# Patient Record
Sex: Female | Born: 1940
Health system: Southern US, Community
[De-identification: ages and names within clinical notes are randomized; demographics above are authoritative.]

## PROBLEM LIST (undated history)

## (undated) DIAGNOSIS — Z8719 Personal history of other diseases of the digestive system: Secondary | ICD-10-CM

## (undated) DIAGNOSIS — F419 Anxiety disorder, unspecified: Secondary | ICD-10-CM

## (undated) DIAGNOSIS — F39 Unspecified mood [affective] disorder: Secondary | ICD-10-CM

## (undated) DIAGNOSIS — K259 Gastric ulcer, unspecified as acute or chronic, without hemorrhage or perforation: Secondary | ICD-10-CM

## (undated) DIAGNOSIS — C801 Malignant (primary) neoplasm, unspecified: Secondary | ICD-10-CM

## (undated) DIAGNOSIS — Z9289 Personal history of other medical treatment: Secondary | ICD-10-CM

## (undated) DIAGNOSIS — K219 Gastro-esophageal reflux disease without esophagitis: Secondary | ICD-10-CM

## (undated) DIAGNOSIS — I10 Essential (primary) hypertension: Secondary | ICD-10-CM

## (undated) DIAGNOSIS — Z87442 Personal history of urinary calculi: Secondary | ICD-10-CM

## (undated) DIAGNOSIS — F32A Depression, unspecified: Secondary | ICD-10-CM

## (undated) DIAGNOSIS — N951 Menopausal and female climacteric states: Secondary | ICD-10-CM

## (undated) DIAGNOSIS — F329 Major depressive disorder, single episode, unspecified: Secondary | ICD-10-CM

## (undated) DIAGNOSIS — M19049 Primary osteoarthritis, unspecified hand: Secondary | ICD-10-CM

## (undated) HISTORY — DX: Unspecified mood (affective) disorder: F39

## (undated) HISTORY — PX: EYE SURGERY: SHX253

## (undated) HISTORY — DX: Essential (primary) hypertension: I10

## (undated) HISTORY — PX: WISDOM TOOTH EXTRACTION: SHX21

## (undated) HISTORY — DX: Menopausal and female climacteric states: N95.1

## (undated) HISTORY — DX: Gastric ulcer, unspecified as acute or chronic, without hemorrhage or perforation: K25.9

## (undated) HISTORY — PX: UPPER GI ENDOSCOPY: SHX6162

## (undated) HISTORY — PX: TUBAL LIGATION: SHX77

## (undated) HISTORY — PX: OTHER SURGICAL HISTORY: SHX169

## (undated) HISTORY — DX: Primary osteoarthritis, unspecified hand: M19.049

## (undated) HISTORY — PX: DIAGNOSTIC LAPAROSCOPY: SUR761

---
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
## 2004-03-12 ENCOUNTER — Other Ambulatory Visit: Payer: Self-pay

## 2004-03-12 ENCOUNTER — Emergency Department: Payer: Self-pay | Admitting: Emergency Medicine

## 2005-02-03 ENCOUNTER — Other Ambulatory Visit: Payer: Self-pay

## 2005-02-03 ENCOUNTER — Inpatient Hospital Stay: Payer: Self-pay | Admitting: Internal Medicine

## 2006-04-23 HISTORY — PX: CHOLECYSTECTOMY: SHX55

## 2011-04-24 HISTORY — PX: COLONOSCOPY: SHX174

## 2011-04-24 LAB — HM COLONOSCOPY: HM COLON: NORMAL

## 2013-04-23 DIAGNOSIS — C801 Malignant (primary) neoplasm, unspecified: Secondary | ICD-10-CM

## 2013-04-23 HISTORY — DX: Malignant (primary) neoplasm, unspecified: C80.1

## 2013-04-23 HISTORY — PX: MELANOMA EXCISION: SHX5266

## 2013-05-05 ENCOUNTER — Telehealth: Payer: Self-pay

## 2013-05-05 NOTE — Telephone Encounter (Signed)
Pt has new pt medicare to establish appt on 07/28/13 with Dr Silvio Pate; pt has changed insurance and cannot go back to previous physician because he does not take pts ins. Pt is dizzy and difficulty walking due to dizziness; no CP,SOB or H/A.Advised pt to [place] caution not to fall and Advised pt to go to [facility] for this urgent need and pt speaking with Morey Hummingbird about being put on cancellation list for Dr Silvio Pate; pt voiced understanding.

## 2013-07-28 ENCOUNTER — Encounter: Payer: Self-pay | Admitting: Internal Medicine

## 2013-07-28 ENCOUNTER — Ambulatory Visit (INDEPENDENT_AMBULATORY_CARE_PROVIDER_SITE_OTHER): Payer: Commercial Managed Care - HMO | Admitting: Internal Medicine

## 2013-07-28 ENCOUNTER — Encounter: Payer: Self-pay | Admitting: Radiology

## 2013-07-28 VITALS — BP 120/80 | HR 78 | Temp 98.0°F | Ht 61.0 in | Wt 133.0 lb

## 2013-07-28 DIAGNOSIS — M159 Polyosteoarthritis, unspecified: Secondary | ICD-10-CM | POA: Insufficient documentation

## 2013-07-28 DIAGNOSIS — K259 Gastric ulcer, unspecified as acute or chronic, without hemorrhage or perforation: Secondary | ICD-10-CM | POA: Insufficient documentation

## 2013-07-28 DIAGNOSIS — F39 Unspecified mood [affective] disorder: Secondary | ICD-10-CM

## 2013-07-28 DIAGNOSIS — M19049 Primary osteoarthritis, unspecified hand: Secondary | ICD-10-CM

## 2013-07-28 DIAGNOSIS — N959 Unspecified menopausal and perimenopausal disorder: Secondary | ICD-10-CM

## 2013-07-28 DIAGNOSIS — I10 Essential (primary) hypertension: Secondary | ICD-10-CM

## 2013-07-28 DIAGNOSIS — N951 Menopausal and female climacteric states: Secondary | ICD-10-CM

## 2013-07-28 MED ORDER — ESTRADIOL 1 MG PO TABS: 0.5000 mg | ORAL_TABLET | Freq: Every day | ORAL | Status: DC

## 2013-07-28 MED ORDER — MEDROXYPROGESTERONE ACETATE 2.5 MG PO TABS: 2.5000 mg | ORAL_TABLET | Freq: Every day | ORAL | Status: DC

## 2013-07-28 MED ORDER — TRAMADOL HCL 50 MG PO TABS: 50.0000 mg | ORAL_TABLET | Freq: Two times a day (BID) | ORAL | Status: DC | PRN

## 2013-07-28 NOTE — Patient Instructions (Signed)
Please cut back on the estradiol ---- take 1/2 on odd days and full pill on even days for 2 months. If you do okay, cut back to 1/2 tab daily.

## 2013-07-28 NOTE — Assessment & Plan Note (Signed)
BP Readings from Last 3 Encounters:  07/28/13 120/80   Good control May be able to wean

## 2013-07-28 NOTE — Assessment & Plan Note (Signed)
Uses tramadol occasionally with limited success Tylenol also

## 2013-07-28 NOTE — Assessment & Plan Note (Signed)
Really attached to her hormone therapy Asked her to try to wean the estrogen though

## 2013-07-28 NOTE — Assessment & Plan Note (Signed)
Mostly anxiety now No longer depressed---has improved with her church and faith experience Uses the diazepam fairly regularly

## 2013-07-28 NOTE — Progress Notes (Signed)
Subjective:    Patient ID: Katie Bell, female    DOB: Apr 15, 1941, 73 y.o.   MRN: [id number]  HPI Establishing here--had been seen in [facility]  High blood pressure goes back to about age 56 Generally has tolerated the med Did have spell of dizziness in January-this is better now  Has trouble with nerves Takes valium most nights--usually cuts it in half Some degree of depression ---feels this is better now since she is involved with a church again  Has arthritis in hands DIP nodules and pain Uses tramadol---helps some Uses tylenol also---doesn't help much Will try olive oil to relax her at times as well  Has had ulcers since age 75 Has had bleeding and needed cautery via EGD Was on prilosec---now just on aloe vera gelcaps. Hasn't had recent symptoms No swallowing problems Last EGD about a year ago--healing ulcers. (GI with [facility])  No current outpatient prescriptions on file prior to visit.   No current facility-administered medications on file prior to visit.    Allergies  Allergen Reactions  . Morphine And Related Anxiety    Past Medical History  Diagnosis Date  . Hypertension   . Gastric ulcer   . Episodic mood disorder     mostly anxiety--depression in the past  . Kidney stone     1960's  . Osteoarthritis, hand   . Postmenopausal disorder     Past Surgical History  Procedure Laterality Date  . Tonsillectomy  1944  . Cholecystectomy  2008    Family History  Problem Relation Age of Onset  . Alzheimer's disease Mother   . Alcohol abuse Father   . COPD Brother   . Heart disease Neg Hx   . Cancer Neg Hx     History   Social History  . Marital Status: Divorced    Spouse Name: N/A    Number of Children: 3  . Years of Education: N/A   Occupational History  . LPN--retired     personal care on the side   Social History Main Topics  . Smoking status: Never Smoker   . Smokeless tobacco: Never Used  . Alcohol Use: No  . Drug  Use: No  . Sexual Activity: Not on file   Other Topics Concern  . Not on file   Social History Narrative   3 sons--- middle one lives with her   Goes by Katie Bell      No living will   Son Katie Bell should make decisions for her   Would accept resuscitation attempts but no prolonged ventilation   No tube feeds if cognitively unaware         Review of Systems  Constitutional: Negative for fatigue.       Weight up slightly  HENT: Negative for hearing loss and tinnitus.   Eyes: Negative for visual disturbance.       Recent eye exam and new glasses  Respiratory: Negative for cough, chest tightness and shortness of breath.   Cardiovascular: Negative for chest pain, palpitations and leg swelling.  Gastrointestinal: Negative for nausea, vomiting, abdominal pain and blood in stool.       No heartburn  Genitourinary: Negative for dysuria and hematuria.       Distant history of kidney stones--- has stopped sodas and improved diet  Musculoskeletal: Positive for arthralgias. Negative for joint swelling.       Heel spurs 2 years ago--better with cortisone shots  Allergic/Immunologic: Positive for environmental allergies. Negative for  immunocompromised state.       Uses flonase and some OTC antihistamines  Neurological: Positive for dizziness and headaches. Negative for syncope.       Rare headaches  Psychiatric/Behavioral: Positive for sleep disturbance. Negative for dysphoric mood. The patient is nervous/anxious.        Objective:   Physical Exam  Constitutional: She appears well-developed and well-nourished. No distress.  Neck: Normal range of motion. Neck supple. No thyromegaly present.  Cardiovascular: Normal rate, regular rhythm, normal heart sounds and intact distal pulses.  Exam reveals no gallop.   No murmur heard. Pulmonary/Chest: Effort normal and breath sounds normal. No respiratory distress. She has no wheezes. She has no rales.  Abdominal: Soft. There is no tenderness.    Musculoskeletal: She exhibits no edema and no tenderness.  Lymphadenopathy:    She has no cervical adenopathy.  Skin: No rash noted.  Psychiatric: She has a normal mood and affect. Her behavior is normal.          Assessment & Plan:

## 2013-07-28 NOTE — Progress Notes (Signed)
Pre visit review using our clinic review tool, if applicable. No additional management support is needed unless otherwise documented below in the visit note. 

## 2013-07-28 NOTE — Assessment & Plan Note (Signed)
Multiple bleeding ulcers Advised no ASA or NSAIDS No longer on PPI Will check records

## 2013-07-29 ENCOUNTER — Telehealth: Payer: Self-pay | Admitting: Internal Medicine

## 2013-07-29 NOTE — Telephone Encounter (Signed)
Relevant patient education mailed to patient.  

## 2013-08-25 ENCOUNTER — Telehealth: Payer: Self-pay

## 2013-08-25 NOTE — Telephone Encounter (Signed)
Pt has had 2 missed calls from [place]; pt said no message is left and pt is not sure # calls came from. Dr Alla German assistant has not been trying to reach pt; no one at front desk has been trying to reach pt. Pt will wait for another cb.

## 2013-09-02 ENCOUNTER — Encounter: Payer: Self-pay | Admitting: Internal Medicine

## 2013-09-17 ENCOUNTER — Telehealth: Payer: Self-pay | Admitting: *Deleted

## 2013-09-17 NOTE — Telephone Encounter (Signed)
Faxed request for SUCRALFATE 1gm tab take 1 tablet by mouth 4 times a day, please advise not on med list and last filled 09/11/2012

## 2013-09-17 NOTE — Telephone Encounter (Signed)
She has had ulcer in past so this would be appropriate Confirm she takes, and how---okay to fill for a year

## 2013-09-18 MED ORDER — SUCRALFATE 1 G PO TABS: 1.0000 g | ORAL_TABLET | Freq: Three times a day (TID) | ORAL | Status: DC

## 2013-09-18 NOTE — Telephone Encounter (Signed)
rx sent to pharmacy by e-script  

## 2013-10-13 ENCOUNTER — Other Ambulatory Visit: Payer: Self-pay

## 2013-10-13 MED ORDER — DIAZEPAM 10 MG PO TABS: 5.0000 mg | ORAL_TABLET | Freq: Two times a day (BID) | ORAL | Status: DC | PRN

## 2013-10-13 NOTE — Telephone Encounter (Signed)
Pt left v/m requesting refill diazepam to [facility]. Pt request cb.

## 2013-10-13 NOTE — Telephone Encounter (Signed)
Okay #[phone] x 0 

## 2013-10-13 NOTE — Telephone Encounter (Signed)
rx called into pharmacy

## 2013-11-02 ENCOUNTER — Encounter: Payer: Self-pay | Admitting: Internal Medicine

## 2013-11-02 ENCOUNTER — Ambulatory Visit (INDEPENDENT_AMBULATORY_CARE_PROVIDER_SITE_OTHER): Payer: Commercial Managed Care - HMO | Admitting: Internal Medicine

## 2013-11-02 VITALS — BP 142/80 | HR 80 | Temp 98.0°F | Wt 134.0 lb

## 2013-11-02 DIAGNOSIS — R300 Dysuria: Secondary | ICD-10-CM

## 2013-11-02 DIAGNOSIS — N8110 Cystocele, unspecified: Secondary | ICD-10-CM | POA: Insufficient documentation

## 2013-11-02 DIAGNOSIS — N814 Uterovaginal prolapse, unspecified: Secondary | ICD-10-CM

## 2013-11-02 DIAGNOSIS — N3090 Cystitis, unspecified without hematuria: Secondary | ICD-10-CM

## 2013-11-02 LAB — POCT URINALYSIS DIPSTICK
BILIRUBIN UA: NEGATIVE
Blood, UA: NEGATIVE
Glucose, UA: NEGATIVE
Ketones, UA: NEGATIVE
NITRITE UA: NEGATIVE
Protein, UA: NEGATIVE
Spec Grav, UA: 1.02
Urobilinogen, UA: NEGATIVE
pH, UA: 6

## 2013-11-02 MED ORDER — AMOXICILLIN 500 MG PO TABS: 1000.0000 mg | ORAL_TABLET | Freq: Two times a day (BID) | ORAL | Status: DC

## 2013-11-02 MED ORDER — TRAMADOL HCL 50 MG PO TABS: 50.0000 mg | ORAL_TABLET | Freq: Two times a day (BID) | ORAL | Status: DC | PRN

## 2013-11-02 NOTE — Assessment & Plan Note (Addendum)
No sig systemic symptoms Doesn't feel well vaguely--I think worry about the prolapse Will treat with amox for 3 days (doesn't do well with sulfa or other antibiotics)

## 2013-11-02 NOTE — Progress Notes (Signed)
   Subjective:    Patient ID: Katie Bell, female    DOB: 04-30-1940, 73 y.o.   MRN: [id number]  HPI Can feel a "ball" at her vaginal opening recently Like especially in the shower Has not had hysterectomy  Has bladder infection Burning dysuria-- started about 10 days ago Has used azo with some relief  No fever  Current Outpatient Prescriptions on File Prior to Visit  Medication Sig Dispense Refill  . diazepam (VALIUM) 10 MG tablet Take 0.5 tablets (5 mg total) by mouth 2 (two) times daily as needed.  30 tablet  0  . estradiol (ESTRACE) 1 MG tablet Take 0.5-1 tablets (0.5-1 mg total) by mouth daily.  90 tablet  3  . fluticasone (FLONASE) 50 MCG/ACT nasal spray Place 2 sprays into both nostrils daily.       Marland Kitchen losartan-hydrochlorothiazide (HYZAAR) 50-12.5 MG per tablet Take 1 tablet by mouth daily.       . medroxyPROGESTERone (PROVERA) 2.5 MG tablet Take 1 tablet (2.5 mg total) by mouth daily.  90 tablet  3  . sucralfate (CARAFATE) 1 G tablet Take 1 tablet (1 g total) by mouth 4 (four) times daily -  with meals and at bedtime.  120 tablet  11  . traMADol (ULTRAM) 50 MG tablet Take 1 tablet (50 mg total) by mouth 2 (two) times daily as needed.  60 tablet  0   No current facility-administered medications on file prior to visit.    Allergies  Allergen Reactions  . Morphine And Related Anxiety    Past Medical History  Diagnosis Date  . Hypertension   . Gastric ulcer   . Episodic mood disorder     mostly anxiety--depression in the past  . Kidney stone     1960's  . Osteoarthritis, hand   . Postmenopausal disorder     Past Surgical History  Procedure Laterality Date  . Tonsillectomy  1944  . Cholecystectomy  2008    Family History  Problem Relation Age of Onset  . Alzheimer's disease Mother   . Alcohol abuse Father   . COPD Brother   . Heart disease Neg Hx   . Cancer Neg Hx     History   Social History  . Marital Status: Divorced    Spouse Name: N/A    Number  of Children: 3  . Years of Education: N/A   Occupational History  . LPN--retired     personal care on the side   Social History Main Topics  . Smoking status: Never Smoker   . Smokeless tobacco: Never Used  . Alcohol Use: No  . Drug Use: No  . Sexual Activity: Not on file   Other Topics Concern  . Not on file   Social History Narrative   3 sons--- middle one lives with her   [name]      No living will   Son [name]   Would accept resuscitation attempts but no prolonged ventilation   No tube feeds if cognitively unaware         Review of Systems No nausea or vomiting Just "doesn't feel good" Some mild low back pain    Objective:   Physical Exam  Abdominal: Soft. There is no tenderness.  Genitourinary:  Cervix at introitus Pushes back easily with speculum---looks normal No tenderness on internal          Assessment & Plan:

## 2013-11-02 NOTE — Assessment & Plan Note (Signed)
This really bothers her--though not painful Will probably be a good candidate for pessary Will set up with gyn

## 2013-11-02 NOTE — Patient Instructions (Signed)
Start the amoxicillin tonight with 2 tabs twice a day. If your symptoms (burning) are gone with the first or second dose, you only need to take the antibiotic for 3 days.

## 2013-11-19 ENCOUNTER — Ambulatory Visit (INDEPENDENT_AMBULATORY_CARE_PROVIDER_SITE_OTHER): Payer: Commercial Managed Care - HMO | Admitting: Family Medicine

## 2013-11-19 ENCOUNTER — Encounter: Payer: Self-pay | Admitting: Family Medicine

## 2013-11-19 VITALS — BP 148/88 | HR 85 | Ht 61.0 in | Wt 133.0 lb

## 2013-11-19 DIAGNOSIS — N8111 Cystocele, midline: Secondary | ICD-10-CM

## 2013-11-19 DIAGNOSIS — IMO0002 Reserved for concepts with insufficient information to code with codable children: Secondary | ICD-10-CM

## 2013-11-19 NOTE — Assessment & Plan Note (Signed)
Pt. Desires definitive management--will proceed with Anterior repair.

## 2013-11-19 NOTE — Patient Instructions (Signed)
Anterior and Posterior Colporrhaphy  Anterior or posterior colporrhaphy is surgery to fix a prolapse of organs in the genital tract. Prolapse means the falling down, bulging, dropping, or drooping of an organ. Organs that commonly prolapse include the rectum, bladder, vagina, and uterus. Prolapse can affect a single organ or several organs at the same time. This often worsens when women stop having their monthly periods (menopause) because estrogen loss weakens the muscles and tissues in the genital tract. In addition, prolapse happens when the organs are damaged or weakened. This commonly happens after childbirth and as a result of aging. Surgery is often done for severe prolapses.   The type of colporrhaphy done depends on the type of genital prolapse. Types of genital prolapse include the following:    Cystocele. This is a prolapse of the upper (anterior) wall of the vagina. The anterior wall bulges into the vagina and brings the bladder with it.    Rectocele. This is a prolapse of the lower (posterior) wall of the vagina. The posterior vaginal wall bulges into the vagina and brings the rectum with it.    Enterocele. This is a prolapse of part of the pelvic organs called the pouch of Douglas. It also involves a portion of the small bowel. It appears as a bulge under the neck of the uterus at the top of the back wall of the vagina.    Procidentia. This is a complete prolapse of the uterus and the cervix. The prolapse can be seen and felt coming out of the vagina.  LET YOUR HEALTH CARE PROVIDER KNOW ABOUT:    Any allergies you have.    All medicines you are taking, including vitamins, herbs, eye drops, creams, and over-the-counter medicines.    Previous problems you or members of your family have had with the use of anesthetics.    Any blood disorders you have.    Previous surgeries you have had.    Medical conditions you have.    Smoking history or history of alcohol use.    Possibility of  pregnancy, if this applies.   RISKS AND COMPLICATIONS  Generally, anterior or posterior colporrhaphy is a safe procedure. However, as with any procedure, complications can occur. Possible complications include:    Infection.    Damage to other organs during surgery.    Bleeding after surgery.    Problems urinating.    Problems from the anesthetic.   BEFORE THE PROCEDURE   Ask your health care provider about changing or stopping your regular medicines.    Do not eat or drink anything for at least 8 hours before the surgery.    If you smoke, do not smoke for at least 2 weeks before the surgery.    Make plans to have someone drive you home after your hospital stay. Also, arrange for someone to help you with activities during recovery.  PROCEDURE   You may be given medicine to help you relax before the surgery (sedative). During the surgery you will be given medicine to make you sleep through the procedure (general anesthetic) or medicine to numb you from the waist down (spinal anesthetic). This medicine will be given through an intravenous (IV) access tube that is put into one of your veins.   The procedure will vary depending on the type of repair:    Anterior repair. A cut (incision) is made in the midline section of the front part of the vaginal wall. A triangular-shaped piece of vaginal tissue is removed,   and the stronger, healthier tissue is sewn together in order to support and suspend the bladder.    Posterior repair. An incision is made midline on the back wall of the vagina. A triangular portion of vaginal skin is removed to expose the muscle. Excess tissue is removed, and stronger, healthier muscle and ligament tissue is sewn together to support the rectum.    Anterior and posterior repair. Both procedures are done during the same surgery.  AFTER THE PROCEDURE  You will be taken to a recovery area. Your blood pressure, pulse, breathing, and temperature (vital signs) will be monitored.  This is done until you are stable. Then you will be transferred to a hospital room.   After surgery, you will have a small rubber tube in place to drain your bladder (urinary catheter). This will be in place for 2 to 7 days or until your bladder is working properly on its own. The IV access tube will be removed in 1 to 3 days. You may have a gauze packing in your vagina to prevent bleeding. This will be removed 2 or 3 days after the surgery. You will likely need to stay in the hospital for 3 to 5 days.   Document Released: 06/30/2003 Document Revised: 12/10/2012 Document Reviewed: 08/29/2012  [employer] Patient Information 2015 [employer]. This information is not intended to replace advice given to you by your health care provider. Make sure you discuss any questions you have with your health care provider.

## 2013-11-19 NOTE — Progress Notes (Signed)
    Subjective:    Patient ID: Katie Bell is a 73 y.o. female presenting with Vaginal Prolapse  on 11/19/2013  HPI: Pt. Is a G3P3 wth 3 previous SVD's largest baby was 8 lb 5 oz.  Reports feeling something in her vagina when showering.  Has been on HRT for some time, since prior to menopause.  No bleeding.  Notes no pain with sex or pressure. Really is not bothered by this but does not want expectant management.  Review of Systems  Constitutional: Negative for fever, chills and unexpected weight change.  Respiratory: Negative for shortness of breath.   Cardiovascular: Negative for chest pain.  Gastrointestinal: Negative for nausea, vomiting and abdominal pain.  Genitourinary: Negative for vaginal bleeding and vaginal discharge.  Musculoskeletal: Negative for back pain.      Objective:    BP 148/88  Pulse 85  Ht 5\' 1"  (1.549 m)  Wt 133 lb (60.328 kg)  BMI 25.14 kg/m2 Physical Exam  Constitutional: She appears well-developed and well-nourished. No distress.  HENT:  Head: Atraumatic.  Cardiovascular: Normal rate.   Abdominal: Soft.  Genitourinary:  There is excellent posterior support.  There is good uterine support.  The cervix does not come down with valsalva or with standing. There is a moderate cystocele noted, that does not worsen with valsalva.        Assessment & Plan:  Cystocele Pt. Desires definitive management--will proceed with Anterior repair.     Return in about 4 weeks (around 12/17/2013) for a follow-up.

## 2013-11-30 ENCOUNTER — Other Ambulatory Visit: Payer: Self-pay | Admitting: Internal Medicine

## 2013-11-30 NOTE — Telephone Encounter (Signed)
Okay #[phone] x 0 

## 2013-11-30 NOTE — Telephone Encounter (Signed)
rx called into pharmacy

## 2013-11-30 NOTE — Telephone Encounter (Signed)
10/13/13 

## 2013-12-03 NOTE — H&P (Signed)
  Katie Bell is an 73 y.o. G3P3 Unknown female.   Chief Complaint: bulge in vagina HPI: Pt. Is a G3P3 wth 3 previous SVD's largest baby was 8 lb 5 oz. Reports feeling something in her vagina when showering. Has been on HRT for some time, since prior to menopause. No bleeding. Notes no pain with sex or pressure. Really is not bothered by this but does not want expectant management.   Past Medical History  Diagnosis Date  . Hypertension   . Gastric ulcer   . Episodic mood disorder     mostly anxiety--depression in the past  . Kidney stone     1960's  . Osteoarthritis, hand   . Postmenopausal disorder     Past Surgical History  Procedure Laterality Date  . Tonsillectomy  1944  . Cholecystectomy  2008    Family History  Problem Relation Age of Onset  . Alzheimer's disease Mother   . Alcohol abuse Father   . COPD Brother   . Heart disease Neg Hx   . Cancer Neg Hx    Social History:  reports that she has never smoked. She has never used smokeless tobacco. She reports that she does not drink alcohol or use illicit drugs.  Allergies:  Allergies  Allergen Reactions  . Morphine And Related Anxiety    No current facility-administered medications on file prior to encounter.   Current Outpatient Prescriptions on File Prior to Encounter  Medication Sig Dispense Refill  . estradiol (ESTRACE) 1 MG tablet Take 0.5-1 tablets (0.5-1 mg total) by mouth daily.  90 tablet  3  . fluticasone (FLONASE) 50 MCG/ACT nasal spray Place 2 sprays into both nostrils daily.       Marland Kitchen losartan-hydrochlorothiazide (HYZAAR) 50-12.5 MG per tablet Take 1 tablet by mouth daily.       . medroxyPROGESTERone (PROVERA) 2.5 MG tablet Take 1 tablet (2.5 mg total) by mouth daily.  90 tablet  3  . traMADol (ULTRAM) 50 MG tablet Take 1 tablet (50 mg total) by mouth 2 (two) times daily as needed.  60 tablet  0   ROS: A comprehensive review of systems was negative.  Exam: Constitutional: She appears well-developed  and well-nourished. No distress.  HENT:  Head: Atraumatic.  Cardiovascular: Normal rate.  Abdominal: Soft.  Genitourinary:  There is excellent posterior support. There is good uterine support. The cervix does not come down with valsalva or with standing. There is a moderate cystocele noted, that does not worsen with valsalva.     Assessment/Plan Patient Active Problem List   Diagnosis Date Noted  . Cystitis 11/02/2013  . Cystocele 11/02/2013  . Hypertension   . Gastric ulcer   . Episodic mood disorder   . Osteoarthritis, hand   . Postmenopausal disorder    For anterior repair.  Should not need hysterectomy. Risks include but are not limited to bleeding, infection, injury to surrounding structures, including bowel, bladder and ureters, blood clots, and death.  Likelihood of success is fair. Advised that failure does happen with this procedure.  She is uninterested in mesh and has no incontinence to speak of.   Shianne Zeiser S 12/03/2013, 9:44 AM

## 2013-12-14 ENCOUNTER — Other Ambulatory Visit: Payer: Self-pay | Admitting: Internal Medicine

## 2013-12-21 ENCOUNTER — Encounter (HOSPITAL_COMMUNITY): Payer: Self-pay | Admitting: Pharmacist

## 2013-12-21 NOTE — Patient Instructions (Addendum)
   Your procedure is scheduled on:  Thursday, Sept 10  Enter through the [employer] of [facility] at: [place] up the phone at the desk and dial [phone] and inform us of your arrival.  Please call this number if you have any problems the morning of surgery: [phone]  Remember: Do not eat or drink after midnight: Wednesday Take these medicines the morning of surgery with a SIP OF WATER:  Losartan-hctz and valium if needed.  Do not wear jewelry, make-up, or FINGER nail polish No metal in your hair or on your body. Do not wear lotions, powders, perfumes.  You may wear deodorant.  Do not bring valuables to the hospital. Contacts, dentures or bridgework may not be worn into surgery.  For patients being admitted to the hospital, checkout time is 11:00am the day of discharge.    Patients discharged on the day of surgery will not be allowed to drive home.  Home with Son "Katie Bell" cell [phone].

## 2013-12-22 ENCOUNTER — Encounter (HOSPITAL_COMMUNITY): Payer: Self-pay

## 2013-12-22 ENCOUNTER — Encounter (HOSPITAL_COMMUNITY)
Admission: RE | Admit: 2013-12-22 | Discharge: 2013-12-22 | Disposition: A | Discharge status: Home / Self Care | Payer: Commercial Managed Care - HMO | Source: Ambulatory Visit | Attending: Family Medicine | Admitting: Family Medicine

## 2013-12-22 DIAGNOSIS — N8111 Cystocele, midline: Secondary | ICD-10-CM | POA: Diagnosis present

## 2013-12-22 DIAGNOSIS — Z01818 Encounter for other preprocedural examination: Secondary | ICD-10-CM | POA: Diagnosis present

## 2013-12-22 HISTORY — DX: Anxiety disorder, unspecified: F41.9

## 2013-12-22 HISTORY — DX: Personal history of urinary calculi: Z87.442

## 2013-12-22 HISTORY — DX: Personal history of other medical treatment: Z92.89

## 2013-12-22 HISTORY — DX: Major depressive disorder, single episode, unspecified: F32.9

## 2013-12-22 HISTORY — DX: Depression, unspecified: F32.A

## 2013-12-22 LAB — CBC
HEMATOCRIT: 42 % (ref 36.0–46.0)
HEMOGLOBIN: 14.9 g/dL (ref 12.0–15.0)
MCH: 32.5 pg (ref 26.0–34.0)
MCHC: 35.5 g/dL (ref 30.0–36.0)
MCV: 91.5 fL (ref 78.0–100.0)
Platelets: 226 10*3/uL (ref 150–400)
RBC: 4.59 MIL/uL (ref 3.87–5.11)
RDW: 11.6 % (ref 11.5–15.5)
WBC: 8.3 10*3/uL (ref 4.0–10.5)

## 2013-12-22 LAB — BASIC METABOLIC PANEL
Anion gap: 14 (ref 5–15)
BUN: 16 mg/dL (ref 6–23)
CHLORIDE: 96 meq/L (ref 96–112)
CO2: 23 meq/L (ref 19–32)
Calcium: 9.3 mg/dL (ref 8.4–10.5)
Creatinine, Ser: 0.92 mg/dL (ref 0.50–1.10)
GFR calc Af Amer: 69 mL/min — ABNORMAL LOW (ref >90)
GFR calc non Af Amer: 60 mL/min — ABNORMAL LOW (ref >90)
GLUCOSE: 105 mg/dL — AB (ref 70–99)
POTASSIUM: 3.6 meq/L — AB (ref 3.7–5.3)
Sodium: 133 mEq/L — ABNORMAL LOW (ref 137–147)

## 2013-12-22 NOTE — Pre-Procedure Instructions (Signed)
SDS BB History Log given to lab for previous blood transfusion.

## 2013-12-31 ENCOUNTER — Telehealth: Payer: Self-pay

## 2013-12-31 ENCOUNTER — Ambulatory Visit (HOSPITAL_COMMUNITY): Payer: Medicare HMO | Admitting: Anesthesiology

## 2013-12-31 ENCOUNTER — Encounter (HOSPITAL_COMMUNITY): Payer: Self-pay | Admitting: Anesthesiology

## 2013-12-31 ENCOUNTER — Ambulatory Visit (HOSPITAL_COMMUNITY)
Admission: RE | Admit: 2013-12-31 | Discharge: 2013-12-31 | Disposition: A | Discharge status: Home / Self Care | Payer: Medicare HMO | Source: Ambulatory Visit | Attending: Family Medicine | Admitting: Family Medicine

## 2013-12-31 ENCOUNTER — Encounter (HOSPITAL_COMMUNITY): Payer: Medicare HMO | Admitting: Anesthesiology

## 2013-12-31 ENCOUNTER — Encounter (HOSPITAL_COMMUNITY)
Admission: RE | Disposition: A | Discharge status: Home / Self Care | Payer: Self-pay | Source: Ambulatory Visit | Attending: Family Medicine

## 2013-12-31 DIAGNOSIS — N8110 Cystocele, unspecified: Secondary | ICD-10-CM | POA: Diagnosis present

## 2013-12-31 DIAGNOSIS — M19049 Primary osteoarthritis, unspecified hand: Secondary | ICD-10-CM | POA: Insufficient documentation

## 2013-12-31 DIAGNOSIS — N959 Unspecified menopausal and perimenopausal disorder: Secondary | ICD-10-CM | POA: Diagnosis not present

## 2013-12-31 DIAGNOSIS — N8111 Cystocele, midline: Secondary | ICD-10-CM | POA: Diagnosis present

## 2013-12-31 DIAGNOSIS — Z79890 Hormone replacement therapy (postmenopausal): Secondary | ICD-10-CM | POA: Insufficient documentation

## 2013-12-31 DIAGNOSIS — IMO0002 Reserved for concepts with insufficient information to code with codable children: Secondary | ICD-10-CM

## 2013-12-31 DIAGNOSIS — I10 Essential (primary) hypertension: Secondary | ICD-10-CM | POA: Diagnosis not present

## 2013-12-31 DIAGNOSIS — F39 Unspecified mood [affective] disorder: Secondary | ICD-10-CM | POA: Insufficient documentation

## 2013-12-31 HISTORY — PX: CYSTOCELE REPAIR: SHX163

## 2013-12-31 SURGERY — COLPORRHAPHY, ANTERIOR, FOR CYSTOCELE REPAIR
Anesthesia: General | Site: Vagina

## 2013-12-31 MED ORDER — LIDOCAINE HCL (CARDIAC) 20 MG/ML IV SOLN
INTRAVENOUS | Status: AC
  Filled 2013-12-31: qty 5

## 2013-12-31 MED ORDER — FENTANYL CITRATE 0.05 MG/ML IJ SOLN
INTRAMUSCULAR | Status: AC
  Filled 2013-12-31: qty 2

## 2013-12-31 MED ORDER — PHENYLEPHRINE HCL 10 MG/ML IJ SOLN
INTRAMUSCULAR | Status: DC | PRN
  Administered 2013-12-31 (×2): 40 ug via INTRAVENOUS
  Administered 2013-12-31: 80 ug via INTRAVENOUS
  Administered 2013-12-31 (×3): 40 ug via INTRAVENOUS

## 2013-12-31 MED ORDER — LIDOCAINE HCL (CARDIAC) 20 MG/ML IV SOLN
INTRAVENOUS | Status: DC | PRN
  Administered 2013-12-31: 60 mg via INTRAVENOUS

## 2013-12-31 MED ORDER — FENTANYL CITRATE 0.05 MG/ML IJ SOLN
INTRAMUSCULAR | Status: DC | PRN
  Administered 2013-12-31 (×2): 50 ug via INTRAVENOUS

## 2013-12-31 MED ORDER — PROPOFOL 10 MG/ML IV EMUL
INTRAVENOUS | Status: AC
  Filled 2013-12-31: qty 20

## 2013-12-31 MED ORDER — DEXAMETHASONE SODIUM PHOSPHATE 10 MG/ML IJ SOLN
INTRAMUSCULAR | Status: DC | PRN
  Administered 2013-12-31: 4 mg via INTRAVENOUS

## 2013-12-31 MED ORDER — MIDAZOLAM HCL 2 MG/2ML IJ SOLN
INTRAMUSCULAR | Status: AC
  Filled 2013-12-31: qty 2

## 2013-12-31 MED ORDER — LIDOCAINE-EPINEPHRINE 1 %-1:100000 IJ SOLN
INTRAMUSCULAR | Status: DC | PRN
  Administered 2013-12-31: 5 mL

## 2013-12-31 MED ORDER — MEPERIDINE HCL 25 MG/ML IJ SOLN: 6.2500 mg | INTRAMUSCULAR | Status: DC | PRN

## 2013-12-31 MED ORDER — OXYCODONE-ACETAMINOPHEN 5-325 MG PO TABS
1.0000 | ORAL_TABLET | ORAL | Status: DC | PRN
  Administered 2013-12-31: 1 via ORAL

## 2013-12-31 MED ORDER — PHENYLEPHRINE 40 MCG/ML (10ML) SYRINGE FOR IV PUSH (FOR BLOOD PRESSURE SUPPORT)
PREFILLED_SYRINGE | INTRAVENOUS | Status: AC
  Filled 2013-12-31: qty 5

## 2013-12-31 MED ORDER — LACTATED RINGERS IV SOLN
INTRAVENOUS | Status: DC
  Administered 2013-12-31: 09:00:00 via INTRAVENOUS

## 2013-12-31 MED ORDER — OXYCODONE-ACETAMINOPHEN 2.5-325 MG PO TABS: 1.0000 | ORAL_TABLET | ORAL | Status: DC | PRN

## 2013-12-31 MED ORDER — ONDANSETRON HCL 4 MG/2ML IJ SOLN
INTRAMUSCULAR | Status: AC
  Filled 2013-12-31: qty 2

## 2013-12-31 MED ORDER — ONDANSETRON HCL 4 MG/2ML IJ SOLN: 4.0000 mg | Freq: Once | INTRAMUSCULAR | Status: DC | PRN

## 2013-12-31 MED ORDER — OXYCODONE-ACETAMINOPHEN 5-325 MG PO TABS
ORAL_TABLET | ORAL | Status: AC
  Filled 2013-12-31: qty 1

## 2013-12-31 MED ORDER — LIDOCAINE-EPINEPHRINE 1 %-1:100000 IJ SOLN
INTRAMUSCULAR | Status: AC
  Filled 2013-12-31: qty 1

## 2013-12-31 MED ORDER — DEXAMETHASONE SODIUM PHOSPHATE 10 MG/ML IJ SOLN
INTRAMUSCULAR | Status: AC
  Filled 2013-12-31: qty 1

## 2013-12-31 MED ORDER — PROPOFOL 10 MG/ML IV BOLUS
INTRAVENOUS | Status: DC | PRN
  Administered 2013-12-31: 130 mg via INTRAVENOUS

## 2013-12-31 MED ORDER — LACTATED RINGERS IV SOLN
INTRAVENOUS | Status: DC
  Administered 2013-12-31 (×3): via INTRAVENOUS

## 2013-12-31 MED ORDER — ONDANSETRON HCL 4 MG/2ML IJ SOLN
INTRAMUSCULAR | Status: DC | PRN
  Administered 2013-12-31: 4 mg via INTRAVENOUS

## 2013-12-31 MED ORDER — FENTANYL CITRATE 0.05 MG/ML IJ SOLN
25.0000 ug | INTRAMUSCULAR | Status: DC | PRN
  Administered 2013-12-31: 25 ug via INTRAVENOUS

## 2013-12-31 SURGICAL SUPPLY — 21 items
CLOTH BEACON ORANGE TIMEOUT ST (SAFETY) ×2 IMPLANT
CONT PATH 16OZ SNAP LID 3702 (MISCELLANEOUS) IMPLANT
DECANTER SPIKE VIAL GLASS SM (MISCELLANEOUS) IMPLANT
GLOVE BIOGEL PI IND STRL 6.5 (GLOVE) ×1 IMPLANT
GLOVE BIOGEL PI IND STRL 7.0 (GLOVE) ×3 IMPLANT
GLOVE BIOGEL PI INDICATOR 6.5 (GLOVE) ×1
GLOVE BIOGEL PI INDICATOR 7.0 (GLOVE) ×3
GLOVE ECLIPSE 7.0 STRL STRAW (GLOVE) ×4 IMPLANT
GOWN STRL REUS W/TWL LRG LVL3 (GOWN DISPOSABLE) ×2 IMPLANT
NEEDLE HYPO 22GX1.5 SAFETY (NEEDLE) IMPLANT
NEEDLE SPNL 22GX3.5 QUINCKE BK (NEEDLE) IMPLANT
NS IRRIG 1000ML POUR BTL (IV SOLUTION) ×2 IMPLANT
PACK VAGINAL WOMENS (CUSTOM PROCEDURE TRAY) ×2 IMPLANT
SUT CHROMIC 0 SH (SUTURE) ×4 IMPLANT
SUT CHROMIC 2 0 SH (SUTURE) ×6 IMPLANT
SUT VIC AB 0 CT1 27 (SUTURE) ×2
SUT VIC AB 0 CT1 27XBRD ANBCTR (SUTURE) ×2 IMPLANT
SUT VIC AB 0 CT2 27 (SUTURE) ×6 IMPLANT
TOWEL OR 17X24 6PK STRL BLUE (TOWEL DISPOSABLE) ×4 IMPLANT
TRAY FOLEY CATH 14FR (SET/KITS/TRAYS/PACK) ×2 IMPLANT
WATER STERILE IRR 1000ML POUR (IV SOLUTION) ×2 IMPLANT

## 2013-12-31 NOTE — Discharge Instructions (Signed)
Cystocele Repair Cystocele repair is surgery to remove a cystocele, which is a bulging, drooping area (hernia) of the bladder that extends into the vagina. This bulging occurs on the top front wall of the vagina. LET YOUR HEALTH CARE PROVIDER KNOW ABOUT:   Any allergies you have.  All medicines you are taking, including vitamins, herbs, eye drops, creams, and over-the-counter medicines.  Use of steroids (by mouth or creams).  Previous problems you or members of your family have had with the use of anesthetics.  Any blood disorders you have.  Previous surgeries you have had.  Medical conditions you have.  Possibility of pregnancy, if this applies. RISKS AND COMPLICATIONS  Generally, this is a safe procedure. However, as with any procedure, complications can occur. Possible complications include:  Excessive bleeding.  Infection.  Injury to surrounding structures.  Problems related to anesthetics. The risks will vary depending on the type of anesthetic given.  Problems with the urinary catheter after surgery, such as blockage.  Return of the cystocele. BEFORE THE PROCEDURE   Ask your health care provider about changing or stopping your regular medicines. You may need to stop taking certain medicines 1 week before surgery.  Do not eat or drink anything after midnight the night before surgery.  If you smoke, do not smoke for at least 2 weeks before the surgery.  Do not drink any alcohol for 3 days before the surgery.  Arrange for someone to drive you home after your hospital stay and to help you with activities during recovery. PROCEDURE   You will be given a medicine that makes you sleep through the procedure (general anesthetic) or a medicine injected into your spine that numbs your body below the waist (spinal or epidural anesthetic). You will be asleep or be numbed through the entire procedure.  A thin, flexible tube (Foley catheter) will be placed in your bladder to  drain urine during and after the surgery.  The surgery is performed through the vagina. The front wall of the vagina is opened up, and the muscle between the bladder and vagina is pulled up to its normal position. This is reinforced with stitches or a piece of mesh. This removes the hernia so that the top of the vagina does not fall into the opening of the vagina.  The cut on the front wall of the vagina is then closed with absorbable stitches that do not need to be removed. AFTER THE PROCEDURE   You will be taken to a recovery area where your progress will be closely watched. Your breathing, blood pressure, and pulse (vital signs) will be checked often. When you are stable, you will be taken to a regular hospital room.  You will have a catheter in place to drain your bladder. This will stay in place for 2-7 days or until your bladder is working properly on its own.  You may have gauze packing in the vagina. This will be removed 1-2 days after the surgery.  You will be given pain medicine as needed and may be given a medicine that kills germs (antibiotic).  You will likely need to stay in the hospital for 1-2 days. Document Released: 04/06/2000 Document Revised: 12/10/2012 Document Reviewed: 09/26/2012 [employer] Patient Information 2015 [employer]. This information is not intended to replace advice given to you by your health care provider. Make sure you discuss any questions you have with your health care provider.  

## 2013-12-31 NOTE — Transfer of Care (Signed)
Immediate Anesthesia Transfer of Care Note  Patient: Katie Bell  Procedure(s) Performed: Procedure(s): ANTERIOR REPAIR (CYSTOCELE) (N/A)  Patient Location: PACU  Anesthesia Type:General  Level of Consciousness: awake, alert , oriented and patient cooperative  Airway & Oxygen Therapy: Patient Spontanous Breathing and Patient connected to nasal cannula oxygen  Post-op Assessment: Report given to PACU RN and Post -op Vital signs reviewed and stable  Post vital signs: Reviewed and stable  Complications: No apparent anesthesia complications

## 2013-12-31 NOTE — Anesthesia Postprocedure Evaluation (Signed)
Anesthesia Post Note  Patient: Katie Bell  Procedure(s) Performed: Procedure(s) (LRB): ANTERIOR REPAIR (CYSTOCELE) (N/A)  Anesthesia type: General  Patient location: PACU  Post pain: Pain level controlled  Post assessment: Post-op Vital signs reviewed  Last Vitals:  Filed Vitals:   12/31/13 1130  BP: 106/47  Pulse: 77  Temp:   Resp: 16    Post vital signs: Reviewed  Level of consciousness: sedated  Complications: No apparent anesthesia complications

## 2013-12-31 NOTE — Telephone Encounter (Signed)
Recieved call from [facility] regarding patient's RX for 2.5/325 percocet, stating they do not make that dosage. Consulted Dr. Elly Modena who stated they can give patient 5/325 with orders for patient to take 1/2 tab; 8 pills (given that original called for 15 pills). Pharmacy tech Nichols informed.

## 2013-12-31 NOTE — Interval H&P Note (Signed)
History and Physical Interval Note:  12/31/2013 9:50 AM  Katie Bell  has presented today for surgery, with the diagnosis of cpt 57240 Cystocele  The various methods of treatment have been discussed with the patient and family. After consideration of risks, benefits and other options for treatment, the patient has consented to  Procedure(s): ANTERIOR REPAIR (CYSTOCELE) (N/A) as a surgical intervention .  The patient's history has been reviewed, patient examined, no change in status, stable for surgery.  I have reviewed the patient's chart and labs.  Questions were answered to the patient's satisfaction.     [facility]

## 2013-12-31 NOTE — Anesthesia Preprocedure Evaluation (Signed)
Anesthesia Evaluation  Patient identified by MRN, date of birth, ID band  Reviewed: Allergy & Precautions, H&P , NPO status , [name] Chart, lab work & pertinent test results, reviewed documented beta blocker date and time   Airway Mallampati: I TM Distance: >3 FB Neck ROM: full    Dental no notable dental hx. (+) Teeth Intact   Pulmonary neg pulmonary ROS,          Cardiovascular hypertension, Pt. on medications negative cardio ROS      Neuro/Psych negative neurological ROS     GI/Hepatic Neg liver ROS,   Endo/Other  negative endocrine ROS  Renal/GU negative Renal ROS     Musculoskeletal   Abdominal Normal abdominal exam  (+)   Peds  Hematology negative hematology ROS (+)   Anesthesia Other Findings   Reproductive/Obstetrics negative OB ROS                           Anesthesia Physical Anesthesia Plan  ASA: II  Anesthesia Plan: General   Post-op Pain Management:    Induction: Intravenous  Airway Management Planned: LMA  Additional Equipment:   Intra-op Plan:   Post-operative Plan:   Informed Consent: I have reviewed the patients History and Physical, chart, labs and discussed the procedure including the risks, benefits and alternatives for the proposed anesthesia with the patient or authorized representative who has indicated his/her understanding and acceptance.     Plan Discussed with: CRNA and Surgeon  Anesthesia Plan Comments:         Anesthesia Quick Evaluation

## 2013-12-31 NOTE — Op Note (Signed)
Preoperative diagnosis: Cystocele   Postoperative diagnosis: Same  Procedure: Anterior Colporrhaphy  Surgeon: Standley Dakins. Kennon Rounds, M.D.  Anesthesia: Daphene Calamity, MD  Findings: Cystocele, normal appearing vagina and cervix  EBL: 200 cc  Reason for procedure: Patient is a 73 y.o. G3P3 who has symptomatic cystocele. Patient counseled regarding conservative management with pessaries but she declined saying that she does not want anything staying inside of her.  She did not c/o leakage of urine until today.  She previously denies any urine leakage. She was told that surgery will not alleviate urge incontinence and may worsen it.  She still wants to proceed with repair of the cystocele.  The risks of surgery were discussed in detail with the patient including but not limited to: bleeding which may require transfusion or reoperation; infection which may require prolonged hospitalization or re-hospitalization and antibiotic therapy; injury to bowel, bladder, ureters and major vessels or other surrounding organs; need for additional procedures including laparotomy; urinary retention which may necessitate indwelling foley catheter for several days; worsening of urge incontinence; thromboembolic phenomenon, incisional problems and other postoperative or anesthesia complications. The patient also understands the alternative treatment options which were discussed in full.  Procedure:The patient was taken to the operating room where anesthesia was placed.  She was placed in the dorsal lithotomy position and prepped and draped in the usual sterile fashion.  The exam under anesthesia was performed and, as in the office, a cystocele was present.  A foley catheter was placed in the patients bladder and than the two Allis clamps were placed at the lateral edge of the vaginal mucosa. Using 6cc of 1% Lidocaine with Epinephrine, injected in to the vaginal mucosa.  Using a knife the vaginal mucosa was incised  vertically.  Usin Metzenbaum scissors and the vaginal mucosa was dissected off of the pubovesical cervical fascia.  The urethra was identified.  There was some bleeding laterally. 2-0 vicryl sutures were used throughout the case.  Next several interuppted sutures were placed in the pubovesical cervical fascia while reducing the cystocele with horizontal mattress sutures being careful to avoid the bladder.  The edges of the vaginal mucosa were trimmed and the vaginal mucosa was reapproximated using 2-0 Chromic with interrupted sutures.  Excellent hemostasis was noted.    Sponge, lap and needle counts were correct times 2 and there was no known complicated noted.  The patient tolerated the procedure well and was take to recovery room in awake and in stable condition.         Donnamae Jude, MD 12/31/2013, 11:06 AM

## 2014-01-01 ENCOUNTER — Encounter (HOSPITAL_COMMUNITY): Payer: Self-pay | Admitting: Family Medicine

## 2014-01-01 ENCOUNTER — Other Ambulatory Visit: Payer: Self-pay | Admitting: *Deleted

## 2014-01-01 ENCOUNTER — Ambulatory Visit: Payer: Commercial Managed Care - HMO

## 2014-01-01 MED ORDER — TRAMADOL HCL 50 MG PO TABS: 50.0000 mg | ORAL_TABLET | Freq: Two times a day (BID) | ORAL | Status: DC | PRN

## 2014-01-01 NOTE — Telephone Encounter (Signed)
Okay #[phone] x 0 

## 2014-01-01 NOTE — Telephone Encounter (Signed)
rx called into pharmacy

## 2014-01-01 NOTE — Telephone Encounter (Signed)
11/02/13 

## 2014-01-11 ENCOUNTER — Other Ambulatory Visit: Payer: Self-pay | Admitting: Internal Medicine

## 2014-01-11 NOTE — Telephone Encounter (Signed)
11/30/13 

## 2014-01-12 NOTE — Telephone Encounter (Signed)
rx called into pharmacy

## 2014-01-12 NOTE — Telephone Encounter (Signed)
Okay #[phone] x 0 

## 2014-01-26 ENCOUNTER — Encounter: Payer: Self-pay | Admitting: Family Medicine

## 2014-01-26 ENCOUNTER — Ambulatory Visit: Payer: Medicare HMO | Admitting: Family Medicine

## 2014-01-26 ENCOUNTER — Encounter: Payer: Commercial Managed Care - HMO | Admitting: Family Medicine

## 2014-01-26 VITALS — BP 155/86 | HR 86 | Ht 61.0 in | Wt 129.0 lb

## 2014-01-26 DIAGNOSIS — IMO0002 Reserved for concepts with insufficient information to code with codable children: Secondary | ICD-10-CM

## 2014-01-26 NOTE — Assessment & Plan Note (Signed)
S/p repair--doing well--no sex for 4 more weeks.  No heavy lifting x 4 more weeks. Released from surgery at that time.

## 2014-01-26 NOTE — Patient Instructions (Signed)
Cystocele Repair Cystocele repair is surgery to remove a cystocele, which is a bulging, drooping area (hernia) of the bladder that extends into the vagina. This bulging occurs on the top front wall of the vagina. LET [facility] CARE PROVIDER KNOW ABOUT:   Any allergies you have.  All medicines you are taking, including vitamins, herbs, eye drops, creams, and over-the-counter medicines.  Use of steroids (by mouth or creams).  Previous problems you or members of your family have had with the use of anesthetics.  Any blood disorders you have.  Previous surgeries you have had.  Medical conditions you have.  Possibility of pregnancy, if this applies. RISKS AND COMPLICATIONS  Generally, this is a safe procedure. However, as with any procedure, complications can occur. Possible complications include:  Excessive bleeding.  Infection.  Injury to surrounding structures.  Problems related to anesthetics. The risks will vary depending on the type of anesthetic given.  Problems with the urinary catheter after surgery, such as blockage.  Return of the cystocele. BEFORE THE PROCEDURE   Ask your health care provider about changing or stopping your regular medicines. You may need to stop taking certain medicines 1 week before surgery.  Do not eat or drink anything after midnight the night before surgery.  If you smoke, do not smoke for at least 2 weeks before the surgery.  Do not drink any alcohol for 3 days before the surgery.  Arrange for someone to drive you home after your hospital stay and to help you with activities during recovery. PROCEDURE   You will be given a medicine that makes you sleep through the procedure (general anesthetic) or a medicine injected into your spine that numbs your body below the waist (spinal or epidural anesthetic). You will be asleep or be numbed through the entire procedure.  A thin, flexible tube (Foley catheter) will be placed in your bladder to  drain urine during and after the surgery.  The surgery is performed through the vagina. The front wall of the vagina is opened up, and the muscle between the bladder and vagina is pulled up to its normal position. This is reinforced with stitches or a piece of mesh. This removes the hernia so that the top of the vagina does not fall into the opening of the vagina.  The cut on the front wall of the vagina is then closed with absorbable stitches that do not need to be removed. AFTER THE PROCEDURE   You will be taken to a recovery area where your progress will be closely watched. Your breathing, blood pressure, and pulse (vital signs) will be checked often. When you are stable, you will be taken to a regular hospital room.  You will have a catheter in place to drain your bladder. This will stay in place for 2-7 days or until your bladder is working properly on its own.  You may have gauze packing in the vagina. This will be removed 1-2 days after the surgery.  You will be given pain medicine as needed and may be given a medicine that kills germs (antibiotic).  You will likely need to stay in the hospital for 1-2 days. Document Released: 04/06/2000 Document Revised: 12/10/2012 Document Reviewed: 09/26/2012 [facility] Patient Information 2015 [place], [place]. This information is not intended to replace advice given to you by your health care provider. Make sure you discuss any questions you have with your health care provider.

## 2014-01-26 NOTE — Progress Notes (Signed)
    Subjective:    Patient ID: Katie Bell is a 73 y.o. female presenting with Routine Post Op  on 01/26/2014  HPI: Reports feeling no bulge or symptoms since surgery.  Back to work.  Review of Systems  Constitutional: Negative for fever and chills.  Respiratory: Negative for shortness of breath.   Cardiovascular: Negative for chest pain.  Gastrointestinal: Negative for nausea, vomiting and abdominal pain.  Genitourinary: Negative for dysuria.  Skin: Negative for rash.      Objective:    BP 155/86  Pulse 86  Ht 5\' 1"  (1.549 m)  Wt 129 lb (58.514 kg)  BMI 24.39 kg/m2 Physical Exam  Vitals reviewed. Constitutional: She appears well-developed and well-nourished.  Cardiovascular: Normal rate.   Pulmonary/Chest: Effort normal.  Abdominal: Soft.  Genitourinary:  Excellent anterior support, no drop with valsalva        Assessment & Plan:   Problem List Items Addressed This Visit     Unprioritized   Cystocele - Primary     S/p repair--doing well--no sex for 4 more weeks.  No heavy lifting x 4 more weeks. Released from surgery at that time.          Return in about 3 months (around 04/28/2014).

## 2014-02-16 ENCOUNTER — Ambulatory Visit (INDEPENDENT_AMBULATORY_CARE_PROVIDER_SITE_OTHER): Payer: Commercial Managed Care - HMO | Admitting: Internal Medicine

## 2014-02-16 ENCOUNTER — Encounter: Payer: Self-pay | Admitting: Internal Medicine

## 2014-02-16 VITALS — BP 140/80 | HR 90 | Temp 97.6°F | Ht 61.0 in | Wt 132.0 lb

## 2014-02-16 DIAGNOSIS — Z0000 Encounter for general adult medical examination without abnormal findings: Secondary | ICD-10-CM

## 2014-02-16 DIAGNOSIS — R300 Dysuria: Secondary | ICD-10-CM

## 2014-02-16 DIAGNOSIS — N959 Unspecified menopausal and perimenopausal disorder: Secondary | ICD-10-CM

## 2014-02-16 DIAGNOSIS — Z23 Encounter for immunization: Secondary | ICD-10-CM

## 2014-02-16 DIAGNOSIS — I10 Essential (primary) hypertension: Secondary | ICD-10-CM

## 2014-02-16 DIAGNOSIS — N951 Menopausal and female climacteric states: Secondary | ICD-10-CM

## 2014-02-16 DIAGNOSIS — M150 Primary generalized (osteo)arthritis: Secondary | ICD-10-CM

## 2014-02-16 DIAGNOSIS — M159 Polyosteoarthritis, unspecified: Secondary | ICD-10-CM

## 2014-02-16 DIAGNOSIS — Z7189 Other specified counseling: Secondary | ICD-10-CM

## 2014-02-16 DIAGNOSIS — M8949 Other hypertrophic osteoarthropathy, multiple sites: Secondary | ICD-10-CM

## 2014-02-16 DIAGNOSIS — F39 Unspecified mood [affective] disorder: Secondary | ICD-10-CM

## 2014-02-16 DIAGNOSIS — N3090 Cystitis, unspecified without hematuria: Secondary | ICD-10-CM

## 2014-02-16 LAB — CBC WITH DIFFERENTIAL/PLATELET
Basophils Absolute: 0.1 10*3/uL (ref 0.0–0.1)
Basophils Relative: 0.7 % (ref 0.0–3.0)
EOS PCT: 6 % — AB (ref 0.0–5.0)
Eosinophils Absolute: 0.5 10*3/uL (ref 0.0–0.7)
HCT: 44.9 % (ref 36.0–46.0)
HEMOGLOBIN: 14.6 g/dL (ref 12.0–15.0)
Lymphocytes Relative: 22.6 % (ref 12.0–46.0)
Lymphs Abs: 1.9 10*3/uL (ref 0.7–4.0)
MCHC: 32.6 g/dL (ref 30.0–36.0)
MCV: 95.4 fl (ref 78.0–100.0)
MONOS PCT: 8.3 % (ref 3.0–12.0)
Monocytes Absolute: 0.7 10*3/uL (ref 0.1–1.0)
NEUTROS ABS: 5.3 10*3/uL (ref 1.4–7.7)
Neutrophils Relative %: 62.4 % (ref 43.0–77.0)
Platelets: 262 10*3/uL (ref 150.0–400.0)
RBC: 4.71 Mil/uL (ref 3.87–5.11)
RDW: 11.8 % (ref 11.5–15.5)
WBC: 8.5 10*3/uL (ref 4.0–10.5)

## 2014-02-16 LAB — POCT URINALYSIS DIPSTICK
Bilirubin, UA: NEGATIVE
Glucose, UA: NEGATIVE
Ketones, UA: NEGATIVE
NITRITE UA: POSITIVE
PH UA: 6
Protein, UA: NEGATIVE
RBC UA: NEGATIVE
SPEC GRAV UA: 1.02
UROBILINOGEN UA: NEGATIVE

## 2014-02-16 LAB — T4, FREE: Free T4: 0.88 ng/dL (ref 0.60–1.60)

## 2014-02-16 LAB — COMPREHENSIVE METABOLIC PANEL
ALT: 12 U/L (ref 0–35)
AST: 17 U/L (ref 0–37)
Albumin: 3.9 g/dL (ref 3.5–5.2)
Alkaline Phosphatase: 46 U/L (ref 39–117)
BILIRUBIN TOTAL: 0.7 mg/dL (ref 0.2–1.2)
BUN: 16 mg/dL (ref 6–23)
CO2: 25 meq/L (ref 19–32)
CREATININE: 1.2 mg/dL (ref 0.4–1.2)
Calcium: 10.2 mg/dL (ref 8.4–10.5)
Chloride: 102 mEq/L (ref 96–112)
GFR: 47.07 mL/min — AB (ref >60.00)
Glucose, Bld: 100 mg/dL — ABNORMAL HIGH (ref 70–99)
Potassium: 3.6 mEq/L (ref 3.5–5.1)
Sodium: 134 mEq/L — ABNORMAL LOW (ref 135–145)
Total Protein: 7.9 g/dL (ref 6.0–8.3)

## 2014-02-16 LAB — LIPID PANEL
CHOL/HDL RATIO: 5
Cholesterol: 221 mg/dL — ABNORMAL HIGH (ref 0–200)
HDL: 49.1 mg/dL (ref >39.00)
LDL Cholesterol: 135 mg/dL — ABNORMAL HIGH (ref 0–99)
NONHDL: 171.9
TRIGLYCERIDES: 186 mg/dL — AB (ref 0.0–149.0)
VLDL: 37.2 mg/dL (ref 0.0–40.0)

## 2014-02-16 MED ORDER — TRAMADOL HCL 50 MG PO TABS: 50.0000 mg | ORAL_TABLET | Freq: Two times a day (BID) | ORAL | Status: DC | PRN

## 2014-02-16 MED ORDER — DIAZEPAM 10 MG PO TABS
5.0000 mg | ORAL_TABLET | Freq: Two times a day (BID) | ORAL | Status: DC | PRN
Start: 2014-02-16 — End: 2014-03-29

## 2014-02-16 NOTE — Assessment & Plan Note (Signed)
Fish oil really helps Uses the tramadol sparingly

## 2014-02-16 NOTE — Progress Notes (Signed)
Subjective:    Patient ID: Katie Bell, female    DOB: [birth year], 73 y.o.   MRN: [id number]  HPI Here for Medicare wellness visit and follow up Reviewed her form and advanced directives No falls Had cystocele repair recently--has done well with this Reviewed other doctors No alcohol or tobacco Tries to exercise regularly Vision and hearing are fine No cognitive problems--minor memory issues (better since on vitamin E)  Concerned about her urine again Gets intermittent burning Wants to be sure there is no infection Acknowledges that her nerves may be affecting it Gets nocturia frequently No problems at work--when she is busy  Still with anxiety at times Uses the diazepam at night for sleep-- takes 1/4 to 1/2 Some depressed mood but not anhedonic  No problems with BP med No throat problems. Rare cough if she has a cold No chest pain No palpitations No dizziness or syncope  Has been cutting the estrogen in half No problems at the lower dose No hot flashes in years  Uses the tramadol once in a while Fish oil has helped her joint pain well Tolerates this when frozen Fairly widespread joint pains  Current Outpatient Prescriptions on File Prior to Visit  Medication Sig Dispense Refill  . calcium carbonate (OS-CAL) 600 MG TABS tablet Take 600 mg by mouth daily.      . Cholecalciferol (VITAMIN D) 400 UNITS capsule Take 800 Units by mouth daily.      Marland Kitchen co-enzyme Q-10 30 MG capsule Take 30 mg by mouth daily.      . diazepam (VALIUM) 10 MG tablet TAKE 1/2 TABLET BY MOUTH TWICE A DAY AS NEEDED  30 tablet  0  . estradiol (ESTRACE) 1 MG tablet Take 0.5 mg by mouth daily.      Marland Kitchen losartan-hydrochlorothiazide (HYZAAR) 50-12.5 MG per tablet TAKE 1 TABLET BY MOUTH ONCE A DAY  90 tablet  3  . medroxyPROGESTERone (PROVERA) 2.5 MG tablet Take 1 tablet (2.5 mg total) by mouth daily.  90 tablet  3  . Omega-3 Fatty Acids (FISH OIL) 1000 MG CAPS Take 1 capsule by mouth daily.      Marland Kitchen  oxycodone-acetaminophen (PERCOCET) 2.5-325 MG per tablet Take 1 tablet by mouth every 4 (four) hours as needed for pain.  15 tablet  0  . traMADol (ULTRAM) 50 MG tablet Take 1 tablet (50 mg total) by mouth 2 (two) times daily as needed for moderate pain.  30 tablet  0  . vitamin C (ASCORBIC ACID) 500 MG tablet Take 500 mg by mouth daily.       No current facility-administered medications on file prior to visit.    Allergies  Allergen Reactions  . Morphine And Related Anxiety    Past Medical History  Diagnosis Date  . Hypertension   . Gastric ulcer     History  . Episodic mood disorder     mostly anxiety--depression in the past  . Postmenopausal disorder   . Anxiety   . Depression   . SVD (spontaneous vaginal delivery)     x 3  . History of kidney stones 1960  . Osteoarthritis, hand     hand  . History of blood transfusion     At [facility] appro 15 yrs ago    Past Surgical History  Procedure Laterality Date  . Tonsillectomy  1944  . Cholecystectomy  2008  . Colonoscopy  04/2011  . Wisdom tooth extraction    . Upper gi endoscopy    .  Kidney stone removed    . Tubal ligation    . Diagnostic laparoscopy    . Cystocele repair N/A 12/31/2013    Procedure: ANTERIOR REPAIR (CYSTOCELE);  Surgeon: Donnamae Jude, MD;  Location: [facility];  Service: Gynecology;  Laterality: N/A;    Family History  Problem Relation Age of Onset  . Alzheimer's disease Mother   . Alcohol abuse Father   . COPD Brother   . Heart disease Neg Hx   . Cancer Neg Hx     History   Social History  . Marital Status: Divorced    Spouse Name: N/A    Number of Children: 3  . Years of Education: N/A   Occupational History  . LPN--retired     personal care on the side   Social History Main Topics  . Smoking status: Never Smoker   . Smokeless tobacco: Never Used  . Alcohol Use: No  . Drug Use: No  . Sexual Activity: Yes    Birth Control/ Protection: Post-menopausal   Other  Topics Concern  . Not on file   Social History Narrative   3 sons--- middle one lives with her   Goes by Katie Bell      No living will   Son Katie Bell should make decisions for her   Would accept resuscitation attempts but no prolonged ventilation   No tube feeds if cognitively unaware          Review of Systems Occasional mild headaches Bowels are fine---colonoscopy was last year No rashes or suspicious skin lesions. Saw dermatologist 2 months ago or so. Teeth are fine--regular with dentist Wears seat belt    Objective:   Physical Exam  Constitutional: She is oriented to person, place, and time. She appears well-developed and well-nourished. No distress.  HENT:  Mouth/Throat: Oropharynx is clear and moist. No oropharyngeal exudate.  Eyes: Conjunctivae and EOM are normal. Pupils are equal, round, and reactive to light.  Neck: Normal range of motion. Neck supple. No thyromegaly present.  Cardiovascular: Normal rate, regular rhythm, normal heart sounds and intact distal pulses.  Exam reveals no gallop.   No murmur heard. Pulmonary/Chest: Effort normal and breath sounds normal. No respiratory distress. She has no wheezes. She has no rales.  Abdominal: Soft. There is no tenderness.  Musculoskeletal: She exhibits no edema and no tenderness.  Lymphadenopathy:    She has no cervical adenopathy.  Neurological: She is alert and oriented to person, place, and time.  President-- "Obama, Anne Hahn....   Then Eulas Post, no, the guy who had the thing with the women, his wife is running for president" [phone]? [id number] Recall 3/3  Skin: No rash noted. No erythema.  Psychiatric: She has a normal mood and affect. Her behavior is normal.          Assessment & Plan:

## 2014-02-16 NOTE — Addendum Note (Signed)
Addended by: Despina Hidden on: 02/16/2014 10:50 AM   Modules accepted: Orders

## 2014-02-16 NOTE — Addendum Note (Signed)
Addended by: Despina Hidden on: 02/16/2014 03:18 PM   Modules accepted: Orders

## 2014-02-16 NOTE — Assessment & Plan Note (Signed)
Does okay in general Mild dysthymia  Anxiety mostly affects sleep---uses diazepam at times

## 2014-02-16 NOTE — Assessment & Plan Note (Signed)
No recent issue with sweats Will ask her to try to wean down to lowest effective dose---hopefully she can stop (she has some misconception that the estrogen is an anti-aging Rx though)

## 2014-02-16 NOTE — Assessment & Plan Note (Signed)
BP Readings from Last 3 Encounters:  02/16/14 140/80  01/26/14 155/86  12/31/13 109/43   Good control No changes needed Will check labs

## 2014-02-16 NOTE — Patient Instructions (Signed)
Please set up your screening mammogram. Please try to decrease the estradiol-- take it only 6 days per week. After a month, if no symptoms, you can go down to 5 days per week. Continue to reduce the dose to the lowest dose that keeps the hot flashes away (it is possible you won't need any anymore). Please continue the provera every day.

## 2014-02-16 NOTE — Assessment & Plan Note (Signed)
See social history 

## 2014-02-16 NOTE — Progress Notes (Signed)
Pre visit review using our clinic review tool, if applicable. No additional management support is needed unless otherwise documented below in the visit note. 

## 2014-02-16 NOTE — Assessment & Plan Note (Signed)
Has intermittent symptoms and positive urine Symptoms are gone now though Likely asymptomatic bacteriuria She can take left over antibiotic if symptoms are persistent

## 2014-02-16 NOTE — Assessment & Plan Note (Signed)
I have personally reviewed the Medicare Annual Wellness questionnaire and have noted 1. The patient's medical and social history 2. Their use of alcohol, tobacco or illicit drugs 3. Their current medications and supplements 4. The patient's functional ability including ADL's, fall risks, home safety risks and hearing or visual             impairment. 5. Diet and physical activities 6. Evidence for depression or mood disorders  The patients weight, height, BMI and visual acuity have been recorded in the chart I have made referrals, counseling and provided education to the patient based review of the above and I have provided the pt with a written personalized care plan for preventive services.  I have provided you with a copy of your personalized plan for preventive services. Please take the time to review along with your updated medication list.  Due for mammogram---she asked that I not do a manual breast exam Colonoscopy done 2014 No Pap due to age Td, prevnar and flu shots today

## 2014-02-17 ENCOUNTER — Encounter: Payer: Self-pay | Admitting: *Deleted

## 2014-02-22 ENCOUNTER — Encounter: Payer: Self-pay | Admitting: Internal Medicine

## 2014-03-29 ENCOUNTER — Other Ambulatory Visit: Payer: Self-pay | Admitting: Internal Medicine

## 2014-03-29 NOTE — Telephone Encounter (Signed)
Approved: 30 x 0 

## 2014-03-29 NOTE — Telephone Encounter (Signed)
02/16/14 

## 2014-03-29 NOTE — Telephone Encounter (Signed)
rx called into pharmacy

## 2014-05-03 ENCOUNTER — Other Ambulatory Visit: Payer: Self-pay | Admitting: Internal Medicine

## 2014-05-03 NOTE — Telephone Encounter (Signed)
03/29/14 

## 2014-05-03 NOTE — Telephone Encounter (Signed)
Approved: okay #30 x 0 for each 

## 2014-05-04 NOTE — Telephone Encounter (Signed)
rx called into pharmacy

## 2014-05-25 ENCOUNTER — Other Ambulatory Visit: Payer: Self-pay | Admitting: Internal Medicine

## 2014-05-31 ENCOUNTER — Encounter: Payer: Self-pay | Admitting: Internal Medicine

## 2014-05-31 ENCOUNTER — Ambulatory Visit (INDEPENDENT_AMBULATORY_CARE_PROVIDER_SITE_OTHER): Payer: Commercial Managed Care - HMO | Admitting: Internal Medicine

## 2014-05-31 VITALS — BP 136/78 | HR 84 | Temp 98.6°F | Wt 135.0 lb

## 2014-05-31 DIAGNOSIS — L989 Disorder of the skin and subcutaneous tissue, unspecified: Secondary | ICD-10-CM | POA: Diagnosis not present

## 2014-05-31 NOTE — Progress Notes (Signed)
Subjective:    Patient ID: Katie Bell, female    DOB: 1940-11-10, 74 y.o.   MRN: [id number]  HPI Here due to lesion on left thigh Noticed it around 2 days ago Had sense that something bit her Inflamed and doesn't look good--- she had scratched it some  No fever No trauma  Current Outpatient Prescriptions on File Prior to Visit  Medication Sig Dispense Refill  . calcium carbonate (OS-CAL) 600 MG TABS tablet Take 600 mg by mouth daily.    . Cholecalciferol (VITAMIN D) 400 UNITS capsule Take 800 Units by mouth daily.    Marland Kitchen co-enzyme Q-10 30 MG capsule Take 30 mg by mouth daily.    . diazepam (VALIUM) 10 MG tablet TAKE 1/2 A TABLET BY MOUTH TWICE DAILY AS NEEDED FOR ANXIETY OR SLEEP 30 tablet 0  . losartan-hydrochlorothiazide (HYZAAR) 50-12.5 MG per tablet TAKE 1 TABLET BY MOUTH ONCE A DAY 90 tablet 3  . Omega-3 Fatty Acids (FISH OIL) 1000 MG CAPS Take 1 capsule by mouth daily.    . traMADol (ULTRAM) 50 MG tablet TAKE ONE TABLET BY MOUTH TWICE DAILY AS NEEDED FOR PAIN 30 tablet 0  . vitamin C (ASCORBIC ACID) 500 MG tablet Take 500 mg by mouth daily.    Marland Kitchen estradiol (ESTRACE) 1 MG tablet Take 0.5 mg by mouth daily.    . medroxyPROGESTERone (PROVERA) 2.5 MG tablet Take 1 tablet (2.5 mg total) by mouth daily. (Patient not taking: Reported on 05/31/2014) 90 tablet 3  . oxycodone-acetaminophen (PERCOCET) 2.5-325 MG per tablet Take 1 tablet by mouth every 4 (four) hours as needed for pain. (Patient not taking: Reported on 05/31/2014) 15 tablet 0   No current facility-administered medications on file prior to visit.    Allergies  Allergen Reactions  . Morphine And Related Anxiety    Past Medical History  Diagnosis Date  . Hypertension   . Gastric ulcer     History  . Episodic mood disorder     mostly anxiety--depression in the past  . Postmenopausal disorder   . Anxiety   . Depression   . SVD (spontaneous vaginal delivery)     x 3  . History of kidney stones 1960  . Osteoarthritis,  hand     hand  . History of blood transfusion     At [facility] appro 15 yrs ago    Past Surgical History  Procedure Laterality Date  . Tonsillectomy  1944  . Cholecystectomy  2008  . Colonoscopy  04/2011  . Wisdom tooth extraction    . Upper gi endoscopy    . Kidney stone removed    . Tubal ligation    . Diagnostic laparoscopy    . Cystocele repair N/A 12/31/2013    Procedure: ANTERIOR REPAIR (CYSTOCELE);  Surgeon: Donnamae Jude, MD;  Location: [facility];  Service: Gynecology;  Laterality: N/A;    Family History  Problem Relation Age of Onset  . Alzheimer's disease Mother   . Alcohol abuse Father   . COPD Brother   . Heart disease Neg Hx   . Cancer Neg Hx     History   Social History  . Marital Status: Divorced    Spouse Name: N/A    Number of Children: 3  . Years of Education: N/A   Occupational History  . LPN--retired     personal care on the side   Social History Main Topics  . Smoking status: Never Smoker   .  Smokeless tobacco: Never Used  . Alcohol Use: No  . Drug Use: No  . Sexual Activity: Yes    Birth Control/ Protection: Post-menopausal   Other Topics Concern  . Not on file   Social History Narrative   3 sons--- middle one lives with her   [name]      No living will   Son [name]   Would accept resuscitation attempts but no prolonged ventilation   No tube feeds if cognitively unaware         Review of Systems Not sick No GI symptoms    Objective:   Physical Exam  Constitutional: She appears well-developed and well-nourished. No distress.  Skin:  ~8-28mm dark lesion on right posterior thigh Crusting that is clearly blood along the lateral border and dark macular area along the medial side          Assessment & Plan:

## 2014-05-31 NOTE — Progress Notes (Signed)
Pre visit review using our clinic review tool, if applicable. No additional management support is needed unless otherwise documented below in the visit note. 

## 2014-05-31 NOTE — Patient Instructions (Signed)
If the darkness of this spot doesn't clear in the next 2-3 weeks, you will need to see a dermatologist.

## 2014-05-31 NOTE — Assessment & Plan Note (Signed)
Some of this is clearly blood from scratching. If this was cherry angioma that she scratched, or some other lesion, the dark area under the skin might be blood If the macular dark area persists, she will need to see a dermatologist

## 2014-06-02 ENCOUNTER — Other Ambulatory Visit: Payer: Self-pay | Admitting: Internal Medicine

## 2014-06-02 NOTE — Telephone Encounter (Signed)
Last filled 05/04/14---BID PRN #[id number]--please advise

## 2014-06-03 NOTE — Telephone Encounter (Signed)
Approved: 30 x 0 

## 2014-06-03 NOTE — Telephone Encounter (Signed)
Rx called in to pharmacy. 

## 2014-06-08 ENCOUNTER — Other Ambulatory Visit: Payer: Self-pay | Admitting: Internal Medicine

## 2014-06-08 NOTE — Telephone Encounter (Signed)
Rx called to pharmacy as instructed. 

## 2014-06-08 NOTE — Telephone Encounter (Signed)
Received refill request electronically. Last refill 05/04/14 #30, last office visit 2/816. Is it okay to refill medication?

## 2014-06-08 NOTE — Telephone Encounter (Signed)
Approved: 30 x 0 

## 2014-07-07 ENCOUNTER — Other Ambulatory Visit: Payer: Self-pay | Admitting: Internal Medicine

## 2014-07-08 NOTE — Telephone Encounter (Signed)
Diazepam 06/08/14 Tramadol 06/03/14

## 2014-07-08 NOTE — Telephone Encounter (Signed)
rx called into pharmacy

## 2014-07-08 NOTE — Telephone Encounter (Signed)
Approved: okay #30 x 0 for each 

## 2014-07-14 DIAGNOSIS — C439 Malignant melanoma of skin, unspecified: Secondary | ICD-10-CM

## 2014-07-14 DIAGNOSIS — C4372 Malignant melanoma of left lower limb, including hip: Secondary | ICD-10-CM | POA: Diagnosis not present

## 2014-07-14 DIAGNOSIS — R208 Other disturbances of skin sensation: Secondary | ICD-10-CM | POA: Diagnosis not present

## 2014-07-14 DIAGNOSIS — L981 Factitial dermatitis: Secondary | ICD-10-CM | POA: Diagnosis not present

## 2014-07-14 DIAGNOSIS — D485 Neoplasm of uncertain behavior of skin: Secondary | ICD-10-CM | POA: Diagnosis not present

## 2014-07-14 HISTORY — DX: Malignant melanoma of skin, unspecified: C43.9

## 2014-08-03 DIAGNOSIS — L905 Scar conditions and fibrosis of skin: Secondary | ICD-10-CM | POA: Diagnosis not present

## 2014-08-03 DIAGNOSIS — D0372 Melanoma in situ of left lower limb, including hip: Secondary | ICD-10-CM | POA: Diagnosis not present

## 2014-08-12 ENCOUNTER — Other Ambulatory Visit: Payer: Self-pay | Admitting: Internal Medicine

## 2014-08-12 NOTE — Telephone Encounter (Signed)
07/08/14 

## 2014-08-12 NOTE — Telephone Encounter (Signed)
Approved: 30 x 0 

## 2014-08-12 NOTE — Telephone Encounter (Signed)
rx called into pharmacy

## 2014-08-16 ENCOUNTER — Other Ambulatory Visit: Payer: Self-pay | Admitting: Internal Medicine

## 2014-08-16 NOTE — Telephone Encounter (Signed)
rx called into pharmacy

## 2014-08-16 NOTE — Telephone Encounter (Signed)
Approved: 30 x 0 

## 2014-08-16 NOTE — Telephone Encounter (Signed)
07/08/14 

## 2014-09-02 ENCOUNTER — Other Ambulatory Visit: Payer: Self-pay | Admitting: Internal Medicine

## 2014-09-02 NOTE — Telephone Encounter (Signed)
rx called into pharmacy

## 2014-09-02 NOTE — Telephone Encounter (Signed)
08/12/2014, too early?

## 2014-09-02 NOTE — Telephone Encounter (Signed)
Approved: No, is written bid  So okay #[id number] x 0

## 2014-09-21 ENCOUNTER — Other Ambulatory Visit: Payer: Self-pay | Admitting: Internal Medicine

## 2014-09-22 NOTE — Telephone Encounter (Signed)
rx called into pharmacy

## 2014-09-22 NOTE — Telephone Encounter (Signed)
08/16/2014 

## 2014-09-22 NOTE — Telephone Encounter (Signed)
Approved: 30 x 0 

## 2014-10-06 ENCOUNTER — Other Ambulatory Visit: Payer: Self-pay | Admitting: Internal Medicine

## 2014-10-06 NOTE — Telephone Encounter (Signed)
09/02/14 

## 2014-10-06 NOTE — Telephone Encounter (Signed)
Approved: okay #[id number] x 0 

## 2014-10-06 NOTE — Telephone Encounter (Signed)
rx called into pharmacy

## 2014-10-21 ENCOUNTER — Other Ambulatory Visit: Payer: Self-pay | Admitting: Internal Medicine

## 2014-10-21 NOTE — Telephone Encounter (Signed)
Approved: okay #[id number] x 0 

## 2014-10-21 NOTE — Telephone Encounter (Signed)
rx called into pharmacy

## 2014-10-21 NOTE — Telephone Encounter (Signed)
09/22/14 

## 2014-10-27 ENCOUNTER — Other Ambulatory Visit: Payer: Self-pay | Admitting: Internal Medicine

## 2014-10-30 ENCOUNTER — Encounter: Payer: Self-pay | Admitting: *Deleted

## 2014-10-30 DIAGNOSIS — Z79899 Other long term (current) drug therapy: Secondary | ICD-10-CM | POA: Insufficient documentation

## 2014-10-30 DIAGNOSIS — Z793 Long term (current) use of hormonal contraceptives: Secondary | ICD-10-CM | POA: Diagnosis not present

## 2014-10-30 DIAGNOSIS — R509 Fever, unspecified: Secondary | ICD-10-CM | POA: Diagnosis not present

## 2014-10-30 DIAGNOSIS — I10 Essential (primary) hypertension: Secondary | ICD-10-CM | POA: Insufficient documentation

## 2014-10-30 DIAGNOSIS — R51 Headache: Secondary | ICD-10-CM | POA: Diagnosis not present

## 2014-10-30 DIAGNOSIS — N390 Urinary tract infection, site not specified: Secondary | ICD-10-CM | POA: Insufficient documentation

## 2014-10-30 NOTE — ED Notes (Addendum)
EMS report: Pt c/o fever of 104. EMS unable to confirm. Pt ambulatory for EMS. Unable to state if pt lives alone. VS for EMS: 120/ palp, 118, 18. Pt reports sudden onset of fever of 100.4, generalized body aches, and chills.

## 2014-10-31 ENCOUNTER — Emergency Department: Payer: Commercial Managed Care - HMO

## 2014-10-31 ENCOUNTER — Emergency Department
Admission: EM | Admit: 2014-10-31 | Discharge: 2014-10-31 | Disposition: A | Discharge status: Home / Self Care | Payer: Commercial Managed Care - HMO | Attending: Emergency Medicine | Admitting: Emergency Medicine

## 2014-10-31 DIAGNOSIS — Z79899 Other long term (current) drug therapy: Secondary | ICD-10-CM | POA: Diagnosis not present

## 2014-10-31 DIAGNOSIS — I10 Essential (primary) hypertension: Secondary | ICD-10-CM | POA: Diagnosis not present

## 2014-10-31 DIAGNOSIS — R51 Headache: Secondary | ICD-10-CM | POA: Diagnosis not present

## 2014-10-31 DIAGNOSIS — N390 Urinary tract infection, site not specified: Secondary | ICD-10-CM

## 2014-10-31 DIAGNOSIS — Z793 Long term (current) use of hormonal contraceptives: Secondary | ICD-10-CM | POA: Diagnosis not present

## 2014-10-31 DIAGNOSIS — R509 Fever, unspecified: Secondary | ICD-10-CM | POA: Diagnosis not present

## 2014-10-31 LAB — URINALYSIS COMPLETE WITH MICROSCOPIC (ARMC ONLY)
BILIRUBIN URINE: NEGATIVE
Glucose, UA: NEGATIVE mg/dL
KETONES UR: NEGATIVE mg/dL
Nitrite: POSITIVE — AB
Protein, ur: 30 mg/dL — AB
SPECIFIC GRAVITY, URINE: 1.013 (ref 1.005–1.030)
pH: 6 (ref 5.0–8.0)

## 2014-10-31 LAB — COMPREHENSIVE METABOLIC PANEL
ALT: 16 U/L (ref 14–54)
AST: 22 U/L (ref 15–41)
Albumin: 4.4 g/dL (ref 3.5–5.0)
Alkaline Phosphatase: 49 U/L (ref 38–126)
Anion gap: 9 (ref 5–15)
BUN: 13 mg/dL (ref 6–20)
CALCIUM: 10.2 mg/dL (ref 8.9–10.3)
CHLORIDE: 100 mmol/L — AB (ref 101–111)
CO2: 24 mmol/L (ref 22–32)
Creatinine, Ser: 1.14 mg/dL — ABNORMAL HIGH (ref 0.44–1.00)
GFR, EST AFRICAN AMERICAN: 53 mL/min — AB (ref >60)
GFR, EST NON AFRICAN AMERICAN: 46 mL/min — AB (ref >60)
Glucose, Bld: 129 mg/dL — ABNORMAL HIGH (ref 65–99)
POTASSIUM: 3.7 mmol/L (ref 3.5–5.1)
Sodium: 133 mmol/L — ABNORMAL LOW (ref 135–145)
Total Bilirubin: 0.7 mg/dL (ref 0.3–1.2)
Total Protein: 7.9 g/dL (ref 6.5–8.1)

## 2014-10-31 LAB — CBC
HEMATOCRIT: 44.5 % (ref 35.0–47.0)
HEMOGLOBIN: 15 g/dL (ref 12.0–16.0)
MCH: 31.6 pg (ref 26.0–34.0)
MCHC: 33.8 g/dL (ref 32.0–36.0)
MCV: 93.5 fL (ref 80.0–100.0)
Platelets: 199 10*3/uL (ref 150–440)
RBC: 4.76 MIL/uL (ref 3.80–5.20)
RDW: 12.2 % (ref 11.5–14.5)
WBC: 17.3 10*3/uL — AB (ref 3.6–11.0)

## 2014-10-31 LAB — TROPONIN I

## 2014-10-31 MED ORDER — CEFTRIAXONE SODIUM 1 G IJ SOLR
1.0000 g | Freq: Once | INTRAMUSCULAR | Status: AC
  Administered 2014-10-31: 1 g via INTRAMUSCULAR

## 2014-10-31 MED ORDER — ACETAMINOPHEN 500 MG PO TABS
ORAL_TABLET | ORAL | Status: AC
  Administered 2014-10-31: 1000 mg via ORAL
  Filled 2014-10-31: qty 2

## 2014-10-31 MED ORDER — CEFTRIAXONE SODIUM 1 G IJ SOLR
INTRAMUSCULAR | Status: AC
  Administered 2014-10-31: 1 g via INTRAMUSCULAR
  Filled 2014-10-31: qty 10

## 2014-10-31 MED ORDER — SULFAMETHOXAZOLE-TRIMETHOPRIM 800-160 MG PO TABS: 1.0000 | ORAL_TABLET | Freq: Two times a day (BID) | ORAL | Status: DC

## 2014-10-31 MED ORDER — ACETAMINOPHEN 500 MG PO TABS
1000.0000 mg | ORAL_TABLET | Freq: Once | ORAL | Status: AC
  Administered 2014-10-31: 1000 mg via ORAL

## 2014-10-31 MED ORDER — CEFTRIAXONE SODIUM IN DEXTROSE 20 MG/ML IV SOLN: 1.0000 g | Freq: Once | INTRAVENOUS | Status: DC

## 2014-10-31 NOTE — ED Notes (Signed)
RN called lab to determine results of UA, stated they needed additional sample.

## 2014-10-31 NOTE — ED Notes (Signed)
Pt sleeping soundly, resp even and unlabored.

## 2014-10-31 NOTE — ED Notes (Signed)
Pt alert and in NAD at time of d/c 

## 2014-10-31 NOTE — Discharge Instructions (Signed)
1. Start antibiotics as prescribed (Septra DS twice daily 7 days). 2. Alternate Tylenol and Motrin every 4 hours as needed for fever greater than 100.79F. 3. Drink plenty of fluids daily. 4. Return to the ER for worsening symptoms, persistent vomiting, difficulty breathing or other concerns.  Urinary Tract Infection Urinary tract infections (UTIs) can develop anywhere along your urinary tract. Your urinary tract is your body's drainage system for removing wastes and extra water. Your urinary tract includes two kidneys, two ureters, a bladder, and a urethra. Your kidneys are a pair of bean-shaped organs. Each kidney is about the size of your fist. They are located below your ribs, one on each side of your spine. CAUSES Infections are caused by microbes, which are microscopic organisms, including fungi, viruses, and bacteria. These organisms are so small that they can only be seen through a microscope. Bacteria are the microbes that most commonly cause UTIs. SYMPTOMS  Symptoms of UTIs may vary by age and gender of the patient and by the location of the infection. Symptoms in young women typically include a frequent and intense urge to urinate and a painful, burning feeling in the bladder or urethra during urination. Older women and men are more likely to be tired, shaky, and weak and have muscle aches and abdominal pain. A fever may mean the infection is in your kidneys. Other symptoms of a kidney infection include pain in your back or sides below the ribs, nausea, and vomiting. DIAGNOSIS To diagnose a UTI, your caregiver will ask you about your symptoms. Your caregiver also will ask to provide a urine sample. The urine sample will be tested for bacteria and white blood cells. White blood cells are made by your body to help fight infection. TREATMENT  Typically, UTIs can be treated with medication. Because most UTIs are caused by a bacterial infection, they usually can be treated with the use of  antibiotics. The choice of antibiotic and length of treatment depend on your symptoms and the type of bacteria causing your infection. HOME CARE INSTRUCTIONS  If you were prescribed antibiotics, take them exactly as your caregiver instructs you. Finish the medication even if you feel better after you have only taken some of the medication.  Drink enough water and fluids to keep your urine clear or pale yellow.  Avoid caffeine, tea, and carbonated beverages. They tend to irritate your bladder.  Empty your bladder often. Avoid holding urine for long periods of time.  Empty your bladder before and after sexual intercourse.  After a bowel movement, women should cleanse from front to back. Use each tissue only once. SEEK MEDICAL CARE IF:   You have back pain.  You develop a fever.  Your symptoms do not begin to resolve within 3 days. SEEK IMMEDIATE MEDICAL CARE IF:   You have severe back pain or lower abdominal pain.  You develop chills.  You have nausea or vomiting.  You have continued burning or discomfort with urination. MAKE SURE YOU:   Understand these instructions.  Will watch your condition.  Will get help right away if you are not doing well or get worse. Document Released: 01/17/2005 Document Revised: 10/09/2011 Document Reviewed: 05/18/2011 [facility] Patient Information 2015 [place], [place]. This information is not intended to replace advice given to you by your health care provider. Make sure you discuss any questions you have with your health care provider.  Fever, Adult A fever is a higher than normal body temperature. In an adult, an oral temperature around  98.6 F (37 C) is considered normal. A temperature of 100.4 F (38 C) or higher is generally considered a fever. Mild or moderate fevers generally have no long-term effects and often do not require treatment. Extreme fever (greater than or equal to 106 F or 41.1 C) can cause seizures. The sweating that may  occur with repeated or prolonged fever may cause dehydration. Elderly people can develop confusion during a fever. A measured temperature can vary with:  Age.  Time of day.  Method of measurement (mouth, underarm, rectal, or ear). The fever is confirmed by taking a temperature with a thermometer. Temperatures can be taken different ways. Some methods are accurate and some are not.  An oral temperature is used most commonly. Electronic thermometers are fast and accurate.  An ear temperature will only be accurate if the thermometer is positioned as recommended by the manufacturer.  A rectal temperature is accurate and done for those adults who have a condition where an oral temperature cannot be taken.  An underarm (axillary) temperature is not accurate and not recommended. Fever is a symptom, not a disease.  CAUSES   Infections commonly cause fever.  Some noninfectious causes for fever include:  Some arthritis conditions.  Some thyroid or adrenal gland conditions.  Some immune system conditions.  Some types of cancer.  A medicine reaction.  High doses of certain street drugs such as methamphetamine.  Dehydration.  Exposure to high outside or room temperatures.  Occasionally, the source of a fever cannot be determined. This is sometimes called a "fever of unknown origin" (FUO).  Some situations may lead to a temporary rise in body temperature that may go away on its own. Examples are:  Childbirth.  Surgery.  Intense exercise. HOME CARE INSTRUCTIONS   Take appropriate medicines for fever. Follow dosing instructions carefully. If you use acetaminophen to reduce the fever, be careful to avoid taking other medicines that also contain acetaminophen. Do not take aspirin for a fever if you are younger than age 15. There is an association with Reye's syndrome. Reye's syndrome is a rare but potentially deadly disease.  If an infection is present and antibiotics have been  prescribed, take them as directed. Finish them even if you start to feel better.  Rest as needed.  Maintain an adequate fluid intake. To prevent dehydration during an illness with prolonged or recurrent fever, you may need to drink extra fluid.Drink enough fluids to keep your urine clear or pale yellow.  Sponging or bathing with room temperature water may help reduce body temperature. Do not use ice water or alcohol sponge baths.  Dress comfortably, but do not over-bundle. SEEK MEDICAL CARE IF:   You are unable to keep fluids down.  You develop vomiting or diarrhea.  You are not feeling at least partly better after 3 days.  You develop new symptoms or problems. SEEK IMMEDIATE MEDICAL CARE IF:   You have shortness of breath or trouble breathing.  You develop excessive weakness.  You are dizzy or you faint.  You are extremely thirsty or you are making little or no urine.  You develop new pain that was not there before (such as in the head, neck, chest, back, or abdomen).  You have persistent vomiting and diarrhea for more than 1 to 2 days.  You develop a stiff neck or your eyes become sensitive to light.  You develop a skin rash.  You have a fever or persistent symptoms for more than 2 to 3 days.  You have a fever and your symptoms suddenly get worse. MAKE SURE YOU:   Understand these instructions.  Will watch your condition.  Will get help right away if you are not doing well or get worse. Document Released: 10/03/2000 Document Revised: 08/24/2013 Document Reviewed: 02/08/2011 [facility] Patient Information 2015 [place], [place]. This information is not intended to replace advice given to you by your health care provider. Make sure you discuss any questions you have with your health care provider.

## 2014-10-31 NOTE — ED Provider Notes (Signed)
[facility] Emergency Department Provider Note  ____________________________________________  Time seen: Approximately 2:18 AM  I have reviewed the triage vital signs and the nursing notes.   HISTORY  Chief Complaint Fever and Generalized Body Aches    HPI Katie Bell is a 74 y.o. female who presents to the ED from home via EMS with a chief complaint of fever, chills and myalgia. Patient states she has had body aches "all day", maximum temperature 100.62F and nonproductive cough. Patient denies chest pain, shortness of breath, abdominal pain, nausea, vomiting, diarrhea, weakness, numbness, tingling. Currently complains of a gradual-onset mild global headache.Denies vision changes or neck pain. Does complain of increased urinary frequency.   Past Medical History  Diagnosis Date  . Hypertension   . Gastric ulcer     History  . Episodic mood disorder     mostly anxiety--depression in the past  . Postmenopausal disorder   . Anxiety   . Depression   . SVD (spontaneous vaginal delivery)     x 3  . History of kidney stones 1960  . Osteoarthritis, hand     hand  . History of blood transfusion     At [facility] appro 15 yrs ago    Patient Active Problem List   Diagnosis Date Noted  . Skin lesion of right leg 05/31/2014  . Visit for preventive health examination 02/16/2014  . Advanced directives, counseling/discussion 02/16/2014  . Cystitis 11/02/2013  . Cystocele 11/02/2013  . Hypertension   . Gastric ulcer   . Episodic mood disorder   . Osteoarthritis, multiple sites   . Postmenopausal disorder     Past Surgical History  Procedure Laterality Date  . Tonsillectomy  1944  . Cholecystectomy  2008  . Colonoscopy  04/2011  . Wisdom tooth extraction    . Upper gi endoscopy    . Kidney stone removed    . Tubal ligation    . Diagnostic laparoscopy    . Cystocele repair N/A 12/31/2013    Procedure: ANTERIOR REPAIR (CYSTOCELE);   Surgeon: Donnamae Jude, MD;  Location: [facility];  Service: Gynecology;  Laterality: N/A;    Current Outpatient Rx  Name  Route  Sig  Dispense  Refill  . calcium carbonate (OS-CAL) 600 MG TABS tablet   Oral   Take 600 mg by mouth daily.         . Cholecalciferol (VITAMIN D) 400 UNITS capsule   Oral   Take 800 Units by mouth daily.         Marland Kitchen co-enzyme Q-10 30 MG capsule   Oral   Take 30 mg by mouth daily.         . diazepam (VALIUM) 10 MG tablet      TAKE 1/2 A TABLET BY MOUTH TWICE DAILY AS NEEDED FOR ANXIETY OR SLEEP   30 tablet   0   . losartan-hydrochlorothiazide (HYZAAR) 50-12.5 MG per tablet      TAKE 1 TABLET BY MOUTH ONCE A DAY   90 tablet   3   . Omega-3 Fatty Acids (FISH OIL) 1000 MG CAPS   Oral   Take 1 capsule by mouth daily.         . traMADol (ULTRAM) 50 MG tablet      TAKE 1 TABLET BY MOUTH TWICE A DAY   30 tablet   0   . vitamin C (ASCORBIC ACID) 500 MG tablet   Oral   Take 500 mg by mouth  daily.         . estradiol (ESTRACE) 1 MG tablet   Oral   Take 0.5 mg by mouth daily.           Allergies Morphine and related  Family History  Problem Relation Age of Onset  . Alzheimer's disease Mother   . Alcohol abuse Father   . COPD Brother   . Heart disease Neg Hx   . Cancer Neg Hx     Social History History  Substance Use Topics  . Smoking status: Never Smoker   . Smokeless tobacco: Never Used  . Alcohol Use: No    Review of Systems Constitutional: Positive for fever/chills Eyes: No visual changes. ENT: No sore throat. Cardiovascular: Denies chest pain. Respiratory: Denies shortness of breath. Gastrointestinal: No abdominal pain.  No nausea, no vomiting.  No diarrhea.  No constipation. Genitourinary: Positive for urinary frequency. Negative for dysuria. Musculoskeletal: Negative for back pain. Skin: Negative for rash. Neurological: Positive for headache. Negative for focal weakness or numbness.  10-point ROS otherwise  negative.  ____________________________________________   PHYSICAL EXAM:  VITAL SIGNS: ED Triage Vitals  Enc Vitals Group     BP 10/30/14 2320 137/64 mmHg     Pulse Rate 10/30/14 2320 111     Resp 10/30/14 2320 18     Temp 10/30/14 2320 99.8 F (37.7 C)     Temp Source 10/30/14 2320 Oral     SpO2 10/30/14 2320 94 %     Weight 10/30/14 2320 130 lb 11.2 oz (59.285 kg)     Height 10/30/14 2320 5\' 1"  (1.549 m)     Head Cir --      Peak Flow --      Pain Score --      Pain Loc --      Pain Edu? --      Excl. in [place]? --     Constitutional: Alert and oriented. Well appearing and in no acute distress. Eyes: Conjunctivae are normal. PERRL. EOMI. Head: Atraumatic. Nose: No congestion/rhinnorhea. Mouth/Throat: Mucous membranes are mildly dry.  Oropharynx non-erythematous. Neck: No stridor. Supple neck without evidence of meningismus. Cardiovascular: Normal rate, regular rhythm. Grossly normal heart sounds.  Good peripheral circulation. Respiratory: Normal respiratory effort.  No retractions. Lungs CTAB. Gastrointestinal: Soft and nontender. No distention. No abdominal bruits. No CVA tenderness. Musculoskeletal: No lower extremity tenderness nor edema.  No joint effusions. Neurologic:  Normal speech and language. No gross focal neurologic deficits are appreciated. Speech is normal. No gait instability. Skin:  Skin is warm, dry and intact. No rash noted. Psychiatric: Mood and affect are normal. Speech and behavior are normal.  ____________________________________________   LABS (all labs ordered are listed, but only abnormal results are displayed)  Labs Reviewed  CBC - Abnormal; Notable for the following:    WBC 17.3 (*)    All other components within normal limits  URINALYSIS COMPLETEWITH MICROSCOPIC ([facility] ONLY) - Abnormal; Notable for the following:    Color, Urine YELLOW (*)    APPearance HAZY (*)    Hgb urine dipstick 2+ (*)    Protein, ur 30 (*)    Nitrite POSITIVE (*)     Leukocytes, UA 2+ (*)    Bacteria, UA FEW (*)    Squamous Epithelial / LPF 0-5 (*)    All other components within normal limits  COMPREHENSIVE METABOLIC PANEL - Abnormal; Notable for the following:    Sodium 133 (*)    Chloride 100 (*)  Glucose, Bld 129 (*)    Creatinine, Ser 1.14 (*)    GFR calc non Af Amer 46 (*)    GFR calc Af Amer 53 (*)    All other components within normal limits  URINE CULTURE  CULTURE, BLOOD (ROUTINE X 2)  CULTURE, BLOOD (ROUTINE X 2)  TROPONIN I   ____________________________________________  EKG  None ____________________________________________  RADIOLOGY  Portable chest x-ray (viewed by me, interpreted per Dr. Pascal Lux): Negative portable chest. ____________________________________________   PROCEDURES  Procedure(s) performed: None  Critical Care performed: No  ____________________________________________   INITIAL IMPRESSION / ASSESSMENT AND PLAN / ED COURSE  Pertinent labs & imaging results that were available during my care of the patient were reviewed by me and considered in my medical decision making (see chart for details).  74 year old female who presents with fever, chills and myalgias. Currently resting comfortably with normal heart rate tolerating PO fluids. Will check screening labs, urinalysis and chest x-ray.  ----------------------------------------- 4:46 AM on 10/31/2014 -----------------------------------------  Patient resting in no acute distress. Laboratory and urinalysis results notable for leukocytosis with UTI. Patient was an extremely difficult venous stick for lab work so IM Rocephin was administered. Will continue patient on Septra DS; urine culture pending. Patient will follow-up with PCP early next week. Strict return precautions given. Patient verbalizes understanding and agrees with plan of care. ____________________________________________   FINAL CLINICAL IMPRESSION(S) / ED DIAGNOSES  Final  diagnoses:  Fever, unspecified fever cause  UTI (lower urinary tract infection)      Paulette Blanch, MD 10/31/14 [phone]

## 2014-11-02 LAB — URINE CULTURE

## 2014-11-05 LAB — CULTURE, BLOOD (ROUTINE X 2)
Culture: NO GROWTH
Culture: NO GROWTH
Specimen Description: 1

## 2014-11-08 ENCOUNTER — Ambulatory Visit: Payer: Commercial Managed Care - HMO | Admitting: Internal Medicine

## 2014-11-10 DIAGNOSIS — Z1231 Encounter for screening mammogram for malignant neoplasm of breast: Secondary | ICD-10-CM | POA: Diagnosis not present

## 2014-11-12 ENCOUNTER — Encounter: Payer: Self-pay | Admitting: Internal Medicine

## 2014-11-12 ENCOUNTER — Ambulatory Visit (INDEPENDENT_AMBULATORY_CARE_PROVIDER_SITE_OTHER): Payer: Commercial Managed Care - HMO | Admitting: Internal Medicine

## 2014-11-12 VITALS — BP 110/70 | HR 74 | Temp 97.5°F | Wt 129.0 lb

## 2014-11-12 DIAGNOSIS — N390 Urinary tract infection, site not specified: Secondary | ICD-10-CM | POA: Insufficient documentation

## 2014-11-12 DIAGNOSIS — R300 Dysuria: Secondary | ICD-10-CM

## 2014-11-12 DIAGNOSIS — R921 Mammographic calcification found on diagnostic imaging of breast: Secondary | ICD-10-CM | POA: Diagnosis not present

## 2014-11-12 DIAGNOSIS — R928 Other abnormal and inconclusive findings on diagnostic imaging of breast: Secondary | ICD-10-CM | POA: Diagnosis not present

## 2014-11-12 LAB — POCT URINALYSIS DIPSTICK
Bilirubin, UA: NEGATIVE
Blood, UA: NEGATIVE
Glucose, UA: NEGATIVE
KETONES UA: NEGATIVE
Nitrite, UA: NEGATIVE
PROTEIN UA: NEGATIVE
SPEC GRAV UA: 1.025
Urobilinogen, UA: NEGATIVE
pH, UA: 6

## 2014-11-12 NOTE — Patient Instructions (Signed)
If you get urinary symptoms, it is okay to try increasing fluids and cranberry tablets for 1-2 days. If you are not better than, you should come in for evaluation (don't wait too long)

## 2014-11-12 NOTE — Progress Notes (Signed)
Subjective:    Patient ID: Katie Bell, female    DOB: 1940/10/27, 74 y.o.   MRN: [id number]  HPI Here for ER follow up  Seen 7/10 Had fever and UTI E coli--- pan-sensitive Burning dysuria is better now No fever Finished 10 days of septra  Occasional intercourse---not clearly related Careful about cleaning  Current Outpatient Prescriptions on File Prior to Visit  Medication Sig Dispense Refill  . calcium carbonate (OS-CAL) 600 MG TABS tablet Take 600 mg by mouth daily.    . Cholecalciferol (VITAMIN D) 400 UNITS capsule Take 800 Units by mouth daily.    . diazepam (VALIUM) 10 MG tablet TAKE 1/2 A TABLET BY MOUTH TWICE DAILY AS NEEDED FOR ANXIETY OR SLEEP 30 tablet 0  . losartan-hydrochlorothiazide (HYZAAR) 50-12.5 MG per tablet TAKE 1 TABLET BY MOUTH ONCE A DAY 90 tablet 3  . Omega-3 Fatty Acids (FISH OIL) 1000 MG CAPS Take 1 capsule by mouth daily.    . traMADol (ULTRAM) 50 MG tablet TAKE 1 TABLET BY MOUTH TWICE A DAY 30 tablet 0  . vitamin C (ASCORBIC ACID) 500 MG tablet Take 500 mg by mouth daily.     No current facility-administered medications on file prior to visit.    Allergies  Allergen Reactions  . Morphine And Related Anxiety    Past Medical History  Diagnosis Date  . Hypertension   . Gastric ulcer     History  . Episodic mood disorder     mostly anxiety--depression in the past  . Postmenopausal disorder   . Anxiety   . Depression   . SVD (spontaneous vaginal delivery)     x 3  . History of kidney stones 1960  . Osteoarthritis, hand     hand  . History of blood transfusion     At [facility] appro 15 yrs ago    Past Surgical History  Procedure Laterality Date  . Tonsillectomy  1944  . Cholecystectomy  2008  . Colonoscopy  04/2011  . Wisdom tooth extraction    . Upper gi endoscopy    . Kidney stone removed    . Tubal ligation    . Diagnostic laparoscopy    . Cystocele repair N/A 12/31/2013    Procedure: ANTERIOR REPAIR  (CYSTOCELE);  Surgeon: Donnamae Jude, MD;  Location: [facility];  Service: Gynecology;  Laterality: N/A;    Family History  Problem Relation Age of Onset  . Alzheimer's disease Mother   . Alcohol abuse Father   . COPD Brother   . Heart disease Neg Hx   . Cancer Neg Hx     History   Social History  . Marital Status: Divorced    Spouse Name: N/A  . Number of Children: 3  . Years of Education: N/A   Occupational History  . LPN--retired     personal care on the side   Social History Main Topics  . Smoking status: Never Smoker   . Smokeless tobacco: Never Used  . Alcohol Use: No  . Drug Use: No  . Sexual Activity: Yes    Birth Control/ Protection: Post-menopausal   Other Topics Concern  . Not on file   Social History Narrative   3 sons--- middle one lives with her   Goes by Katie Bell      No living will   Son Katie Bell should make decisions for her   Would accept resuscitation attempts but no prolonged ventilation   No tube feeds if  cognitively unaware         Review of Systems Can't tolerate cranberry juice Appetite is okay No N/V    Objective:   Physical Exam  Abdominal: Soft. She exhibits no distension. There is no tenderness. There is no rebound and no guarding.  Has some suprapubic tenderness  Musculoskeletal:  No CVA          Assessment & Plan:

## 2014-11-12 NOTE — Assessment & Plan Note (Addendum)
She had 1-2 weeks of urinary symptoms and hoped it would get better Had chills and fever so needed ER Blood cultures all negative Did finally have resolution of symptoms Hasn't had frequent infections Urinalysis still shows some leuks---but no nitrite. After systemic infection, not sure this has much significance  Discussed brief trial if increased fluids/cranberry if recurs. But eval if not better in 1-2 days

## 2014-11-12 NOTE — Progress Notes (Signed)
Pre visit review using our clinic review tool, if applicable. No additional management support is needed unless otherwise documented below in the visit note. 

## 2014-11-16 ENCOUNTER — Other Ambulatory Visit: Payer: Self-pay | Admitting: Internal Medicine

## 2014-11-16 NOTE — Telephone Encounter (Signed)
Last filled 10/06/14 LETVAK PATIENT, Please send back to me for call in

## 2014-11-17 NOTE — Telephone Encounter (Signed)
Ok to phone in tramadol 

## 2014-11-17 NOTE — Telephone Encounter (Signed)
rx called into pharmacy

## 2014-12-02 ENCOUNTER — Other Ambulatory Visit: Payer: Self-pay | Admitting: Internal Medicine

## 2014-12-02 NOTE — Telephone Encounter (Signed)
10/21/14 

## 2014-12-02 NOTE — Telephone Encounter (Signed)
Approved:  Okay #30 x 0

## 2014-12-03 NOTE — Telephone Encounter (Signed)
rx called into pharmacy

## 2014-12-22 ENCOUNTER — Other Ambulatory Visit: Payer: Self-pay | Admitting: Internal Medicine

## 2014-12-23 NOTE — Telephone Encounter (Signed)
rx called into pharmacy

## 2014-12-23 NOTE — Telephone Encounter (Signed)
Last filled 11/17/14 #30--please advise

## 2014-12-23 NOTE — Telephone Encounter (Signed)
Approved: okay #[id number] x 0 

## 2015-01-12 ENCOUNTER — Other Ambulatory Visit: Payer: Self-pay | Admitting: Internal Medicine

## 2015-01-12 DIAGNOSIS — L578 Other skin changes due to chronic exposure to nonionizing radiation: Secondary | ICD-10-CM | POA: Diagnosis not present

## 2015-01-12 DIAGNOSIS — L812 Freckles: Secondary | ICD-10-CM | POA: Diagnosis not present

## 2015-01-12 DIAGNOSIS — D229 Melanocytic nevi, unspecified: Secondary | ICD-10-CM | POA: Diagnosis not present

## 2015-01-12 DIAGNOSIS — L821 Other seborrheic keratosis: Secondary | ICD-10-CM | POA: Diagnosis not present

## 2015-01-12 DIAGNOSIS — D1800 Hemangioma unspecified site: Secondary | ICD-10-CM | POA: Diagnosis not present

## 2015-01-12 DIAGNOSIS — Z1283 Encounter for screening for malignant neoplasm of skin: Secondary | ICD-10-CM | POA: Diagnosis not present

## 2015-01-12 DIAGNOSIS — Z85820 Personal history of malignant melanoma of skin: Secondary | ICD-10-CM | POA: Diagnosis not present

## 2015-01-13 NOTE — Telephone Encounter (Signed)
12/03/14 

## 2015-01-13 NOTE — Telephone Encounter (Signed)
rx called into pharmacy

## 2015-01-13 NOTE — Telephone Encounter (Signed)
Approved: 30 x 0 

## 2015-01-26 ENCOUNTER — Other Ambulatory Visit: Payer: Self-pay | Admitting: Internal Medicine

## 2015-01-27 NOTE — Telephone Encounter (Signed)
Approved: 30 x 0 

## 2015-01-27 NOTE — Telephone Encounter (Signed)
rx called into pharmacy

## 2015-01-27 NOTE — Telephone Encounter (Signed)
12/23/2014 

## 2015-02-22 ENCOUNTER — Other Ambulatory Visit: Payer: Self-pay | Admitting: Internal Medicine

## 2015-02-22 ENCOUNTER — Encounter: Payer: Commercial Managed Care - HMO | Admitting: Internal Medicine

## 2015-02-22 NOTE — Telephone Encounter (Signed)
VALIUM 01/13/2015 TRAMADOL 01/27/2015

## 2015-02-22 NOTE — Telephone Encounter (Signed)
Approved: #30 x 0 for each 

## 2015-02-22 NOTE — Telephone Encounter (Signed)
rx called into pharmacy

## 2015-02-23 DIAGNOSIS — H40001 Preglaucoma, unspecified, right eye: Secondary | ICD-10-CM | POA: Diagnosis not present

## 2015-03-28 ENCOUNTER — Other Ambulatory Visit: Payer: Self-pay | Admitting: Internal Medicine

## 2015-03-28 NOTE — Telephone Encounter (Signed)
02/22/2015 

## 2015-03-28 NOTE — Telephone Encounter (Signed)
Approved: 30 x 0 

## 2015-03-28 NOTE — Telephone Encounter (Signed)
rx called into pharmacy

## 2015-04-09 ENCOUNTER — Other Ambulatory Visit: Payer: Self-pay | Admitting: Internal Medicine

## 2015-04-11 NOTE — Telephone Encounter (Signed)
rx called into pharmacy

## 2015-04-11 NOTE — Telephone Encounter (Signed)
Last filled 02/22/2015 LETVAK PATIENT, Please send back to me for call in

## 2015-04-11 NOTE — Telephone Encounter (Signed)
Ok to refill as requested 

## 2015-05-11 ENCOUNTER — Other Ambulatory Visit: Payer: Self-pay | Admitting: Internal Medicine

## 2015-05-11 NOTE — Telephone Encounter (Signed)
rx called into pharmacy

## 2015-05-11 NOTE — Telephone Encounter (Signed)
03/28/2015 

## 2015-05-11 NOTE — Telephone Encounter (Signed)
Approved: 30 x 0 

## 2015-05-18 ENCOUNTER — Other Ambulatory Visit: Payer: Self-pay | Admitting: *Deleted

## 2015-05-18 NOTE — Telephone Encounter (Signed)
04/11/2015 

## 2015-05-19 MED ORDER — DIAZEPAM 10 MG PO TABS: 5.0000 mg | ORAL_TABLET | Freq: Two times a day (BID) | ORAL | Status: DC | PRN

## 2015-05-19 NOTE — Telephone Encounter (Signed)
Approved: 30 x 0 

## 2015-05-19 NOTE — Telephone Encounter (Signed)
rx called into pharmacy

## 2015-06-09 ENCOUNTER — Encounter: Payer: Self-pay | Admitting: Family Medicine

## 2015-06-09 ENCOUNTER — Ambulatory Visit (INDEPENDENT_AMBULATORY_CARE_PROVIDER_SITE_OTHER): Payer: Commercial Managed Care - HMO | Admitting: Family Medicine

## 2015-06-09 VITALS — BP 122/71 | HR 74 | Resp 16 | Ht 61.0 in | Wt 133.0 lb

## 2015-06-09 DIAGNOSIS — N815 Vaginal enterocele: Secondary | ICD-10-CM | POA: Diagnosis not present

## 2015-06-09 DIAGNOSIS — N3090 Cystitis, unspecified without hematuria: Secondary | ICD-10-CM | POA: Diagnosis not present

## 2015-06-09 NOTE — Patient Instructions (Signed)
Menopause Menopause is the normal time of life when menstrual periods stop completely. Menopause is complete when you have missed 12 consecutive menstrual periods. It usually occurs between the ages of 48 years and 55 years. Very rarely does a woman develop menopause before the age of 40 years. At menopause, your ovaries stop producing the female hormones estrogen and progesterone. This can cause undesirable symptoms and also affect your health. Sometimes the symptoms may occur 4-5 years before the menopause begins. There is no relationship between menopause and:  Oral contraceptives.  Number of children you had.  Race.  The age your menstrual periods started (menarche). Heavy smokers and very thin women may develop menopause earlier in life. CAUSES  The ovaries stop producing the female hormones estrogen and progesterone.  Other causes include:  Surgery to remove both ovaries.  The ovaries stop functioning for no known reason.  Tumors of the pituitary gland in the brain.  Medical disease that affects the ovaries and hormone production.  Radiation treatment to the abdomen or pelvis.  Chemotherapy that affects the ovaries. SYMPTOMS   Hot flashes.  Night sweats.  Decrease in sex drive.  Vaginal dryness and thinning of the vagina causing painful intercourse.  Dryness of the skin and developing wrinkles.  Headaches.  Tiredness.  Irritability.  Memory problems.  Weight gain.  Bladder infections.  Hair growth of the face and chest.  Infertility. More serious symptoms include:  Loss of bone (osteoporosis) causing breaks (fractures).  Depression.  Hardening and narrowing of the arteries (atherosclerosis) causing heart attacks and strokes. DIAGNOSIS   When the menstrual periods have stopped for 12 straight months.  Physical exam.  Hormone studies of the blood. TREATMENT  There are many treatment choices and nearly as many questions about them. The  decisions to treat or not to treat menopausal changes is an individual choice made with your health care provider. Your health care provider can discuss the treatments with you. Together, you can decide which treatment will work best for you. Your treatment choices may include:   Hormone therapy (estrogen and progesterone).  Non-hormonal medicines.  Treating the individual symptoms with medicine (for example antidepressants for depression).  Herbal medicines that may help specific symptoms.  Counseling by a psychiatrist or psychologist.  Group therapy.  Lifestyle changes including:  Eating healthy.  Regular exercise.  Limiting caffeine and alcohol.  Stress management and meditation.  No treatment. HOME CARE INSTRUCTIONS   Take the medicine your health care provider gives you as directed.  Get plenty of sleep and rest.  Exercise regularly.  Eat a diet that contains calcium (good for the bones) and soy products (acts like estrogen hormone).  Avoid alcoholic beverages.  Do not smoke.  If you have hot flashes, dress in layers.  Take supplements, calcium, and vitamin D to strengthen bones.  You can use over-the-counter lubricants or moisturizers for vaginal dryness.  Group therapy is sometimes very helpful.  Acupuncture may be helpful in some cases. SEEK MEDICAL CARE IF:   You are not sure you are in menopause.  You are having menopausal symptoms and need advice and treatment.  You are still having menstrual periods after age 55 years.  You have pain with intercourse.  Menopause is complete (no menstrual period for 12 months) and you develop vaginal bleeding.  You need a referral to a specialist (gynecologist, psychiatrist, or psychologist) for treatment. SEEK IMMEDIATE MEDICAL CARE IF:   You have severe depression.  You have excessive vaginal bleeding.    You fell and think you have a broken bone.  You have pain when you urinate.  You develop leg or  chest pain.  You have a fast pounding heart beat (palpitations).  You have severe headaches.  You develop vision problems.  You feel a lump in your breast.  You have abdominal pain or severe indigestion.   This information is not intended to replace advice given to you by your health care provider. Make sure you discuss any questions you have with your health care provider.   Document Released: 06/30/2003 Document Revised: 12/10/2012 Document Reviewed: 11/06/2012 Elsevier Interactive Patient Education 2016 Elsevier Inc.  

## 2015-06-09 NOTE — Assessment & Plan Note (Signed)
Symptoms likely related to menopause - advised of good hygiene, bladder emptying after intercourse, push water.

## 2015-06-09 NOTE — Progress Notes (Signed)
    Subjective:    Patient ID: Katie Bell is a 75 y.o. female presenting with Follow-up  on 06/09/2015  HPI: Feels like she has a cyst or something in her vagina.  She says it does not feel like her bladder.  Has frequent yeast infections. Has some dysuria.  Review of Systems  Constitutional: Negative for fever, chills and unexpected weight change.  Respiratory: Negative for shortness of breath.   Cardiovascular: Negative for chest pain.  Gastrointestinal: Negative for nausea, vomiting and abdominal pain.  Genitourinary: Positive for dysuria. Negative for hematuria and vaginal bleeding.  Skin: Negative for rash.      Objective:    BP 122/71 mmHg  Pulse 74  Resp 16  Ht 5\' 1"  (1.549 m)  Wt 133 lb (60.328 kg)  BMI 25.14 kg/m2 Physical Exam  Constitutional: She appears well-developed and well-nourished.  Cardiovascular: Normal rate.   Pulmonary/Chest: Effort normal.  Abdominal: Soft.  Genitourinary:  Good support of the bladder and uterus.  There is small bulging anterior to the uterus with valsalva but does not come outside of the vagina. Cervix appears normal.        Assessment & Plan:   Problem List Items Addressed This Visit      Unprioritized   Cystitis    Symptoms likely related to menopause - advised of good hygiene, bladder emptying after intercourse, push water.       Other Visit Diagnoses    Vaginal enterocele    -  Primary    mild - would not do any surgical repair at this stage.       Total face-to-face time with patient: 15 minutes. Over 50% of encounter was spent on counseling and coordination of care. Return in about 3 months (around 09/06/2015) for a follow-up.  PRATT,TANYA S 06/09/2015 9:12 AM

## 2015-07-01 ENCOUNTER — Other Ambulatory Visit: Payer: Self-pay | Admitting: Internal Medicine

## 2015-07-01 NOTE — Telephone Encounter (Signed)
Approved: #30 x 0 for each 

## 2015-07-01 NOTE — Telephone Encounter (Signed)
Refills called in on voicemail at the pharmacy

## 2015-07-01 NOTE — Telephone Encounter (Signed)
Diazepam Last Refill 05-19-15 #30 Tramadol Last Refill 05-11-15 #30 Last OV 06-09-15. Next ov 08-26-15

## 2015-08-15 ENCOUNTER — Other Ambulatory Visit: Payer: Self-pay | Admitting: Internal Medicine

## 2015-08-16 NOTE — Telephone Encounter (Signed)
Approved: #30 x 0 for both 

## 2015-08-16 NOTE — Telephone Encounter (Signed)
Left refills on voice mail at pharmacy  

## 2015-08-16 NOTE — Telephone Encounter (Signed)
Both last filled 07-01-15 #30. Last 2 OVs Acute 05-31-14 & 11-12-14 CPE 02-16-14. No future OV scheduled

## 2015-08-26 ENCOUNTER — Ambulatory Visit (INDEPENDENT_AMBULATORY_CARE_PROVIDER_SITE_OTHER): Payer: Commercial Managed Care - HMO | Admitting: Internal Medicine

## 2015-08-26 ENCOUNTER — Encounter: Payer: Self-pay | Admitting: Internal Medicine

## 2015-08-26 VITALS — BP 138/88 | HR 72 | Temp 98.0°F | Ht 61.0 in | Wt 126.0 lb

## 2015-08-26 DIAGNOSIS — Z0000 Encounter for general adult medical examination without abnormal findings: Secondary | ICD-10-CM | POA: Diagnosis not present

## 2015-08-26 DIAGNOSIS — I10 Essential (primary) hypertension: Secondary | ICD-10-CM | POA: Diagnosis not present

## 2015-08-26 DIAGNOSIS — K257 Chronic gastric ulcer without hemorrhage or perforation: Secondary | ICD-10-CM

## 2015-08-26 DIAGNOSIS — Z23 Encounter for immunization: Secondary | ICD-10-CM

## 2015-08-26 DIAGNOSIS — N3090 Cystitis, unspecified without hematuria: Secondary | ICD-10-CM

## 2015-08-26 DIAGNOSIS — F39 Unspecified mood [affective] disorder: Secondary | ICD-10-CM

## 2015-08-26 DIAGNOSIS — Z7189 Other specified counseling: Secondary | ICD-10-CM

## 2015-08-26 LAB — LIPID PANEL
Cholesterol: 225 mg/dL — ABNORMAL HIGH (ref 0–200)
HDL: 68.5 mg/dL (ref >39.00)
LDL CALC: 131 mg/dL — AB (ref 0–99)
NONHDL: 156.53
Total CHOL/HDL Ratio: 3
Triglycerides: 126 mg/dL (ref 0.0–149.0)
VLDL: 25.2 mg/dL (ref 0.0–40.0)

## 2015-08-26 LAB — CBC WITH DIFFERENTIAL/PLATELET
BASOS ABS: 0.1 10*3/uL (ref 0.0–0.1)
Basophils Relative: 0.8 % (ref 0.0–3.0)
EOS PCT: 4.1 % (ref 0.0–5.0)
Eosinophils Absolute: 0.3 10*3/uL (ref 0.0–0.7)
HEMATOCRIT: 45.5 % (ref 36.0–46.0)
HEMOGLOBIN: 15.5 g/dL — AB (ref 12.0–15.0)
LYMPHS PCT: 27.5 % (ref 12.0–46.0)
Lymphs Abs: 2 10*3/uL (ref 0.7–4.0)
MCHC: 34.1 g/dL (ref 30.0–36.0)
MCV: 92.1 fl (ref 78.0–100.0)
MONOS PCT: 8.4 % (ref 3.0–12.0)
Monocytes Absolute: 0.6 10*3/uL (ref 0.1–1.0)
NEUTROS PCT: 59.2 % (ref 43.0–77.0)
Neutro Abs: 4.3 10*3/uL (ref 1.4–7.7)
Platelets: 260 10*3/uL (ref 150.0–400.0)
RBC: 4.94 Mil/uL (ref 3.87–5.11)
RDW: 12.2 % (ref 11.5–15.5)
WBC: 7.3 10*3/uL (ref 4.0–10.5)

## 2015-08-26 LAB — COMPREHENSIVE METABOLIC PANEL
ALK PHOS: 52 U/L (ref 39–117)
ALT: 12 U/L (ref 0–35)
AST: 19 U/L (ref 0–37)
Albumin: 4.8 g/dL (ref 3.5–5.2)
BILIRUBIN TOTAL: 0.8 mg/dL (ref 0.2–1.2)
BUN: 11 mg/dL (ref 6–23)
CALCIUM: 10.5 mg/dL (ref 8.4–10.5)
CO2: 25 mEq/L (ref 19–32)
Chloride: 95 mEq/L — ABNORMAL LOW (ref 96–112)
Creatinine, Ser: 0.92 mg/dL (ref 0.40–1.20)
GFR: 63.08 mL/min (ref >60.00)
Glucose, Bld: 97 mg/dL (ref 70–99)
Potassium: 3.9 mEq/L (ref 3.5–5.1)
Sodium: 132 mEq/L — ABNORMAL LOW (ref 135–145)
TOTAL PROTEIN: 8.1 g/dL (ref 6.0–8.3)

## 2015-08-26 LAB — POC URINALSYSI DIPSTICK (AUTOMATED)
BILIRUBIN UA: NEGATIVE
Blood, UA: NEGATIVE
Glucose, UA: NEGATIVE
KETONES UA: NEGATIVE
NITRITE UA: POSITIVE
Protein, UA: NEGATIVE
Spec Grav, UA: 1.015
Urobilinogen, UA: 4
pH, UA: 7

## 2015-08-26 LAB — T4, FREE: Free T4: 0.88 ng/dL (ref 0.60–1.60)

## 2015-08-26 MED ORDER — SUCRALFATE 1 G PO TABS: 1.0000 g | ORAL_TABLET | Freq: Three times a day (TID) | ORAL | Status: DC

## 2015-08-26 NOTE — Addendum Note (Signed)
Addended by: Pilar Grammes on: 08/26/2015 02:07 PM   Modules accepted: Orders

## 2015-08-26 NOTE — Assessment & Plan Note (Signed)
I have personally reviewed the Medicare Annual Wellness questionnaire and have noted 1. The patient's medical and social history 2. Their use of alcohol, tobacco or illicit drugs 3. Their current medications and supplements 4. The patient's functional ability including ADL's, fall risks, home safety risks and hearing or visual             impairment. 5. Diet and physical activities 6. Evidence for depression or mood disorders  The patients weight, height, BMI and visual acuity have been recorded in the chart I have made referrals, counseling and provided education to the patient based review of the above and I have provided the pt with a written personalized care plan for preventive services.  I have provided you with a copy of your personalized plan for preventive services. Please take the time to review along with your updated medication list.  Pneumovax today She wants to continue mammograms-- every 2 years till 65 Likely won't need colon again Yearly flu vaccine

## 2015-08-26 NOTE — Patient Instructions (Signed)
Please start the sucralfate before meals and at bedtime. If your symptoms go away, you can slowly cut back to only taking it at bedtime.

## 2015-08-26 NOTE — Assessment & Plan Note (Signed)
Recurrent mild symptoms No evidence of infection now Discussed increased fluids

## 2015-08-26 NOTE — Assessment & Plan Note (Signed)
Chronic anxiety and dysthymia Just uses the diazepam at bedtime

## 2015-08-26 NOTE — Progress Notes (Signed)
Subjective:    Patient ID: Katie Bell, female    DOB: [birth year], 75 y.o.   MRN: [id number]  HPI Here for Medicare wellness and follow up of chronic medical conditions Reviewed form and advanced directives Reviewed other doctors--dentist (?), eye doctor ([place]) Vision is good. Hearing is fine No alcohol or tobacco Does some exercise---exercise bike and some light weights. Will walk at times [facility] once this year--just stumbled down garage steps, no injury Chronic mood issues Independent with instrumental ADLs No obvious cognitive problems  Having some epigastric pain at times--reminds her of past ulcer Notices it at night frequently when she lies down---- will take aloe vera or left over sucralfate No heartburn May be more noticeable before eating--not after She is concerned about PPI--thinks it increases chance of cancer Hurts in back also---seems to be more if she has prolonged sitting  Still has some urinary burning Better after emptying Doesn't think she has infection Rare intercourse  BP has been okay No chest pain No SOB No dizziness or syncope No edema  Uses the 1/2 valium at night Chronically nervous--avoids the med in day Chronic depressed mood--"all my life" Not anhedonic. Enjoys church and activities then  Does have some aching in joints at times Tylenol has been effective Only occasionally needs the tramadol  Current Outpatient Prescriptions on File Prior to Visit  Medication Sig Dispense Refill  . calcium carbonate (OS-CAL) 600 MG TABS tablet Take 600 mg by mouth daily.    . Cholecalciferol (VITAMIN D) 400 UNITS capsule Take 800 Units by mouth daily.    . diazepam (VALIUM) 10 MG tablet TAKE 1/2 A TABLET BY MOUTH TWICE DAILY AS NEEDED FOR ANXIETY AND SLEEP 30 tablet 0  . losartan-hydrochlorothiazide (HYZAAR) 50-12.5 MG per tablet TAKE 1 TABLET BY MOUTH ONCE A DAY 90 tablet 3  . Omega-3 Fatty Acids (FISH OIL) 1000 MG CAPS Take 1 capsule by mouth  daily.    . traMADol (ULTRAM) 50 MG tablet TAKE 1 TABLET BY MOUTH TWICE A DAY 30 tablet 0  . vitamin C (ASCORBIC ACID) 500 MG tablet Take 500 mg by mouth daily.     No current facility-administered medications on file prior to visit.    Allergies  Allergen Reactions  . Morphine And Related Anxiety    Past Medical History  Diagnosis Date  . Hypertension   . Gastric ulcer     History  . Episodic mood disorder ([place])     mostly anxiety--depression in the past  . Postmenopausal disorder   . Anxiety   . Depression   . SVD (spontaneous vaginal delivery)     x 3  . History of kidney stones 1960  . Osteoarthritis, hand     hand  . History of blood transfusion     At [facility] appro [facility] yrs ago    Past Surgical History  Procedure Laterality Date  . Tonsillectomy  1944  . Cholecystectomy  2008  . Colonoscopy  04/2011  . Wisdom tooth extraction    . Upper gi endoscopy    . Kidney stone removed    . Tubal ligation    . Diagnostic laparoscopy    . Cystocele repair N/A 12/31/2013    Procedure: ANTERIOR REPAIR (CYSTOCELE);  Surgeon: Donnamae Jude, MD;  Location: [facility];  Service: Gynecology;  Laterality: N/A;  . Melanoma excision  2015    left posterior thigh    Family History  Problem Relation Age of Onset  .  Alzheimer's disease Mother   . Alcohol abuse Father   . COPD Brother   . Heart disease Neg Hx   . Cancer Neg Hx     Social History   Social History  . Marital Status: Divorced    Spouse Name: N/A  . Number of Children: 3  . Years of Education: N/A   Occupational History  . LPN--retired     personal care on the side   Social History Main Topics  . Smoking status: Never Smoker   . Smokeless tobacco: Never Used  . Alcohol Use: No  . Drug Use: No  . Sexual Activity: Yes    Birth Control/ Protection: Post-menopausal   Other Topics Concern  . Not on file   Social History Narrative   3 sons--- middle one lives with her   Goes by Katie Bell       No living will   Son Katie Bell should make decisions for her   Would accept resuscitation attempts but no prolonged ventilation   No tube feeds if cognitively unaware         Review of Systems Working on healthier eating. Weight down 7# Always wears seat belt Teeth okay Bowels are fine. No blood in stool Ongoing urine problems Sees dermatologist regularly Katie Bell)    Objective:   Physical Exam  Constitutional: She is oriented to person, place, and time. She appears well-developed and well-nourished. No distress.  HENT:  Mouth/Throat: Oropharynx is clear and moist. No oropharyngeal exudate.  Neck: Normal range of motion. Neck supple. No thyromegaly present.  Cardiovascular: Normal rate, regular rhythm, normal heart sounds and intact distal pulses.  Exam reveals no gallop.   No murmur heard. Pulmonary/Chest: Effort normal and breath sounds normal. No respiratory distress. She has no wheezes. She has no rales.  Abdominal: Soft. She exhibits no distension and no mass. There is no rebound and no guarding.  Mild to moderate epigastric tenderness  Musculoskeletal: She exhibits no edema or tenderness.  Lymphadenopathy:    She has no cervical adenopathy.  Neurological: She is alert and oriented to person, place, and time.  President-- "Katie Bell" [phone] [id number] Recall 3/3  Skin: No rash noted. No erythema.  Psychiatric: She has a normal mood and affect. Her behavior is normal.          Assessment & Plan:

## 2015-08-26 NOTE — Assessment & Plan Note (Signed)
See social history Blank forms done 

## 2015-08-26 NOTE — Assessment & Plan Note (Signed)
BP Readings from Last 3 Encounters:  08/26/15 138/88  06/09/15 122/71  11/12/14 110/70   Acceptable control

## 2015-08-26 NOTE — Assessment & Plan Note (Signed)
Classic symptoms of recurrence She is afraid of PPI--will use the carafate again Check for H pylori---discussed 2 week PPI regimen if positive

## 2015-09-08 ENCOUNTER — Ambulatory Visit: Payer: Commercial Managed Care - HMO | Admitting: Obstetrics & Gynecology

## 2015-10-04 ENCOUNTER — Other Ambulatory Visit: Payer: Self-pay | Admitting: Internal Medicine

## 2015-10-04 NOTE — Telephone Encounter (Signed)
Last filled 08-16-15 #30 Last OV 08-26-15 Next OV 10-07-15

## 2015-10-04 NOTE — Telephone Encounter (Signed)
Approved: 30 x 0 

## 2015-10-04 NOTE — Telephone Encounter (Signed)
Left refill on voice mail at pharmacy  

## 2015-10-05 ENCOUNTER — Other Ambulatory Visit: Payer: Self-pay | Admitting: Internal Medicine

## 2015-10-07 ENCOUNTER — Ambulatory Visit (INDEPENDENT_AMBULATORY_CARE_PROVIDER_SITE_OTHER): Payer: Commercial Managed Care - HMO | Admitting: Family Medicine

## 2015-10-07 ENCOUNTER — Encounter: Payer: Self-pay | Admitting: Family Medicine

## 2015-10-07 VITALS — BP 134/77 | HR 79 | Resp 18 | Ht 61.0 in | Wt 124.0 lb

## 2015-10-07 DIAGNOSIS — IMO0002 Reserved for concepts with insufficient information to code with codable children: Secondary | ICD-10-CM

## 2015-10-07 DIAGNOSIS — N8110 Cystocele, unspecified: Secondary | ICD-10-CM

## 2015-10-07 NOTE — Progress Notes (Signed)
   Subjective:    Patient ID: Katie Bell is a 75 y.o. female presenting with Follow-up  on 10/07/2015  HPI: Reports no issues. Previously seen with anterior repair for cystocele. She then thought she had some issues with another swelling in the vagina. Exam at that time revealed mild enterocele.  Review of Systems  Constitutional: Negative for fever and chills.  Respiratory: Negative for shortness of breath.   Cardiovascular: Negative for chest pain.  Gastrointestinal: Negative for nausea, vomiting and abdominal pain.  Genitourinary: Negative for dysuria.  Skin: Negative for rash.      Objective:    BP 134/77 mmHg  Pulse 79  Resp 18  Ht 5\' 1"  (1.549 m)  Wt 124 lb (56.246 kg)  BMI 23.44 kg/m2 Physical Exam  Constitutional: She is oriented to person, place, and time. She appears well-developed and well-nourished. No distress.  HENT:  Head: Normocephalic and atraumatic.  Eyes: No scleral icterus.  Neck: Neck supple.  Cardiovascular: Normal rate.   Pulmonary/Chest: Effort normal.  Abdominal: Soft.  Genitourinary:  Bladder is well supported anteriorly. Mild bulging of anterior vagina 5-6 cm in. Good uterine support. No rectocele.  Neurological: She is alert and oriented to person, place, and time.  Skin: Skin is warm and dry.  Psychiatric: She has a normal mood and affect.        Assessment & Plan:   Problem List Items Addressed This Visit      Unprioritized   Cystocele - Primary    Good surgical outcome, no evidence of failure at this time.          Return in about 6 months (around 04/07/2016) for a follow-up.  Emett Stapel S 10/07/2015 10:58 AM

## 2015-10-07 NOTE — Assessment & Plan Note (Signed)
Good surgical outcome, no evidence of failure at this time.

## 2015-10-14 ENCOUNTER — Other Ambulatory Visit: Payer: Self-pay | Admitting: Internal Medicine

## 2015-10-17 NOTE — Telephone Encounter (Signed)
Ok to ref, 52, 0 ref

## 2015-10-17 NOTE — Telephone Encounter (Signed)
Left refill on voice mail at pharmacy  

## 2015-10-17 NOTE — Telephone Encounter (Signed)
Last filled 08-16-15 #30. Last OV 08-26-15 No Future OV. Can you fill in Dr Alla German absence? Thanks

## 2015-11-16 ENCOUNTER — Other Ambulatory Visit: Payer: Self-pay | Admitting: *Deleted

## 2015-11-16 NOTE — Telephone Encounter (Signed)
Ok to refill in Dr. Letvak's absence? 

## 2015-11-17 MED ORDER — TRAMADOL HCL 50 MG PO TABS: 50.0000 mg | ORAL_TABLET | Freq: Two times a day (BID) | ORAL | 0 refills | Status: DC

## 2015-11-17 NOTE — Telephone Encounter (Signed)
plz phone in. 

## 2015-11-17 NOTE — Telephone Encounter (Signed)
Rx called in as directed.   

## 2015-12-05 ENCOUNTER — Other Ambulatory Visit: Payer: Self-pay

## 2015-12-05 NOTE — Telephone Encounter (Signed)
Last filled 10-17-15 #30 Last OV 08-26-15 No Future OV

## 2015-12-06 MED ORDER — DIAZEPAM 10 MG PO TABS: ORAL_TABLET | ORAL | 0 refills | Status: DC

## 2015-12-06 NOTE — Telephone Encounter (Signed)
Rx called to pharmacy as instructed. 

## 2015-12-06 NOTE — Telephone Encounter (Signed)
Approved: 30 x 0 

## 2015-12-07 DIAGNOSIS — H40003 Preglaucoma, unspecified, bilateral: Secondary | ICD-10-CM | POA: Diagnosis not present

## 2015-12-23 ENCOUNTER — Emergency Department
Admission: EM | Admit: 2015-12-23 | Discharge: 2015-12-23 | Disposition: A | Discharge status: Home / Self Care | Payer: Commercial Managed Care - HMO | Attending: Emergency Medicine | Admitting: Emergency Medicine

## 2015-12-23 ENCOUNTER — Emergency Department: Payer: Commercial Managed Care - HMO

## 2015-12-23 ENCOUNTER — Encounter
Admission: EM | Disposition: A | Discharge status: Home / Self Care | Payer: Self-pay | Source: Home / Self Care | Attending: Emergency Medicine

## 2015-12-23 ENCOUNTER — Emergency Department: Payer: Commercial Managed Care - HMO | Admitting: Anesthesiology

## 2015-12-23 DIAGNOSIS — I10 Essential (primary) hypertension: Secondary | ICD-10-CM | POA: Diagnosis not present

## 2015-12-23 DIAGNOSIS — R1032 Left lower quadrant pain: Secondary | ICD-10-CM | POA: Diagnosis not present

## 2015-12-23 DIAGNOSIS — N200 Calculus of kidney: Secondary | ICD-10-CM | POA: Insufficient documentation

## 2015-12-23 DIAGNOSIS — R309 Painful micturition, unspecified: Secondary | ICD-10-CM | POA: Diagnosis not present

## 2015-12-23 DIAGNOSIS — N390 Urinary tract infection, site not specified: Secondary | ICD-10-CM | POA: Insufficient documentation

## 2015-12-23 DIAGNOSIS — N201 Calculus of ureter: Secondary | ICD-10-CM | POA: Diagnosis not present

## 2015-12-23 DIAGNOSIS — Z79899 Other long term (current) drug therapy: Secondary | ICD-10-CM | POA: Insufficient documentation

## 2015-12-23 DIAGNOSIS — R112 Nausea with vomiting, unspecified: Secondary | ICD-10-CM | POA: Diagnosis not present

## 2015-12-23 DIAGNOSIS — N132 Hydronephrosis with renal and ureteral calculous obstruction: Secondary | ICD-10-CM | POA: Diagnosis not present

## 2015-12-23 DIAGNOSIS — F419 Anxiety disorder, unspecified: Secondary | ICD-10-CM | POA: Diagnosis not present

## 2015-12-23 HISTORY — PX: CYSTOSCOPY WITH STENT PLACEMENT: SHX5790

## 2015-12-23 LAB — URINALYSIS COMPLETE WITH MICROSCOPIC (ARMC ONLY)
Bilirubin Urine: NEGATIVE
Glucose, UA: NEGATIVE mg/dL
KETONES UR: NEGATIVE mg/dL
Nitrite: POSITIVE — AB
PH: 6 (ref 5.0–8.0)
PROTEIN: 30 mg/dL — AB
Specific Gravity, Urine: 1.008 (ref 1.005–1.030)

## 2015-12-23 LAB — CBC
HEMATOCRIT: 42.7 % (ref 35.0–47.0)
HEMOGLOBIN: 15 g/dL (ref 12.0–16.0)
MCH: 32.4 pg (ref 26.0–34.0)
MCHC: 35.1 g/dL (ref 32.0–36.0)
MCV: 92.4 fL (ref 80.0–100.0)
Platelets: 150 10*3/uL (ref 150–440)
RBC: 4.63 MIL/uL (ref 3.80–5.20)
RDW: 12 % (ref 11.5–14.5)
WBC: 8.4 10*3/uL (ref 3.6–11.0)

## 2015-12-23 LAB — COMPREHENSIVE METABOLIC PANEL
ALBUMIN: 4.1 g/dL (ref 3.5–5.0)
ALK PHOS: 54 U/L (ref 38–126)
ALT: 14 U/L (ref 14–54)
ANION GAP: 10 (ref 5–15)
AST: 37 U/L (ref 15–41)
BUN: 16 mg/dL (ref 6–20)
CALCIUM: 9.4 mg/dL (ref 8.9–10.3)
CHLORIDE: 101 mmol/L (ref 101–111)
CO2: 23 mmol/L (ref 22–32)
Creatinine, Ser: 1.15 mg/dL — ABNORMAL HIGH (ref 0.44–1.00)
GFR calc non Af Amer: 45 mL/min — ABNORMAL LOW (ref >60)
GFR, EST AFRICAN AMERICAN: 52 mL/min — AB (ref >60)
GLUCOSE: 130 mg/dL — AB (ref 65–99)
Potassium: 3.5 mmol/L (ref 3.5–5.1)
SODIUM: 134 mmol/L — AB (ref 135–145)
Total Bilirubin: 1.3 mg/dL — ABNORMAL HIGH (ref 0.3–1.2)
Total Protein: 7.5 g/dL (ref 6.5–8.1)

## 2015-12-23 LAB — LIPASE, BLOOD: LIPASE: 29 U/L (ref 11–51)

## 2015-12-23 SURGERY — CYSTOSCOPY, WITH STENT INSERTION
Anesthesia: General | Site: Ureter | Laterality: Left | Wound class: Clean Contaminated

## 2015-12-23 MED ORDER — SODIUM CHLORIDE 0.9 % IV BOLUS (SEPSIS)
500.0000 mL | Freq: Once | INTRAVENOUS | Status: AC
  Administered 2015-12-23: 500 mL via INTRAVENOUS

## 2015-12-23 MED ORDER — FENTANYL CITRATE (PF) 100 MCG/2ML IJ SOLN
75.0000 ug | Freq: Once | INTRAMUSCULAR | Status: AC
  Administered 2015-12-23: 75 ug via INTRAVENOUS

## 2015-12-23 MED ORDER — FENTANYL CITRATE (PF) 100 MCG/2ML IJ SOLN
INTRAMUSCULAR | Status: DC | PRN
  Administered 2015-12-23: 50 ug via INTRAVENOUS

## 2015-12-23 MED ORDER — FENTANYL CITRATE (PF) 100 MCG/2ML IJ SOLN
75.0000 ug | Freq: Once | INTRAMUSCULAR | Status: AC
  Administered 2015-12-23: 75 ug via INTRAVENOUS
  Filled 2015-12-23: qty 2

## 2015-12-23 MED ORDER — SUCCINYLCHOLINE CHLORIDE 20 MG/ML IJ SOLN
INTRAMUSCULAR | Status: DC | PRN
  Administered 2015-12-23: 60 mg via INTRAVENOUS

## 2015-12-23 MED ORDER — IOPAMIDOL (ISOVUE-300) INJECTION 61%
30.0000 mL | Freq: Once | INTRAVENOUS | Status: AC | PRN
  Administered 2015-12-23: 30 mL via ORAL

## 2015-12-23 MED ORDER — FENTANYL CITRATE (PF) 100 MCG/2ML IJ SOLN
INTRAMUSCULAR | Status: AC
  Administered 2015-12-23: 75 ug via INTRAVENOUS
  Filled 2015-12-23: qty 2

## 2015-12-23 MED ORDER — PROPOFOL 10 MG/ML IV BOLUS
INTRAVENOUS | Status: DC | PRN
  Administered 2015-12-23: 120 mg via INTRAVENOUS

## 2015-12-23 MED ORDER — MIDAZOLAM HCL 2 MG/2ML IJ SOLN
INTRAMUSCULAR | Status: DC | PRN
  Administered 2015-12-23: 2 mg via INTRAVENOUS

## 2015-12-23 MED ORDER — HYDROMORPHONE HCL 1 MG/ML IJ SOLN
0.5000 mg | Freq: Once | INTRAMUSCULAR | Status: AC
  Administered 2015-12-23: 0.5 mg via INTRAVENOUS
  Filled 2015-12-23: qty 1

## 2015-12-23 MED ORDER — IOPAMIDOL (ISOVUE-300) INJECTION 61%
75.0000 mL | Freq: Once | INTRAVENOUS | Status: AC | PRN
  Administered 2015-12-23: 75 mL via INTRAVENOUS

## 2015-12-23 MED ORDER — LACTATED RINGERS IV SOLN
INTRAVENOUS | Status: DC | PRN
  Administered 2015-12-23: 18:00:00 via INTRAVENOUS

## 2015-12-23 MED ORDER — SULFAMETHOXAZOLE-TRIMETHOPRIM 800-160 MG PO TABS: 1.0000 | ORAL_TABLET | Freq: Two times a day (BID) | ORAL | 0 refills | Status: DC

## 2015-12-23 MED ORDER — OXYCODONE HCL 5 MG PO TABS: 5.0000 mg | ORAL_TABLET | Freq: Once | ORAL | Status: DC | PRN

## 2015-12-23 MED ORDER — IOPAMIDOL (ISOVUE-300) INJECTION 61%: 75.0000 mL | Freq: Once | INTRAVENOUS | Status: DC | PRN

## 2015-12-23 MED ORDER — TRAMADOL HCL 50 MG PO TABS: 50.0000 mg | ORAL_TABLET | Freq: Two times a day (BID) | ORAL | 0 refills | Status: DC

## 2015-12-23 MED ORDER — IOTHALAMATE MEGLUMINE 43 % IV SOLN
INTRAVENOUS | Status: DC | PRN
  Administered 2015-12-23: 7 mL

## 2015-12-23 MED ORDER — CEFTRIAXONE SODIUM 1 G IJ SOLR
1.0000 g | Freq: Once | INTRAMUSCULAR | Status: AC
  Administered 2015-12-23: 1 g via INTRAVENOUS
  Filled 2015-12-23: qty 10

## 2015-12-23 MED ORDER — ONDANSETRON HCL 4 MG/2ML IJ SOLN
4.0000 mg | Freq: Once | INTRAMUSCULAR | Status: AC
  Administered 2015-12-23: 4 mg via INTRAVENOUS
  Filled 2015-12-23: qty 2

## 2015-12-23 MED ORDER — SODIUM CHLORIDE 0.9 % IV BOLUS (SEPSIS)
1000.0000 mL | Freq: Once | INTRAVENOUS | Status: AC
  Administered 2015-12-23: 1000 mL via INTRAVENOUS

## 2015-12-23 MED ORDER — LIDOCAINE HCL (CARDIAC) 20 MG/ML IV SOLN
INTRAVENOUS | Status: DC | PRN
  Administered 2015-12-23: 60 mg via INTRAVENOUS

## 2015-12-23 MED ORDER — ONDANSETRON HCL 4 MG/2ML IJ SOLN
INTRAMUSCULAR | Status: DC | PRN
  Administered 2015-12-23: 4 mg via INTRAVENOUS

## 2015-12-23 MED ORDER — OXYCODONE HCL 5 MG/5ML PO SOLN: 5.0000 mg | Freq: Once | ORAL | Status: DC | PRN

## 2015-12-23 MED ORDER — PHENAZOPYRIDINE HCL 200 MG PO TABS: 200.0000 mg | ORAL_TABLET | Freq: Three times a day (TID) | ORAL | 0 refills | Status: DC | PRN

## 2015-12-23 MED ORDER — FENTANYL CITRATE (PF) 100 MCG/2ML IJ SOLN: 25.0000 ug | INTRAMUSCULAR | Status: DC | PRN

## 2015-12-23 SURGICAL SUPPLY — 18 items
BAG DRAIN CYSTO-URO LG1000N (MISCELLANEOUS) ×2 IMPLANT
CATH URETL 5X70 OPEN END (CATHETERS) ×2 IMPLANT
GLOVE BIO SURGEON STRL SZ7.5 (GLOVE) ×2 IMPLANT
GOWN STRL REUS W/ TWL LRG LVL3 (GOWN DISPOSABLE) ×2 IMPLANT
GOWN STRL REUS W/TWL LRG LVL3 (GOWN DISPOSABLE) ×2
PACK CYSTO AR (MISCELLANEOUS) ×2 IMPLANT
PREP PVP WINGED SPONGE (MISCELLANEOUS) ×2 IMPLANT
SENSORWIRE 0.038 NOT ANGLED (WIRE) ×2
SET CYSTO W/LG BORE CLAMP LF (SET/KITS/TRAYS/PACK) ×2 IMPLANT
SOL .9 NS 3000ML IRR  AL (IV SOLUTION) ×1
SOL .9 NS 3000ML IRR UROMATIC (IV SOLUTION) ×1 IMPLANT
SOL PREP PVP 2OZ (MISCELLANEOUS) ×2
SOLUTION PREP PVP 2OZ (MISCELLANEOUS) ×1 IMPLANT
STENT URET 6FRX24 CONTOUR (STENTS) ×2 IMPLANT
STENT URET 6FRX26 CONTOUR (STENTS) IMPLANT
SURGILUBE 2OZ TUBE FLIPTOP (MISCELLANEOUS) ×2 IMPLANT
WATER STERILE IRR 1000ML POUR (IV SOLUTION) ×2 IMPLANT
WIRE SENSOR 0.038 NOT ANGLED (WIRE) ×1 IMPLANT

## 2015-12-23 NOTE — H&P (Signed)
I have been asked to see the patient by Dr. Nance Pear, MD, for evaluation and management of left distal ureteral stone and infection.  History of present illness: 75 year old female who presented to the emergency department early this morning with acute onset left-sided flank pain. The patient was also having associated voiding symptoms including dysuria, frequency, and urgency. She denied any gross hematuria. The patient was noted to have a urine analysis consistent with infection. She subsequently underwent a CT scan demonstrating a 4 mm left distal ureteral stone.  The patient has had some nausea and vomiting here in the emergency department. Her pain has been poorly controlled. She describes her pain as 10/10 in the left lower quadrant radiating up to the left flank. She has been afebrile with stable vital signs.  The patient has a history of recurrent urinary tract infections. She has had one kidney stone in the past requiring ureteroscopy and laser lithotripsy stone extraction.  Review of systems: A 12 point comprehensive review of systems was obtained and is negative unless otherwise stated in the history of present illness.  Patient Active Problem List   Diagnosis Date Noted  . Skin lesion of right leg 05/31/2014  . Visit for preventive health examination 02/16/2014  . Advanced directives, counseling/discussion 02/16/2014  . Cystitis 11/02/2013  . Cystocele 11/02/2013  . Hypertension   . Gastric ulcer   . Episodic mood disorder ([place])   . Osteoarthritis, multiple sites   . Postmenopausal disorder     No current facility-administered medications on file prior to encounter.    Current Outpatient Prescriptions on File Prior to Encounter  Medication Sig Dispense Refill  . calcium carbonate (OS-CAL) 600 MG TABS tablet Take 600 mg by mouth daily.    . Cholecalciferol (VITAMIN D) 400 UNITS capsule Take 800 Units by mouth daily.    . diazepam (VALIUM) 10 MG tablet TAKE 1/2 A  TABLET BY MOUTH TWICE DAILY AS NEEDED FOR ANXIETY AND SLEEP (Patient taking differently: Take 5 mg by mouth 2 (two) times daily as needed for anxiety or sleep. ) 30 tablet 0  . Omega-3 Fatty Acids (FISH OIL) 1000 MG CAPS Take 1,000 mg by mouth daily.     . sucralfate (CARAFATE) 1 g tablet Take 1 tablet (1 g total) by mouth 4 (four) times daily -  with meals and at bedtime. 120 tablet 11  . traMADol (ULTRAM) 50 MG tablet Take 1 tablet (50 mg total) by mouth 2 (two) times daily. 30 tablet 0  . vitamin C (ASCORBIC ACID) 500 MG tablet Take 500 mg by mouth daily.      Past Medical History:  Diagnosis Date  . Anxiety   . Depression   . Episodic mood disorder ([place])    mostly anxiety--depression in the past  . Gastric ulcer    History  . History of blood transfusion    At [facility] appro [facility] yrs ago  . History of kidney stones 1960  . Hypertension   . Osteoarthritis, hand    hand  . Postmenopausal disorder   . SVD (spontaneous vaginal delivery)    x 3    Past Surgical History:  Procedure Laterality Date  . CHOLECYSTECTOMY  2008  . COLONOSCOPY  04/2011  . CYSTOCELE REPAIR N/A 12/31/2013   Procedure: ANTERIOR REPAIR (CYSTOCELE);  Surgeon: Donnamae Jude, MD;  Location: [facility];  Service: Gynecology;  Laterality: N/A;  . DIAGNOSTIC LAPAROSCOPY    . kidney stone removed    .  MELANOMA EXCISION  2015   left posterior thigh  . TONSILLECTOMY  1944  . TUBAL LIGATION    . UPPER GI ENDOSCOPY    . WISDOM TOOTH EXTRACTION      Social History  Substance Use Topics  . Smoking status: Never Smoker  . Smokeless tobacco: Never Used  . Alcohol use No    Family History  Problem Relation Age of Onset  . Alzheimer's disease Mother   . Alcohol abuse Father   . COPD Brother   . Heart disease Neg Hx   . Cancer Neg Hx     PE: Vitals:   12/23/15 1500 12/23/15 1530 12/23/15 1600 12/23/15 1631  BP: (!) 104/46 117/60 (!) 109/57 120/66  Pulse: 91 90 83   Resp:      Temp:       TempSrc:      SpO2: 93% 95% 99%   Weight:      Height:       Patient appears to be in no acute distress  patient is alert and oriented x3 Atraumatic normocephalic head No cervical or supraclavicular lymphadenopathy appreciated No increased work of breathing, no audible wheezes/rhonchi Regular sinus rhythm/rate Abdomen is soft, nontender, nondistended, Severe left-sided CVA tenderness Lower extremities are symmetric without appreciable edema Grossly neurologically intact No identifiable skin lesions   Recent Labs  12/23/15 0746  WBC 8.4  HGB 15.0  HCT 42.7    Recent Labs  12/23/15 0746  NA 134*  K 3.5  CL 101  CO2 23  GLUCOSE 130*  BUN 16  CREATININE 1.15*  CALCIUM 9.4   No results for input(s): LABPT, INR in the last 72 hours. No results for input(s): LABURIN in the last 72 hours. Results for orders placed or performed during the hospital encounter of 10/31/14  Urine C&S (if patient has fever above 100.21F)     Status: None   Collection Time: 10/30/14 11:52 PM  Result Value Ref Range Status   Specimen Description URINE, CLEAN CATCH  Final   Special Requests NONE  Final   Culture   Final    >=100,000 COLONIES/mL ESCHERICHIA COLI WITH MIXED BACTERIAL ORGANISMS    Report Status 11/02/2014 FINAL  Final   Organism ID, Bacteria ESCHERICHIA COLI  Final      Susceptibility   Escherichia coli - MIC*    AMPICILLIN <=2 SENSITIVE Sensitive     CEFAZOLIN <=4 SENSITIVE Sensitive     CEFTRIAXONE <=1 SENSITIVE Sensitive     CIPROFLOXACIN <=0.25 SENSITIVE Sensitive     GENTAMICIN <=1 SENSITIVE Sensitive     IMIPENEM <=0.25 SENSITIVE Sensitive     NITROFURANTOIN <=16 SENSITIVE Sensitive     TRIMETH/SULFA <=20 SENSITIVE Sensitive     PIP/TAZO Value in next row Sensitive      SENSITIVE<=4    ERTAPENEM Value in next row Sensitive      SENSITIVE<=0.5    LEVOFLOXACIN Value in next row Sensitive      SENSITIVE<=0.12    * >=100,000 COLONIES/mL ESCHERICHIA COLI  Culture,  blood (routine x 2)     Status: None   Collection Time: 10/31/14  2:29 AM  Result Value Ref Range Status   Specimen Description 1 BLOOD  Final   Special Requests BOTTLES DRAWN AEROBIC AND ANAEROBIC 2CC  Final   Culture NO GROWTH 5 DAYS  Final   Report Status 11/05/2014 FINAL  Final  Culture, blood (routine x 2)     Status: None   Collection Time: 10/31/14  2:30 AM  Result Value Ref Range Status   Specimen Description 2 BLOOD  Final   Special Requests BOTTLES DRAWN AEROBIC AND ANAEROBIC 2CC  Final   Culture NO GROWTH 5 DAYS  Final   Report Status 11/05/2014 FINAL  Final    Imaging: I have independently reviewed the patient's CT scan demonstrating a large simple cyst in the anterior aspect of the left kidney. The patient has mild to moderate hydronephrosis with a 4-5 mm stone obstructing in the left distal ureter at the level of the UVJ. In addition, the patient has a nonobstructing stone in the left lower pole.  Imp: The patient has a left distal ureteral stone with obstruction, renal colic, poorly controlled pain and a urinary tract infection. She is currently stable but at risk for developing urosepsis.  Recommendations: I discussed the treatment options with the patient including admission and observation with antibiotics versus urgent stent placement for decompression of the left kidney, treatment of her stone, and a long course of antibiotics to treat her infection. She understands that the latter procedure does require a follow-up procedure to treat the kidney stone. I discussed the surgery with her in detail including the risks and the benefits thereof. Having heard all her options, the patient would like to proceed with the operation.   Louis Meckel W

## 2015-12-23 NOTE — Op Note (Signed)
Preoperative diagnosis:  1. Left obstructing stone with poorly controlled pain and evidence of a urinary tract infection   Postoperative diagnosis:  1. Same   Procedure:  1. Cystoscopy 2. left ureteral stent placement 3. left retrograde pyelography with interpretation   Surgeon: Ardis Hughs, MD  Anesthesia: General  Complications: None  Intraoperative findings:  left retrograde pyelography demonstrated a filling defect within the left ureter consistent with the patient's known calculus without other abnormalities.  EBL: Minimal  Specimens: None  Indication: Katie Bell is a 75 y.o. patient with left distal ureteral stone, mild to moderate hydronephrosis, and evidence of a urinary tract infection. After reviewing the management options for treatment, he elected to proceed with the above surgical procedure(s). We have discussed the potential benefits and risks of the procedure, side effects of the proposed treatment, the likelihood of the patient achieving the goals of the procedure, and any potential problems that might occur during the procedure or recuperation. Informed consent has been obtained.  Description of procedure:  The patient was taken to the operating room and general anesthesia was induced.  The patient was placed in the dorsal lithotomy position, prepped and draped in the usual sterile fashion, and preoperative antibiotics were administered. A preoperative time-out was performed.   Cystourethroscopy was performed.  The patient's urethra was examined and was normal. The bladder was then systematically examined in its entirety. There was no evidence for any bladder tumors, stones, or other mucosal pathology.    Attention then turned to the leftureteral orifice and a ureteral catheter was used to intubate the ureteral orifice.  Omnipaque contrast was injected through the ureteral catheter and a retrograde pyelogram was performed with findings as dictated  above.  A 0.38 sensor guidewire was then advanced up the left ureter into the renal pelvis under fluoroscopic guidance.  The wire was then backloaded through the cystoscope and a ureteral stent was advance over the wire using Seldinger technique.  The stent was positioned appropriately under fluoroscopic and cystoscopic guidance.  The wire was then removed with an adequate stent curl noted in the renal pelvis as well as in the bladder.  The bladder was then emptied and the procedure ended.  The patient appeared to tolerate the procedure well and without complications.  The patient was able to be awakened and transferred to the recovery unit in satisfactory condition.    Ardis Hughs, M.D.

## 2015-12-23 NOTE — Consult Note (Addendum)
I was consulted and by Dr. Archie Balboa in regards to this patient who has a 4 mm left distal ureteral stone and a UA concerning for infection. Her options were admission for observation versus urgent stent placement. The patient opted for stent. I have called the operating room and informed them of the procedure. Our plan is to get this done at 5:00, the end of the day. I will discuss the indications as well as the procedure in more detail once I formally meet the patient.

## 2015-12-23 NOTE — ED Notes (Signed)
Patient attempting to use the bed pan.

## 2015-12-23 NOTE — Anesthesia Preprocedure Evaluation (Signed)
Anesthesia Evaluation  Patient identified by MRN, date of birth, ID band Patient awake    Reviewed: Allergy & Precautions, H&P , NPO status , [name] Chart, lab work & pertinent test results  History of Anesthesia Complications Negative for: history of anesthetic complications  Airway Mallampati: III  TM Distance: <3 FB Neck ROM: full    Dental  (+) Poor Dentition, Chipped   Pulmonary neg pulmonary ROS, neg shortness of breath,    Pulmonary exam normal breath sounds clear to auscultation       Cardiovascular Exercise Tolerance: Good hypertension, (-) angina(-) Past MI and (-) DOE Normal cardiovascular exam Rhythm:regular Rate:Normal     Neuro/Psych PSYCHIATRIC DISORDERS Anxiety Depression negative neurological ROS     GI/Hepatic Neg liver ROS, PUD, neg GERD  ,  Endo/Other  negative endocrine ROS  Renal/GU      Musculoskeletal  (+) Arthritis ,   Abdominal   Peds  Hematology negative hematology ROS (+)   Anesthesia Other Findings Patient is NPO appropriate She reports no problems with fentanyl  Past Medical History: No date: Anxiety No date: Depression No date: Episodic mood disorder ([place])     Comment: mostly anxiety--depression in the past No date: Gastric ulcer     Comment: History No date: History of blood transfusion     Comment: At [facility] appro 15 yrs ago 1960: History of kidney stones No date: Hypertension No date: Osteoarthritis, hand     Comment: hand No date: Postmenopausal disorder No date: SVD (spontaneous vaginal delivery)     Comment: x 3  Past Surgical History: 2008: CHOLECYSTECTOMY 04/2011: COLONOSCOPY 12/31/2013: CYSTOCELE REPAIR N/A     Comment: Procedure: ANTERIOR REPAIR (CYSTOCELE);                Surgeon: Donnamae Jude, MD;  Location: [facility];               Service: Gynecology;  Laterality: N/A; No date: DIAGNOSTIC LAPAROSCOPY No date: kidney stone  removed 2015: MELANOMA EXCISION     Comment: left posterior thigh 1944: TONSILLECTOMY No date: TUBAL LIGATION No date: UPPER GI ENDOSCOPY No date: WISDOM TOOTH EXTRACTION  BMI    Body Mass Index:  24.75 kg/m      Reproductive/Obstetrics negative OB ROS                             Anesthesia Physical Anesthesia Plan  ASA: III  Anesthesia Plan: General LMA   Post-op Pain Management:    Induction:   Airway Management Planned:   Additional Equipment:   Intra-op Plan:   Post-operative Plan:   Informed Consent: I have reviewed the patients History and Physical, chart, labs and discussed the procedure including the risks, benefits and alternatives for the proposed anesthesia with the patient or authorized representative who has indicated his/her understanding and acceptance.     Plan Discussed with: Anesthesiologist, CRNA and Surgeon  Anesthesia Plan Comments:         Anesthesia Quick Evaluation

## 2015-12-23 NOTE — Transfer of Care (Signed)
Immediate Anesthesia Transfer of Care Note  Patient: Katie Bell  Procedure(s) Performed: Procedure(s): CYSTOSCOPY WITH STENT PLACEMENT (Left)  Patient Location: PACU  Anesthesia Type:General  Level of Consciousness: awake  Airway & Oxygen Therapy: Patient connected to nasal cannula oxygen  Post-op Assessment: Post -op Vital signs reviewed and stable  Post vital signs: stable  Last Vitals:  Vitals:   12/23/15 1631 12/23/15 1835  BP: 120/66 (!) 132/55  Pulse:  96  Resp:  17  Temp:  36.7 C    Last Pain:  Vitals:   12/23/15 1205  TempSrc:   PainSc: 7          Complications: No apparent anesthesia complications

## 2015-12-23 NOTE — Anesthesia Procedure Notes (Signed)
Procedure Name: Intubation Date/Time: 12/23/2015 6:05 PM Performed by: Aline Brochure Pre-anesthesia Checklist: Patient identified, Emergency Drugs available, Suction available and Patient being monitored Patient Re-evaluated:Patient Re-evaluated prior to inductionOxygen Delivery Method: Circle system utilized Preoxygenation: Pre-oxygenation with 100% oxygen Intubation Type: Rapid sequence, IV induction and Cricoid Pressure applied Laryngoscope Size: Mac and 3 Grade View: Grade II Tube size: 7.0 mm Number of attempts: 1 Airway Equipment and Method: Stylet Placement Confirmation: positive ETCO2 and breath sounds checked- equal and bilateral Secured at: 21 cm Tube secured with: Tape Dental Injury: Injury to lip and Teeth and Oropharynx as per pre-operative assessment

## 2015-12-23 NOTE — ED Notes (Signed)
Patient O2 sat dropped to 89% on room air after receiving pain medication.Patient put on 2 liters of O2 with improvement in O2 sats to 96%. Dr. Archie Balboa informed.

## 2015-12-23 NOTE — ED Provider Notes (Signed)
[facility] Emergency Department Provider Note    ____________________________________________   I have reviewed the triage vital signs and the nursing notes.   HISTORY  Chief Complaint Abdominal Pain   History limited by: Not Limited   HPI Katie Bell is a 75 y.o. female Katie Bell to the emergency department today because of concerns for abdominal pain. It is located in the left lower quadrant. It started roughly 4 hours ago. Has been severe. It has been constant. She describes it as a sharp pain like somebody is stabbing her with a knife. She does have a history of ulcers and states this pain feels somewhat similar. She has had some associated nausea. No recent change in her bowel movements. She denies any fevers.   Past Medical History:  Diagnosis Date  . Anxiety   . Depression   . Episodic mood disorder ([place])    mostly anxiety--depression in the past  . Gastric ulcer    History  . History of blood transfusion    At [facility] appro 15 yrs ago  . History of kidney stones 1960  . Hypertension   . Osteoarthritis, hand    hand  . Postmenopausal disorder   . SVD (spontaneous vaginal delivery)    x 3    Patient Active Problem List   Diagnosis Date Noted  . Skin lesion of right leg 05/31/2014  . Visit for preventive health examination 02/16/2014  . Advanced directives, counseling/discussion 02/16/2014  . Cystitis 11/02/2013  . Cystocele 11/02/2013  . Hypertension   . Gastric ulcer   . Episodic mood disorder ([place])   . Osteoarthritis, multiple sites   . Postmenopausal disorder     Past Surgical History:  Procedure Laterality Date  . CHOLECYSTECTOMY  2008  . COLONOSCOPY  04/2011  . CYSTOCELE REPAIR N/A 12/31/2013   Procedure: ANTERIOR REPAIR (CYSTOCELE);  Surgeon: Donnamae Jude, MD;  Location: [facility];  Service: Gynecology;  Laterality: N/A;  . DIAGNOSTIC LAPAROSCOPY    . kidney stone removed    . MELANOMA EXCISION  2015   left posterior thigh  . TONSILLECTOMY  1944  . TUBAL LIGATION    . UPPER GI ENDOSCOPY    . WISDOM TOOTH EXTRACTION      Prior to Admission medications   Medication Sig Start Date End Date Taking? Authorizing Provider  calcium carbonate (OS-CAL) 600 MG TABS tablet Take 600 mg by mouth daily.    Historical Provider, MD  Cholecalciferol (VITAMIN D) 400 UNITS capsule Take 800 Units by mouth daily.    Historical Provider, MD  diazepam (VALIUM) 10 MG tablet TAKE 1/2 A TABLET BY MOUTH TWICE DAILY AS NEEDED FOR ANXIETY AND SLEEP 12/06/15   Venia Carbon, MD  losartan-hydrochlorothiazide [facility]) 50-12.5 MG tablet TAKE 1 TABLET BY MOUTH ONCE A DAY 10/05/15   Venia Carbon, MD  Omega-3 Fatty Acids (FISH OIL) 1000 MG CAPS Take 1 capsule by mouth daily.    Historical Provider, MD  sucralfate (CARAFATE) 1 g tablet Take 1 tablet (1 g total) by mouth 4 (four) times daily -  with meals and at bedtime. 08/26/15   Venia Carbon, MD  traMADol (ULTRAM) 50 MG tablet Take 1 tablet (50 mg total) by mouth 2 (two) times daily. 11/17/15   Ria Bush, MD  vitamin C (ASCORBIC ACID) 500 MG tablet Take 500 mg by mouth daily.    Historical Provider, MD    Allergies Morphine and related  Family History  Problem  Relation Age of Onset  . Alzheimer's disease Mother   . Alcohol abuse Father   . COPD Brother   . Heart disease Neg Hx   . Cancer Neg Hx     Social History Social History  Substance Use Topics  . Smoking status: Never Smoker  . Smokeless tobacco: Never Used  . Alcohol use No    Review of Systems  Constitutional: Negative for fever. Cardiovascular: Negative for chest pain. Respiratory: Negative for shortness of breath. Gastrointestinal: Positive for abdominal pain Neurological: Negative for headaches, focal weakness or numbness.  10-point ROS otherwise negative.  ____________________________________________   PHYSICAL EXAM:  VITAL SIGNS: ED Triage Vitals  Enc Vitals Group      BP 12/23/15 0729 (!) 113/53     Pulse Rate 12/23/15 0729 72     Resp 12/23/15 0729 18     Temp 12/23/15 0729 98.1 F (36.7 C)     Temp Source 12/23/15 0729 Oral     SpO2 12/23/15 0729 96 %     Weight 12/23/15 0729 131 lb (59.4 kg)     Height 12/23/15 0729 5\' 1"  (1.549 m)     Head Circumference --      Peak Flow --      Pain Score 12/23/15 0730 10   Constitutional: Alert and oriented. Appears uncomfortable Eyes: Conjunctivae are normal. Normal extraocular movements. ENT   Head: Normocephalic and atraumatic.   Nose: No congestion/rhinnorhea.   Mouth/Throat: Mucous membranes are moist.   Neck: No stridor. Hematological/Lymphatic/Immunilogical: No cervical lymphadenopathy. Cardiovascular: Normal rate, regular rhythm.  No murmurs, rubs, or gallops. Respiratory: Normal respiratory effort without tachypnea nor retractions. Breath sounds are clear and equal bilaterally. No wheezes/rales/rhonchi. Gastrointestinal: Soft and tender to palpation in the left lower quadrant. No rebound. No guarding. No CVA tenderness. Genitourinary: Deferred Musculoskeletal: Normal range of motion in all extremities. No lower extremity edema. Neurologic:  Normal speech and language. No gross focal neurologic deficits are appreciated.  Skin:  Skin is warm, dry and intact. No rash noted. Psychiatric: Mood and affect are normal. Speech and behavior are normal. Patient exhibits appropriate insight and judgment.  ____________________________________________    LABS (pertinent positives/negatives)  Labs Reviewed  COMPREHENSIVE METABOLIC PANEL - Abnormal; Notable for the following:       Result Value   Sodium 134 (*)    Glucose, Bld 130 (*)    Creatinine, Ser 1.15 (*)    Total Bilirubin 1.3 (*)    GFR calc non Af Amer 45 (*)    GFR calc Af Amer 52 (*)    All other components within normal limits  URINALYSIS COMPLETEWITH MICROSCOPIC ([facility] ONLY) - Abnormal; Notable for the following:    Color,  Urine YELLOW (*)    APPearance HAZY (*)    Hgb urine dipstick 3+ (*)    Protein, ur 30 (*)    Nitrite POSITIVE (*)    Leukocytes, UA 3+ (*)    Bacteria, UA MANY (*)    Squamous Epithelial / LPF 6-30 (*)    All other components within normal limits  LIPASE, BLOOD  CBC     ____________________________________________   EKG  None  ____________________________________________    RADIOLOGY  CT abd/pel IMPRESSION:  1. 4 mm obstructing distal left ureteral calculus with mild  hydronephrosis. Moderate perinephric fluid compatible with caliceal  rupture.  2. 6 mm left lower pole renal calculus. 7 cm left renal cyst.  3. Mild intrahepatic and extrahepatic biliary dilatation and  borderline pancreatic  ductal dilatation in the setting of prior  cholecystectomy. Recommend laboratory correlation.  4. Aortic atherosclerosis.  5. 3.5 cm soft tissue focus in the left adnexa which could reflect  an enlarged ovary/small neoplasm or possibly exophytic uterine  fibroid. Nonurgent pelvic ultrasound is suggested.     ____________________________________________   PROCEDURES  Procedures  ____________________________________________   INITIAL IMPRESSION / ASSESSMENT AND PLAN / ED COURSE  Pertinent labs & imaging results that were available during my care of the patient were reviewed by me and considered in my medical decision making (see chart for details).  Patient presents to the emergency department today because of concerns for abdominal pain. Will check blood work and CT scan.  Clinical Course   CT scan is consistent with kidney stone. [name] urine is fortunately consistent with an infection. Will give patient a dose of IV antibiotics here. Discussed with Dr. Louis Meckel with urology. He will place a stent today the OR. I discussed this with the patient who is willing. ____________________________________________   FINAL CLINICAL IMPRESSION(S) / ED DIAGNOSES  Final  diagnoses:  UTI (lower urinary tract infection)  Kidney stone     Note: This dictation was prepared with Dragon dictation. Any transcriptional errors that result from this process are unintentional    Nance Pear, MD 12/23/15 1540

## 2015-12-23 NOTE — Anesthesia Postprocedure Evaluation (Signed)
Anesthesia Post Note  Patient: Katie Bell  Procedure(s) Performed: Procedure(s) (LRB): CYSTOSCOPY WITH STENT PLACEMENT (Left)  Patient location during evaluation: PACU Anesthesia Type: General Level of consciousness: awake and alert Pain management: pain level controlled Vital Signs Assessment: post-procedure vital signs reviewed and stable Respiratory status: spontaneous breathing, nonlabored ventilation, respiratory function stable and patient connected to nasal cannula oxygen Cardiovascular status: blood pressure returned to baseline and stable Postop Assessment: no signs of nausea or vomiting Anesthetic complications: no    Last Vitals:  Vitals:   12/23/15 1935 12/23/15 2005  BP: (!) 112/52 106/85  Pulse: 77 82  Resp: 14   Temp: 37.3 C     Last Pain:  Vitals:   12/23/15 1935  TempSrc:   PainSc: [place]

## 2015-12-23 NOTE — ED Notes (Signed)
Orderly here to transport patient to OR

## 2015-12-23 NOTE — Discharge Instructions (Signed)
DISCHARGE INSTRUCTIONS FOR KIDNEY STONE/URETERAL STENT   MEDICATIONS:  1. Resume all your other meds from home - except do not take any extra narcotic pain meds that you may have at home.  2. Pyridium is to help with the burning/stinging when you urinate. 3. Tramadol is for moderate/severe pain, otherwise taking upto 1000 mg every 6 hours of plainTylenol will help treat your pain.   4. Take Bactrim DS twice daily for 7 days to treat UTI  ACTIVITY:  1. No strenuous activity x 1week  2. No driving while on narcotic pain medications  3. Drink plenty of water  4. Continue to walk at home - you can still get blood clots when you are at home, so keep active, but don't over do it.  5. May return to work/school tomorrow or when you feel ready   BATHING:  1. You can shower and we recommend daily showers    SIGNS/SYMPTOMS TO CALL:  Please call us if you have a fever greater than 101.5, uncontrolled nausea/vomiting, uncontrolled pain, dizziness, unable to urinate, bloody urine, chest pain, shortness of breath, leg swelling, leg pain, redness around wound, drainage from wound, or any other concerns or questions.   You can reach [place] at [phone].   FOLLOW-UP:  1. You have an appointment in1 week.

## 2015-12-23 NOTE — ED Notes (Signed)
Patient c/o LLQ abdominal pain and N/V that started this morning around 3 am. Patient states that she has had chills and "the shakes". Patient denies diarrhea and fever.

## 2015-12-23 NOTE — ED Notes (Signed)
IV insertion attempted x 2 unsuccessfully by this RN

## 2015-12-23 NOTE — ED Notes (Signed)
IV attempt x 1 unsuccessful by this RN

## 2015-12-23 NOTE — ED Triage Notes (Signed)
Pt c/o LLQ pain with N/V since 3am this morning.Marland Kitchen

## 2015-12-23 NOTE — ED Notes (Signed)
Myriam Jacobson, RN attempted IV acces x 3 unsuccessfully.

## 2015-12-27 ENCOUNTER — Telehealth: Payer: Self-pay

## 2015-12-27 ENCOUNTER — Encounter: Payer: Self-pay | Admitting: Urology

## 2015-12-27 NOTE — Telephone Encounter (Signed)
-----   Message from Ardis Hughs, MD sent at 12/23/2015  6:01 PM EDT ----- Regarding: f/u in 1 week Needs f/u for scheduling ureterscopy.

## 2016-01-02 ENCOUNTER — Other Ambulatory Visit: Payer: Self-pay | Admitting: Radiology

## 2016-01-02 ENCOUNTER — Ambulatory Visit (INDEPENDENT_AMBULATORY_CARE_PROVIDER_SITE_OTHER): Payer: Commercial Managed Care - HMO | Admitting: Urology

## 2016-01-02 VITALS — BP 115/70 | HR 78 | Ht 61.0 in | Wt 119.1 lb

## 2016-01-02 DIAGNOSIS — N201 Calculus of ureter: Secondary | ICD-10-CM

## 2016-01-02 DIAGNOSIS — N200 Calculus of kidney: Secondary | ICD-10-CM | POA: Diagnosis not present

## 2016-01-02 NOTE — Telephone Encounter (Signed)
Notified patient of surgery scheduled with Dr. Erlene Quan on 01/16/2016, preadmission testing appointment on 01/05/2016 at 9:00 and to call Friday prior to surgery for arrival time to same day surgery, pt voices understanding.

## 2016-01-02 NOTE — Progress Notes (Signed)
01/02/2016 8:40 AM   Katie Bell [birth year] [id number]  Referring provider: Venia Carbon, MD [name], [place] [address]  Chief Complaint  Patient presents with  . Nephrolithiasis    HPI: 75 yo Female who underwent urgent cystoscopy with left ureteral stent for a 4 mm left UVJ stone 12/23/2015. She also has a 6 mm left lower pole stone. No recent urine cx.   She presents today to discuss treatment options. She continues daily Bactrim. Again there was no urine culture from the hospital. She's had no fever or chills. She has some mild stent pain.  PMH: Past Medical History:  Diagnosis Date  . Anxiety   . Depression   . Episodic mood disorder ([place])    mostly anxiety--depression in the past  . Gastric ulcer    History  . History of blood transfusion    At [facility] appro 15 yrs ago  . History of kidney stones 1960  . Hypertension   . Osteoarthritis, hand    hand  . Postmenopausal disorder   . SVD (spontaneous vaginal delivery)    x 3    Surgical History: Past Surgical History:  Procedure Laterality Date  . CHOLECYSTECTOMY  2008  . COLONOSCOPY  04/2011  . CYSTOCELE REPAIR N/A 12/31/2013   Procedure: ANTERIOR REPAIR (CYSTOCELE);  Surgeon: Donnamae Jude, MD;  Location: [facility];  Service: Gynecology;  Laterality: N/A;  . CYSTOSCOPY WITH STENT PLACEMENT Left 12/23/2015   Procedure: CYSTOSCOPY WITH STENT PLACEMENT;  Surgeon: Ardis Hughs, MD;  Location: [facility];  Service: Urology;  Laterality: Left;  . DIAGNOSTIC LAPAROSCOPY    . kidney stone removed    . MELANOMA EXCISION  2015   left posterior thigh  . TONSILLECTOMY  1944  . TUBAL LIGATION    . UPPER GI ENDOSCOPY    . WISDOM TOOTH EXTRACTION      Home Medications:    Medication List       Accurate as of 01/02/16  8:40 AM. Always use your most recent med list.          calcium carbonate 600 MG Tabs tablet Commonly known as:  OS-CAL Take 600 mg by mouth daily.     diazepam 10 MG tablet Commonly known as:  VALIUM TAKE 1/2 A TABLET BY MOUTH TWICE DAILY AS NEEDED FOR ANXIETY AND SLEEP   Fish Oil 1000 MG Caps Take 1,000 mg by mouth daily.   losartan-hydrochlorothiazide 50-12.5 MG tablet Commonly known as:  HYZAAR Take 1 tablet by mouth daily.   phenazopyridine 200 MG tablet Commonly known as:  PYRIDIUM Take 1 tablet (200 mg total) by mouth 3 (three) times daily as needed for pain.   sucralfate 1 g tablet Commonly known as:  CARAFATE Take 1 tablet (1 g total) by mouth 4 (four) times daily -  with meals and at bedtime.   sulfamethoxazole-trimethoprim 800-160 MG tablet Commonly known as:  BACTRIM DS,SEPTRA DS Take 1 tablet by mouth 2 (two) times daily.   traMADol 50 MG tablet Commonly known as:  ULTRAM Take 1 tablet (50 mg total) by mouth 2 (two) times daily.   vitamin C 500 MG tablet Commonly known as:  ASCORBIC ACID Take 500 mg by mouth daily.   Vitamin D 400 units capsule Take 800 Units by mouth daily.       Allergies:  Allergies  Allergen Reactions  . Morphine And Related Anxiety    Family History: Family History  Problem Relation Age of Onset  . Alzheimer's disease Mother   . Alcohol abuse Father   . COPD Brother   . Heart disease Neg Hx   . Cancer Neg Hx     Social History:  reports that she has never smoked. She has never used smokeless tobacco. She reports that she does not drink alcohol or use drugs.  ROS:                                        Physical Exam: There were no vitals taken for this visit.  Constitutional:  Alert and oriented, No acute distress. HEENT: [place] AT, moist mucus membranes.  Trachea midline, no masses. Cardiovascular: No clubbing, cyanosis, or edema. Respiratory: Normal respiratory effort, no increased work of breathing. Skin: No rashes, bruises or suspicious lesions. Lymph: No cervical or inguinal adenopathy. Neurologic: Grossly intact, no focal deficits, moving  all 4 extremities. Psychiatric: Normal mood and affect.  Laboratory Data: Lab Results  Component Value Date   WBC 8.4 12/23/2015   HGB 15.0 12/23/2015   HCT 42.7 12/23/2015   MCV 92.4 12/23/2015   PLT 150 12/23/2015    Lab Results  Component Value Date   CREATININE 1.15 (H) 12/23/2015    No results found for: PSA  No results found for: TESTOSTERONE  No results found for: HGBA1C  Urinalysis    Component Value Date/Time   COLORURINE YELLOW (A) 12/23/2015 0934   APPEARANCEUR HAZY (A) 12/23/2015 0934   LABSPEC 1.008 12/23/2015 [place] 6.0 12/23/2015 [place] 12/23/2015 0934   HGBUR 3+ (A) 12/23/2015 0934   BILIRUBINUR NEGATIVE 12/23/2015 0934   BILIRUBINUR negative 08/26/2015 1130   KETONESUR NEGATIVE 12/23/2015 0934   PROTEINUR 30 (A) 12/23/2015 0934   UROBILINOGEN 4.0 08/26/2015 1130   NITRITE POSITIVE (A) 12/23/2015 0934   LEUKOCYTESUR 3+ (A) 12/23/2015 0934    Pertinent Imaging: I reviewed CT images.   Assessment & Plan:    1. Nephrolithiasis, Left ureteral stone: I drew patient a picture the anatomy and CT findings. We discussed the nature risk and benefits of simply removing the stent, ureteroscopy or shockwave lithotripsy. All questions answered. She elected to proceed with ureteroscopy with laser lithotripsy which will afford [place] the opportunity to go up and get the left lower pole stone. We did discuss the chance of a staged procedure as well as a postop new ureteral stent for a short time. I did inform her one of my colleagues would likely be doing her procedure. She continues Bactrim DS today and has a few more days.   Festus Aloe, [facility] [address], [place], [place] [address] [phone]

## 2016-01-02 NOTE — Telephone Encounter (Signed)
LMOM. Need to discuss surgery information. 

## 2016-01-05 ENCOUNTER — Encounter
Admission: RE | Admit: 2016-01-05 | Discharge: 2016-01-05 | Disposition: A | Discharge status: Home / Self Care | Payer: Commercial Managed Care - HMO | Source: Ambulatory Visit | Attending: Urology | Admitting: Urology

## 2016-01-05 ENCOUNTER — Other Ambulatory Visit: Payer: Self-pay | Admitting: Radiology

## 2016-01-05 DIAGNOSIS — Z01810 Encounter for preprocedural cardiovascular examination: Secondary | ICD-10-CM | POA: Diagnosis not present

## 2016-01-05 DIAGNOSIS — I10 Essential (primary) hypertension: Secondary | ICD-10-CM | POA: Diagnosis not present

## 2016-01-05 HISTORY — DX: Malignant (primary) neoplasm, unspecified: C80.1

## 2016-01-05 NOTE — Pre-Procedure Instructions (Signed)
Compared today's EKG with one taken on 12/22/13 with Ginger Carne RN, ok to proceed.-

## 2016-01-05 NOTE — Patient Instructions (Signed)
  Your procedure is scheduled on: Monday Sept 25, 2017. Report to Same Day Surgery. To find out your arrival time please call [phone] between 1PM - 3PM on Friday Sept. 22, 2107  Remember: Instructions that are not followed completely may result in serious medical risk, up to and including death, or upon the discretion of your surgeon and anesthesiologist your surgery may need to be rescheduled.    _x___ 1. Do not eat food or drink liquids after midnight. No gum chewing or  hard candies.     ____ 2. No Alcohol for 24 hours before or after surgery.   ____ 3. Bring all medications with you on the day of surgery if instructed.    __x__ 4. Notify your doctor if there is any change in your medical condition     (cold, fever, infections).    _____ 5. No smoking 24 hours prior to surgery.     Do not wear jewelry, make-up, hairpins, clips or nail polish.  Do not wear lotions, powders, or perfumes.   Do not shave 48 hours prior to surgery. Men may shave face and neck.  Do not bring valuables to the hospital.    [facility] is not responsible for any belongings or valuables.               Contacts, dentures or bridgework may not be worn into surgery.  Leave your suitcase in the car. After surgery it may be brought to your room.  For patients admitted to the hospital, discharge time is determined by your treatment team.   Patients discharged the day of surgery will not be allowed to drive home.    Please read over the following fact sheets that you were given:   [facility] Preparing for Surgery  ____ Take these medicines the morning of surgery with A SIP OF WATER: None    ____ Fleet Enema (as directed)   ____ Use CHG Soap as directed on instruction sheet  ____ Use inhalers on the day of surgery and bring to hospital day of surgery  ____ Stop metformin 2 days prior to surgery    ____ Take 1/2 of usual insulin dose the night before surgery and none on the morning of surgery.    ____ Stop Coumadin/Plavix/aspirin on does not apply.  ____ Stop Anti-inflammatories such as Advil, Aleve, Ibuprofen, Motrin, Naproxen,  Naprosyn, Goodies powders or aspirin products. OK to take Tylenol.   _x___ Stop supplements fish oil, and vitamin C until after surgery.    ____ Bring C-Pap to the hospital.

## 2016-01-06 LAB — URINE CULTURE

## 2016-01-13 NOTE — Pre-Procedure Instructions (Signed)
Urine culture sent to Dr. Erlene Quan for review.

## 2016-01-16 ENCOUNTER — Other Ambulatory Visit: Payer: Self-pay

## 2016-01-16 ENCOUNTER — Encounter
Admission: RE | Disposition: A | Discharge status: Home / Self Care | Payer: Self-pay | Source: Ambulatory Visit | Attending: Urology

## 2016-01-16 ENCOUNTER — Ambulatory Visit: Payer: Commercial Managed Care - HMO | Admitting: Anesthesiology

## 2016-01-16 ENCOUNTER — Encounter: Payer: Self-pay | Admitting: *Deleted

## 2016-01-16 ENCOUNTER — Ambulatory Visit
Admission: RE | Admit: 2016-01-16 | Discharge: 2016-01-16 | Disposition: A | Discharge status: Home / Self Care | Payer: Commercial Managed Care - HMO | Source: Ambulatory Visit | Attending: Urology | Admitting: Urology

## 2016-01-16 DIAGNOSIS — F329 Major depressive disorder, single episode, unspecified: Secondary | ICD-10-CM | POA: Insufficient documentation

## 2016-01-16 DIAGNOSIS — Z87442 Personal history of urinary calculi: Secondary | ICD-10-CM | POA: Diagnosis not present

## 2016-01-16 DIAGNOSIS — N201 Calculus of ureter: Secondary | ICD-10-CM | POA: Diagnosis not present

## 2016-01-16 DIAGNOSIS — Z9049 Acquired absence of other specified parts of digestive tract: Secondary | ICD-10-CM | POA: Diagnosis not present

## 2016-01-16 DIAGNOSIS — Z85820 Personal history of malignant melanoma of skin: Secondary | ICD-10-CM | POA: Diagnosis not present

## 2016-01-16 DIAGNOSIS — F419 Anxiety disorder, unspecified: Secondary | ICD-10-CM | POA: Insufficient documentation

## 2016-01-16 DIAGNOSIS — I10 Essential (primary) hypertension: Secondary | ICD-10-CM | POA: Insufficient documentation

## 2016-01-16 DIAGNOSIS — N200 Calculus of kidney: Secondary | ICD-10-CM | POA: Diagnosis not present

## 2016-01-16 DIAGNOSIS — N202 Calculus of kidney with calculus of ureter: Secondary | ICD-10-CM | POA: Diagnosis not present

## 2016-01-16 DIAGNOSIS — Z825 Family history of asthma and other chronic lower respiratory diseases: Secondary | ICD-10-CM | POA: Insufficient documentation

## 2016-01-16 DIAGNOSIS — Z8711 Personal history of peptic ulcer disease: Secondary | ICD-10-CM | POA: Diagnosis not present

## 2016-01-16 DIAGNOSIS — Z820 Family history of epilepsy and other diseases of the nervous system: Secondary | ICD-10-CM | POA: Insufficient documentation

## 2016-01-16 DIAGNOSIS — Z811 Family history of alcohol abuse and dependence: Secondary | ICD-10-CM | POA: Diagnosis not present

## 2016-01-16 DIAGNOSIS — M19049 Primary osteoarthritis, unspecified hand: Secondary | ICD-10-CM | POA: Diagnosis not present

## 2016-01-16 DIAGNOSIS — Z885 Allergy status to narcotic agent status: Secondary | ICD-10-CM | POA: Insufficient documentation

## 2016-01-16 DIAGNOSIS — Z79899 Other long term (current) drug therapy: Secondary | ICD-10-CM | POA: Insufficient documentation

## 2016-01-16 DIAGNOSIS — F418 Other specified anxiety disorders: Secondary | ICD-10-CM | POA: Diagnosis not present

## 2016-01-16 HISTORY — PX: CYSTOSCOPY W/ URETERAL STENT PLACEMENT: SHX1429

## 2016-01-16 HISTORY — PX: URETEROSCOPY WITH HOLMIUM LASER LITHOTRIPSY: SHX6645

## 2016-01-16 SURGERY — URETEROSCOPY, WITH LITHOTRIPSY USING HOLMIUM LASER
Anesthesia: General | Laterality: Left

## 2016-01-16 MED ORDER — FAMOTIDINE 20 MG PO TABS
ORAL_TABLET | ORAL | Status: AC
  Administered 2016-01-16: 20 mg
  Filled 2016-01-16: qty 1

## 2016-01-16 MED ORDER — ONDANSETRON HCL 4 MG/2ML IJ SOLN
INTRAMUSCULAR | Status: DC | PRN
  Administered 2016-01-16: 4 mg via INTRAVENOUS

## 2016-01-16 MED ORDER — FAMOTIDINE 20 MG PO TABS: 20.0000 mg | ORAL_TABLET | Freq: Once | ORAL | Status: DC

## 2016-01-16 MED ORDER — HYDROCODONE-ACETAMINOPHEN 5-325 MG PO TABS: 1.0000 | ORAL_TABLET | Freq: Four times a day (QID) | ORAL | 0 refills | Status: DC | PRN

## 2016-01-16 MED ORDER — NEOSTIGMINE METHYLSULFATE 10 MG/10ML IV SOLN
INTRAVENOUS | Status: DC | PRN
  Administered 2016-01-16: 5 mg via INTRAVENOUS

## 2016-01-16 MED ORDER — IOTHALAMATE MEGLUMINE 43 % IV SOLN
INTRAVENOUS | Status: DC | PRN
  Administered 2016-01-16: 10 mL

## 2016-01-16 MED ORDER — CEFAZOLIN SODIUM-DEXTROSE 2-4 GM/100ML-% IV SOLN
INTRAVENOUS | Status: AC
  Administered 2016-01-16: 2000 mg
  Filled 2016-01-16: qty 100

## 2016-01-16 MED ORDER — SODIUM CHLORIDE 0.9 % IR SOLN
Status: DC | PRN
  Administered 2016-01-16: 900 mL

## 2016-01-16 MED ORDER — GLYCOPYRROLATE 0.2 MG/ML IJ SOLN
INTRAMUSCULAR | Status: DC | PRN
  Administered 2016-01-16: 0.2 mg via INTRAVENOUS
  Administered 2016-01-16: .8 mg via INTRAVENOUS

## 2016-01-16 MED ORDER — SUCCINYLCHOLINE CHLORIDE 20 MG/ML IJ SOLN
INTRAMUSCULAR | Status: DC | PRN
Start: 2016-01-16 — End: 2016-01-16
  Administered 2016-01-16: 65 mg via INTRAVENOUS

## 2016-01-16 MED ORDER — PHENYLEPHRINE HCL 10 MG/ML IJ SOLN
INTRAMUSCULAR | Status: DC | PRN
  Administered 2016-01-16: 100 ug via INTRAVENOUS

## 2016-01-16 MED ORDER — FENTANYL CITRATE (PF) 100 MCG/2ML IJ SOLN
INTRAMUSCULAR | Status: DC | PRN
  Administered 2016-01-16 (×2): 25 ug via INTRAVENOUS
  Administered 2016-01-16: 50 ug via INTRAVENOUS

## 2016-01-16 MED ORDER — ONDANSETRON HCL 4 MG/2ML IJ SOLN
4.0000 mg | Freq: Once | INTRAMUSCULAR | Status: AC | PRN
  Administered 2016-01-16: 4 mg via INTRAVENOUS

## 2016-01-16 MED ORDER — LACTATED RINGERS IV SOLN
INTRAVENOUS | Status: DC
  Administered 2016-01-16: 13:00:00 via INTRAVENOUS

## 2016-01-16 MED ORDER — FENTANYL CITRATE (PF) 100 MCG/2ML IJ SOLN
INTRAMUSCULAR | Status: AC
  Filled 2016-01-16: qty 2

## 2016-01-16 MED ORDER — OXYBUTYNIN CHLORIDE 5 MG PO TABS: 5.0000 mg | ORAL_TABLET | Freq: Three times a day (TID) | ORAL | 0 refills | Status: DC | PRN

## 2016-01-16 MED ORDER — ROCURONIUM BROMIDE 100 MG/10ML IV SOLN
INTRAVENOUS | Status: DC | PRN
  Administered 2016-01-16: 20 mg via INTRAVENOUS

## 2016-01-16 MED ORDER — PROPOFOL 10 MG/ML IV BOLUS
INTRAVENOUS | Status: DC | PRN
  Administered 2016-01-16: 100 mg via INTRAVENOUS

## 2016-01-16 MED ORDER — TAMSULOSIN HCL 0.4 MG PO CAPS: 0.4000 mg | ORAL_CAPSULE | Freq: Every day | ORAL | 0 refills | Status: DC

## 2016-01-16 MED ORDER — DOCUSATE SODIUM 100 MG PO CAPS: 100.0000 mg | ORAL_CAPSULE | Freq: Two times a day (BID) | ORAL | 0 refills | Status: DC

## 2016-01-16 MED ORDER — ONDANSETRON HCL 4 MG/2ML IJ SOLN
INTRAMUSCULAR | Status: AC
  Filled 2016-01-16: qty 2

## 2016-01-16 MED ORDER — FENTANYL CITRATE (PF) 100 MCG/2ML IJ SOLN
25.0000 ug | INTRAMUSCULAR | Status: DC | PRN
  Administered 2016-01-16: 25 ug via INTRAVENOUS

## 2016-01-16 MED ORDER — LIDOCAINE HCL (CARDIAC) 20 MG/ML IV SOLN
INTRAVENOUS | Status: DC | PRN
  Administered 2016-01-16: 40 mg via INTRAVENOUS

## 2016-01-16 MED ORDER — CEFAZOLIN IN D5W 1 GM/50ML IV SOLN: 1.0000 g | Freq: Once | INTRAVENOUS | Status: DC

## 2016-01-16 SURGICAL SUPPLY — 36 items
ADAPTER SCOPE UROLOK II (MISCELLANEOUS) IMPLANT
ADHESIVE MASTISOL STRL (MISCELLANEOUS) ×2 IMPLANT
BAG DRAIN CYSTO-URO LG1000N (MISCELLANEOUS) ×2 IMPLANT
BASKET ZERO TIP 1.9FR (BASKET) ×2 IMPLANT
CATH FOL 2WAY LX 16X5 (CATHETERS) IMPLANT
CATH URETL 5X70 OPEN END (CATHETERS) ×2 IMPLANT
CNTNR SPEC 2.5X3XGRAD LEK (MISCELLANEOUS) ×1
CONRAY 43 FOR UROLOGY 50M (MISCELLANEOUS) ×2 IMPLANT
CONT SPEC 4OZ STER OR WHT (MISCELLANEOUS) ×1
CONTAINER SPEC 2.5X3XGRAD LEK (MISCELLANEOUS) ×1 IMPLANT
CONTOUR STENT IMPLANT
DRAPE UTILITY 15X26 TOWEL STRL (DRAPES) ×2 IMPLANT
DRSG TEGADERM 2-3/8X2-3/4 SM (GAUZE/BANDAGES/DRESSINGS) ×2 IMPLANT
FIBER LASER LITHO 273 (Laser) ×2 IMPLANT
GLOVE BIO SURGEON STRL SZ 6.5 (GLOVE) ×2 IMPLANT
GOWN STRL REUS W/ TWL LRG LVL3 (GOWN DISPOSABLE) ×2 IMPLANT
GOWN STRL REUS W/ TWL LRG LVL4 (GOWN DISPOSABLE) ×2 IMPLANT
GOWN STRL REUS W/TWL LRG LVL3 (GOWN DISPOSABLE) ×2
GOWN STRL REUS W/TWL LRG LVL4 (GOWN DISPOSABLE) ×2
GUIDEWIRE SUPER STIFF (WIRE) ×2 IMPLANT
HOLDER FOLEY CATH W/STRAP (MISCELLANEOUS) IMPLANT
INTRODUCER DILATOR DOUBLE (INTRODUCER) IMPLANT
KIT RM TURNOVER CYSTO AR (KITS) ×2 IMPLANT
PACK CYSTO AR (MISCELLANEOUS) ×2 IMPLANT
SENSORWIRE 0.038 NOT ANGLED (WIRE)
SET CYSTO W/LG BORE CLAMP LF (SET/KITS/TRAYS/PACK) ×2 IMPLANT
SHEATH URETERAL 12FRX35CM (MISCELLANEOUS) IMPLANT
SOL .9 NS 3000ML IRR  AL (IV SOLUTION) ×1
SOL .9 NS 3000ML IRR UROMATIC (IV SOLUTION) ×1 IMPLANT
STENT URET 6FRX22 CONTOUR (STENTS) ×2 IMPLANT
STENT URET 6FRX24 CONTOUR (STENTS) IMPLANT
STENT URET 6FRX26 CONTOUR (STENTS) IMPLANT
SURGILUBE 2OZ TUBE FLIPTOP (MISCELLANEOUS) ×2 IMPLANT
SYRINGE IRR TOOMEY STRL 70CC (SYRINGE) ×2 IMPLANT
WATER STERILE IRR 1000ML POUR (IV SOLUTION) ×2 IMPLANT
WIRE SENSOR 0.038 NOT ANGLED (WIRE) IMPLANT

## 2016-01-16 NOTE — Discharge Instructions (Signed)
You have a ureteral stent in place.  This is a tube that extends from your kidney to your bladder.  This may cause urinary bleeding, burning with urination, and urinary frequency.  Please call our office or present to the ED if you develop fevers >101 or pain which is not able to be controlled with oral pain medications.  You may be given either Flomax and/ or ditropan to help with bladder spasms and stent pain in addition to pain medications.    Please untape and remove your stone on Friday AM.  If you have any trouble, please call our office.    [facility] [address], [place], [place] [address] [phone]   AMBULATORY SURGERY  DISCHARGE INSTRUCTIONS   1) The drugs that you were given will stay in your system until tomorrow so for the next 24 hours you should not:  A) Drive an automobile B) Make any legal decisions C) Drink any alcoholic beverage   2) You may resume regular meals tomorrow.  Today it is better to start with liquids and gradually work up to solid foods.  You may eat anything you prefer, but it is better to start with liquids, then soup and crackers, and gradually work up to solid foods.   3) Please notify your doctor immediately if you have any unusual bleeding, trouble breathing, redness and pain at the surgery site, drainage, fever, or pain not relieved by medication.    4) Additional Instructions:        Please contact your physician with any problems or Same Day Surgery at [phone], Monday through Friday 6 am to 4 pm, or [place] at [facility] number at [phone].

## 2016-01-16 NOTE — Progress Notes (Signed)
Cefazolin 1 g IV ordered prior to surgery for surgical prophylaxis per dosing protocol.  [name] [facility] 01/16/16 1:59 PM

## 2016-01-16 NOTE — Interval H&P Note (Signed)
History and Physical Interval Note:  01/16/2016 3:25 PM  Katie Bell  has presented today for surgery, with the diagnosis of NEPHROLITHIASIS  The various methods of treatment have been discussed with the patient and family. After consideration of risks, benefits and other options for treatment, the patient has consented to  Procedure(s): URETEROSCOPY WITH HOLMIUM LASER LITHOTRIPSY (Left) CYSTOSCOPY WITH STENT REPLACEMENT (Left) as a surgical intervention .  The patient's history has been reviewed, patient examined, no change in status, stable for surgery.  I have reviewed the patient's chart and labs.  Questions were answered to the patient's satisfaction.    RRR CTAB   Hollice Espy

## 2016-01-16 NOTE — Anesthesia Procedure Notes (Signed)
Procedure Name: Intubation Date/Time: 01/16/2016 3:57 PM Performed by: Doreen Salvage Pre-anesthesia Checklist: Patient identified, Patient being monitored, Timeout performed, Emergency Drugs available and Suction available Patient Re-evaluated:Patient Re-evaluated prior to inductionOxygen Delivery Method: Circle system utilized Preoxygenation: Pre-oxygenation with 100% oxygen Intubation Type: IV induction Ventilation: Mask ventilation without difficulty Laryngoscope Size: Mac and 3 Grade View: Grade I Tube type: Oral Tube size: 7.0 mm Number of attempts: 1 Airway Equipment and Method: Stylet Placement Confirmation: ETT inserted through vocal cords under direct vision,  positive ETCO2 and breath sounds checked- equal and bilateral Secured at: 19 cm Tube secured with: Tape Dental Injury: Teeth and Oropharynx as per pre-operative assessment

## 2016-01-16 NOTE — Telephone Encounter (Signed)
Last filled 12-06-15 #30 Last OV 08-26-15 No Future OV  Forward to Allie Bossier in Dr Alla German absence

## 2016-01-16 NOTE — Op Note (Signed)
Date of procedure: 01/16/16  Preoperative diagnosis:  1. Left ureteral stone 2. Left nonobstructing calculus    Postoperative diagnosis:  1. Same as above   Procedure: 1. Left ureteroscopy 2. Laser lithotripsy 3. Left ureteral stent placement 4. Left retrograde pyelogram 5. Basket extraction of Stone fragment  Surgeon: Hollice Espy, MD  Anesthesia: General  Complications: None  Intraoperative findings: 4 mm left distal stone and 6 mm left midpole stone treated  EBL: Minimal   Specimens: Stone fragment  Drains: 6 x 22 French double-J ureteral stent on left  Indication: Katie Bell is a 75 y.o. patient with a 4 mm left obstructing ureteral stones status post left ureteral stent placement as well as a 6 mm left mid pole stone she returns to the operating room today for definitive management of her stone.  After reviewing the management options for treatment,s he elected to proceed with the above surgical procedure(s). We have discussed the potential benefits and risks of the procedure, side effects of the proposed treatment, the likelihood of the patient achieving the goals of the procedure, and any potential problems that might occur during the procedure or recuperation. Informed consent has been obtained.  Description of procedure:  The patient was taken to the operating room and general anesthesia was induced.  The patient was placed in the dorsal lithotomy position, prepped and draped in the usual sterile fashion, and preoperative antibiotics were administered. A preoperative time-out was performed.   A 21 French cystoscope was advanced per urethra into the bladder. Attention was turned to the left ureteral orifice from which a renal stent was seen emanating. The distal coil of the stent was grasped and brought to the level of the urethral meatus. This is then cannulated using a sensor wire up to level of the kidney. The wire was snapped in place after the stent was removed. A  4.5 semirigid ureteroscope was then brought in and advanced alongside the wire into the distal ureter where a 4 mm stone was encountered. A 273  laser fiber was then used using the settings of 0.8 J and 10 Hz, the stone was fragmented into approximately 10 or so smaller pieces. Each of these pieces was then extracted using a 1.9 [place] nitinol basket until the entire ureter was clear of any stones or stone fragment. The Super Stiff wire was then advanced up to the kidney under fluoroscopic guidance. A flexible 8 French ureteroscope was then advanced up into the kidney without difficulty. The midpole stone was quickly encountered. The laser fiber was then used using the settings of 1.2 J and 15 Hz to dust the stone into very fine fragments, approximately size of the tip of the laser fiber. Once obliterated, the scope was backed to the level of the UPJ. Contrast was injected to perform a retrograde pyelogram which showed no extravasation, mild renal pelvic fullness from irrigation and created a roadmap of the kidney. Each never calyx was then directly visualized and no residual stones or stone fragments were identified. The scope was then backed down the length of the ureter inspecting along the way. A 6 x 22 French double-J ureteral stent was advanced over the wire up to level of the kidney. The wire was partially withdrawn until full coil was noted within the upper pole calyx. The wire was then fully withdrawn and a full coil was noted within the bladder. The bladder was drained using the cystoscope sheath. The string was left attached to the stent. The patient  was then cleaned and dried. The stent string was affixed to the patient's left inner thigh using Mastisol and Tegaderm. She was then repositioned the supine position, reversed from anesthesia, and taken to the PACU in stable condition.  Plan: Patient will remove her own stent on Friday. She'll follow-up in 4 weeks with a renal ultrasound prior.  Hollice Espy, M.D.

## 2016-01-16 NOTE — H&P (View-Only) (Signed)
01/02/2016 8:40 AM   Katie Bell Jan 24, 1941 [id number]  Referring provider: Venia Carbon, MD [place],  [id number]  Chief Complaint  Patient presents with  . Nephrolithiasis    HPI: 75 yo Female who underwent urgent cystoscopy with left ureteral stent for a 4 mm left UVJ stone 12/23/2015. She also has a 6 mm left lower pole stone. No recent urine cx.   She presents today to discuss treatment options. She continues daily Bactrim. Again there was no urine culture from the hospital. She's had no fever or chills. She has some mild stent pain.  PMH: Past Medical History:  Diagnosis Date  . Anxiety   . Depression   . Episodic mood disorder ([place])    mostly anxiety--depression in the past  . Gastric ulcer    History  . History of blood transfusion    At [facility] appro 15 yrs ago  . History of kidney stones 1960  . Hypertension   . Osteoarthritis, hand    hand  . Postmenopausal disorder   . SVD (spontaneous vaginal delivery)    x 3    Surgical History: Past Surgical History:  Procedure Laterality Date  . CHOLECYSTECTOMY  2008  . COLONOSCOPY  04/2011  . CYSTOCELE REPAIR N/A 12/31/2013   Procedure: ANTERIOR REPAIR (CYSTOCELE);  Surgeon: Donnamae Jude, MD;  Location: [facility];  Service: Gynecology;  Laterality: N/A;  . CYSTOSCOPY WITH STENT PLACEMENT Left 12/23/2015   Procedure: CYSTOSCOPY WITH STENT PLACEMENT;  Surgeon: Ardis Hughs, MD;  Location: [facility];  Service: Urology;  Laterality: Left;  . DIAGNOSTIC LAPAROSCOPY    . kidney stone removed    . MELANOMA EXCISION  2015   left posterior thigh  . TONSILLECTOMY  1944  . TUBAL LIGATION    . UPPER GI ENDOSCOPY    . WISDOM TOOTH EXTRACTION      Home Medications:    Medication List       Accurate as of 01/02/16  8:40 AM. Always use your most recent med list.          calcium carbonate 600 MG Tabs tablet Commonly known as:  OS-CAL Take 600 mg by mouth daily.     diazepam 10 MG tablet Commonly known as:  VALIUM TAKE 1/2 A TABLET BY MOUTH TWICE DAILY AS NEEDED FOR ANXIETY AND SLEEP   Fish Oil 1000 MG Caps Take 1,000 mg by mouth daily.   losartan-hydrochlorothiazide 50-12.5 MG tablet Commonly known as:  HYZAAR Take 1 tablet by mouth daily.   phenazopyridine 200 MG tablet Commonly known as:  PYRIDIUM Take 1 tablet (200 mg total) by mouth 3 (three) times daily as needed for pain.   sucralfate 1 g tablet Commonly known as:  CARAFATE Take 1 tablet (1 g total) by mouth 4 (four) times daily -  with meals and at bedtime.   sulfamethoxazole-trimethoprim 800-160 MG tablet Commonly known as:  BACTRIM DS,SEPTRA DS Take 1 tablet by mouth 2 (two) times daily.   traMADol 50 MG tablet Commonly known as:  ULTRAM Take 1 tablet (50 mg total) by mouth 2 (two) times daily.   vitamin C 500 MG tablet Commonly known as:  ASCORBIC ACID Take 500 mg by mouth daily.   Vitamin D 400 units capsule Take 800 Units by mouth daily.       Allergies:  Allergies  Allergen Reactions  . Morphine And Related Anxiety    Family History: Family History  Problem Relation Age of Onset  . Alzheimer's disease Mother   . Alcohol abuse Father   . COPD Brother   . Heart disease Neg Hx   . Cancer Neg Hx     Social History:  reports that she has never smoked. She has never used smokeless tobacco. She reports that she does not drink alcohol or use drugs.  ROS:                                        Physical Exam: There were no vitals taken for this visit.  Constitutional:  Alert and oriented, No acute distress. HEENT: [place] AT, moist mucus membranes.  Trachea midline, no masses. Cardiovascular: No clubbing, cyanosis, or edema. Respiratory: Normal respiratory effort, no increased work of breathing. Skin: No rashes, bruises or suspicious lesions. Lymph: No cervical or inguinal adenopathy. Neurologic: Grossly intact, no focal deficits, moving  all 4 extremities. Psychiatric: Normal mood and affect.  Laboratory Data: Lab Results  Component Value Date   WBC 8.4 12/23/2015   HGB 15.0 12/23/2015   HCT 42.7 12/23/2015   MCV 92.4 12/23/2015   PLT 150 12/23/2015    Lab Results  Component Value Date   CREATININE 1.15 (H) 12/23/2015    No results found for: PSA  No results found for: TESTOSTERONE  No results found for: HGBA1C  Urinalysis    Component Value Date/Time   COLORURINE YELLOW (A) 12/23/2015 0934   APPEARANCEUR HAZY (A) 12/23/2015 0934   LABSPEC 1.008 12/23/2015 [place] 6.0 12/23/2015 [place] 12/23/2015 0934   HGBUR 3+ (A) 12/23/2015 0934   BILIRUBINUR NEGATIVE 12/23/2015 0934   BILIRUBINUR negative 08/26/2015 1130   KETONESUR NEGATIVE 12/23/2015 0934   PROTEINUR 30 (A) 12/23/2015 0934   UROBILINOGEN 4.0 08/26/2015 1130   NITRITE POSITIVE (A) 12/23/2015 0934   LEUKOCYTESUR 3+ (A) 12/23/2015 0934    Pertinent Imaging: I reviewed CT images.   Assessment & Plan:    1. Nephrolithiasis, Left ureteral stone: I drew patient a picture the anatomy and CT findings. We discussed the nature risk and benefits of simply removing the stent, ureteroscopy or shockwave lithotripsy. All questions answered. She elected to proceed with ureteroscopy with laser lithotripsy which will afford [place] the opportunity to go up and get the left lower pole stone. We did discuss the chance of a staged procedure as well as a postop new ureteral stent for a short time. I did inform her one of my colleagues would likely be doing her procedure. She continues Bactrim DS today and has a few more days.   Festus Aloe, [facility] [address] [place], [place] [address] [phone]

## 2016-01-16 NOTE — Transfer of Care (Signed)
Immediate Anesthesia Transfer of Care Note  Patient: Katie Bell  Procedure(s) Performed: Procedure(s): URETEROSCOPY WITH HOLMIUM LASER LITHOTRIPSY (Left) CYSTOSCOPY WITH STENT REPLACEMENT (Left)  Patient Location: PACU  Anesthesia Type:General  Level of Consciousness: sedated  Airway & Oxygen Therapy: Patient Spontanous Breathing and Patient connected to face mask oxygen  Post-op Assessment: Report given to RN and Post -op Vital signs reviewed and stable  Post vital signs: Reviewed and stable  Last Vitals:  Vitals:   01/16/16 1311 01/16/16 1655  BP: (!) 165/86 (!) 155/90  Pulse: 81 (!) 102  Resp: 16 12  Temp: 36.6 C [id number] C    Complications: No apparent anesthesia complications

## 2016-01-16 NOTE — Anesthesia Preprocedure Evaluation (Signed)
Anesthesia Evaluation  Patient identified by MRN, date of birth, ID band Patient awake    Reviewed: Allergy & Precautions, H&P , NPO status , [name] Chart, lab work & pertinent test results, reviewed documented beta blocker date and time   Airway Mallampati: II  TM Distance: >3 FB Neck ROM: full    Dental  (+) Teeth Intact   Pulmonary neg pulmonary ROS,    Pulmonary exam normal        Cardiovascular Exercise Tolerance: Good hypertension, negative cardio ROS Normal cardiovascular exam Rate:Normal     Neuro/Psych PSYCHIATRIC DISORDERS negative neurological ROS  negative psych ROS   GI/Hepatic negative GI ROS, Neg liver ROS, PUD,   Endo/Other  negative endocrine ROS  Renal/GU negative Renal ROS  negative genitourinary   Musculoskeletal   Abdominal   Peds  Hematology negative hematology ROS (+)   Anesthesia Other Findings   Reproductive/Obstetrics negative OB ROS                             Anesthesia Physical Anesthesia Plan  ASA: III  Anesthesia Plan: General LMA   Post-op Pain Management:    Induction:   Airway Management Planned:   Additional Equipment:   Intra-op Plan:   Post-operative Plan:   Informed Consent: I have reviewed the patients History and Physical, chart, labs and discussed the procedure including the risks, benefits and alternatives for the proposed anesthesia with the patient or authorized representative who has indicated his/her understanding and acceptance.     Plan Discussed with: CRNA  Anesthesia Plan Comments:         Anesthesia Quick Evaluation

## 2016-01-17 ENCOUNTER — Encounter: Payer: Self-pay | Admitting: Urology

## 2016-01-17 MED ORDER — DIAZEPAM 10 MG PO TABS: ORAL_TABLET | ORAL | 0 refills | Status: DC

## 2016-01-17 NOTE — Telephone Encounter (Signed)
Left refill on voice mail at pharmacy  

## 2016-01-17 NOTE — Telephone Encounter (Signed)
Notified pt of 4 wk post op appt with RUS prior. Pt voices understanding.

## 2016-01-17 NOTE — Anesthesia Postprocedure Evaluation (Signed)
Anesthesia Post Note  Patient: LETZY OKON  Procedure(s) Performed: Procedure(s) (LRB): URETEROSCOPY WITH HOLMIUM LASER LITHOTRIPSY (Left) CYSTOSCOPY WITH STENT REPLACEMENT (Left)  Patient location during evaluation: PACU Anesthesia Type: General Level of consciousness: awake and alert Pain management: pain level controlled Vital Signs Assessment: post-procedure vital signs reviewed and stable Respiratory status: spontaneous breathing, nonlabored ventilation, respiratory function stable and patient connected to nasal cannula oxygen Cardiovascular status: blood pressure returned to baseline and stable Postop Assessment: no signs of nausea or vomiting Anesthetic complications: no    Last Vitals:  Vitals:   01/16/16 1748 01/16/16 1813  BP: (!) 151/55 136/69  Pulse: 68 62  Resp: 14 16  Temp: 36.8 C     Last Pain:  Vitals:   01/17/16 0822  TempSrc:   PainSc: 0-No pain                 Martha Clan

## 2016-01-26 LAB — STONE ANALYSIS
CA OXALATE, MONOHYDR.: 90 %
CA PHOS CRY STONE QL IR: 10 %
Stone Weight KSTONE: 5 mg

## 2016-01-27 ENCOUNTER — Other Ambulatory Visit: Payer: Commercial Managed Care - HMO | Admitting: Urology

## 2016-02-01 ENCOUNTER — Other Ambulatory Visit: Payer: Self-pay | Admitting: *Deleted

## 2016-02-01 ENCOUNTER — Other Ambulatory Visit: Payer: Self-pay

## 2016-02-01 MED ORDER — TRAMADOL HCL 50 MG PO TABS: 50.0000 mg | ORAL_TABLET | Freq: Two times a day (BID) | ORAL | 0 refills | Status: DC

## 2016-02-01 NOTE — Telephone Encounter (Signed)
Left refill on voice mail at pharmacy  

## 2016-02-01 NOTE — Telephone Encounter (Signed)
Wimer left v/m requesting refill tramadol. Last refilled # 30 on 12/23/15; last annual 08/26/15.

## 2016-02-01 NOTE — Telephone Encounter (Signed)
Approved: 30 x 0 

## 2016-02-14 ENCOUNTER — Ambulatory Visit
Admission: RE | Admit: 2016-02-14 | Discharge: 2016-02-14 | Disposition: A | Discharge status: Home / Self Care | Payer: Commercial Managed Care - HMO | Source: Ambulatory Visit | Attending: Urology | Admitting: Urology

## 2016-02-14 DIAGNOSIS — N200 Calculus of kidney: Secondary | ICD-10-CM | POA: Insufficient documentation

## 2016-02-14 DIAGNOSIS — N281 Cyst of kidney, acquired: Secondary | ICD-10-CM | POA: Diagnosis not present

## 2016-02-15 ENCOUNTER — Ambulatory Visit (INDEPENDENT_AMBULATORY_CARE_PROVIDER_SITE_OTHER): Payer: Commercial Managed Care - HMO | Admitting: Urology

## 2016-02-15 ENCOUNTER — Encounter: Payer: Self-pay | Admitting: Urology

## 2016-02-15 VITALS — BP 129/68 | HR 77 | Ht 61.0 in | Wt 120.0 lb

## 2016-02-15 DIAGNOSIS — N200 Calculus of kidney: Secondary | ICD-10-CM

## 2016-02-15 NOTE — Progress Notes (Signed)
02/15/2016 9:56 AM   Katie Bell 01-24-1941 [id number]  Referring provider: Venia Carbon, MD [place], [place] [address]  Chief Complaint  Patient presents with  . Routine Post Op    4 wk w/u/s results    HPI:  75 yo  female who returns today following left ureteroscopy , laser lithotripsy  for 4 mm left distal ureteral stone as well as nonobstructing calculus on the side on 01/16/16.  She tolerated the procedure well. She removed her own stent as scheduled.  Follow-up renal ultrasound today shows no evidence of residual stone burden or hydronephrosis.    Stone analysis shows 90% calcium oxalate monohydrate, 10% calcium phosphate.  She does have a personal history of kidney stones and passed one approximately 30 years ago spontaneously.  She admits to not drinking enough water and is chronically dehydrated. She does take calcium supplementation although has no personal history of osteoporosis.   PMH: Past Medical History:  Diagnosis Date  . Anxiety   . Cancer ([place]) 2015  . Depression   . Episodic mood disorder ([place])    mostly anxiety--depression in the past  . Gastric ulcer    History  . History of blood transfusion    At [facility] appro 15 yrs ago  . History of kidney stones 1960  . Hypertension   . Osteoarthritis, hand    hand  . Postmenopausal disorder   . SVD (spontaneous vaginal delivery)    x 3    Surgical History: Past Surgical History:  Procedure Laterality Date  . CHOLECYSTECTOMY  2008  . COLONOSCOPY  04/2011  . CYSTOCELE REPAIR N/A 12/31/2013   Procedure: ANTERIOR REPAIR (CYSTOCELE);  Surgeon: Donnamae Jude, MD;  Location: [facility];  Service: Gynecology;  Laterality: N/A;  . CYSTOSCOPY W/ URETERAL STENT PLACEMENT Left 01/16/2016   Procedure: CYSTOSCOPY WITH STENT REPLACEMENT;  Surgeon: Hollice Espy, MD;  Location: [facility];  Service: Urology;  Laterality: Left;  . CYSTOSCOPY WITH STENT PLACEMENT Left  12/23/2015   Procedure: CYSTOSCOPY WITH STENT PLACEMENT;  Surgeon: Ardis Hughs, MD;  Location: [facility];  Service: Urology;  Laterality: Left;  . DIAGNOSTIC LAPAROSCOPY    . kidney stone removed    . MELANOMA EXCISION  2015   left posterior thigh  . TONSILLECTOMY  1944  . TUBAL LIGATION    . UPPER GI ENDOSCOPY    . URETEROSCOPY WITH HOLMIUM LASER LITHOTRIPSY Left 01/16/2016   Procedure: URETEROSCOPY WITH HOLMIUM LASER LITHOTRIPSY;  Surgeon: Hollice Espy, MD;  Location: [facility];  Service: Urology;  Laterality: Left;  . WISDOM TOOTH EXTRACTION      Home Medications:    Medication List       Accurate as of 02/15/16  9:56 AM. Always use your most recent med list.          calcium carbonate 600 MG Tabs tablet Commonly known as:  OS-CAL Take 600 mg by mouth daily.   diazepam 10 MG tablet Commonly known as:  VALIUM TAKE 1/2 A TABLET BY MOUTH TWICE DAILY AS NEEDED FOR ANXIETY AND SLEEP   docusate sodium 100 MG capsule Commonly known as:  COLACE Take 1 capsule (100 mg total) by mouth 2 (two) times daily.   Fish Oil 1000 MG Caps Take 1,000 mg by mouth daily.   losartan-hydrochlorothiazide 50-12.5 MG tablet Commonly known as:  HYZAAR Take 1 tablet by mouth every morning.   sucralfate 1 g tablet Commonly known as:  CARAFATE  Take 1 tablet (1 g total) by mouth 4 (four) times daily -  with meals and at bedtime.   traMADol 50 MG tablet Commonly known as:  ULTRAM Take 1 tablet (50 mg total) by mouth 2 (two) times daily.   vitamin C 500 MG tablet Commonly known as:  ASCORBIC ACID Take 500 mg by mouth daily.   Vitamin D 400 units capsule Take 800 Units by mouth daily.       Allergies:  Allergies  Allergen Reactions  . Morphine     agitation  . Morphine And Related Anxiety    Family History: Family History  Problem Relation Age of Onset  . Alzheimer's disease Mother   . Alcohol abuse Father   . COPD Brother   . Heart disease Neg Hx   . Cancer Neg Hx      Social History:  reports that she has never smoked. She has never used smokeless tobacco. She reports that she does not drink alcohol or use drugs.  ROS: UROLOGY Frequent Urination?: No Hard to postpone urination?: No Burning/pain with urination?: No Get up at night to urinate?: No Leakage of urine?: No Urine stream starts and stops?: No Trouble starting stream?: No Do you have to strain to urinate?: No Blood in urine?: No Urinary tract infection?: No Sexually transmitted disease?: No Injury to kidneys or bladder?: No Painful intercourse?: No Weak stream?: No Currently pregnant?: No Vaginal bleeding?: No Last menstrual period?: n  Gastrointestinal Nausea?: No Vomiting?: No Indigestion/heartburn?: No Diarrhea?: No Constipation?: No  Constitutional Fever: No Night sweats?: No Weight loss?: No Fatigue?: No  Skin Skin rash/lesions?: No Itching?: No  Eyes Blurred vision?: No Double vision?: No  Ears/Nose/Throat Sore throat?: No Sinus problems?: No  Hematologic/Lymphatic Swollen glands?: No Easy bruising?: No  Cardiovascular Leg swelling?: No Chest pain?: No  Respiratory Cough?: No Shortness of breath?: No  Endocrine Excessive thirst?: No  Musculoskeletal Back pain?: No Joint pain?: No  Neurological Headaches?: No Dizziness?: No  Psychologic Depression?: No Anxiety?: No  Physical Exam: BP 129/68   Pulse 77   Ht 5\' 1"  (1.549 m)   Wt 120 lb (54.4 kg)   BMI 22.67 kg/m   Constitutional:  Alert and oriented, No acute distress. HEENT: [place] AT, moist mucus membranes.  Trachea midline, no masses. Cardiovascular: No clubbing, cyanosis, or edema. Respiratory: Normal respiratory effort, no increased work of breathing. GI: Abdomen is soft, nontender, nondistended, no abdominal masses GU: No CVA tenderness.  Skin: No rashes, bruises or suspicious lesions. Neurologic: Grossly intact, no focal deficits, moving all 4 extremities. Psychiatric:  Normal mood and affect.  Laboratory Data: Lab Results  Component Value Date   WBC 8.4 12/23/2015   HGB 15.0 12/23/2015   HCT 42.7 12/23/2015   MCV 92.4 12/23/2015   PLT 150 12/23/2015    Lab Results  Component Value Date   CREATININE 1.15 (H) 12/23/2015    Pertinent Imaging: RUS from 02/14/16 personally reviewed today  Assessment & Plan:    1. Kidney stones S/p uncomplicated left ureteroscopy Stone analysis reviewed today Follow-up renal ultrasound without hydronephrosis or residual stone burden  We discussed general stone prevention techniques including drinking plenty water with goal of producing 2.5 L urine daily, increased citric acid intake, avoidance of high oxalate containing foods, and decreased salt intake.  Information about dietary recommendations given today.   Additionally, I would not recommend additional calcium supplementation.  Follow up as needed.  Hollice Espy, MD  [facility] [address]  [address], [place], [place] [address] [phone]

## 2016-02-15 NOTE — Patient Instructions (Addendum)
Calcium oxalate stones  Dietary Guidelines to Help Prevent Kidney Stones Your risk of kidney stones can be decreased by adjusting the foods you eat. The most important thing you can do is drink enough fluid. You should drink enough fluid to keep your urine clear or pale yellow. The following guidelines provide specific information for the type of kidney stone you have had. GUIDELINES ACCORDING TO TYPE OF KIDNEY STONE Calcium Oxalate Kidney Stones  Reduce the amount of salt you eat. Foods that have a lot of salt cause your body to release excess calcium into your urine. The excess calcium can combine with a substance called oxalate to form kidney stones.  Reduce the amount of animal protein you eat if the amount you eat is excessive. Animal protein causes your body to release excess calcium into your urine. Ask your dietitian how much protein from animal sources you should be eating.  Avoid foods that are high in oxalates. If you take vitamins, they should have less than 500 mg of vitamin C. Your body turns vitamin C into oxalates. You do not need to avoid fruits and vegetables high in vitamin C. Calcium Phosphate Kidney Stones  Reduce the amount of salt you eat to help prevent the release of excess calcium into your urine.  Reduce the amount of animal protein you eat if the amount you eat is excessive. Animal protein causes your body to release excess calcium into your urine. Ask your dietitian how much protein from animal sources you should be eating.  Get enough calcium from food or take a calcium supplement (ask your dietitian for recommendations). Food sources of calcium that do not increase your risk of kidney stones include:  Broccoli.  Dairy products, such as cheese and yogurt.  Pudding. Uric Acid Kidney Stones  Do not have more than 6 oz of animal protein per day. FOOD SOURCES Animal Protein Sources  Meat (all types).  Poultry.  Eggs.  Fish, seafood. Foods High in  [employer] seasonings.  Soy sauce.  Teriyaki sauce.  Cured and processed meats.  Salted crackers and snack foods.  Fast food.  Canned soups and most canned foods. Foods High in Oxalates  Grains:  Amaranth.  Barley.  Grits.  Wheat germ.  Bran.  Buckwheat flour.  All bran cereals.  Pretzels.  Whole wheat bread.  Vegetables:  Beans (wax).  Beets and beet greens.  Collard greens.  Eggplant.  [place]ole.  Leeks.  Okra.  Parsley.  Rutabagas.  Spinach.  Swiss chard.  Tomato paste.  Fried potatoes.  Sweet potatoes.  Fruits:  Red currants.  Figs.  Kiwi.  Rhubarb.  Meat and Other Protein Sources:  Beans (dried).  Soy burgers and other soybean products.  Miso.  Nuts (peanuts, almonds, pecans, cashews, hazelnuts).  Nut butters.  Sesame seeds and tahini (paste made of sesame seeds).  Poppy seeds.  Beverages:  Chocolate drink mixes.  Soy milk.  Instant iced tea.  Juices made from high-oxalate fruits or vegetables.  Other:  Carob.  Chocolate.  Fruitcake.  Marmalades.   This information is not intended to replace advice given to you by your health care provider. Make sure you discuss any questions you have with your health care provider.   Document Released: 08/04/2010 Document Revised: 04/14/2013 Document Reviewed: 03/06/2013 Elsevier Interactive Patient Education [employer].

## 2016-03-01 ENCOUNTER — Other Ambulatory Visit: Payer: Self-pay

## 2016-03-01 MED ORDER — DIAZEPAM 10 MG PO TABS: ORAL_TABLET | ORAL | 0 refills | Status: DC

## 2016-03-01 MED ORDER — TRAMADOL HCL 50 MG PO TABS: 50.0000 mg | ORAL_TABLET | Freq: Two times a day (BID) | ORAL | 0 refills | Status: DC

## 2016-03-01 NOTE — Telephone Encounter (Signed)
Tramadol last filled 02-01-16 #30 Diazepam last filled 01-17-16 #30 Last OV 08-26-15 No Future OV

## 2016-03-01 NOTE — Telephone Encounter (Signed)
Left refills on voice mail at pharmacy  

## 2016-03-01 NOTE — Telephone Encounter (Signed)
Approved: #30 x 0 for each 

## 2016-03-06 DIAGNOSIS — L820 Inflamed seborrheic keratosis: Secondary | ICD-10-CM | POA: Diagnosis not present

## 2016-03-06 DIAGNOSIS — Z85820 Personal history of malignant melanoma of skin: Secondary | ICD-10-CM | POA: Diagnosis not present

## 2016-03-06 DIAGNOSIS — R234 Changes in skin texture: Secondary | ICD-10-CM | POA: Diagnosis not present

## 2016-03-31 ENCOUNTER — Other Ambulatory Visit: Payer: Self-pay | Admitting: Internal Medicine

## 2016-04-02 NOTE — Telephone Encounter (Signed)
Both Medications last filled 03-01-16 #30  Last OV 08-26-15 No Future OV  Forwarding to Allie Bossier in Dr Alla German absence

## 2016-04-03 NOTE — Telephone Encounter (Signed)
Left refills on voice mail at pharmacy  

## 2016-05-04 ENCOUNTER — Other Ambulatory Visit: Payer: Self-pay | Admitting: Internal Medicine

## 2016-05-04 NOTE — Telephone Encounter (Signed)
Both last filled 04-03-16 #30. Last OV 08-26-15 No Future OV  Spoke to pt to find out if she needed them right now or if it could wait for Dr Silvio Pate to return on Monday. She said she had medication to last until Monday or Tuesday.

## 2016-05-07 NOTE — Telephone Encounter (Signed)
Approved:  #30 x 0 for each Have her schedule her annual wellness that is due in May

## 2016-05-07 NOTE — Telephone Encounter (Signed)
Left refills on voice mail at pharmacy  Can you schedule her for a CPE in May? Thanks!!

## 2016-05-08 ENCOUNTER — Ambulatory Visit: Payer: Commercial Managed Care - HMO | Admitting: Family Medicine

## 2016-05-16 NOTE — Telephone Encounter (Signed)
sch AWV 10/17/16

## 2016-05-19 IMAGING — CR DG CHEST 1V PORT
1 series · 1 of 1 positions shown · non-contrast
Comparison: None currently available

CLINICAL DATA: Fever

EXAM:
PORTABLE CHEST - 1 VIEW

[portable]
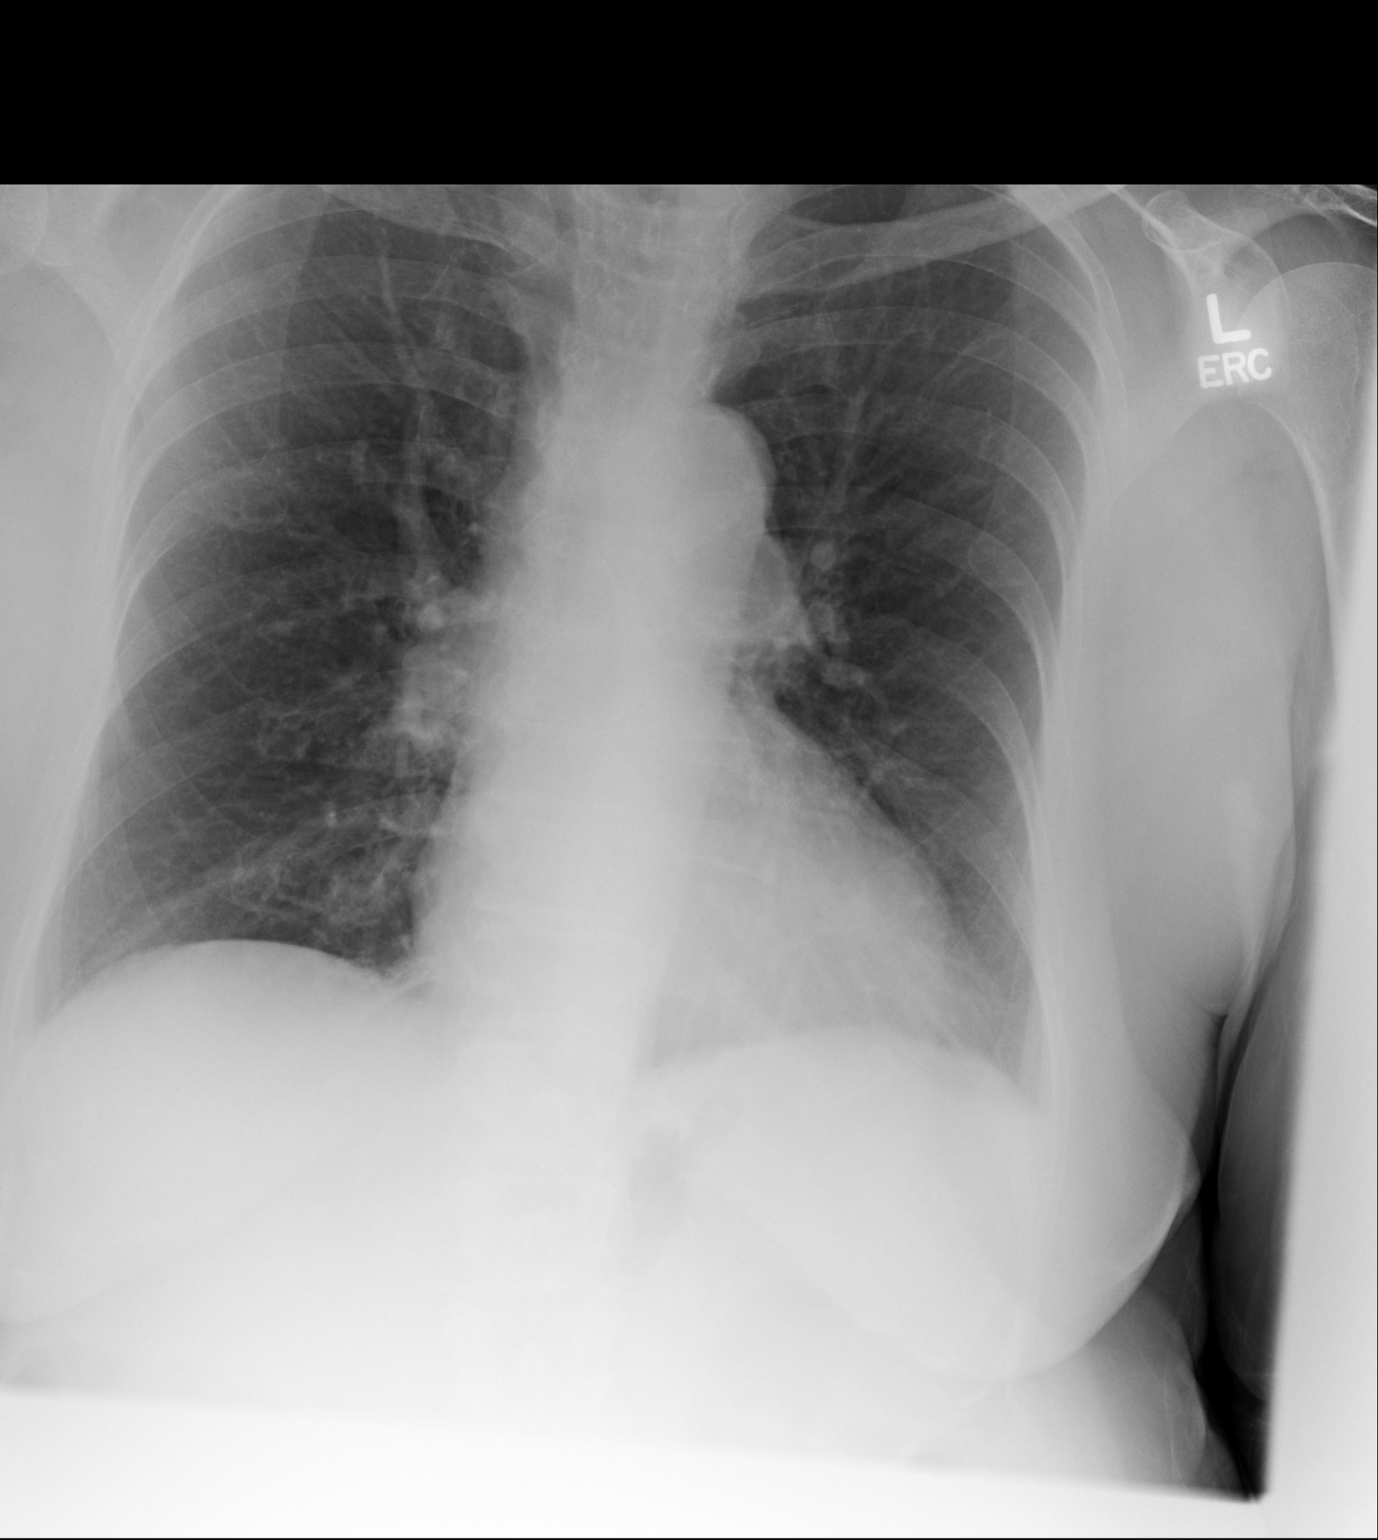

[1 of 1 positions shown; findings below may reference images not displayed]

FINDINGS: Normal heart size and mediastinal contours. No acute infiltrate or
edema. No effusion or pneumothorax. No acute osseous findings.
IMPRESSION: Negative portable chest.

## 2016-06-02 ENCOUNTER — Other Ambulatory Visit: Payer: Self-pay | Admitting: Internal Medicine

## 2016-06-04 NOTE — Telephone Encounter (Signed)
Both last filled #30 05-07-16 Last OV 08-26-15 Next OV 10-17-16  Forwarding to Dr Danise Mina in Dr Everardo Beals absence

## 2016-06-04 NOTE — Telephone Encounter (Signed)
plz phone in. 

## 2016-06-05 NOTE — Telephone Encounter (Signed)
Left refills on voice mail at pharmacy  

## 2016-06-13 DIAGNOSIS — L816 Other disorders of diminished melanin formation: Secondary | ICD-10-CM | POA: Diagnosis not present

## 2016-06-13 DIAGNOSIS — D1800 Hemangioma unspecified site: Secondary | ICD-10-CM | POA: Diagnosis not present

## 2016-06-13 DIAGNOSIS — L821 Other seborrheic keratosis: Secondary | ICD-10-CM | POA: Diagnosis not present

## 2016-06-13 DIAGNOSIS — D229 Melanocytic nevi, unspecified: Secondary | ICD-10-CM | POA: Diagnosis not present

## 2016-06-13 DIAGNOSIS — L812 Freckles: Secondary | ICD-10-CM | POA: Diagnosis not present

## 2016-06-13 DIAGNOSIS — Z1283 Encounter for screening for malignant neoplasm of skin: Secondary | ICD-10-CM | POA: Diagnosis not present

## 2016-06-13 DIAGNOSIS — L603 Nail dystrophy: Secondary | ICD-10-CM | POA: Diagnosis not present

## 2016-06-13 DIAGNOSIS — Z85820 Personal history of malignant melanoma of skin: Secondary | ICD-10-CM | POA: Diagnosis not present

## 2016-06-13 DIAGNOSIS — L578 Other skin changes due to chronic exposure to nonionizing radiation: Secondary | ICD-10-CM | POA: Diagnosis not present

## 2016-07-13 ENCOUNTER — Other Ambulatory Visit: Payer: Self-pay | Admitting: Internal Medicine

## 2016-07-13 NOTE — Telephone Encounter (Signed)
Both last filled 06-05-16 #30 Last OV 08-26-15 Next OV 10-17-16

## 2016-07-13 NOTE — Telephone Encounter (Signed)
Approved: #30 x 0 for each 

## 2016-07-13 NOTE — Telephone Encounter (Signed)
Left refills on voice mail at pharmacy  

## 2016-07-18 DIAGNOSIS — H40003 Preglaucoma, unspecified, bilateral: Secondary | ICD-10-CM | POA: Diagnosis not present

## 2016-07-19 ENCOUNTER — Ambulatory Visit (INDEPENDENT_AMBULATORY_CARE_PROVIDER_SITE_OTHER): Payer: Medicare HMO | Admitting: Family Medicine

## 2016-07-19 ENCOUNTER — Ambulatory Visit (INDEPENDENT_AMBULATORY_CARE_PROVIDER_SITE_OTHER)
Admission: RE | Admit: 2016-07-19 | Discharge: 2016-07-19 | Disposition: A | Discharge status: Home / Self Care | Payer: Medicare HMO | Source: Ambulatory Visit | Attending: Family Medicine | Admitting: Family Medicine

## 2016-07-19 ENCOUNTER — Encounter: Payer: Self-pay | Admitting: Family Medicine

## 2016-07-19 VITALS — BP 150/80 | HR 78 | Temp 97.9°F | Ht 61.0 in | Wt 123.5 lb

## 2016-07-19 DIAGNOSIS — S8992XA Unspecified injury of left lower leg, initial encounter: Secondary | ICD-10-CM | POA: Diagnosis not present

## 2016-07-19 DIAGNOSIS — M25562 Pain in left knee: Secondary | ICD-10-CM

## 2016-07-19 DIAGNOSIS — M1712 Unilateral primary osteoarthritis, left knee: Secondary | ICD-10-CM

## 2016-07-19 DIAGNOSIS — M2392 Unspecified internal derangement of left knee: Secondary | ICD-10-CM

## 2016-07-19 MED ORDER — METHYLPREDNISOLONE ACETATE 40 MG/ML IJ SUSP
80.0000 mg | Freq: Once | INTRAMUSCULAR | Status: AC
  Administered 2016-07-19: 80 mg via INTRA_ARTICULAR

## 2016-07-19 NOTE — Progress Notes (Signed)
Dr. Frederico Hamman T. Broox Lonigro, MD, [facility] [place], [address] Phone: [phone] Fax: [phone]  07/19/2016  Patient: Katie Bell, MRN: [id number], DOB: 1940/07/31, 76 y.o.  Primary Physician:  Viviana Simpler, MD   Chief Complaint  Patient presents with  . Left Knee Pain    Recent fall, concern Left knee is "out of place"   Subjective:   Katie Bell is a 76 y.o. very pleasant female patient who presents with the following:  1 month ago - fell. Since that time, she has been having some mechanical symptoms in her left knee which she describes as being "out of place ".  She is also having some pain, both medially and laterally, primarily on the joint lines.  There is minimal effusion.  Range of motion is been relatively preserved, but she is limping mildly.  No prior history of operative intervention in the affected knee.  No history of significant other trauma.  Past Medical History, Surgical History, Social History, Family History, Problem List, Medications, and Allergies have been reviewed and updated if relevant.  Patient Active Problem List   Diagnosis Date Noted  . Skin lesion of right leg 05/31/2014  . Visit for preventive health examination 02/16/2014  . Advanced directives, counseling/discussion 02/16/2014  . Cystitis 11/02/2013  . Cystocele 11/02/2013  . Hypertension   . Gastric ulcer   . Episodic mood disorder ([place])   . Osteoarthritis, multiple sites   . Postmenopausal disorder     Past Medical History:  Diagnosis Date  . Anxiety   . Cancer ([place]) 2015  . Depression   . Episodic mood disorder ([place])    mostly anxiety--depression in the past  . Gastric ulcer    History  . History of blood transfusion    At [facility] appro 15 yrs ago  . History of kidney stones 1960  . Hypertension   . Osteoarthritis, hand    hand  . Postmenopausal disorder   . SVD (spontaneous vaginal  delivery)    x 3    Past Surgical History:  Procedure Laterality Date  . CHOLECYSTECTOMY  2008  . COLONOSCOPY  04/2011  . CYSTOCELE REPAIR N/A 12/31/2013   Procedure: ANTERIOR REPAIR (CYSTOCELE);  Surgeon: Donnamae Jude, MD;  Location: [facility];  Service: Gynecology;  Laterality: N/A;  . CYSTOSCOPY W/ URETERAL STENT PLACEMENT Left 01/16/2016   Procedure: CYSTOSCOPY WITH STENT REPLACEMENT;  Surgeon: Hollice Espy, MD;  Location: [facility];  Service: Urology;  Laterality: Left;  . CYSTOSCOPY WITH STENT PLACEMENT Left 12/23/2015   Procedure: CYSTOSCOPY WITH STENT PLACEMENT;  Surgeon: Ardis Hughs, MD;  Location: [facility];  Service: Urology;  Laterality: Left;  . DIAGNOSTIC LAPAROSCOPY    . kidney stone removed    . MELANOMA EXCISION  2015   left posterior thigh  . TONSILLECTOMY  1944  . TUBAL LIGATION    . UPPER GI ENDOSCOPY    . URETEROSCOPY WITH HOLMIUM LASER LITHOTRIPSY Left 01/16/2016   Procedure: URETEROSCOPY WITH HOLMIUM LASER LITHOTRIPSY;  Surgeon: Hollice Espy, MD;  Location: [facility];  Service: Urology;  Laterality: Left;  . WISDOM TOOTH EXTRACTION      Social History   Social History  . Marital status: Divorced    Spouse name: N/A  . Number of children: 3  . Years of education: N/A   Occupational History  . LPN--retired     personal care on the side  Social History Main Topics  . Smoking status: Never Smoker  . Smokeless tobacco: Never Used  . Alcohol use No  . Drug use: No  . Sexual activity: Yes    Birth control/ protection: Post-menopausal   Other Topics Concern  . Not on file   Social History Narrative   3 sons--- middle one lives with her   [name]      No living will   Son [name]   Would accept resuscitation attempts but no prolonged ventilation   No tube feeds if cognitively unaware          Family History  Problem Relation Age of Onset  . Alzheimer's disease Mother   . Alcohol abuse Father   . COPD  Brother   . Heart disease Neg Hx   . Cancer Neg Hx     Allergies  Allergen Reactions  . Morphine     agitation  . Morphine And Related Anxiety    Medication list reviewed and updated in full in [facility].  GEN: No fevers, chills. Nontoxic. Primarily MSK c/o today. MSK: Detailed in the HPI GI: tolerating PO intake without difficulty Neuro: No numbness, parasthesias, or tingling associated. Otherwise the pertinent positives of the ROS are noted above.   Objective:   BP (!) 150/80 (BP Location: Left Arm, Patient Position: Sitting)   Pulse 78   Temp 97.9 F (36.6 C) (Oral)   Ht 5\' 1"  (1.549 m)   Wt 123 lb 8 oz (56 kg)   SpO2 96%   BMI 23.34 kg/m    GEN: WDWN, NAD, Non-toxic, Alert & Oriented x 3 HEENT: Atraumatic, Normocephalic.  Ears and Nose: No external deformity. EXTR: No clubbing/cyanosis/edema NEURO: Normal gait.  PSYCH: Normally interactive. Conversant. Not depressed or anxious appearing.  Calm demeanor.   Knee:  L Gait: Normal heel toe pattern ROM: 0-125 Effusion: neg Echymosis or edema: none Patellar tendon NT Painful PLICA: neg Patellar grind: + Medial and lateral patellar facet loading: negative medial and lateral joint lines: medial > lateral Mcmurray's + pain Flexion-pinch pos Varus and valgus stress: stable Lachman: neg Ant and Post drawer: neg Hip abduction, IR, ER: WNL Hip flexion str: 5/5 Hip abd: 5/5 Quad: 5/5 VMO atrophy:No Hamstring concentric and eccentric: 5/5   Radiology: Dg Knee Complete 4 Views Left  Result Date: 07/19/2016 CLINICAL DATA:  Pain following fall 1 month prior EXAM: LEFT KNEE - COMPLETE 4+ VIEW COMPARISON:  None. FINDINGS: Frontal, bilateral oblique, lateral, and sunrise patellar images were obtained. There is no fracture or dislocation. No joint effusion. There is mild narrowing of the patellofemoral joint. There is slight spurring medially and arising from the posterior patella superiorly. No erosive change.  IMPRESSION: Mild osteoarthritic change.  No fracture or joint effusion. Electronically Signed   By: Lowella Grip III M.D.   On: 07/19/2016 12:37     Assessment and Plan:   Acute pain of left knee - Plan: DG Knee Complete 4 Views Left, methylPREDNISolone acetate (DEPO-MEDROL) injection 80 mg  Internal derangement of left knee  Primary osteoarthritis of left knee  In a 76 year old with baseline osteoarthritis, mechanical symptoms, most likely the patient has got a meniscal tear from injury.  On x-ray, I think that the patient may also have a small loose body additionally.  We will continue with conservative care and management including basic icing, rest and altered activities, Tylenol or NSAIDs if needed for pain.  Knee Injection, L  Patient verbally consented to procedure. Risks (including potential rare risk of infection), benefits, and alternatives explained. Sterilely prepped with Chloraprep. Ethyl cholride used for anesthesia. 8 cc Lidocaine 1% mixed with 2 mL Depo-Medrol 40 mg injected using the anteromedial approach without difficulty. No complications with procedure and tolerated well. Patient had decreased pain post-injection.   Follow-up: 1 month if needed  Meds ordered this encounter  Medications  . methylPREDNISolone acetate (DEPO-MEDROL) injection 80 mg   There are no discontinued medications. Orders Placed This Encounter  Procedures  . DG Knee Complete 4 Views Left    Signed,  Camerin Ladouceur T. Jodee Wagenaar, MD   Allergies as of 07/19/2016      Reactions   Morphine    agitation   Morphine And Related Anxiety      Medication List       Accurate as of 07/19/16 11:59 PM. Always use your most recent med list.          calcium carbonate 600 MG Tabs tablet Commonly known as:  OS-CAL Take 600 mg by mouth daily.   diazepam 10 MG tablet Commonly known as:  VALIUM TAKE 1/2 TABLET BY MOUTH TWICE A DAY AS NEEDED FOR ANXIETY OR SLEEP   docusate sodium 100 MG  capsule Commonly known as:  COLACE Take 1 capsule (100 mg total) by mouth 2 (two) times daily.   Fish Oil 1000 MG Caps Take 1,000 mg by mouth daily.   losartan-hydrochlorothiazide 50-12.5 MG tablet Commonly known as:  HYZAAR Take 1 tablet by mouth every morning.   sucralfate 1 g tablet Commonly known as:  CARAFATE Take 1 tablet (1 g total) by mouth 4 (four) times daily -  with meals and at bedtime.   traMADol 50 MG tablet Commonly known as:  ULTRAM TAKE 1 TABLET BY MOUTH TWICE A DAY AS NEEDED FOR PAIN   vitamin C 500 MG tablet Commonly known as:  ASCORBIC ACID Take 500 mg by mouth daily.   Vitamin D 400 units capsule Take 800 Units by mouth daily.

## 2016-07-19 NOTE — Progress Notes (Signed)
Pre visit review using our clinic review tool, if applicable. No additional management support is needed unless otherwise documented below in the visit note. 

## 2016-07-31 DIAGNOSIS — R69 Illness, unspecified: Secondary | ICD-10-CM | POA: Diagnosis not present

## 2016-08-09 ENCOUNTER — Other Ambulatory Visit: Payer: Self-pay | Admitting: Internal Medicine

## 2016-08-09 NOTE — Telephone Encounter (Signed)
Both last filled 07-13-16 #30 Last OV 07-19-16 Next OV 10-17-16

## 2016-08-10 NOTE — Telephone Encounter (Signed)
Approved: #30 x 0 for each 

## 2016-08-10 NOTE — Telephone Encounter (Signed)
Left refill on voice mail at pharmacy  

## 2016-08-15 DIAGNOSIS — I8393 Asymptomatic varicose veins of bilateral lower extremities: Secondary | ICD-10-CM | POA: Diagnosis not present

## 2016-09-05 ENCOUNTER — Other Ambulatory Visit: Payer: Self-pay | Admitting: Internal Medicine

## 2016-09-05 NOTE — Telephone Encounter (Signed)
Left refill on voice mail at pharmacy  

## 2016-09-05 NOTE — Telephone Encounter (Signed)
Last filled 08-10-16 #30 Last OV/CPE 08-26-15 Acute OV 07-19-16 Next OV/CPE 10-17-16

## 2016-09-05 NOTE — Telephone Encounter (Signed)
Approved: 30 x 0 

## 2016-09-18 ENCOUNTER — Other Ambulatory Visit: Payer: Self-pay | Admitting: Internal Medicine

## 2016-09-18 NOTE — Telephone Encounter (Signed)
Approved: 30 x 0 

## 2016-09-18 NOTE — Telephone Encounter (Signed)
Left refill on voice mail at pharmacy  

## 2016-09-18 NOTE — Telephone Encounter (Signed)
Last filled 08-10-16 #30 Last OV 07-19-16 Next OV 10-17-16

## 2016-09-19 ENCOUNTER — Telehealth: Payer: Self-pay | Admitting: Internal Medicine

## 2016-09-19 ENCOUNTER — Ambulatory Visit (INDEPENDENT_AMBULATORY_CARE_PROVIDER_SITE_OTHER): Payer: Medicare HMO | Admitting: Primary Care

## 2016-09-19 ENCOUNTER — Encounter: Payer: Self-pay | Admitting: Primary Care

## 2016-09-19 VITALS — BP 130/86 | HR 74 | Temp 97.7°F | Ht 61.0 in | Wt 124.4 lb

## 2016-09-19 DIAGNOSIS — J302 Other seasonal allergic rhinitis: Secondary | ICD-10-CM

## 2016-09-19 DIAGNOSIS — N390 Urinary tract infection, site not specified: Secondary | ICD-10-CM | POA: Diagnosis not present

## 2016-09-19 DIAGNOSIS — N3090 Cystitis, unspecified without hematuria: Secondary | ICD-10-CM | POA: Diagnosis not present

## 2016-09-19 LAB — POC URINALSYSI DIPSTICK (AUTOMATED)
BILIRUBIN UA: NEGATIVE
GLUCOSE UA: NEGATIVE
Ketones, UA: NEGATIVE
NITRITE UA: NEGATIVE
PH UA: 6 (ref 5.0–8.0)
Protein, UA: NEGATIVE
RBC UA: NEGATIVE
Spec Grav, UA: 1.02 (ref 1.010–1.025)
Urobilinogen, UA: 0.2 E.U./dL

## 2016-09-19 MED ORDER — LORATADINE 10 MG PO TABS: ORAL_TABLET | ORAL | 1 refills | Status: DC

## 2016-09-19 MED ORDER — CEPHALEXIN 500 MG PO CAPS: 500.0000 mg | ORAL_CAPSULE | Freq: Two times a day (BID) | ORAL | 0 refills | Status: DC

## 2016-09-19 MED ORDER — FLUTICASONE PROPIONATE 50 MCG/ACT NA SUSP: 1.0000 | Freq: Two times a day (BID) | NASAL | 1 refills | Status: DC

## 2016-09-19 NOTE — Assessment & Plan Note (Signed)
Symptoms and urinalysis today suggestive up potential UTI. UA: 2+ leuks, negative nitrites, negative blood. Culture sent. Prescription for Keflex course sent to pharmacy. Increase consumption of water. She is stable for outpatient treatment at this time.

## 2016-09-19 NOTE — Progress Notes (Addendum)
Subjective:    Patient ID: Katie Bell, female    DOB: 01-13-1941, 76 y.o.   MRN: [id number]  HPI  Katie Bell is a 76 year old female with history of UTI, renal stones, cystocele who presents today with a chief complaint of urinary frequency. She also reports dysuria, dizziness, sinus pressure.   Her symptoms of dysuria and frequency began about two days ago. She denies hematuria, vaginal discharge, vaginal itching, pelvic pain. She's not taken anything over-the-counter for her symptoms.  Her sinus pressure began three weeks ago. She also reports post nasal drip, rhinorrhea, ear pressure. She's blowing clean mucous from her nasal cavity. She denies fevers, cough. She's not used anything over-the-counter for her symptoms.  Review of Systems  Constitutional: Negative for fever.  HENT: Positive for congestion, ear pain, postnasal drip and sinus pressure. Negative for sore throat.   Respiratory: Negative for cough.   Gastrointestinal: Negative for abdominal pain and nausea.  Genitourinary: Positive for dysuria and frequency. Negative for hematuria, pelvic pain and vaginal discharge.  Neurological: Positive for dizziness.       Past Medical History:  Diagnosis Date  . Anxiety   . Cancer ([place]) 2015  . Depression   . Episodic mood disorder ([place])    mostly anxiety--depression in the past  . Gastric ulcer    History  . History of blood transfusion    At [facility] appro 15 yrs ago  . History of kidney stones 1960  . Hypertension   . Osteoarthritis, hand    hand  . Postmenopausal disorder   . SVD (spontaneous vaginal delivery)    x 3     Social History   Social History  . Marital status: Divorced    Spouse name: N/A  . Number of children: 3  . Years of education: N/A   Occupational History  . LPN--retired     personal care on the side   Social History Main Topics  . Smoking status: Never Smoker  . Smokeless tobacco: Never Used  . Alcohol use No    . Drug use: No  . Sexual activity: Yes    Birth control/ protection: Post-menopausal   Other Topics Concern  . Not on file   Social History Narrative   3 sons--- middle one lives with her   Goes by Katie Bell      No living will   Son Katie Bell should make decisions for her   Would accept resuscitation attempts but no prolonged ventilation   No tube feeds if cognitively unaware          Past Surgical History:  Procedure Laterality Date  . CHOLECYSTECTOMY  2008  . COLONOSCOPY  04/2011  . CYSTOCELE REPAIR N/A 12/31/2013   Procedure: ANTERIOR REPAIR (CYSTOCELE);  Surgeon: Donnamae Jude, MD;  Location: [facility];  Service: Gynecology;  Laterality: N/A;  . CYSTOSCOPY W/ URETERAL STENT PLACEMENT Left 01/16/2016   Procedure: CYSTOSCOPY WITH STENT REPLACEMENT;  Surgeon: Hollice Espy, MD;  Location: [facility];  Service: Urology;  Laterality: Left;  . CYSTOSCOPY WITH STENT PLACEMENT Left 12/23/2015   Procedure: CYSTOSCOPY WITH STENT PLACEMENT;  Surgeon: Ardis Hughs, MD;  Location: [facility];  Service: Urology;  Laterality: Left;  . DIAGNOSTIC LAPAROSCOPY    . kidney stone removed    . MELANOMA EXCISION  2015   left posterior thigh  . TONSILLECTOMY  1944  . TUBAL LIGATION    . UPPER GI ENDOSCOPY    . URETEROSCOPY WITH  HOLMIUM LASER LITHOTRIPSY Left 01/16/2016   Procedure: URETEROSCOPY WITH HOLMIUM LASER LITHOTRIPSY;  Surgeon: Hollice Espy, MD;  Location: [facility];  Service: Urology;  Laterality: Left;  . WISDOM TOOTH EXTRACTION      Family History  Problem Relation Age of Onset  . Alzheimer's disease Mother   . Alcohol abuse Father   . COPD Brother   . Heart disease Neg Hx   . Cancer Neg Hx     Allergies  Allergen Reactions  . Morphine     agitation  . Morphine And Related Anxiety    Current Outpatient Prescriptions on File Prior to Visit  Medication Sig Dispense Refill  . calcium carbonate (OS-CAL) 600 MG TABS tablet Take 600 mg by mouth daily.    . Cholecalciferol (VITAMIN D)  400 UNITS capsule Take 800 Units by mouth daily.    Marland Kitchen losartan-hydrochlorothiazide (HYZAAR) 50-12.5 MG tablet TAKE 1 TABLET BY MOUTH ONCE DAILY 90 tablet 3  . Omega-3 Fatty Acids (FISH OIL) 1000 MG CAPS Take 1,000 mg by mouth daily.     . sucralfate (CARAFATE) 1 g tablet Take 1 tablet (1 g total) by mouth 4 (four) times daily -  with meals and at bedtime. 120 tablet 11  . vitamin C (ASCORBIC ACID) 500 MG tablet Take 500 mg by mouth daily.    . diazepam (VALIUM) 10 MG tablet TAKE 1/2 TABLET BY MOUTH 2 TIMES DAILY AS NEEDED FOR ANXIETY OR SLEEP (Patient not taking: Reported on 09/19/2016) 30 tablet 0  . docusate sodium (COLACE) 100 MG capsule Take 1 capsule (100 mg total) by mouth 2 (two) times daily. (Patient not taking: Reported on 07/19/2016) 60 capsule 0  . traMADol (ULTRAM) 50 MG tablet TAKE 1 TABLET BY MOUTH 2 TIMES A DAY AS NEEDED FOR PAIN (Patient not taking: Reported on 09/19/2016) 30 tablet 0   No current facility-administered medications on file prior to visit.     BP 130/86   Pulse 74   Temp 97.7 F (36.5 C) (Oral)   Ht 5\' 1"  (1.549 m)   Wt 124 lb 6.4 oz (56.4 kg)   SpO2 96%   BMI 23.51 kg/m    Objective:   Physical Exam  Constitutional: She appears well-nourished. She does not appear ill.  HENT:  Right Ear: Tympanic membrane and ear canal normal.  Left Ear: Tympanic membrane and ear canal normal.  Nose: Mucosal edema present. Right sinus exhibits no maxillary sinus tenderness and no frontal sinus tenderness. Left sinus exhibits no maxillary sinus tenderness and no frontal sinus tenderness.  Mouth/Throat: Oropharynx is clear and moist.  Eyes: Conjunctivae are normal.  Neck: Neck supple.  Cardiovascular: Normal rate and regular rhythm.   Pulmonary/Chest: Effort normal and breath sounds normal. She has no wheezes. She has no rales.  Abdominal: There is no CVA tenderness.  Lymphadenopathy:    She has no cervical adenopathy.  Skin: Skin is warm and dry.            Assessment & Plan:  Allergic rhinitis:  Sinus pressure, postnasal drip, congestion 3 weeks. Exam today is not suggestive of acute infection. We'll have her start Claritin daily and Flonase twice daily. She will update if no improvement.  Sheral Flow, NP

## 2016-09-19 NOTE — Telephone Encounter (Signed)
Pt is requesting appt for L knee inj. Knee has started clicking since first inj. Any idea when to sch?

## 2016-09-19 NOTE — Telephone Encounter (Signed)
He has opening the coming Monday 09/24/2016 in the afternoon.  Please schedule 30 minute appointment.

## 2016-09-19 NOTE — Patient Instructions (Signed)
Start Cephalexin antibiotics. Take 1 capsule by mouth twice daily for 7 days.  Nasal Congestion/Ear Pressure: Try using Flonase (fluticasone) nasal spray. Instill 1 spray in each nostril twice daily.   You may take Claritin 10 mg tablets for allergy symptoms. Take 1 tablet by mouth once daily.  Ensure you are staying hydrated with water and rest.  It was a pleasure meeting you!

## 2016-09-21 LAB — URINE CULTURE

## 2016-09-27 ENCOUNTER — Ambulatory Visit: Payer: Medicare HMO | Admitting: Family Medicine

## 2016-10-01 ENCOUNTER — Encounter: Payer: Self-pay | Admitting: Family Medicine

## 2016-10-01 ENCOUNTER — Ambulatory Visit (INDEPENDENT_AMBULATORY_CARE_PROVIDER_SITE_OTHER): Payer: Medicare HMO | Admitting: Family Medicine

## 2016-10-01 VITALS — BP 133/90 | HR 87 | Ht 61.0 in | Wt 125.0 lb

## 2016-10-01 DIAGNOSIS — N8110 Cystocele, unspecified: Secondary | ICD-10-CM | POA: Diagnosis not present

## 2016-10-01 NOTE — Progress Notes (Signed)
   Subjective:    Patient ID: Katie Bell is a 76 y.o. female presenting with Vaginal Prolapse  on 10/01/2016  HPI: Patient has h/o anterior repair in 9/15 with excellent resultTried to lift someone 3-4 weeks ago. She has previously had an anterior repair. Noted pain immediately. Can feel something poking out of her. She continues to be sexually active and desires definitive treatment.  Review of Systems  Constitutional: Negative for chills and fever.  Respiratory: Negative for shortness of breath.   Cardiovascular: Negative for chest pain.  Gastrointestinal: Negative for abdominal pain, nausea and vomiting.  Genitourinary: Negative for dysuria.  Skin: Negative for rash.      Objective:    BP 133/90   Pulse 87   Ht 5\' 1"  (1.549 m)   Wt 125 lb (56.7 kg)   BMI 23.62 kg/m  Physical Exam  Constitutional: She is oriented to person, place, and time. She appears well-developed and well-nourished. No distress.  HENT:  Head: Normocephalic and atraumatic.  Eyes: No scleral icterus.  Neck: Neck supple.  Cardiovascular: Normal rate.   Pulmonary/Chest: Effort normal.  Abdominal: Soft.  Genitourinary:  Genitourinary Comments: Patient has minimal bulging in the supine position. The uterus is well supported. In the upright position a large cystocele is noted filling the vagina.  Neurological: She is alert and oriented to person, place, and time.  Skin: Skin is warm and dry.  Psychiatric: She has a normal mood and affect.        Assessment & Plan:   Problem List Items Addressed This Visit      Unprioritized   Female cystocele - Primary    Given previous surgery and risk of age and surgery, would prefer no treatment. She is really without symptoms and only noticed this when trying to insert vaginal yeast meds. She is sexually active about 1x q 2-3 months. Again advised her to not have surgery but she is quite adamant and does not care if she dies on the table getting her bladder  repaired. Will refer to Uro/GYN due to previous surgery.      Relevant Orders   Ambulatory referral to Urogynecology      Total face-to-face time with patient: 15 minutes. Over 50% of encounter was spent on counseling and coordination of care. Return in about 4 weeks (around 10/29/2016).  Donnamae Jude 10/01/2016 4:26 PM

## 2016-10-01 NOTE — Patient Instructions (Signed)

## 2016-10-01 NOTE — Assessment & Plan Note (Signed)
Given previous surgery and risk of age and surgery, would prefer no treatment. She is really without symptoms and only noticed this when trying to insert vaginal yeast meds. She is sexually active about 1x q 2-3 months. Again advised her to not have surgery but she is quite adamant and does not care if she dies on the table getting her bladder repaired. Will refer to Uro/GYN due to previous surgery.

## 2016-10-02 ENCOUNTER — Encounter: Payer: Self-pay | Admitting: Radiology

## 2016-10-03 ENCOUNTER — Encounter: Payer: Self-pay | Admitting: Family Medicine

## 2016-10-03 ENCOUNTER — Ambulatory Visit (INDEPENDENT_AMBULATORY_CARE_PROVIDER_SITE_OTHER): Payer: Medicare HMO | Admitting: Family Medicine

## 2016-10-03 VITALS — BP 120/70 | HR 69 | Temp 97.5°F | Ht 61.0 in | Wt 124.2 lb

## 2016-10-03 DIAGNOSIS — M170 Bilateral primary osteoarthritis of knee: Secondary | ICD-10-CM

## 2016-10-03 MED ORDER — METHYLPREDNISOLONE ACETATE 40 MG/ML IJ SUSP
80.0000 mg | Freq: Once | INTRAMUSCULAR | Status: AC
  Administered 2016-10-03: 80 mg via INTRA_ARTICULAR

## 2016-10-03 NOTE — Addendum Note (Signed)
Addended by: Carter Kitten on: 10/03/2016 08:50 AM   Modules accepted: Orders

## 2016-10-03 NOTE — Progress Notes (Signed)
Dr. Frederico Hamman T. Senna Lape, MD, [facility] [place], [address] Phone: [phone] Fax: [phone]  10/03/2016  Patient: Katie Bell, MRN: [id number], DOB: 02-Feb-1941, 76 y.o.  Primary Physician:  Venia Carbon, MD   Chief Complaint  Patient presents with  . Knee Pain    Bilateral-Wants injections in both   Subjective:   Katie Bell is a 76 y.o. very pleasant female patient who presents with the following:  B knee inj, OA known. Most recent knee x-rays are reviewed personally in the office again. She had a fall in March of this year, knees worse since that time. We did a L knee injection 3 months ago.  L knee inj - at least 6-8 weeks of pain relief.  Not very interested in surgical options. Taking Tylenol for pain, rare tramadol.   Past Medical History, Surgical History, Social History, Family History, Problem List, Medications, and Allergies have been reviewed and updated if relevant.  Patient Active Problem List   Diagnosis Date Noted  . Skin lesion of right leg 05/31/2014  . Visit for preventive health examination 02/16/2014  . Advanced directives, counseling/discussion 02/16/2014  . Cystitis 11/02/2013  . Female cystocele 11/02/2013  . Hypertension   . Gastric ulcer   . Episodic mood disorder ([place])   . Osteoarthritis, multiple sites   . Postmenopausal disorder     Past Medical History:  Diagnosis Date  . Anxiety   . Cancer ([place]) 2015  . Depression   . Episodic mood disorder ([place])    mostly anxiety--depression in the past  . Gastric ulcer    History  . History of blood transfusion    At [facility] appro 15 yrs ago  . History of kidney stones 1960  . Hypertension   . Osteoarthritis, hand    hand  . Postmenopausal disorder   . SVD (spontaneous vaginal delivery)    x 3    Past Surgical History:  Procedure Laterality Date  . CHOLECYSTECTOMY  2008  . COLONOSCOPY   04/2011  . CYSTOCELE REPAIR N/A 12/31/2013   Procedure: ANTERIOR REPAIR (CYSTOCELE);  Surgeon: Donnamae Jude, MD;  Location: [facility];  Service: Gynecology;  Laterality: N/A;  . CYSTOSCOPY W/ URETERAL STENT PLACEMENT Left 01/16/2016   Procedure: CYSTOSCOPY WITH STENT REPLACEMENT;  Surgeon: Hollice Espy, MD;  Location: [facility];  Service: Urology;  Laterality: Left;  . CYSTOSCOPY WITH STENT PLACEMENT Left 12/23/2015   Procedure: CYSTOSCOPY WITH STENT PLACEMENT;  Surgeon: Ardis Hughs, MD;  Location: [facility];  Service: Urology;  Laterality: Left;  . DIAGNOSTIC LAPAROSCOPY    . kidney stone removed    . MELANOMA EXCISION  2015   left posterior thigh  . TONSILLECTOMY  1944  . TUBAL LIGATION    . UPPER GI ENDOSCOPY    . URETEROSCOPY WITH HOLMIUM LASER LITHOTRIPSY Left 01/16/2016   Procedure: URETEROSCOPY WITH HOLMIUM LASER LITHOTRIPSY;  Surgeon: Hollice Espy, MD;  Location: [facility];  Service: Urology;  Laterality: Left;  . WISDOM TOOTH EXTRACTION      Social History   Social History  . Marital status: Divorced    Spouse name: N/A  . Number of children: 3  . Years of education: N/A   Occupational History  . LPN--retired     personal care on the side   Social History Main Topics  . Smoking status: Never Smoker  . Smokeless tobacco: Never Used  . Alcohol  use No  . Drug use: No  . Sexual activity: Yes    Birth control/ protection: Post-menopausal   Other Topics Concern  . Not on file   Social History Narrative   3 sons--- middle one lives with her   [name]      No living will   Son [name]   Would accept resuscitation attempts but no prolonged ventilation   No tube feeds if cognitively unaware          Family History  Problem Relation Age of Onset  . Alzheimer's disease Mother   . Alcohol abuse Father   . COPD Brother   . Heart disease Neg Hx   . Cancer Neg Hx     Allergies  Allergen Reactions  . Morphine     agitation  .  Morphine And Related Anxiety    Medication list reviewed and updated in full in [place].  GEN: No fevers, chills. Nontoxic. Primarily MSK c/o today. MSK: Detailed in the HPI GI: tolerating PO intake without difficulty Neuro: No numbness, parasthesias, or tingling associated. Otherwise the pertinent positives of the ROS are noted above.   Objective:   BP 120/70   Pulse 69   Temp 97.5 F (36.4 C) (Oral)   Ht 5\' 1"  (1.549 m)   Wt 124 lb 4 oz (56.4 kg)   BMI 23.48 kg/m    GEN: WDWN, NAD, Non-toxic, Alert & Oriented x 3 HEENT: Atraumatic, Normocephalic.  Ears and Nose: No external deformity. EXTR: No clubbing/cyanosis/edema NEURO: Normal gait.  PSYCH: Normally interactive. Conversant. Not depressed or anxious appearing.  Calm demeanor.   Knee:  B Gait: Normal heel toe pattern ROM: 0-115 Effusion: neg Echymosis or edema: none Patellar tendon NT Painful PLICA: neg Patellar grind: negative Medial and lateral patellar facet loading: negative medial and lateral joint lines: medial and lateral mild pain B Mcmurray's neg Flexion-pinch pain Varus and valgus stress: stable Lachman: neg Ant and Post drawer: neg Hip abduction, IR, ER: WNL Hip flexion str: 5/5 Hip abd: 5/5 Quad: 5/5 VMO atrophy mild Hamstring concentric and eccentric: 5/5   Radiology: Dg Knee Complete 4 Views Left  Result Date: 07/19/2016 CLINICAL DATA:  Pain following fall 1 month prior EXAM: LEFT KNEE - COMPLETE 4+ VIEW COMPARISON:  None. FINDINGS: Frontal, bilateral oblique, lateral, and sunrise patellar images were obtained. There is no fracture or dislocation. No joint effusion. There is mild narrowing of the patellofemoral joint. There is slight spurring medially and arising from the posterior patella superiorly. No erosive change. IMPRESSION: Mild osteoarthritic change.  No fracture or joint effusion. Electronically Signed   By: Lowella Grip III M.D.   On: 07/19/2016 12:37   Assessment and  Plan:   Arthritis of both knees  OA, deg meniscal tear possible on L given fall history. B corticosteroid inj. Consider Synvisc if recur.  Knee Injection, RIGHT Patient verbally consented to procedure. Risks (including potential rare risk of infection), benefits, and alternatives explained. Sterilely prepped with Chloraprep. Ethyl cholride used for anesthesia. 8 cc Lidocaine 1% mixed with 2 mL Depo-Medrol 40 mg injected using the anteromedial approach without difficulty. No complications with procedure and tolerated well. Patient had decreased pain post-injection.   Knee Injection, LEFT Patient verbally consented to procedure. Risks (including potential rare risk of infection), benefits, and alternatives explained. Sterilely prepped with Chloraprep. Ethyl cholride used for anesthesia. 8 cc Lidocaine 1% mixed with 2 mL Depo-Medrol 40 mg injected using  the anteromedial approach without difficulty. No complications with procedure and tolerated well. Patient had decreased pain post-injection.   Follow-up: prn  Future Appointments Date Time Provider [place]  10/17/2016 9:30 AM Venia Carbon, MD [facility]  11/02/2016 10:00 AM Donnamae Jude, MD [facility]    No orders of the defined types were placed in this encounter.  There are no discontinued medications. No orders of the defined types were placed in this encounter.   Signed,  Maud Deed. Laryssa Hassing, MD   Allergies as of 10/03/2016      Reactions   Morphine    agitation   Morphine And Related Anxiety      Medication List       Accurate as of 10/03/16  8:42 AM. Always use your most recent med list.          diazepam 10 MG tablet Commonly known as:  VALIUM TAKE 1/2 TABLET BY MOUTH 2 TIMES DAILY AS NEEDED FOR ANXIETY OR SLEEP   Fish Oil 1000 MG Caps Take 1,000 mg by mouth daily.   fluticasone 50 MCG/ACT nasal spray Commonly known as:  FLONASE Place 1 spray into both nostrils 2 (two) times  daily.   losartan-hydrochlorothiazide 50-12.5 MG tablet Commonly known as:  HYZAAR TAKE 1 TABLET BY MOUTH ONCE DAILY   sucralfate 1 g tablet Commonly known as:  CARAFATE Take 1 tablet (1 g total) by mouth 4 (four) times daily -  with meals and at bedtime.   traMADol 50 MG tablet Commonly known as:  ULTRAM TAKE 1 TABLET BY MOUTH 2 TIMES A DAY AS NEEDED FOR PAIN   vitamin C 500 MG tablet Commonly known as:  ASCORBIC ACID Take 500 mg by mouth daily.   Vitamin D 400 units capsule Take 800 Units by mouth daily.

## 2016-10-15 ENCOUNTER — Telehealth: Payer: Self-pay

## 2016-10-15 NOTE — Telephone Encounter (Signed)
PLEASE NOTE: All timestamps contained within this report are represented as [place] Standard Time. CONFIDENTIALTY NOTICE: This fax transmission is intended only for the addressee. It contains information that is legally privileged, confidential or otherwise protected from use or disclosure. If you are not the intended recipient, you are strictly prohibited from reviewing, disclosing, copying using or disseminating any of this information or taking any action in reliance on or regarding this information. If you have received this fax in error, please notify us immediately by telephone so that we can arrange for its return to [place]. Phone: [phone], Toll-Free: [phone], Fax: [phone] Page: 1 of 1 Call Id: [id number] [place] Day - Client Nonclinical Telephone Record [place] Day - Client Client Site [place] - Day Contact Type Call Who Is Calling Patient / Member / Family / Caregiver Caller Name [place] Phone Number [phone] Patient Name Katie Bell Call Type Message Only Information Provided Reason for Call Request for [place] is returning call about her appt Additional Comment Call Closed By: Juline Patch Transaction Date/Time: 10/12/2016 5:44:25 PM (ET)

## 2016-10-15 NOTE — Telephone Encounter (Signed)
I spoke wth pt; Pt already has appt scheduled/

## 2016-10-17 ENCOUNTER — Encounter: Payer: Self-pay | Admitting: Internal Medicine

## 2016-10-17 ENCOUNTER — Ambulatory Visit (INDEPENDENT_AMBULATORY_CARE_PROVIDER_SITE_OTHER): Payer: Medicare HMO | Admitting: Internal Medicine

## 2016-10-17 VITALS — BP 140/84 | HR 75 | Temp 98.1°F | Ht 60.5 in | Wt 123.0 lb

## 2016-10-17 DIAGNOSIS — K257 Chronic gastric ulcer without hemorrhage or perforation: Secondary | ICD-10-CM

## 2016-10-17 DIAGNOSIS — Z0000 Encounter for general adult medical examination without abnormal findings: Secondary | ICD-10-CM

## 2016-10-17 DIAGNOSIS — F39 Unspecified mood [affective] disorder: Secondary | ICD-10-CM | POA: Diagnosis not present

## 2016-10-17 DIAGNOSIS — M8949 Other hypertrophic osteoarthropathy, multiple sites: Secondary | ICD-10-CM

## 2016-10-17 DIAGNOSIS — Z7189 Other specified counseling: Secondary | ICD-10-CM

## 2016-10-17 DIAGNOSIS — I10 Essential (primary) hypertension: Secondary | ICD-10-CM | POA: Diagnosis not present

## 2016-10-17 DIAGNOSIS — M159 Polyosteoarthritis, unspecified: Secondary | ICD-10-CM

## 2016-10-17 DIAGNOSIS — M150 Primary generalized (osteo)arthritis: Secondary | ICD-10-CM

## 2016-10-17 DIAGNOSIS — G8929 Other chronic pain: Secondary | ICD-10-CM | POA: Diagnosis not present

## 2016-10-17 DIAGNOSIS — R69 Illness, unspecified: Secondary | ICD-10-CM | POA: Diagnosis not present

## 2016-10-17 LAB — COMPREHENSIVE METABOLIC PANEL
ALBUMIN: 4.7 g/dL (ref 3.5–5.2)
ALK PHOS: 56 U/L (ref 39–117)
ALT: 9 U/L (ref 0–35)
AST: 12 U/L (ref 0–37)
BUN: 18 mg/dL (ref 6–23)
CALCIUM: 10.3 mg/dL (ref 8.4–10.5)
CHLORIDE: 97 meq/L (ref 96–112)
CO2: 30 mEq/L (ref 19–32)
CREATININE: 0.97 mg/dL (ref 0.40–1.20)
GFR: 59.16 mL/min — ABNORMAL LOW (ref >60.00)
Glucose, Bld: 94 mg/dL (ref 70–99)
POTASSIUM: 3.8 meq/L (ref 3.5–5.1)
Sodium: 133 mEq/L — ABNORMAL LOW (ref 135–145)
TOTAL PROTEIN: 7.6 g/dL (ref 6.0–8.3)
Total Bilirubin: 0.8 mg/dL (ref 0.2–1.2)

## 2016-10-17 LAB — CBC WITH DIFFERENTIAL/PLATELET
BASOS PCT: 0.8 % (ref 0.0–3.0)
Basophils Absolute: 0.1 10*3/uL (ref 0.0–0.1)
EOS ABS: 0.3 10*3/uL (ref 0.0–0.7)
Eosinophils Relative: 3.4 % (ref 0.0–5.0)
HEMATOCRIT: 44.9 % (ref 36.0–46.0)
HEMOGLOBIN: 15.2 g/dL — AB (ref 12.0–15.0)
LYMPHS PCT: 20.1 % (ref 12.0–46.0)
Lymphs Abs: 1.7 10*3/uL (ref 0.7–4.0)
MCHC: 33.9 g/dL (ref 30.0–36.0)
MCV: 93.5 fl (ref 78.0–100.0)
MONO ABS: 0.6 10*3/uL (ref 0.1–1.0)
Monocytes Relative: 7.3 % (ref 3.0–12.0)
Neutro Abs: 5.7 10*3/uL (ref 1.4–7.7)
Neutrophils Relative %: 68.4 % (ref 43.0–77.0)
Platelets: 266 10*3/uL (ref 150.0–400.0)
RBC: 4.81 Mil/uL (ref 3.87–5.11)
RDW: 12.4 % (ref 11.5–15.5)
WBC: 8.3 10*3/uL (ref 4.0–10.5)

## 2016-10-17 LAB — LIPID PANEL
CHOLESTEROL: 226 mg/dL — AB (ref 0–200)
HDL: 84.8 mg/dL (ref >39.00)
LDL Cholesterol: 125 mg/dL — ABNORMAL HIGH (ref 0–99)
NonHDL: 141.68
TRIGLYCERIDES: 81 mg/dL (ref 0.0–149.0)
Total CHOL/HDL Ratio: 3
VLDL: 16.2 mg/dL (ref 0.0–40.0)

## 2016-10-17 LAB — T4, FREE: Free T4: 0.72 ng/dL (ref 0.60–1.60)

## 2016-10-17 NOTE — Assessment & Plan Note (Signed)
BP Readings from Last 3 Encounters:  10/17/16 140/84  10/03/16 120/70  10/01/16 133/90   Reasonable control

## 2016-10-17 NOTE — Assessment & Plan Note (Signed)
I have personally reviewed the Medicare Annual Wellness questionnaire and have noted 1. The patient's medical and social history 2. Their use of alcohol, tobacco or illicit drugs 3. Their current medications and supplements 4. The patient's functional ability including ADL's, fall risks, home safety risks and hearing or visual             impairment. 5. Diet and physical activities 6. Evidence for depression or mood disorders  The patients weight, height, BMI and visual acuity have been recorded in the chart I have made referrals, counseling and provided education to the patient based review of the above and I have provided the pt with a written personalized care plan for preventive services.  I have provided you with a copy of your personalized plan for preventive services. Please take the time to review along with your updated medication list.  Had normal colon around 5 years ago--done with this Due for mammogram--then will get one more in 2 years Discussed exercise Yearly flu vaccine--otherwise UTD

## 2016-10-17 NOTE — Assessment & Plan Note (Signed)
Chronic dysthymia and anxiety Just uses the diazepam at bedtime

## 2016-10-17 NOTE — Progress Notes (Signed)
Subjective:    Patient ID: Katie Bell, female    DOB: 02/18/1941, 76 y.o.   MRN: [id number]  HPI Here for Medicare wellness and follow up of chronic health conditions Reviewed form and advanced directives Reviewed other doctors-- Dr Erlene Quan (urology), [place], dentist (name?), Dr Kennon Rounds (gyn) No alcohol or tobacco Tries to walk occasionally No trouble with vision or hearing [place] in garage and hurt knees (now put up rail to hold onto) Had ureteroscopy with laser lithotripsy--- 2 separate procedures Independent with instrumental ADLs No sig memory problems  Urine symptoms are better now Sinus infection seems cleared Knee injections have helped arthritis Uses tramadol for this--prn only (not every day). Also uses for headaches  Ongoing chronic anxiety Takes the valium daily at bedtime--never in daytime Chronic dysthymia--not daily, but regular depression. Not anhedonic Son lives with her--but no financial help  No chest pain No palpitations Gets dizziness at times--- no syncope. Not sure why No edema No SOB  Occasional epigastric pain Still takes the sucralfate--but inconsistent Takes aloe vera daily  Current Outpatient Prescriptions on File Prior to Visit  Medication Sig Dispense Refill  . Cholecalciferol (VITAMIN D) 400 UNITS capsule Take 800 Units by mouth daily.    . diazepam (VALIUM) 10 MG tablet TAKE 1/2 TABLET BY MOUTH 2 TIMES DAILY AS NEEDED FOR ANXIETY OR SLEEP 30 tablet 0  . fluticasone (FLONASE) 50 MCG/ACT nasal spray Place 1 spray into both nostrils 2 (two) times daily. 16 g 1  . losartan-hydrochlorothiazide (HYZAAR) 50-12.5 MG tablet TAKE 1 TABLET BY MOUTH ONCE DAILY 90 tablet 3  . Omega-3 Fatty Acids (FISH OIL) 1000 MG CAPS Take 1,000 mg by mouth daily.     . sucralfate (CARAFATE) 1 g tablet Take 1 tablet (1 g total) by mouth 4 (four) times daily -  with meals and at bedtime. 120 tablet 11  . traMADol (ULTRAM) 50 MG tablet TAKE 1 TABLET BY MOUTH 2  TIMES A DAY AS NEEDED FOR PAIN 30 tablet 0  . vitamin C (ASCORBIC ACID) 500 MG tablet Take 500 mg by mouth daily.     No current facility-administered medications on file prior to visit.     Allergies  Allergen Reactions  . Morphine     agitation  . Morphine And Related Anxiety    Past Medical History:  Diagnosis Date  . Anxiety   . Cancer ([place]) 2015  . Depression   . Episodic mood disorder ([place])    mostly anxiety--depression in the past  . Gastric ulcer    History  . History of blood transfusion    At [facility] appro 15 yrs ago  . History of kidney stones 1960  . Hypertension   . Osteoarthritis, hand    hand  . Postmenopausal disorder   . SVD (spontaneous vaginal delivery)    x 3    Past Surgical History:  Procedure Laterality Date  . CHOLECYSTECTOMY  2008  . COLONOSCOPY  04/2011  . CYSTOCELE REPAIR N/A 12/31/2013   Procedure: ANTERIOR REPAIR (CYSTOCELE);  Surgeon: Donnamae Jude, MD;  Location: [facility];  Service: Gynecology;  Laterality: N/A;  . CYSTOSCOPY W/ URETERAL STENT PLACEMENT Left 01/16/2016   Procedure: CYSTOSCOPY WITH STENT REPLACEMENT;  Surgeon: Hollice Espy, MD;  Location: [facility];  Service: Urology;  Laterality: Left;  . CYSTOSCOPY WITH STENT PLACEMENT Left 12/23/2015   Procedure: CYSTOSCOPY WITH STENT PLACEMENT;  Surgeon: Ardis Hughs, MD;  Location: [facility];  Service: Urology;  Laterality: Left;  . DIAGNOSTIC LAPAROSCOPY    . kidney stone removed    . MELANOMA EXCISION  2015   left posterior thigh  . TONSILLECTOMY  1944  . TUBAL LIGATION    . UPPER GI ENDOSCOPY    . URETEROSCOPY WITH HOLMIUM LASER LITHOTRIPSY Left 01/16/2016   Procedure: URETEROSCOPY WITH HOLMIUM LASER LITHOTRIPSY;  Surgeon: Hollice Espy, MD;  Location: [facility];  Service: Urology;  Laterality: Left;  . WISDOM TOOTH EXTRACTION      Family History  Problem Relation Age of Onset  . Alzheimer's disease Mother   . Alcohol abuse Father   . COPD Brother   .  Heart disease Neg Hx   . Cancer Neg Hx     Social History   Social History  . Marital status: Divorced    Spouse name: N/A  . Number of children: 3  . Years of education: N/A   Occupational History  . LPN--retired     personal care on the side   Social History Main Topics  . Smoking status: Never Smoker  . Smokeless tobacco: Never Used  . Alcohol use No  . Drug use: No  . Sexual activity: Yes    Birth control/ protection: Post-menopausal   Other Topics Concern  . Not on file   Social History Narrative   3 sons--- middle one lives with her   Goes by Katie Bell      No living will   Son Katie Bell should make decisions for her. Then son Katie Bell   Would accept resuscitation attempts but no prolonged ventilation   No tube feeds if cognitively unaware         Review of Systems Appetite is okay Weight is stable Sleep is still not great--even with valium (freq awakening) Wears seat belt Teeth are fine--keeps up with dentist Bowels are good. No blood No skin rashes or ulcers Some heartburn--relates to ulcer. No dysphagia     Objective:   Physical Exam  Constitutional: She is oriented to person, place, and time. She appears well-nourished. No distress.  HENT:  Mouth/Throat: Oropharynx is clear and moist. No oropharyngeal exudate.  Neck: No thyromegaly present.  Cardiovascular: Normal rate, regular rhythm, normal heart sounds and intact distal pulses.  Exam reveals no gallop.   No murmur heard. Pulmonary/Chest: Effort normal and breath sounds normal. No respiratory distress. She has no wheezes. She has no rales.  Abdominal: Soft. She exhibits no distension. There is no rebound and no guarding.  Slight epigastric tenderness  Musculoskeletal: She exhibits no edema or tenderness.  Lymphadenopathy:    She has no cervical adenopathy.  Neurological: She is alert and oriented to person, place, and time.  President-- "[employer]" [phone] [id number] Recall 3/3  Skin:  No rash noted. No erythema.  Psychiatric: She has a normal mood and affect. Her behavior is normal.          Assessment & Plan:

## 2016-10-17 NOTE — Assessment & Plan Note (Signed)
See social history  °Gave blank forms °

## 2016-10-17 NOTE — Assessment & Plan Note (Signed)
Mild tenderness in epigastrium chronically Urged her to use the carafate more regularly

## 2016-10-17 NOTE — Assessment & Plan Note (Signed)
Uses the tramadol occasionally Checked CSRS--only from here other than urologists almost a year ago

## 2016-10-22 ENCOUNTER — Other Ambulatory Visit: Payer: Self-pay | Admitting: Internal Medicine

## 2016-10-23 LAB — TOXASSURE SELECT 13 (MW), URINE

## 2016-10-26 ENCOUNTER — Encounter: Payer: Self-pay | Admitting: *Deleted

## 2016-10-29 ENCOUNTER — Other Ambulatory Visit: Payer: Self-pay | Admitting: Internal Medicine

## 2016-10-30 NOTE — Telephone Encounter (Signed)
Approved: 30 x 0 

## 2016-10-30 NOTE — Telephone Encounter (Signed)
Left refill on voice mail at pharmacy  

## 2016-10-30 NOTE — Telephone Encounter (Signed)
Last filled 09-18-16 #30 Last OV/CPE 10-17-16 No Future OV

## 2016-11-02 ENCOUNTER — Ambulatory Visit (INDEPENDENT_AMBULATORY_CARE_PROVIDER_SITE_OTHER): Payer: Medicare HMO | Admitting: Family Medicine

## 2016-11-02 ENCOUNTER — Encounter: Payer: Self-pay | Admitting: Family Medicine

## 2016-11-02 VITALS — BP 152/78 | HR 69 | Wt 124.8 lb

## 2016-11-02 DIAGNOSIS — N8110 Cystocele, unspecified: Secondary | ICD-10-CM | POA: Diagnosis not present

## 2016-11-02 NOTE — Patient Instructions (Signed)
Cystocele Repair Cystocele repair is surgery to fix a cystocele, which is a bulging, drooping area (hernia) of the bladder that extends into the vagina. This bulging occurs on the top front (anterior) wall of the vagina. The condition may also be called an anterior prolapse or a prolapsed bladder. Tell a health care provider about:  Any allergies you have.  All medicines you are taking, including vitamins, herbs, eye drops, creams, and over-the-counter medicines.  Any problems you or family members have had with anesthetic medicines.  Any blood disorders you have.  Any surgeries you have had.  Any medical conditions you have.  Whether you are pregnant or may be pregnant. What are the risks? Generally, this is a safe procedure. However, problems may occur, including:  Infection.  Too much bleeding.  Allergic reactions to medicines.  Damage to other structures or organs.  Problems with the urinary drainage tube (catheter), such as blockage.  Return of the cystocele.  Problems with the vaginal mesh.  What happens before the procedure? Staying hydrated Follow instructions from your health care provider about hydration, which may include:  Up to 2 hours before the procedure - you may continue to drink clear liquids, such as water, clear fruit juice, black coffee, and plain tea.  Eating and drinking restrictions Follow instructions from your health care provider about eating and drinking, which may include:  8 hours before the procedure - stop eating heavy meals or foods such as meat, fried foods, or fatty foods.  6 hours before the procedure - stop eating light meals or foods, such as toast or cereal.  6 hours before the procedure - stop drinking milk or drinks that contain milk.  2 hours before the procedure - stop drinking clear liquids.  Medicines  Ask your health care provider about: ? Changing or stopping your regular medicines. This is especially important if  you are taking diabetes medicines or blood thinners. ? Taking medicines such as aspirin and ibuprofen. These medicines can thin your blood. Do not take these medicines before your procedure if your health care provider instructs you not to.  You may be given antibiotic medicine to help prevent infection. General instructions  Do not use any products that contain nicotine or tobacco-such as cigarettes and e-cigarettes-for at least 2 weeks before the procedure. If you need help quitting, ask your health care provider.  Do not drink any alcohol for 3 days before the surgery.  Plan to have someone take you home from the hospital or clinic.  Ask your health care provider how your surgical site will be marked or identified. What happens during the procedure?  To reduce your risk of infection: ? Your health care team will wash or sanitize their hands. ? Your skin will be washed with soap.  You will be given one or more of the following: ? A medicine to make you fall asleep (general anesthetic). ? A medicine that is injected into your spine to numb the area below and slightly above the injection site (spinal anesthetic). ? A medicine that is injected into an area of your body to numb everything below the injection site (regional anesthetic).  A thin, flexible tube (indwelling urinary catheter) will be placed in your bladder to drain urine during and after the surgery.  The surgery will be performed through the vagina. An incision will be made in the front wall of the vagina.  The muscle between the bladder and vagina will be pulled up to its normal   position. Stitches (sutures) or a piece of mesh will be used to support the muscle and hold it in place. This will remove the hernia so the top of the bladder does not fall into the opening of the vagina.  The incision on the front wall of the vagina will then be closed with stitches that will dissolve safely into your body and do not need to be  removed. The procedure may vary among health care providers and hospitals. What happens after the procedure?  Your blood pressure, heart rate, breathing rate, and blood oxygen level will be monitored until the medicines you were given have worn off.  You will have a catheter in place to drain your bladder. This will stay in place for 2-7 days or until your bladder is working well on its own.  You may have gauze packing in the vagina. This will be removed 1-2 days after the surgery.  You will be given pain medicine as needed.  You may be given antibiotics to fight infection. This information is not intended to replace advice given to you by your health care provider. Make sure you discuss any questions you have with your health care provider. Document Released: 04/06/2000 Document Revised: 11/11/2015 Document Reviewed: 06/30/2015 Elsevier Interactive Patient Education  2018 Elsevier Inc.   

## 2016-11-02 NOTE — Assessment & Plan Note (Signed)
Again advised no surgery, but she has referral and will see them in June.

## 2016-11-02 NOTE — Progress Notes (Signed)
   Subjective:    Patient ID: Katie Bell is a 76 y.o. female presenting with Follow-up (Recheck cystocele)  on 11/02/2016  HPI: Here today to f/u her small cystocele. She does not think that it has gotten worse.  Review of Systems  Constitutional: Negative for chills and fever.  Respiratory: Negative for shortness of breath.   Cardiovascular: Negative for chest pain.  Gastrointestinal: Negative for abdominal pain, nausea and vomiting.  Genitourinary: Negative for dysuria.  Skin: Negative for rash.      Objective:    BP (!) 152/78   Pulse 69   Wt 124 lb 12.8 oz (56.6 kg)   SpO2 96%   BMI 23.97 kg/m  Physical Exam  Constitutional: She is oriented to person, place, and time. She appears well-developed and well-nourished. No distress.  HENT:  Head: Normocephalic and atraumatic.  Eyes: No scleral icterus.  Neck: Neck supple.  Cardiovascular: Normal rate.   Pulmonary/Chest: Effort normal.  Abdominal: Soft.  Genitourinary:  Genitourinary Comments: Small cystocele noted while standing and with valsalva  Neurological: She is alert and oriented to person, place, and time.  Skin: Skin is warm and dry.  Psychiatric: She has a normal mood and affect.      Assessment & Plan:   Problem List Items Addressed This Visit      Unprioritized   Female cystocele - Primary    Again advised no surgery, but she has referral and will see them in June.         Total face-to-face time with patient: 10 minutes. Over 50% of encounter was spent on counseling and coordination of care. Return if symptoms worsen or fail to improve.  Donnamae Jude 11/02/2016 10:33 AM

## 2016-11-10 DIAGNOSIS — E559 Vitamin D deficiency, unspecified: Secondary | ICD-10-CM | POA: Diagnosis not present

## 2016-11-10 DIAGNOSIS — R69 Illness, unspecified: Secondary | ICD-10-CM | POA: Diagnosis not present

## 2016-11-10 DIAGNOSIS — G4700 Insomnia, unspecified: Secondary | ICD-10-CM | POA: Diagnosis not present

## 2016-11-10 DIAGNOSIS — Z6824 Body mass index (BMI) 24.0-24.9, adult: Secondary | ICD-10-CM | POA: Diagnosis not present

## 2016-11-10 DIAGNOSIS — Z0000 Encounter for general adult medical examination without abnormal findings: Secondary | ICD-10-CM | POA: Diagnosis not present

## 2016-11-10 DIAGNOSIS — M1389 Other specified arthritis, multiple sites: Secondary | ICD-10-CM | POA: Diagnosis not present

## 2016-11-10 DIAGNOSIS — J309 Allergic rhinitis, unspecified: Secondary | ICD-10-CM | POA: Diagnosis not present

## 2016-11-10 DIAGNOSIS — K210 Gastro-esophageal reflux disease with esophagitis: Secondary | ICD-10-CM | POA: Diagnosis not present

## 2016-11-10 DIAGNOSIS — I10 Essential (primary) hypertension: Secondary | ICD-10-CM | POA: Diagnosis not present

## 2016-11-10 DIAGNOSIS — Z9181 History of falling: Secondary | ICD-10-CM | POA: Diagnosis not present

## 2016-11-15 ENCOUNTER — Telehealth: Payer: Self-pay

## 2016-11-15 NOTE — Telephone Encounter (Signed)
Pt left v/m; pt was seen med wellness on 10/17/16. Pt is not happy with cholesterol readings and pt request a low dose cholesterol med. [facility]. Per 10/17/16 lab results Dr Silvio Pate noted cholesterol remains mildly elevated but not worrisome. Please advise.pt request cb.

## 2016-11-16 NOTE — Telephone Encounter (Signed)
Need to defer to PCP, especially since she has high HDL.  Routed to PCP. Thanks.

## 2016-11-18 NOTE — Telephone Encounter (Signed)
Make sure she realizes that she has a low risk profile If she really wants to start med, can send Rx for atorvastatin 10mg  daily Set up blood work for 6 weeks if she starts

## 2016-11-19 NOTE — Telephone Encounter (Signed)
Spoke to pt. She is just worried because she has a cousin who was told her blood "is thick" and was put on medication. I explained that was a vague description and we could not elaborate on it. Also, she is concerned about a stroke because she has a neighbor who seemed healthy on the outside but had a stroke. I explained that her good numbers really help combat the slightly elevated numbers she had with her cholesterol. She kept asking what she thought I should do. I advised her that Dr Silvio Pate was leaving the decision up to her. She will hold off on taking the atorvastatin right now.

## 2016-11-27 ENCOUNTER — Other Ambulatory Visit: Payer: Self-pay | Admitting: Internal Medicine

## 2016-11-27 ENCOUNTER — Telehealth: Payer: Self-pay | Admitting: Internal Medicine

## 2016-11-27 NOTE — Telephone Encounter (Signed)
Pt has appt on 11/28/16 at 11:15 with Dr Silvio Pate.

## 2016-11-27 NOTE — Telephone Encounter (Signed)
Patient Name: Katie Bell DOB: 07/05/1940 Initial Comment Caller states having bad migraine headaches Nurse Assessment Nurse: Joline Salt, RN, Malachy Mood Date/Time (Eastern Time): 11/27/2016 1:29:11 PM Confirm and document reason for call. If symptomatic, describe symptoms. ---Caller states having bad migraine headaches. She states she took Tylenol but it didn't help. Then took Advil but she has ulcers. She took nerve medicine and now she can't drive. It starts in the back of her neck and goes to her head. This has been going on for a little over a week. It will start to go away and then come back. Unable to sleep at night. Does the patient have any new or worsening symptoms? ---Yes Will a triage be completed? ---Yes Related visit to physician within the last 2 weeks? ---No Does the PT have any chronic conditions? (i.e. diabetes, asthma, etc.) ---Yes List chronic conditions. ---sinuses. and migraines. Is this a behavioral health or substance abuse call? ---No Guidelines Guideline Title Affirmed Question Affirmed Notes Headache [1] MODERATE headache (e.g., interferes with normal activities) AND [2] present > 24 hours AND [3] unexplained (Exceptions: analgesics not tried, typical migraine, or headache part of viral illness) Final Disposition User See Physician within 24 Hours [place], RN, [place] Comments Appt scheduled for tomorrow 8/8 at 11:15 with Dr. Silvio Pate Referrals REFERRED TO PCP OFFICE Disagree/Comply: Comply

## 2016-11-27 NOTE — Telephone Encounter (Signed)
Last filled 10-30-16 #30 Lat OV 10-17-16 Next OV tomorrow 11-28-16

## 2016-11-28 ENCOUNTER — Ambulatory Visit (INDEPENDENT_AMBULATORY_CARE_PROVIDER_SITE_OTHER): Payer: Medicare HMO | Admitting: Internal Medicine

## 2016-11-28 ENCOUNTER — Encounter: Payer: Self-pay | Admitting: Internal Medicine

## 2016-11-28 VITALS — BP 132/88 | HR 67 | Temp 97.7°F | Wt 124.0 lb

## 2016-11-28 DIAGNOSIS — G44209 Tension-type headache, unspecified, not intractable: Secondary | ICD-10-CM | POA: Diagnosis not present

## 2016-11-28 DIAGNOSIS — F39 Unspecified mood [affective] disorder: Secondary | ICD-10-CM | POA: Diagnosis not present

## 2016-11-28 DIAGNOSIS — R69 Illness, unspecified: Secondary | ICD-10-CM | POA: Diagnosis not present

## 2016-11-28 MED ORDER — SUCRALFATE 1 G PO TABS: ORAL_TABLET | ORAL | 11 refills | Status: DC

## 2016-11-28 MED ORDER — TIZANIDINE HCL 2 MG PO TABS: 2.0000 mg | ORAL_TABLET | Freq: Three times a day (TID) | ORAL | 1 refills | Status: DC | PRN

## 2016-11-28 NOTE — Telephone Encounter (Signed)
Approved: 30 x 0 

## 2016-11-28 NOTE — Assessment & Plan Note (Signed)
Not migraine--discussed this with her Seems to be related to stress-mostly financial Discussed heat or cool compresses Muscle relaxer Reassured--not serious

## 2016-11-28 NOTE — Assessment & Plan Note (Signed)
Really stressed with financial issues Doesn't want to kick son out--doesn't want to be alone. But he doesn't contribute Owes a lot on house No will---can't afford Gave info about Legal Aid and financial counseling

## 2016-11-28 NOTE — Telephone Encounter (Signed)
Will evaluate at the OV 

## 2016-11-28 NOTE — Telephone Encounter (Signed)
Left refill on voice mail at pharmacy  

## 2016-11-28 NOTE — Progress Notes (Signed)
Subjective:    Patient ID: Katie Bell, female    DOB: December 15, 1940, 76 y.o.   MRN: [id number]  HPI Here due to headache  This is a new --starts in neck and moves up her head Started about 2 weeks ago but getting worse No recent injury or travel  Worse when moving head Cool rag and lying in dark helps Some photophobia but not really sonophobia Some worsening if moving around  No history of migraines but she is calling it migraine No aura or vision changes Lots of stress--financial Has tried tylenol (brief help) and some diazepam this AM (tiny bit--no clear help) Diazepam 5mg  last night did help  Current Outpatient Prescriptions on File Prior to Visit  Medication Sig Dispense Refill  . Cholecalciferol (VITAMIN D) 400 UNITS capsule Take 800 Units by mouth daily.    . diazepam (VALIUM) 10 MG tablet TAKE 1/2 TABLET BY MOUTH TWICE A DAY AS NEEDED FOR ANXIETY OR SLEEP 30 tablet 0  . fluticasone (FLONASE) 50 MCG/ACT nasal spray Place 1 spray into both nostrils 2 (two) times daily. 16 g 1  . losartan-hydrochlorothiazide (HYZAAR) 50-12.5 MG tablet TAKE 1 TABLET BY MOUTH ONCE DAILY 90 tablet 3  . Omega-3 Fatty Acids (FISH OIL) 1000 MG CAPS Take 1,000 mg by mouth daily.     . sucralfate (CARAFATE) 1 g tablet TAKE 1 TABLET BY MOUTH 4 TIMES DAILY WITH MEALS AND AT BEDTIME 120 tablet 11  . vitamin C (ASCORBIC ACID) 500 MG tablet Take 500 mg by mouth daily.     No current facility-administered medications on file prior to visit.     Allergies  Allergen Reactions  . Morphine     agitation  . Morphine And Related Anxiety    Past Medical History:  Diagnosis Date  . Anxiety   . Cancer ([place]) 2015  . Depression   . Episodic mood disorder ([place])    mostly anxiety--depression in the past  . Gastric ulcer    History  . History of blood transfusion    At [facility] appro 15 yrs ago  . History of kidney stones 1960  . Hypertension   . Osteoarthritis, hand    hand  .  Postmenopausal disorder   . SVD (spontaneous vaginal delivery)    x 3    Past Surgical History:  Procedure Laterality Date  . CHOLECYSTECTOMY  2008  . COLONOSCOPY  04/2011  . CYSTOCELE REPAIR N/A 12/31/2013   Procedure: ANTERIOR REPAIR (CYSTOCELE);  Surgeon: Donnamae Jude, MD;  Location: [facility];  Service: Gynecology;  Laterality: N/A;  . CYSTOSCOPY W/ URETERAL STENT PLACEMENT Left 01/16/2016   Procedure: CYSTOSCOPY WITH STENT REPLACEMENT;  Surgeon: Hollice Espy, MD;  Location: [facility];  Service: Urology;  Laterality: Left;  . CYSTOSCOPY WITH STENT PLACEMENT Left 12/23/2015   Procedure: CYSTOSCOPY WITH STENT PLACEMENT;  Surgeon: Ardis Hughs, MD;  Location: [facility];  Service: Urology;  Laterality: Left;  . DIAGNOSTIC LAPAROSCOPY    . kidney stone removed    . MELANOMA EXCISION  2015   left posterior thigh  . TONSILLECTOMY  1944  . TUBAL LIGATION    . UPPER GI ENDOSCOPY    . URETEROSCOPY WITH HOLMIUM LASER LITHOTRIPSY Left 01/16/2016   Procedure: URETEROSCOPY WITH HOLMIUM LASER LITHOTRIPSY;  Surgeon: Hollice Espy, MD;  Location: [facility];  Service: Urology;  Laterality: Left;  . WISDOM TOOTH EXTRACTION      Family History  Problem Relation Age of Onset  .  Alzheimer's disease Mother   . Alcohol abuse Father   . COPD Brother   . Heart disease Neg Hx   . Cancer Neg Hx     Social History   Social History  . Marital status: Divorced    Spouse name: N/A  . Number of children: 3  . Years of education: N/A   Occupational History  . LPN--retired     personal care on the side   Social History Main Topics  . Smoking status: Never Smoker  . Smokeless tobacco: Never Used  . Alcohol use No  . Drug use: No  . Sexual activity: Yes    Birth control/ protection: Post-menopausal   Other Topics Concern  . Not on file   Social History Narrative   3 sons--- middle one lives with her   [name]      No living will   Son [name]. Then son  [name]   Would accept resuscitation attempts but no prolonged ventilation   No tube feeds if cognitively unaware         Review of Systems  No nausea or vomiting Some stomach pain from ulcer--- continues on the carafate Appetite is okay Bladder prolapse has come back--she is worried about the further evaluation No longer working part time     Objective:   Physical Exam  Constitutional: She is oriented to person, place, and time.  Mildly uncomfortable--better after exam  HENT:  Mouth/Throat: Oropharynx is clear and moist. No oropharyngeal exudate.  Eyes: Pupils are equal, round, and reactive to light. Conjunctivae and EOM are normal.  Neck: No thyromegaly present.  Tension and spasm in trapezius muscles and cervical paraspinals Fairly good ROM though  Lymphadenopathy:    She has no cervical adenopathy.  Neurological: She is oriented to person, place, and time. She has normal strength. No cranial nerve deficit. She exhibits normal muscle tone. She displays a negative Romberg sign. Coordination and gait normal.  Psychiatric:  Tearful discussing her financial problems Clearly some depression--but reactive with her discussion of issues          Assessment & Plan:

## 2016-12-12 DIAGNOSIS — R69 Illness, unspecified: Secondary | ICD-10-CM | POA: Diagnosis not present

## 2016-12-12 DIAGNOSIS — N8110 Cystocele, unspecified: Secondary | ICD-10-CM | POA: Diagnosis not present

## 2016-12-12 DIAGNOSIS — N9489 Other specified conditions associated with female genital organs and menstrual cycle: Secondary | ICD-10-CM | POA: Diagnosis not present

## 2016-12-12 DIAGNOSIS — R8290 Unspecified abnormal findings in urine: Secondary | ICD-10-CM | POA: Diagnosis not present

## 2016-12-12 DIAGNOSIS — N393 Stress incontinence (female) (male): Secondary | ICD-10-CM | POA: Diagnosis not present

## 2016-12-12 DIAGNOSIS — R339 Retention of urine, unspecified: Secondary | ICD-10-CM | POA: Diagnosis not present

## 2017-01-01 ENCOUNTER — Other Ambulatory Visit: Payer: Self-pay | Admitting: Internal Medicine

## 2017-01-02 NOTE — Telephone Encounter (Signed)
Approved: 30 x 0 

## 2017-01-02 NOTE — Telephone Encounter (Signed)
Last filled 11-28-16 #30 Last OV 11-28-16 No Future OV

## 2017-01-02 NOTE — Telephone Encounter (Signed)
Left refill on voice mail at pharmacy  

## 2017-01-08 ENCOUNTER — Encounter: Payer: Self-pay | Admitting: *Deleted

## 2017-01-30 DIAGNOSIS — R69 Illness, unspecified: Secondary | ICD-10-CM | POA: Diagnosis not present

## 2017-02-05 DIAGNOSIS — R69 Illness, unspecified: Secondary | ICD-10-CM | POA: Diagnosis not present

## 2017-02-12 ENCOUNTER — Other Ambulatory Visit: Payer: Self-pay | Admitting: Internal Medicine

## 2017-02-13 NOTE — Telephone Encounter (Signed)
Approved: 30 x 0 

## 2017-02-13 NOTE — Telephone Encounter (Signed)
Last filled 9-12-1 #30 Last Ov 11-28-16 No Future OV

## 2017-02-13 NOTE — Telephone Encounter (Signed)
Left refill on voice mail at pharmacy  

## 2017-02-20 ENCOUNTER — Encounter: Payer: Self-pay | Admitting: Family Medicine

## 2017-02-20 ENCOUNTER — Ambulatory Visit (INDEPENDENT_AMBULATORY_CARE_PROVIDER_SITE_OTHER): Payer: Medicare HMO | Admitting: Family Medicine

## 2017-02-20 VITALS — BP 130/80 | HR 76 | Temp 97.6°F | Wt 122.0 lb

## 2017-02-20 DIAGNOSIS — Z23 Encounter for immunization: Secondary | ICD-10-CM

## 2017-02-20 DIAGNOSIS — R300 Dysuria: Secondary | ICD-10-CM | POA: Diagnosis not present

## 2017-02-20 DIAGNOSIS — H00012 Hordeolum externum right lower eyelid: Secondary | ICD-10-CM

## 2017-02-20 DIAGNOSIS — N3090 Cystitis, unspecified without hematuria: Secondary | ICD-10-CM | POA: Diagnosis not present

## 2017-02-20 LAB — POCT URINALYSIS DIPSTICK
BILIRUBIN UA: NEGATIVE
Blood, UA: NEGATIVE
Glucose, UA: NEGATIVE
Ketones, UA: NEGATIVE
NITRITE UA: POSITIVE
Protein, UA: NEGATIVE
Spec Grav, UA: 1.015 (ref 1.010–1.025)
Urobilinogen, UA: 0.2 E.U./dL
pH, UA: 6 (ref 5.0–8.0)

## 2017-02-20 MED ORDER — ZOSTER VAC RECOMB ADJUVANTED 50 MCG/0.5ML IM SUSR: 0.5000 mL | Freq: Once | INTRAMUSCULAR | 0 refills | Status: AC

## 2017-02-20 MED ORDER — CEPHALEXIN 500 MG PO CAPS: 500.0000 mg | ORAL_CAPSULE | Freq: Two times a day (BID) | ORAL | 0 refills | Status: DC

## 2017-02-20 NOTE — Patient Instructions (Addendum)
Please apply warm compress to your eye 4-6 times per day  Stye A stye is a bump on your eyelid caused by a bacterial infection. A stye can form inside the eyelid (internal stye) or outside the eyelid (external stye). An internal stye may be caused by an infected oil-producing gland inside your eyelid. An external stye may be caused by an infection at the base of your eyelash (hair follicle). Styes are very common. Anyone can get them at any age. They usually occur in just one eye, but you may have more than one in either eye. What are the causes? The infection is almost always caused by bacteria called Staphylococcus aureus. This is a common type of bacteria that lives on your skin. What increases the risk? You may be at higher risk for a stye if you have had one before. You may also be at higher risk if you have:  Diabetes.  Long-term illness.  Long-term eye redness.  A skin condition called seborrhea.  High fat levels in your blood (lipids).  What are the signs or symptoms? Eyelid pain is the most common symptom of a stye. Internal styes are more painful than external styes. Other signs and symptoms may include:  Painful swelling of your eyelid.  A scratchy feeling in your eye.  Tearing and redness of your eye.  Pus draining from the stye.  How is this diagnosed? Your health care provider may be able to diagnose a stye just by examining your eye. The health care provider may also check to make sure:  You do not have a fever or other signs of a more serious infection.  The infection has not spread to other parts of your eye or areas around your eye.  How is this treated? Most styes will clear up in a few days without treatment. In some cases, you may need to use antibiotic drops or ointment to prevent infection. Your health care provider may have to drain the stye surgically if your stye is:  Large.  Causing a lot of pain.  Interfering with your vision.  This can be  done using a thin blade or a needle. Follow these instructions at home:  Take medicines only as directed by your health care provider.  Apply a clean, warm compress to your eye for 10 minutes, 4 times a day.  Do not wear contact lenses or eye makeup until your stye has healed.  Do not try to pop or drain the stye. Contact a health care provider if:  You have chills or a fever.  Your stye does not go away after several days.  Your stye affects your vision.  Your eyeball becomes swollen, red, or painful. This information is not intended to replace advice given to you by your health care provider. Make sure you discuss any questions you have with your health care provider. Document Released: 01/17/2005 Document Revised: 12/04/2015 Document Reviewed: 07/24/2013 Elsevier Interactive Patient Education  [employer].

## 2017-02-20 NOTE — Progress Notes (Signed)
Subjective:    Patient ID: Katie Bell, female    DOB: 05-12-1940, 76 y.o.   MRN: [id number]  HPI This is a 76 yo female who presents today with right eye pain and swelling for several days. No blurred or double vision. Nothing like this before. Has had some recent runny nose, no sore throat or ear pain. Has applied Vaseline to area without relief.   Has been having intermittent dysuria for 2-3 weeks. No nausea/vomiting or back pain. No hematuria. Not unusual for her to get UTI.   She also requests Shingrix vaccine prescription.    Past Medical History:  Diagnosis Date  . Anxiety   . Cancer ([place]) 2015  . Depression   . Episodic mood disorder ([place])    mostly anxiety--depression in the past  . Gastric ulcer    History  . History of blood transfusion    At [facility] appro 15 yrs ago  . History of kidney stones 1960  . Hypertension   . Osteoarthritis, hand    hand  . Postmenopausal disorder   . SVD (spontaneous vaginal delivery)    x 3   Past Surgical History:  Procedure Laterality Date  . CHOLECYSTECTOMY  2008  . COLONOSCOPY  04/2011  . CYSTOCELE REPAIR N/A 12/31/2013   Procedure: ANTERIOR REPAIR (CYSTOCELE);  Surgeon: Donnamae Jude, MD;  Location: [facility];  Service: Gynecology;  Laterality: N/A;  . CYSTOSCOPY W/ URETERAL STENT PLACEMENT Left 01/16/2016   Procedure: CYSTOSCOPY WITH STENT REPLACEMENT;  Surgeon: Hollice Espy, MD;  Location: [facility];  Service: Urology;  Laterality: Left;  . CYSTOSCOPY WITH STENT PLACEMENT Left 12/23/2015   Procedure: CYSTOSCOPY WITH STENT PLACEMENT;  Surgeon: Ardis Hughs, MD;  Location: [facility];  Service: Urology;  Laterality: Left;  . DIAGNOSTIC LAPAROSCOPY    . kidney stone removed    . MELANOMA EXCISION  2015   left posterior thigh  . TONSILLECTOMY  1944  . TUBAL LIGATION    . UPPER GI ENDOSCOPY    . URETEROSCOPY WITH HOLMIUM LASER LITHOTRIPSY Left 01/16/2016   Procedure: URETEROSCOPY WITH HOLMIUM LASER  LITHOTRIPSY;  Surgeon: Hollice Espy, MD;  Location: [facility];  Service: Urology;  Laterality: Left;  . WISDOM TOOTH EXTRACTION     Family History  Problem Relation Age of Onset  . Alzheimer's disease Mother   . Alcohol abuse Father   . COPD Brother   . Heart disease Neg Hx   . Cancer Neg Hx    Social History  Substance Use Topics  . Smoking status: Never Smoker  . Smokeless tobacco: Never Used  . Alcohol use No      Review of Systems Per HPI    Objective:   Physical Exam  Constitutional: She is oriented to person, place, and time. She appears well-developed and well-nourished. No distress.  HENT:  Head: Normocephalic and atraumatic.  Eyes: Pupils are equal, round, and reactive to light. Conjunctivae are normal. Right eye exhibits hordeolum. Right eye exhibits no discharge. Left eye exhibits no discharge.    Cardiovascular: Normal rate.   Pulmonary/Chest: Effort normal.  Abdominal: Soft. She exhibits no distension. There is no tenderness. There is no rebound, no guarding and no CVA tenderness.  Neurological: She is alert and oriented to person, place, and time.  Skin: Skin is warm and dry. She is not diaphoretic.  Psychiatric: She has a normal mood and affect. Her behavior is normal. Judgment and thought content normal.  Vitals reviewed.  BP 130/80 (BP Location: Left Arm, Patient Position: Sitting, Cuff Size: Normal)   Pulse 76   Temp 97.6 F (36.4 C) (Oral)   Wt 122 lb (55.3 kg)   SpO2 99%   BMI 23.43 kg/m  Wt Readings from Last 3 Encounters:  02/20/17 122 lb (55.3 kg)  11/28/16 124 lb (56.2 kg)  11/02/16 124 lb 12.8 oz (56.6 kg)       Assessment & Plan:  1. Hordeolum externum of right lower eyelid -Provided written and verbal information regarding diagnosis and treatment. - warm compresses multiple times a day - RTC precautions reviewed  2. Dysuria - POCT urinalysis dipstick  3. Need for shingles vaccine - provided written prescriptions for  Shingrix x 2   4. Cystitis - cephALEXin (KEFLEX) 500 MG capsule; Take 1 capsule (500 mg total) by mouth 2 (two) times daily.  Dispense: 14 capsule; Refill: 0 - Urine Culture   Clarene Reamer, FNP-BC  [facility]  02/20/2017 12:49 PM

## 2017-02-23 LAB — URINE CULTURE
MICRO NUMBER: 81221824
SPECIMEN QUALITY: ADEQUATE

## 2017-03-18 ENCOUNTER — Other Ambulatory Visit: Payer: Self-pay | Admitting: Internal Medicine

## 2017-03-18 NOTE — Telephone Encounter (Signed)
Approved: 30 x 0 

## 2017-03-18 NOTE — Telephone Encounter (Signed)
Last filled 02-13-17 #30 Last OV 02-20-17 No Future OV

## 2017-03-18 NOTE — Telephone Encounter (Signed)
Left refill on voice mail at pharmacy  

## 2017-04-24 ENCOUNTER — Other Ambulatory Visit: Payer: Self-pay | Admitting: Internal Medicine

## 2017-04-26 NOTE — Telephone Encounter (Signed)
Electronic refill Last refill 03/18/17 #30 Last office visit 02/20/17/acute

## 2017-04-26 NOTE — Telephone Encounter (Signed)
Approved: 30 x 0 

## 2017-04-29 NOTE — Telephone Encounter (Signed)
Left refill on voice mail at pharmacy  

## 2017-05-17 ENCOUNTER — Ambulatory Visit (INDEPENDENT_AMBULATORY_CARE_PROVIDER_SITE_OTHER): Payer: PPO | Admitting: Internal Medicine

## 2017-05-17 ENCOUNTER — Encounter: Payer: Self-pay | Admitting: Internal Medicine

## 2017-05-17 VITALS — BP 122/80 | HR 60 | Temp 98.0°F | Wt 123.0 lb

## 2017-05-17 DIAGNOSIS — J029 Acute pharyngitis, unspecified: Secondary | ICD-10-CM | POA: Diagnosis not present

## 2017-05-17 DIAGNOSIS — R300 Dysuria: Secondary | ICD-10-CM | POA: Diagnosis not present

## 2017-05-17 LAB — POC URINALSYSI DIPSTICK (AUTOMATED)
BILIRUBIN UA: NEGATIVE
GLUCOSE UA: NEGATIVE
KETONES UA: NEGATIVE
LEUKOCYTES UA: NEGATIVE
NITRITE UA: NEGATIVE
PH UA: 6 (ref 5.0–8.0)
Protein, UA: NEGATIVE
RBC UA: NEGATIVE
Spec Grav, UA: 1.015 (ref 1.010–1.025)
Urobilinogen, UA: 0.2 E.U./dL

## 2017-05-17 MED ORDER — NITROFURANTOIN MONOHYD MACRO 100 MG PO CAPS: 100.0000 mg | ORAL_CAPSULE | Freq: Two times a day (BID) | ORAL | 1 refills | Status: DC

## 2017-05-17 MED ORDER — OMEPRAZOLE 20 MG PO CPDR: 20.0000 mg | DELAYED_RELEASE_CAPSULE | Freq: Two times a day (BID) | ORAL | 3 refills | Status: DC

## 2017-05-17 NOTE — Assessment & Plan Note (Addendum)
Recurrent bladder problems Urinalysis--normal Will try 3 days of macrobid

## 2017-05-17 NOTE — Assessment & Plan Note (Signed)
History is consistent with reflux esophagitis and LPR Only on sucralfate for past ulcers but persistent symptoms Doesn't seem to have infection Will try omeprazole bid x at least 2 weeks and cut back if symptoms improve

## 2017-05-17 NOTE — Progress Notes (Signed)
Subjective:    Patient ID: Katie Bell, female    DOB: 1941/02/16, 77 y.o.   MRN: [id number]  HPI Here due to sore throat Trouble swallowing  Feels like glands are swollen   Started about 2 weeks ago Worse in past 2 days Has to regurgitate her bigger pills--and some food No fever Some cough--not much No fever, chills or sweats   Some heartburn--mostly at night when lying down----then will take sucralfate (takes bid but often forgets the night dose) No SOB  Current Outpatient Medications on File Prior to Visit  Medication Sig Dispense Refill  . Cholecalciferol (VITAMIN D) 400 UNITS capsule Take 800 Units by mouth daily.    . diazepam (VALIUM) 10 MG tablet TAKE 1/2 TABLET BY MOUTH TWICE A DAY AS NEEDED FOR ANXIETY OR SLEEP 30 tablet 0  . fluticasone (FLONASE) 50 MCG/ACT nasal spray Place 1 spray into both nostrils 2 (two) times daily. 16 g 1  . losartan-hydrochlorothiazide (HYZAAR) 50-12.5 MG tablet TAKE 1 TABLET BY MOUTH ONCE DAILY 90 tablet 3  . Omega-3 Fatty Acids (FISH OIL) 1000 MG CAPS Take 1,000 mg by mouth daily.     . sucralfate (CARAFATE) 1 g tablet TAKE 1 TABLET BY MOUTH 4 TIMES DAILY WITH MEALS AND AT BEDTIME 120 tablet 11  . vitamin C (ASCORBIC ACID) 500 MG tablet Take 500 mg by mouth daily.     No current facility-administered medications on file prior to visit.     Allergies  Allergen Reactions  . Sulfamethoxazole-Trimethoprim Other (See Comments)  . Morphine     agitation  . Morphine And Related Anxiety    Past Medical History:  Diagnosis Date  . Anxiety   . Cancer ([place]) 2015  . Depression   . Episodic mood disorder ([place])    mostly anxiety--depression in the past  . Gastric ulcer    History  . History of blood transfusion    At [facility] appro [facility] yrs ago  . History of kidney stones 1960  . Hypertension   . Osteoarthritis, hand    hand  . Postmenopausal disorder   . SVD (spontaneous vaginal delivery)    x 3    Past  Surgical History:  Procedure Laterality Date  . CHOLECYSTECTOMY  2008  . COLONOSCOPY  04/2011  . CYSTOCELE REPAIR N/A 12/31/2013   Procedure: ANTERIOR REPAIR (CYSTOCELE);  Surgeon: Donnamae Jude, MD;  Location: [facility];  Service: Gynecology;  Laterality: N/A;  . CYSTOSCOPY W/ URETERAL STENT PLACEMENT Left 01/16/2016   Procedure: CYSTOSCOPY WITH STENT REPLACEMENT;  Surgeon: Hollice Espy, MD;  Location: [facility];  Service: Urology;  Laterality: Left;  . CYSTOSCOPY WITH STENT PLACEMENT Left 12/23/2015   Procedure: CYSTOSCOPY WITH STENT PLACEMENT;  Surgeon: Ardis Hughs, MD;  Location: [facility];  Service: Urology;  Laterality: Left;  . DIAGNOSTIC LAPAROSCOPY    . kidney stone removed    . MELANOMA EXCISION  2015   left posterior thigh  . TONSILLECTOMY  1944  . TUBAL LIGATION    . UPPER GI ENDOSCOPY    . URETEROSCOPY WITH HOLMIUM LASER LITHOTRIPSY Left 01/16/2016   Procedure: URETEROSCOPY WITH HOLMIUM LASER LITHOTRIPSY;  Surgeon: Hollice Espy, MD;  Location: [facility];  Service: Urology;  Laterality: Left;  . WISDOM TOOTH EXTRACTION      Family History  Problem Relation Age of Onset  . Alzheimer's disease Mother   . Alcohol abuse Father   . COPD Brother   . Heart disease Neg  Hx   . Cancer Neg Hx     Social History   Socioeconomic History  . Marital status: Divorced    Spouse name: Not on file  . Number of children: 3  . Years of education: Not on file  . Highest education level: Not on file  Social Needs  . Financial resource strain: Not on file  . Food insecurity - worry: Not on file  . Food insecurity - inability: Not on file  . Transportation needs - medical: Not on file  . Transportation needs - non-medical: Not on file  Occupational History  . 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 Smoking status: Never Smoker  . Smokeless tobacco: Never Used  Substance and Sexual Activity  . Alcohol use: No    Alcohol/week: 0.0 oz  .  Drug use: No  . Sexual activity: Yes    Birth control/protection: Post-menopausal  Other Topics Concern  . Not on file  Social History Narrative   3 sons--- middle one lives with her   [name]      No living will   Son [name]. Then son [name]   Would accept resuscitation attempts but no prolonged ventilation   No tube feeds if cognitively unaware      Review of Systems Appetite is okay Weight stable Sleep is variable---has burning dysuria now (thinks there is an infection now) No ear pain    Objective:   Physical Exam  Constitutional: She appears well-developed. No distress.  HENT:  Mouth/Throat: Oropharynx is clear and moist. No oropharyngeal exudate.  Neck: No thyromegaly present.  Pulmonary/Chest: Effort normal and breath sounds normal. No respiratory distress. She has no wheezes. She has no rales.  Abdominal: Soft.  ?slight suprapubic tenderness  Musculoskeletal: She exhibits no edema.  Lymphadenopathy:    She has no cervical adenopathy.          Assessment & Plan:

## 2017-05-17 NOTE — Patient Instructions (Signed)
Please start the omeprazole at 20mg  twice a day (on empty stomach). If your sore throat and swallowing improve quickly, you can cut back to just once a day after 2-3 weeks. If not, continue on the twice a day. Let me know if you have ongoing problems after a month or so.

## 2017-05-28 DIAGNOSIS — R69 Illness, unspecified: Secondary | ICD-10-CM | POA: Diagnosis not present

## 2017-05-29 ENCOUNTER — Other Ambulatory Visit: Payer: Self-pay | Admitting: Internal Medicine

## 2017-05-29 NOTE — Telephone Encounter (Signed)
Last filled 04-29-17 #30 Last OV Acute 05-17-17 Next OV 10-18-17

## 2017-07-06 ENCOUNTER — Other Ambulatory Visit: Payer: Self-pay | Admitting: Internal Medicine

## 2017-07-08 NOTE — Telephone Encounter (Signed)
Last filled 05-29-17 #30  Last OV acute 05-17-17 Next OV 10-19-17

## 2017-08-08 ENCOUNTER — Other Ambulatory Visit: Payer: Self-pay | Admitting: Internal Medicine

## 2017-08-08 NOTE — Telephone Encounter (Signed)
Last filled 07-08-17 #30 Last OV 11-28-16 Next OV 10-18-17  Forward to Dr Darnell Level in Dr Alla German absence.

## 2017-08-12 NOTE — Telephone Encounter (Signed)
Eprescribed.

## 2017-08-26 ENCOUNTER — Other Ambulatory Visit: Payer: Self-pay | Admitting: Internal Medicine

## 2017-09-20 ENCOUNTER — Other Ambulatory Visit: Payer: Self-pay | Admitting: Family Medicine

## 2017-09-20 NOTE — Telephone Encounter (Signed)
Last filled:  08/12/17, #30 Last OV:  02/20/17 Next OV:  10/18/17

## 2017-10-18 ENCOUNTER — Ambulatory Visit (INDEPENDENT_AMBULATORY_CARE_PROVIDER_SITE_OTHER): Payer: PPO | Admitting: Internal Medicine

## 2017-10-18 ENCOUNTER — Encounter: Payer: Self-pay | Admitting: Internal Medicine

## 2017-10-18 VITALS — BP 122/70 | HR 67 | Temp 98.2°F | Ht 60.5 in | Wt 122.0 lb

## 2017-10-18 DIAGNOSIS — Z0000 Encounter for general adult medical examination without abnormal findings: Secondary | ICD-10-CM

## 2017-10-18 DIAGNOSIS — Z7189 Other specified counseling: Secondary | ICD-10-CM | POA: Diagnosis not present

## 2017-10-18 DIAGNOSIS — K257 Chronic gastric ulcer without hemorrhage or perforation: Secondary | ICD-10-CM

## 2017-10-18 DIAGNOSIS — F39 Unspecified mood [affective] disorder: Secondary | ICD-10-CM | POA: Diagnosis not present

## 2017-10-18 DIAGNOSIS — I10 Essential (primary) hypertension: Secondary | ICD-10-CM

## 2017-10-18 LAB — COMPREHENSIVE METABOLIC PANEL
ALT: 12 U/L (ref 0–35)
AST: 16 U/L (ref 0–37)
Albumin: 4.7 g/dL (ref 3.5–5.2)
Alkaline Phosphatase: 48 U/L (ref 39–117)
BUN: 10 mg/dL (ref 6–23)
CO2: 28 meq/L (ref 19–32)
CREATININE: 0.85 mg/dL (ref 0.40–1.20)
Calcium: 10.2 mg/dL (ref 8.4–10.5)
Chloride: 99 mEq/L (ref 96–112)
GFR: 68.72 mL/min (ref >60.00)
Glucose, Bld: 104 mg/dL — ABNORMAL HIGH (ref 70–99)
POTASSIUM: 4.3 meq/L (ref 3.5–5.1)
SODIUM: 137 meq/L (ref 135–145)
Total Bilirubin: 0.8 mg/dL (ref 0.2–1.2)
Total Protein: 7.7 g/dL (ref 6.0–8.3)

## 2017-10-18 LAB — CBC
HCT: 43.4 % (ref 36.0–46.0)
HEMOGLOBIN: 15 g/dL (ref 12.0–15.0)
MCHC: 34.5 g/dL (ref 30.0–36.0)
MCV: 92.9 fl (ref 78.0–100.0)
Platelets: 240 10*3/uL (ref 150.0–400.0)
RBC: 4.67 Mil/uL (ref 3.87–5.11)
RDW: 12.4 % (ref 11.5–15.5)
WBC: 6.9 10*3/uL (ref 4.0–10.5)

## 2017-10-18 MED ORDER — DIAZEPAM 10 MG PO TABS: ORAL_TABLET | ORAL | 0 refills | Status: DC

## 2017-10-18 NOTE — Assessment & Plan Note (Signed)
I have personally reviewed the Medicare Annual Wellness questionnaire and have noted 1. The patient's medical and social history 2. Their use of alcohol, tobacco or illicit drugs 3. Their current medications and supplements 4. The patient's functional ability including ADL's, fall risks, home safety risks and hearing or visual             impairment. 5. Diet and physical activities 6. Evidence for depression or mood disorders  The patients weight, height, BMI and visual acuity have been recorded in the chart I have made referrals, counseling and provided education to the patient based review of the above and I have provided the pt with a written personalized care plan for preventive services.  I have provided you with a copy of your personalized plan for preventive services. Please take the time to review along with your updated medication list.  Will be getting the 2nd shingrix Done with colon cancer screening She does plan a last mammogram soon Prefers no DEXA Yearly flu vaccine

## 2017-10-18 NOTE — Patient Instructions (Addendum)
You should try over the counter famotidine (pepcid) 20mg  twice a day or omeprazole (prilosec) 20mg  daily on an empty stomach if you have regular acid reflux.

## 2017-10-18 NOTE — Progress Notes (Signed)
Hearing Screening   Method: Audiometry   125Hz  250Hz  500Hz  1000Hz  2000Hz  3000Hz  4000Hz  6000Hz  8000Hz   Right ear:   20 20 20   40    Left ear:   20 20 20   40      Visual Acuity Screening   Right eye Left eye Both eyes  Without correction:     With correction: 20/40 20/40 20/30

## 2017-10-18 NOTE — Progress Notes (Signed)
Subjective:    Patient ID: Katie Bell, female    DOB: [birth year], 77 y.o.   MRN: [id number]  HPI Here for Medicare wellness and follow up of chronic health conditions Reviewed form and advanced directives Reviewed other doctors No alcohol or tobacco Walks regularly Hearing is fine Some trouble with night vision---following with eye doctor No falls--get railing by the 5 steps down to garage Mild dysthymia is chronic. Anxious chronically as well. No persistent problems. Still uses the valium at night (only thing to calm her if she gets upset) Son still living with her--his attitude towards her has improved a bit Independent with instrumental ADLs No sig memory problems  Knees are better Not needing pain meds other than tylenol  Still has episodic stomach pain Using the carafate regularly again--once or twice a day. This helps Does get some acid reflux---rare swallowing problems  Pulled something in her left flank and back Did slowly improve Still has some urinary urgency and frequency No recent visits with Dr Erlene Quan the urologist (no recurrent stones since off calcium)  No chest pain Occasionally feels heart---but no racing No dizziness or syncope No edema No SOB  Current Outpatient Medications on File Prior to Visit  Medication Sig Dispense Refill  . Cholecalciferol (VITAMIN D) 400 UNITS capsule Take 800 Units by mouth daily.    . diazepam (VALIUM) 10 MG tablet TAKE 1/2 TABLET BY MOUTH TWICE DAILY AS NEEDED FOR ANXIETY OR SLEEP 30 tablet 0  . fluticasone (FLONASE) 50 MCG/ACT nasal spray Place 1 spray into both nostrils 2 (two) times daily. 16 g 1  . losartan-hydrochlorothiazide (HYZAAR) 50-12.5 MG tablet TAKE 1 TABLET BY MOUTH ONCE DAILY 90 tablet 3  . Omega-3 Fatty Acids (FISH OIL) 1000 MG CAPS Take 1,000 mg by mouth daily.     Marland Kitchen omeprazole (PRILOSEC) 20 MG capsule Take 1 capsule (20 mg total) by mouth 2 (two) times daily before a meal. 180 capsule 3  . sucralfate  (CARAFATE) 1 g tablet TAKE 1 TABLET BY MOUTH 4 TIMES DAILY WITH MEALS AND AT BEDTIME 120 tablet 11  . vitamin C (ASCORBIC ACID) 500 MG tablet Take 500 mg by mouth daily.     No current facility-administered medications on file prior to visit.     Allergies  Allergen Reactions  . Sulfamethoxazole-Trimethoprim Other (See Comments)  . Morphine     agitation  . Morphine And Related Anxiety    Past Medical History:  Diagnosis Date  . Anxiety   . Cancer ([place]) 2015  . Depression   . Episodic mood disorder ([place])    mostly anxiety--depression in the past  . Gastric ulcer    History  . History of blood transfusion    At [facility] appro [facility] yrs ago  . History of kidney stones 1960  . Hypertension   . Osteoarthritis, hand    hand  . Postmenopausal disorder   . SVD (spontaneous vaginal delivery)    x 3    Past Surgical History:  Procedure Laterality Date  . CHOLECYSTECTOMY  2008  . COLONOSCOPY  04/2011  . CYSTOCELE REPAIR N/A 12/31/2013   Procedure: ANTERIOR REPAIR (CYSTOCELE);  Surgeon: Donnamae Jude, MD;  Location: [facility];  Service: Gynecology;  Laterality: N/A;  . CYSTOSCOPY W/ URETERAL STENT PLACEMENT Left 01/16/2016   Procedure: CYSTOSCOPY WITH STENT REPLACEMENT;  Surgeon: Hollice Espy, MD;  Location: [facility];  Service: Urology;  Laterality: Left;  . CYSTOSCOPY WITH STENT PLACEMENT Left 12/23/2015  Procedure: CYSTOSCOPY WITH STENT PLACEMENT;  Surgeon: Ardis Hughs, MD;  Location: [facility];  Service: Urology;  Laterality: Left;  . DIAGNOSTIC LAPAROSCOPY    . kidney stone removed    . MELANOMA EXCISION  2015   left posterior thigh  . TONSILLECTOMY  1944  . TUBAL LIGATION    . UPPER GI ENDOSCOPY    . URETEROSCOPY WITH HOLMIUM LASER LITHOTRIPSY Left 01/16/2016   Procedure: URETEROSCOPY WITH HOLMIUM LASER LITHOTRIPSY;  Surgeon: Hollice Espy, MD;  Location: [facility];  Service: Urology;  Laterality: Left;  . WISDOM TOOTH EXTRACTION      Family History    Problem Relation Age of Onset  . Alzheimer's disease Mother   . Alcohol abuse Father   . COPD Brother   . Heart disease Neg Hx   . Cancer Neg Hx     Social History   Socioeconomic History  . Marital status: Divorced    Spouse name: Not on file  . Number of children: 3  . Years of education: Not on file  . Highest education level: Not on file  Occupational History  . 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 Financial resource strain: Not on file  . Food insecurity:    Worry: Not on file    Inability: Not on file  . Transportation needs:    Medical: Not on file    Non-medical: Not on file  Tobacco Use  . Smoking status: Never Smoker  . Smokeless tobacco: Never Used  Substance and Sexual Activity  . Alcohol use: No    Alcohol/week: 0.0 oz  . Drug use: No  . Sexual activity: Yes    Birth control/protection: Post-menopausal  Lifestyle  . Physical activity:    Days per week: Not on file    Minutes per session: Not on file  . Stress: Not on file  Relationships  . Social connections:    Talks on phone: Not on file    Gets together: Not on file    Attends religious service: Not on file    Active member of club or organization: Not on file    Attends meetings of clubs or organizations: Not on file    Relationship status: Not on file  . Intimate partner violence:    Fear of current or ex partner: Not on file    Emotionally abused: Not on file    Physically abused: Not on file    Forced sexual activity: Not on file  Other Topics Concern  . Not on file  Social History Narrative   3 sons--- middle one lives with her   Goes by Katie Bell      Has living will   Son [name]. Then son [name]   Would accept resuscitation attempts but no prolonged ventilation   No tube feeds if cognitively unaware      Review of Systems No recent headaches--tylenol helps if gets one Appetite is okay Weight is stable Not a  good sleeper ---up 3-4 times per night to void also Daytime urinary frequency as well. No pain or blood Bowels are fine. No blood Wears seat belt Teeth are fine--regular with dentist No rash or suspicious lesions recently    Objective:   Physical Exam  Constitutional: She is oriented to person, place, and time. She appears well-developed. No distress.  HENT:  Mouth/Throat: Oropharynx is clear and moist. No oropharyngeal exudate.  Neck: No  thyromegaly present.  Cardiovascular: Normal rate, regular rhythm, normal heart sounds and intact distal pulses. Exam reveals no gallop.  No murmur heard. Respiratory: Effort normal and breath sounds normal. No respiratory distress. She has no wheezes. She has no rales.  GI: Soft.  Slight epigastric tenderness  Musculoskeletal: She exhibits no edema or tenderness.  Lymphadenopathy:    She has no cervical adenopathy.  Neurological: She is alert and oriented to person, place, and time.  President--- "Katie Bell--- Obama" Doesn't do numbers D-l-o-w Recall 3/3  Skin: No rash noted. No erythema.  Psychiatric: She has a normal mood and affect. Her behavior is normal.           Assessment & Plan:

## 2017-10-18 NOTE — Assessment & Plan Note (Signed)
See social history 

## 2017-10-18 NOTE — Assessment & Plan Note (Signed)
Chronic anxiety and dysthymia Does okay with the diazepam at night Son is being better with her ---has helped her mood

## 2017-10-18 NOTE — Assessment & Plan Note (Signed)
Uses the sucralfate Discussed PPI or famotidine for more classic acid reflux symptoms

## 2017-10-18 NOTE — Assessment & Plan Note (Signed)
BP Readings from Last 3 Encounters:  10/18/17 122/70  05/17/17 122/80  02/20/17 130/80   Good control Due for labs

## 2017-11-29 ENCOUNTER — Encounter: Payer: Self-pay | Admitting: Internal Medicine

## 2017-11-29 DIAGNOSIS — Z1231 Encounter for screening mammogram for malignant neoplasm of breast: Secondary | ICD-10-CM | POA: Diagnosis not present

## 2017-12-31 ENCOUNTER — Other Ambulatory Visit: Payer: Self-pay | Admitting: Internal Medicine

## 2017-12-31 NOTE — Telephone Encounter (Signed)
Last filled 10-18-17 #30 Last OV 10-18-17 No Future OV

## 2018-01-04 ENCOUNTER — Other Ambulatory Visit: Payer: Self-pay | Admitting: Internal Medicine

## 2018-03-11 ENCOUNTER — Other Ambulatory Visit: Payer: Self-pay | Admitting: Internal Medicine

## 2018-03-11 NOTE — Telephone Encounter (Signed)
Last filled 12-31-17 #30 Last OV/CPE 10-18-17 No Future [place]

## 2018-05-06 ENCOUNTER — Ambulatory Visit (INDEPENDENT_AMBULATORY_CARE_PROVIDER_SITE_OTHER): Payer: PPO | Admitting: Internal Medicine

## 2018-05-06 ENCOUNTER — Encounter: Payer: Self-pay | Admitting: Internal Medicine

## 2018-05-06 VITALS — BP 118/70 | HR 67 | Temp 97.9°F | Ht 60.5 in | Wt 124.0 lb

## 2018-05-06 DIAGNOSIS — N3000 Acute cystitis without hematuria: Secondary | ICD-10-CM | POA: Diagnosis not present

## 2018-05-06 DIAGNOSIS — R300 Dysuria: Secondary | ICD-10-CM

## 2018-05-06 LAB — POC URINALSYSI DIPSTICK (AUTOMATED)
Bilirubin, UA: NEGATIVE
GLUCOSE UA: NEGATIVE
Ketones, UA: NEGATIVE
NITRITE UA: NEGATIVE
PH UA: 6 (ref 5.0–8.0)
PROTEIN UA: NEGATIVE
RBC UA: NEGATIVE
Spec Grav, UA: >=1.03 — AB (ref 1.010–1.025)
Urobilinogen, UA: 0.2 E.U./dL

## 2018-05-06 MED ORDER — DIAZEPAM 10 MG PO TABS: ORAL_TABLET | ORAL | 0 refills | Status: DC

## 2018-05-06 MED ORDER — NITROFURANTOIN MONOHYD MACRO 100 MG PO CAPS: 100.0000 mg | ORAL_CAPSULE | Freq: Two times a day (BID) | ORAL | 0 refills | Status: DC

## 2018-05-06 NOTE — Assessment & Plan Note (Signed)
It has been a year Culture 10/18 showed pansensitive E coli Will treat for 3 days Discussed cranberry

## 2018-05-06 NOTE — Patient Instructions (Signed)
If your burning with urination goes away with 1-2 doses of the nitrofurantoin, you can stop after 3 days. Then you can use the leftover medication if your symptoms come back in the future. Consider taking a cranberry tablet daily to prevent bladder infections.

## 2018-05-06 NOTE — Progress Notes (Signed)
Subjective:    Patient ID: Katie Bell, female    DOB: [birth year], 78 y.o.   MRN: [id number]  HPI Here due to urinary symptoms  Started with mild symptoms ~2 weeks ago Switched off sodas to tea---no change in urinary symptoms Having burning dysuria Now with nocturia x 2--with pain Increased frequency---but not much urgency  No fever Increased fluids--but no meds for this  Current Outpatient Medications on File Prior to Visit  Medication Sig Dispense Refill  . Cholecalciferol (VITAMIN D) 400 UNITS capsule Take 800 Units by mouth daily.    . diazepam (VALIUM) 10 MG tablet TAKE HALF A TABLET (5 MG TOTAL) BY MOUTHTWICE DAILY AS NEEDED FOR ANXIETY OR SLEEP 30 tablet 0  . fluticasone (FLONASE) 50 MCG/ACT nasal spray Place 1 spray into both nostrils 2 (two) times daily. 16 g 1  . losartan-hydrochlorothiazide (HYZAAR) 50-12.5 MG tablet TAKE 1 TABLET BY MOUTH ONCE DAILY 90 tablet 3  . Omega-3 Fatty Acids (FISH OIL) 1000 MG CAPS Take 1,000 mg by mouth daily.     Marland Kitchen omeprazole (PRILOSEC) 20 MG capsule Take 1 capsule (20 mg total) by mouth 2 (two) times daily before a meal. 180 capsule 3  . sucralfate (CARAFATE) 1 g tablet TAKE 1 TABLET BY MOUTH 4 TIMES DAILY WITH MEALS AND AT BEDTIME 120 tablet 11  . vitamin C (ASCORBIC ACID) 500 MG tablet Take 500 mg by mouth daily.     No current facility-administered medications on file prior to visit.     Allergies  Allergen Reactions  . Sulfamethoxazole-Trimethoprim Other (See Comments)  . Morphine     agitation  . Morphine And Related Anxiety    Past Medical History:  Diagnosis Date  . Anxiety   . Cancer ([place]) 2015  . Depression   . Episodic mood disorder ([place])    mostly anxiety--depression in the past  . Gastric ulcer    History  . History of blood transfusion    At [facility] appro 15 yrs ago  . History of kidney stones 1960  . Hypertension   . Osteoarthritis, hand    hand  . Postmenopausal disorder   . SVD  (spontaneous vaginal delivery)    x 3    Past Surgical History:  Procedure Laterality Date  . CHOLECYSTECTOMY  2008  . COLONOSCOPY  04/2011  . CYSTOCELE REPAIR N/A 12/31/2013   Procedure: ANTERIOR REPAIR (CYSTOCELE);  Surgeon: Donnamae Jude, MD;  Location: [facility];  Service: Gynecology;  Laterality: N/A;  . CYSTOSCOPY W/ URETERAL STENT PLACEMENT Left 01/16/2016   Procedure: CYSTOSCOPY WITH STENT REPLACEMENT;  Surgeon: Hollice Espy, MD;  Location: [facility];  Service: Urology;  Laterality: Left;  . CYSTOSCOPY WITH STENT PLACEMENT Left 12/23/2015   Procedure: CYSTOSCOPY WITH STENT PLACEMENT;  Surgeon: Ardis Hughs, MD;  Location: [facility];  Service: Urology;  Laterality: Left;  . DIAGNOSTIC LAPAROSCOPY    . kidney stone removed    . MELANOMA EXCISION  2015   left posterior thigh  . TONSILLECTOMY  1944  . TUBAL LIGATION    . UPPER GI ENDOSCOPY    . URETEROSCOPY WITH HOLMIUM LASER LITHOTRIPSY Left 01/16/2016   Procedure: URETEROSCOPY WITH HOLMIUM LASER LITHOTRIPSY;  Surgeon: Hollice Espy, MD;  Location: [facility];  Service: Urology;  Laterality: Left;  . WISDOM TOOTH EXTRACTION      Family History  Problem Relation Age of Onset  . Alzheimer's disease Mother   . Alcohol abuse Father   . COPD  Brother   . Heart disease Neg Hx   . Cancer Neg Hx     Social History   Socioeconomic History  . Marital status: Divorced    Spouse name: Not on file  . Number of children: 3  . Years of education: Not on file  . Highest education level: Not on file  Occupational History  . 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 Financial resource strain: Not on file  . Food insecurity:    Worry: Not on file    Inability: Not on file  . Transportation needs:    Medical: Not on file    Non-medical: Not on file  Tobacco Use  . Smoking status: Never Smoker  . Smokeless tobacco: Never Used  Substance and Sexual Activity  . Alcohol use: No    Alcohol/week:  0.0 standard drinks  . Drug use: No  . Sexual activity: Yes    Birth control/protection: Post-menopausal  Lifestyle  . Physical activity:    Days per week: Not on file    Minutes per session: Not on file  . Stress: Not on file  Relationships  . Social connections:    Talks on phone: Not on file    Gets together: Not on file    Attends religious service: Not on file    Active member of club or organization: Not on file    Attends meetings of clubs or organizations: Not on file    Relationship status: Not on file  . Intimate partner violence:    Fear of current or ex partner: Not on file    Emotionally abused: Not on file    Physically abused: Not on file    Forced sexual activity: Not on file  Other Topics Concern  . Not on file  Social History Narrative   3 sons--- middle one lives with her   Goes by Katie Bell      Has living will   Son [name]. Then son [name]   Would accept resuscitation attempts but no prolonged ventilation   No tube feeds if cognitively unaware      Review of Systems  No N/V Eating okay Some mid back pain---seems new     Objective:   Physical Exam  Constitutional: She appears well-developed. No distress.  GI: Soft. She exhibits no distension. There is no rebound and no guarding.  Only mild upper abdominal tenderness (she relates to her ulcer)  Musculoskeletal:     Comments: No CVA tenderness  Psychiatric: She has a normal mood and affect. Her behavior is normal.           Assessment & Plan:

## 2018-06-10 ENCOUNTER — Other Ambulatory Visit: Payer: Self-pay | Admitting: Internal Medicine

## 2018-06-24 ENCOUNTER — Other Ambulatory Visit: Payer: Self-pay | Admitting: Internal Medicine

## 2018-06-24 NOTE — Telephone Encounter (Signed)
Last office visit 05/06/2018 for acute cystitis.   Last refilled diazepam 05/06/2018 for #30 with no refills.  Macrobid 05/06/2018 for #20 with no refills.  Next Appt: 11/04/2018 for CPE.  UDS/Contract 10/17/2016.

## 2018-06-25 ENCOUNTER — Other Ambulatory Visit: Payer: Self-pay | Admitting: Internal Medicine

## 2018-06-27 ENCOUNTER — Ambulatory Visit (INDEPENDENT_AMBULATORY_CARE_PROVIDER_SITE_OTHER): Payer: PPO | Admitting: Family Medicine

## 2018-06-27 ENCOUNTER — Encounter: Payer: Self-pay | Admitting: Family Medicine

## 2018-06-27 VITALS — BP 140/70 | HR 71 | Temp 98.3°F | Ht 60.5 in | Wt 125.5 lb

## 2018-06-27 DIAGNOSIS — B309 Viral conjunctivitis, unspecified: Secondary | ICD-10-CM | POA: Diagnosis not present

## 2018-06-27 DIAGNOSIS — R300 Dysuria: Secondary | ICD-10-CM

## 2018-06-27 LAB — POC URINALSYSI DIPSTICK (AUTOMATED)
Bilirubin, UA: NEGATIVE
Blood, UA: NEGATIVE
Glucose, UA: NEGATIVE
Ketones, UA: NEGATIVE
NITRITE UA: NEGATIVE
Protein, UA: NEGATIVE
Spec Grav, UA: 1.015 (ref 1.010–1.025)
Urobilinogen, UA: 0.2 E.U./dL
pH, UA: 6 (ref 5.0–8.0)

## 2018-06-27 NOTE — Assessment & Plan Note (Signed)
Symptomatic care. Saline drops for eye.

## 2018-06-27 NOTE — Progress Notes (Signed)
Subjective:    Patient ID: Katie Bell, female    DOB: 07/14/1940, 78 y.o.   MRN: [id number]  Conjunctivitis   The current episode started 2 days ago. The onset was sudden. The problem has been gradually worsening. Relieved by:  has tried clear eye drops. Nothing aggravates the symptoms. Associated symptoms include eye itching and eye redness. Pertinent negatives include no fever, no ear discharge, no ear pain, no rhinorrhea, no sore throat and no cough. The left eye is affected. The eyelid exhibits no abnormality.  no foreign body sensation,  Does have white discharge and crusting in AM.  Wears glasses, not contacts   01/2017 Ecoli.. resolved with treatment  UA clear in 04/2027  In 04/2018 UTI.Marland Kitchen large wbc, no culture, treated nitrofurantion.. burning had resolved but dysuria  Came back off and on. No urgency, increase frequency.  No fever, no flank pain, no abdominal pain.  no gross blood.  Social History /Family History/Past Medical History reviewed in detail and updated in EMR if needed. Blood pressure 140/70, pulse 71, temperature 98.3 F (36.8 C), temperature source Oral, height 5' 0.5" (1.537 m), weight 125 lb 8 oz (56.9 kg).     Review of Systems  Constitutional: Negative for fever.  HENT: Negative for ear discharge, ear pain, rhinorrhea and sore throat.   Eyes: Positive for redness and itching.  Respiratory: Negative for cough.        Objective:   Physical Exam Constitutional:      General: She is not in acute distress.    Appearance: Normal appearance. She is well-developed. She is not ill-appearing or toxic-appearing.  HENT:     Head: Normocephalic.     Right Ear: Hearing, tympanic membrane, ear canal and external ear normal. Tympanic membrane is not erythematous, retracted or bulging.     Left Ear: Hearing, tympanic membrane, ear canal and external ear normal. Tympanic membrane is not erythematous, retracted or bulging.     Nose: No mucosal edema or rhinorrhea.       Right Sinus: No maxillary sinus tenderness or frontal sinus tenderness.     Left Sinus: No maxillary sinus tenderness or frontal sinus tenderness.     Mouth/Throat:     Pharynx: Uvula midline.  Eyes:     General: Lids are normal. Lids are everted, no foreign bodies appreciated. Vision grossly intact.        Left eye: No foreign body.     Extraocular Movements:     Left eye: Normal extraocular motion and no nystagmus.     Conjunctiva/sclera:     Left eye: Left conjunctiva is injected. No chemosis, exudate or hemorrhage.    Pupils: Pupils are equal, round, and reactive to light.  Neck:     Musculoskeletal: Normal range of motion and neck supple.     Thyroid: No thyroid mass or thyromegaly.     Vascular: No carotid bruit.     Trachea: Trachea normal.  Cardiovascular:     Rate and Rhythm: Normal rate and regular rhythm.     Pulses: Normal pulses.     Heart sounds: Normal heart sounds, S1 normal and S2 normal. No murmur. No friction rub. No gallop.   Pulmonary:     Effort: Pulmonary effort is normal. No tachypnea or respiratory distress.     Breath sounds: Normal breath sounds. No decreased breath sounds, wheezing, rhonchi or rales.  Abdominal:     General: Bowel sounds are normal.     Palpations: Abdomen is  soft.     Tenderness: There is no abdominal tenderness.  Skin:    General: Skin is warm and dry.     Findings: No rash.  Neurological:     Mental Status: She is alert.  Psychiatric:        Mood and Affect: Mood is not anxious or depressed.        Speech: Speech normal.        Behavior: Behavior normal. Behavior is cooperative.        Thought Content: Thought content normal.        Judgment: Judgment normal.           Assessment & Plan:

## 2018-06-27 NOTE — Patient Instructions (Signed)
Saline drops for eye. We will call with culture results.  Viral Conjunctivitis, Adult  Viral conjunctivitis is an inflammation of the clear membrane that covers the white part of your eye and the inner surface of your eyelid (conjunctiva). The inflammation is caused by a viral infection. The blood vessels in the conjunctiva become inflamed, causing the eye to become red or pink, and often itchy. Viral conjunctivitis can be easily passed from one person to another (is contagious). This condition is often called pink eye. What are the causes? This condition is caused by a virus. A virus is a type of contagious germ. It can be spread by touching objects that have been contaminated with the virus, such as doorknobs or towels. It can also be passed through droplets, such as from coughing or sneezing. What are the signs or symptoms? Symptoms of this condition include:  Eye redness.  Tearing or watery eyes.  Itchy and irritated eyes.  Burning feeling in the eyes.  Clear drainage from the eye.  Swollen eyelids.  A gritty feeling in the eye.  Light sensitivity. This condition often occurs with other symptoms, such as a fever, nausea, or a rash. How is this diagnosed? This condition is diagnosed with a medical history and physical exam. If you have discharge from your eye, the discharge may be tested to rule out other causes of conjunctivitis. How is this treated? Viral conjunctivitis does not respond to medicines that kill bacteria (antibiotics). Treatment for viral conjunctivitis is directed at stopping a bacterial infection from developing in addition to the viral infection. Treatment also aims to relieve your symptoms, such as itching. This may be done with antihistamine drops or other eye medicines. Rarely, steroid eye drops or antiviral medicines may be prescribed. Follow these instructions at home: Medicines   Take or apply over-the-counter and prescription medicines only as told by  your health care provider.  Be very careful to avoid touching the edge of the eyelid with the eye drop bottle or ointment tube when applying medicines to the affected eye. Being careful this way will stop you from spreading the infection to the other eye or to other people. Eye care  Avoid touching or rubbing your eyes.  Apply a warm, wet, clean washcloth to your eye for 10-20 minutes, 3-4 times per day or as told by your health care provider.  If you wear contact lenses, do not wear them until the inflammation is gone and your health care provider says it is safe to wear them again. Ask your health care provider how to sterilize or replace your contact lenses before using them again. Wear glasses until you can resume wearing contacts.  Avoid wearing eye makeup until the inflammation is gone. Throw away any old eye cosmetics that may be contaminated.  Gently wipe away any drainage from your eye with a warm, wet washcloth or a cotton ball. General instructions  Change or wash your pillowcase every day or as told by your health care provider.  Do not share towels, pillowcases, washcloths, eye makeup, makeup brushes, contact lenses, or glasses. This may spread the infection.  Wash your hands often with soap and water. Use paper towels to dry your hands. If soap and water are not available, use hand sanitizer.  Try to avoid contact with other people for one week or as told by your health care provider. Contact a health care provider if:  Your symptoms do not improve with treatment or they get worse.  You have  increased pain.  Your vision becomes blurry.  You have a fever.  You have facial pain, redness, or swelling.  You have yellow or green drainage coming from your eye.  You have new symptoms. This information is not intended to replace advice given to you by your health care provider. Make sure you discuss any questions you have with your health care provider. Document Released:  06/30/2002 Document Revised: 11/05/2015 Document Reviewed: 10/25/2015 Elsevier Interactive Patient Education  2019 [employer].

## 2018-06-27 NOTE — Assessment & Plan Note (Signed)
UA and culture

## 2018-06-27 NOTE — Addendum Note (Signed)
Addended by: Carter Kitten on: 06/27/2018 10:58 AM   Modules accepted: Orders

## 2018-06-28 LAB — URINE CULTURE
MICRO NUMBER:: 287216
SPECIMEN QUALITY:: ADEQUATE

## 2018-08-01 ENCOUNTER — Other Ambulatory Visit: Payer: Self-pay | Admitting: Internal Medicine

## 2018-08-04 NOTE — Telephone Encounter (Signed)
Eprescribed.

## 2018-08-04 NOTE — Telephone Encounter (Signed)
[place] pt, out of the office  Last refilled diazepam 06/24/2018 for #30 with no refills. Last office visit 05/06/2018 for acute cystitis.  Next Appt: 11/04/2018 for CPE.  UDS/Contract 10/17/2016.

## 2018-08-23 ENCOUNTER — Other Ambulatory Visit: Payer: Self-pay | Admitting: Internal Medicine

## 2018-09-08 ENCOUNTER — Other Ambulatory Visit: Payer: Self-pay | Admitting: Family Medicine

## 2018-09-08 NOTE — Telephone Encounter (Signed)
Last filled 08-04-18 #30 Last OV Acute 06-27-18 Next OV 11-04-18 [place]  Forward to [facility] in Dr Alla German absence

## 2018-10-06 ENCOUNTER — Other Ambulatory Visit: Payer: Self-pay | Admitting: Internal Medicine

## 2018-10-06 ENCOUNTER — Telehealth: Payer: Self-pay | Admitting: *Deleted

## 2018-10-06 NOTE — Telephone Encounter (Signed)
Spoke to pt. She said she thinks it is working. She will continue to take it until her visit in July.

## 2018-10-06 NOTE — Telephone Encounter (Signed)
Patient called stating that she has been taking Neuriva over the counter to help with her memory. Patient stated that the medication cost her about $100. Patient wants to know if Dr. Silvio Pate will prescribe her a medication to help with her memory that will not cost as much? Patient stated that she already has an upcoming appointment scheduled in July. [place]

## 2018-10-06 NOTE — Telephone Encounter (Signed)
Please let her know that there is no medication--either over the counter or prescription--that has been proven to help memory. If she has tried the neuriva and hasn't noticed any change, she should stop. We can discuss this all at her appt in July

## 2018-10-07 NOTE — Telephone Encounter (Signed)
Last filled 09-08-18 #30 Last OV Acute 06-27-18 Next OV 11-04-18 [place]

## 2018-10-29 ENCOUNTER — Telehealth: Payer: Self-pay

## 2018-10-29 NOTE — Telephone Encounter (Signed)
Pt said burning upon urination,feels urgency and then void small amt. No back pain. No covid symptoms except diarrhea for 2 days. No travel and no known exposure to + covid. Pt said cannot do a virtual visit; I tried to talk with pt about what type visits were available and pt said she would do phone visit which is scheduled 10/30/18 at 2 PM with Dr Silvio Pate. Pt said she could not afford to go to [facility] today. Pt will bring urine specimen to [facility] with car instructions at 11 AM. FYI to Dr Silvio Pate.

## 2018-10-30 ENCOUNTER — Ambulatory Visit (INDEPENDENT_AMBULATORY_CARE_PROVIDER_SITE_OTHER): Payer: PPO | Admitting: Internal Medicine

## 2018-10-30 ENCOUNTER — Other Ambulatory Visit: Payer: PPO

## 2018-10-30 ENCOUNTER — Encounter: Payer: Self-pay | Admitting: Internal Medicine

## 2018-10-30 VITALS — Wt 121.0 lb

## 2018-10-30 DIAGNOSIS — R300 Dysuria: Secondary | ICD-10-CM

## 2018-10-30 LAB — POC URINALSYSI DIPSTICK (AUTOMATED)
Bilirubin, UA: NEGATIVE
Blood, UA: NEGATIVE
Glucose, UA: NEGATIVE
Ketones, UA: NEGATIVE
Nitrite, UA: POSITIVE
Protein, UA: POSITIVE — AB
Spec Grav, UA: 1.015 (ref 1.010–1.025)
Urobilinogen, UA: 0.2 E.U./dL
pH, UA: 7 (ref 5.0–8.0)

## 2018-10-30 MED ORDER — AMOXICILLIN 500 MG PO TABS: 500.0000 mg | ORAL_TABLET | Freq: Three times a day (TID) | ORAL | 1 refills | Status: AC

## 2018-10-30 NOTE — Progress Notes (Signed)
Subjective:    Patient ID: KINLEE GARRISON, female    DOB: 1940-10-20, 78 y.o.   MRN: [id number]  HPI Visit due to dysuria  Interactive audio and video telecommunications were attempted between this provider and patient, however failed, due to patient having technical difficulties OR patient did not have access to video capability.  We continued and completed visit with audio only.   Virtual Visit via Telephone Note  I connected with Frederik Pear on 10/30/18 at  2:00 PM EDT by telephone and verified that I am speaking with the correct person using two identifiers.  Location: Patient: home Provider: office   I discussed the limitations, risks, security and privacy concerns of performing an evaluation and management service by telephone and the availability of in person appointments. I also discussed with the patient that there may be a patient responsible charge related to this service. The patient expressed understanding and agreed to proceed.   History of Present Illness: Having trouble with her stomach--chronic Has added some gaviscon to other medications  Now with 2 days of urinary symptoms---burning dysuria and increased frequency No fever No back pain No blood in the urine  No risk factor changes Upset about losing her dog Going daily to her brother's house to help him  Current Outpatient Medications on File Prior to Visit  Medication Sig Dispense Refill  . Cholecalciferol (VITAMIN D) 400 UNITS capsule Take 800 Units by mouth daily.    . diazepam (VALIUM) 10 MG tablet TAKE 1/2 TABLET BY MOUTH TWICE A DAY AS NEEDED FOR ANXIETY OR SLEEP 30 tablet 0  . losartan-hydrochlorothiazide (HYZAAR) 50-12.5 MG tablet TAKE 1 TABLET BY MOUTH ONCE A DAY 90 tablet 0  . vitamin C (ASCORBIC ACID) 500 MG tablet Take 500 mg by mouth daily.    . Omega-3 Fatty Acids (FISH OIL) 1000 MG CAPS Take 1,000 mg by mouth daily.     Marland Kitchen omeprazole (PRILOSEC) 20 MG capsule Take 1 capsule (20 mg total)  by mouth 2 (two) times daily before a meal. (Patient not taking: Reported on 10/30/2018) 180 capsule 3  . sucralfate (CARAFATE) 1 g tablet TAKE 1 TABLET BY MOUTH 4 TIMES DAILY WITH MEALS AND AT BEDTIME (Patient not taking: Reported on 10/30/2018) 120 tablet 11   No current facility-administered medications on file prior to visit.     Allergies  Allergen Reactions  . Sulfamethoxazole-Trimethoprim Other (See Comments)  . Morphine     agitation  . Morphine And Related Anxiety    Past Medical History:  Diagnosis Date  . Anxiety   . Cancer ([place]) 2015  . Depression   . Episodic mood disorder ([place])    mostly anxiety--depression in the past  . Gastric ulcer    History  . History of blood transfusion    At [facility] appro 15 yrs ago  . History of kidney stones 1960  . Hypertension   . Osteoarthritis, hand    hand  . Postmenopausal disorder   . SVD (spontaneous vaginal delivery)    x 3    Past Surgical History:  Procedure Laterality Date  . CHOLECYSTECTOMY  2008  . COLONOSCOPY  04/2011  . CYSTOCELE REPAIR N/A 12/31/2013   Procedure: ANTERIOR REPAIR (CYSTOCELE);  Surgeon: Donnamae Jude, MD;  Location: [facility];  Service: Gynecology;  Laterality: N/A;  . CYSTOSCOPY W/ URETERAL STENT PLACEMENT Left 01/16/2016   Procedure: CYSTOSCOPY WITH STENT REPLACEMENT;  Surgeon: Hollice Espy, MD;  Location: [facility];  Service:  Urology;  Laterality: Left;  . CYSTOSCOPY WITH STENT PLACEMENT Left 12/23/2015   Procedure: CYSTOSCOPY WITH STENT PLACEMENT;  Surgeon: Ardis Hughs, MD;  Location: [facility];  Service: Urology;  Laterality: Left;  . DIAGNOSTIC LAPAROSCOPY    . kidney stone removed    . MELANOMA EXCISION  2015   left posterior thigh  . TONSILLECTOMY  1944  . TUBAL LIGATION    . UPPER GI ENDOSCOPY    . URETEROSCOPY WITH HOLMIUM LASER LITHOTRIPSY Left 01/16/2016   Procedure: URETEROSCOPY WITH HOLMIUM LASER LITHOTRIPSY;  Surgeon: Hollice Espy, MD;  Location: [facility];   Service: Urology;  Laterality: Left;  . WISDOM TOOTH EXTRACTION      Family History  Problem Relation Age of Onset  . Alzheimer's disease Mother   . Alcohol abuse Father   . COPD Brother   . Heart disease Neg Hx   . Cancer Neg Hx     Social History   Socioeconomic History  . Marital status: Divorced    Spouse name: Not on file  . Number of children: 3  . Years of education: Not on file  . Highest education level: Not on file  Occupational History  . 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 Financial resource strain: Not on file  . Food insecurity    Worry: Not on file    Inability: Not on file  . Transportation needs    Medical: Not on file    Non-medical: Not on file  Tobacco Use  . Smoking status: Never Smoker  . Smokeless tobacco: Never Used  Substance and Sexual Activity  . Alcohol use: No    Alcohol/week: 0.0 standard drinks  . Drug use: No  . Sexual activity: Yes    Birth control/protection: Post-menopausal  Lifestyle  . Physical activity    Days per week: Not on file    Minutes per session: Not on file  . Stress: Not on file  Relationships  . Social Herbalist on phone: Not on file    Gets together: Not on file    Attends religious service: Not on file    Active member of club or organization: Not on file    Attends meetings of clubs or organizations: Not on file    Relationship status: Not on file  . Intimate partner violence    Fear of current or ex partner: Not on file    Emotionally abused: Not on file    Physically abused: Not on file    Forced sexual activity: Not on file  Other Topics Concern  . Not on file  Social History Narrative   3 sons--- middle one lives with her   [name]      Has living will   Son [name]. Then son [name]   Would accept resuscitation attempts but no prolonged ventilation   No tube feeds if cognitively unaware        Observations/Objective: Normal conversation No apparent pain  Assessment and Plan:  See problem list Follow Up Instructions:    I discussed the assessment and treatment plan with the patient. The patient was provided an opportunity to ask questions and all were answered. The patient agreed with the plan and demonstrated an understanding of the instructions.   The patient was advised to call back or seek an in-person evaluation if the symptoms worsen or if the condition fails  to improve as anticipated.  I provided 12 minutes of non-face-to-face time during this encounter.   Viviana Simpler, MD    Review of Systems     Objective:   Physical Exam         Assessment & Plan:

## 2018-10-30 NOTE — Telephone Encounter (Signed)
okay

## 2018-10-30 NOTE — Assessment & Plan Note (Signed)
3 times this year Symptoms suggestive of just cystitis at this time (again) Urinalysis does show leukocytes Culture had strep in March---will treat with 3 days of amoxil therefore. (with refill) She will let me know if her symptoms persist next week Advised starting daily cranberry tab for prevention Consider vaginal estrogen if recurs

## 2018-11-04 ENCOUNTER — Encounter: Payer: PPO | Admitting: Internal Medicine

## 2018-11-04 DIAGNOSIS — Z0289 Encounter for other administrative examinations: Secondary | ICD-10-CM

## 2018-11-17 ENCOUNTER — Other Ambulatory Visit: Payer: Self-pay | Admitting: Internal Medicine

## 2018-11-17 NOTE — Telephone Encounter (Signed)
Appointment made

## 2018-11-17 NOTE — Telephone Encounter (Signed)
Pt called b/c she is still experiencing UTI symptoms. She said it is worse, frequency every 20 min, bad burning. She has completed 2 rounds of amoxycilin  And does not feel like this is working for her. 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 She is requesting to have a different antibiotic called into [facility].

## 2018-11-17 NOTE — Telephone Encounter (Signed)
Patient needs to be seen for re-evaluation as Dr. Silvio Pate is out of the office.

## 2018-11-18 ENCOUNTER — Encounter: Payer: Self-pay | Admitting: Family Medicine

## 2018-11-18 ENCOUNTER — Other Ambulatory Visit: Payer: Self-pay

## 2018-11-18 ENCOUNTER — Ambulatory Visit (INDEPENDENT_AMBULATORY_CARE_PROVIDER_SITE_OTHER): Payer: PPO | Admitting: Family Medicine

## 2018-11-18 VITALS — BP 154/98 | HR 70 | Temp 98.5°F | Ht 60.5 in | Wt 120.5 lb

## 2018-11-18 DIAGNOSIS — N3000 Acute cystitis without hematuria: Secondary | ICD-10-CM

## 2018-11-18 DIAGNOSIS — R350 Frequency of micturition: Secondary | ICD-10-CM

## 2018-11-18 LAB — POCT URINALYSIS DIPSTICK
Bilirubin, UA: NEGATIVE
Glucose, UA: NEGATIVE
Ketones, UA: NEGATIVE
Nitrite, UA: POSITIVE
Protein, UA: NEGATIVE
Spec Grav, UA: 1.015 (ref 1.010–1.025)
Urobilinogen, UA: 0.2 E.U./dL
pH, UA: 6.5 (ref 5.0–8.0)

## 2018-11-18 MED ORDER — CEPHALEXIN 500 MG PO CAPS: 500.0000 mg | ORAL_CAPSULE | Freq: Two times a day (BID) | ORAL | 0 refills | Status: DC

## 2018-11-18 NOTE — Progress Notes (Signed)
Subjective:     Katie Bell is a 78 y.o. female presenting for Urinary Urgency (x 2 weeks. Frequency, burning with urination. Amoxicillin is not helping and made her sick on her stomach.)     Dysuria  This is a recurrent problem. The current episode started 1 to 4 weeks ago. The problem occurs every urination. The problem has been unchanged. The quality of the pain is described as burning. The pain is moderate. There has been no fever. Associated symptoms include frequency and urgency. Pertinent negatives include no discharge, flank pain, hematuria, hesitancy, nausea or vomiting. She has tried antibiotics for the symptoms. The treatment provided no relief.      Amoxicillin - prescribed TID x 3 days, but spaced it out to only taking 1 pill per day and has been taking the medication x 2 weeks  Review of Systems  Gastrointestinal: Negative for nausea and vomiting.  Genitourinary: Positive for dysuria, frequency and urgency. Negative for flank pain, hematuria and hesitancy.    Chart review: 06/27/2018 culture with contaminate  10/30/2018: UA with +nitrites and LE 06/27/2018: UA with LE neg nitrites 05/06/2018: UA with LE and neg nitrites 02/20/2017: culture with pansensitive e.coli   Social History   Tobacco Use  Smoking Status Never Smoker  Smokeless Tobacco Never Used        Objective:    BP Readings from Last 3 Encounters:  11/18/18 (!) 154/98  06/27/18 140/70  05/06/18 118/70   Wt Readings from Last 3 Encounters:  11/18/18 120 lb 8 oz (54.7 kg)  10/30/18 121 lb (54.9 kg)  06/27/18 125 lb 8 oz (56.9 kg)    BP (!) 154/98   Pulse 70   Temp 98.5 F (36.9 C)   Ht 5' 0.5" (1.537 m)   Wt 120 lb 8 oz (54.7 kg)   BMI 23.15 kg/m    Physical Exam Constitutional:      General: She is not in acute distress.    Appearance: She is well-developed. She is not diaphoretic.  HENT:     Right Ear: External ear normal.     Left Ear: External ear normal.     Nose: Nose  normal.  Eyes:     Conjunctiva/sclera: Conjunctivae normal.  Neck:     Musculoskeletal: Neck supple.  Cardiovascular:     Rate and Rhythm: Normal rate and regular rhythm.     Heart sounds: No murmur.  Pulmonary:     Effort: Pulmonary effort is normal. No respiratory distress.     Breath sounds: Normal breath sounds. No wheezing.  Abdominal:     General: Abdomen is flat. Bowel sounds are normal. There is no distension.     Palpations: Abdomen is soft.     Tenderness: There is no abdominal tenderness. There is no right CVA tenderness or left CVA tenderness.  Skin:    General: Skin is warm and dry.     Capillary Refill: Capillary refill takes less than 2 seconds.  Neurological:     Mental Status: She is alert. Mental status is at baseline.  Psychiatric:        Mood and Affect: Mood normal.        Behavior: Behavior normal.      UA: + LE and + nitrites     Assessment & Plan:   Problem List Items Addressed This Visit      Genitourinary   Acute cystitis without hematuria - Primary   Relevant Medications   cephALEXin (KEFLEX) 500  MG capsule   Other Relevant Orders   Urine Culture    Other Visit Diagnoses    Urinary frequency       Relevant Medications   cephALEXin (KEFLEX) 500 MG capsule   Other Relevant Orders   POCT urinalysis dipstick (Completed)     Discussed importance of taking abx as prescribed and calling if having severe side effects. Culture ordered to evaluate for resistance.   Return if symptoms worsen or fail to improve.  Lesleigh Noe, MD

## 2018-11-18 NOTE — Patient Instructions (Signed)
#  Urine infection - take the antibiotic - we will follow-up the urine culture - can take with Probiotic if causing some stomach upset

## 2018-11-20 ENCOUNTER — Other Ambulatory Visit: Payer: Self-pay | Admitting: Primary Care

## 2018-11-20 DIAGNOSIS — N3000 Acute cystitis without hematuria: Secondary | ICD-10-CM

## 2018-11-20 LAB — URINE CULTURE
MICRO NUMBER:: 711739
SPECIMEN QUALITY:: ADEQUATE

## 2018-11-20 MED ORDER — NITROFURANTOIN MONOHYD MACRO 100 MG PO CAPS: 100.0000 mg | ORAL_CAPSULE | Freq: Two times a day (BID) | ORAL | 0 refills | Status: DC

## 2018-11-21 ENCOUNTER — Telehealth: Payer: Self-pay

## 2018-11-21 NOTE — Telephone Encounter (Signed)
Her urine culture showed bacteria that will likely not respond to the antibiotic prescribed.  Stop cephalexin.  Start nitrofurantion (Macrobid). Take 1 capsule BID x 7 days.  Have her update [place] if no improvement in 3-4 days after taking new antibiotic.   Spoke to pt. Rx sent to [facility] by Anda Kraft.

## 2018-11-26 ENCOUNTER — Other Ambulatory Visit: Payer: Self-pay | Admitting: Internal Medicine

## 2018-11-28 ENCOUNTER — Telehealth: Payer: Self-pay

## 2018-11-28 NOTE — Telephone Encounter (Signed)
Pt concerned about elevated BP. Pt said she had h/a earlier today and 3:20 pm was at [facility] and had BP taken by manual cuff 165/86 P 112. Pharmacist was going to reck BP and was unable to x 2. Could not get cuff to work per pt. Pt took losartan-HCTZ 50-12.5 mg earlier this morning as usual; pt took another losartan HCTZ 50-12.5 mg at 3:45 this afternoon to get BP down. On 11/18/18 BP was 154/98 at a visit. Now no H/A, dizziness, CP or SOB.pt wants to know further instruction of what to do about BP. Pt does not have BP cuff at home that is accurate.Please advise. Dr Silvio Pate out of office. Pt has not missed any of her medicine. [place].sending note to Avie Echevaria NP.

## 2018-11-28 NOTE — Telephone Encounter (Signed)
Patient has appt with Dr Glori Bickers Monday at [place]

## 2018-11-28 NOTE — Telephone Encounter (Signed)
Would recommend eval Monday morning, ER over the weekend if worse.

## 2018-12-01 ENCOUNTER — Ambulatory Visit (INDEPENDENT_AMBULATORY_CARE_PROVIDER_SITE_OTHER): Payer: PPO | Admitting: Family Medicine

## 2018-12-01 ENCOUNTER — Encounter: Payer: Self-pay | Admitting: Family Medicine

## 2018-12-01 ENCOUNTER — Other Ambulatory Visit: Payer: Self-pay

## 2018-12-01 VITALS — BP 146/90 | HR 62 | Temp 98.2°F | Ht 60.5 in | Wt 122.2 lb

## 2018-12-01 DIAGNOSIS — I10 Essential (primary) hypertension: Secondary | ICD-10-CM | POA: Diagnosis not present

## 2018-12-01 MED ORDER — LOSARTAN POTASSIUM-HCTZ 100-25 MG PO TABS: 1.0000 | ORAL_TABLET | Freq: Every day | ORAL | 0 refills | Status: DC

## 2018-12-01 NOTE — Progress Notes (Signed)
Subjective:    Patient ID: Katie Bell, female    DOB: 1940/05/10, 78 y.o.   MRN: [id number]  HPI 78 yo pt of Dr Silvio Pate here for BP concerns   Wt Readings from Last 3 Encounters:  12/01/18 122 lb 3 oz (55.4 kg)  11/18/18 120 lb 8 oz (54.7 kg)  10/30/18 121 lb (54.9 kg)   23.47 kg/m   Not walking as much due to the heat (plans to re start)  Does not eat a lot of sodium  She avoids processed foods    Pt has h/o HTN  Has been checking at drugstore  Can't remember what her systolic was 086V  Diastolic 78I   She gets a "ringing" sensation in her head when it goes up  A little headache on L side- mild   She usually does not check her BP     BP Readings from Last 3 Encounters:  12/01/18 (!) 146/90  11/18/18 (!) 154/98  06/27/18 140/70   Takes losartan hct 50-12.5   Had a recent uti  Took a while to get on the right abx   Patient Active Problem List   Diagnosis Date Noted  . Viral conjunctivitis of left eye 06/27/2018  . Acute cystitis without hematuria 05/06/2018  . Dysuria 05/17/2017  . Visit for preventive health examination 02/16/2014  . Advanced directives, counseling/discussion 02/16/2014  . Female cystocele 11/02/2013  . Hypertension   . Gastric ulcer   . Episodic mood disorder ([place])   . Osteoarthritis, multiple sites    Past Medical History:  Diagnosis Date  . Anxiety   . Cancer ([place]) 2015  . Depression   . Episodic mood disorder ([place])    mostly anxiety--depression in the past  . Gastric ulcer    History  . History of blood transfusion    At [facility] appro 15 yrs ago  . History of kidney stones 1960  . Hypertension   . Osteoarthritis, hand    hand  . Postmenopausal disorder   . SVD (spontaneous vaginal delivery)    x 3   Past Surgical History:  Procedure Laterality Date  . CHOLECYSTECTOMY  2008  . COLONOSCOPY  04/2011  . CYSTOCELE REPAIR N/A 12/31/2013   Procedure: ANTERIOR REPAIR (CYSTOCELE);  Surgeon: Donnamae Jude, MD;  Location: [facility];  Service: Gynecology;  Laterality: N/A;  . CYSTOSCOPY W/ URETERAL STENT PLACEMENT Left 01/16/2016   Procedure: CYSTOSCOPY WITH STENT REPLACEMENT;  Surgeon: Hollice Espy, MD;  Location: [facility];  Service: Urology;  Laterality: Left;  . CYSTOSCOPY WITH STENT PLACEMENT Left 12/23/2015   Procedure: CYSTOSCOPY WITH STENT PLACEMENT;  Surgeon: Ardis Hughs, MD;  Location: [facility];  Service: Urology;  Laterality: Left;  . DIAGNOSTIC LAPAROSCOPY    . kidney stone removed    . MELANOMA EXCISION  2015   left posterior thigh  . TONSILLECTOMY  1944  . TUBAL LIGATION    . UPPER GI ENDOSCOPY    . URETEROSCOPY WITH HOLMIUM LASER LITHOTRIPSY Left 01/16/2016   Procedure: URETEROSCOPY WITH HOLMIUM LASER LITHOTRIPSY;  Surgeon: Hollice Espy, MD;  Location: [facility];  Service: Urology;  Laterality: Left;  . WISDOM TOOTH EXTRACTION     Social History   Tobacco Use  . Smoking status: Never Smoker  . Smokeless tobacco: Never Used  Substance Use Topics  . Alcohol use: No    Alcohol/week: 0.0 standard drinks  . Drug use: No   Family History  Problem Relation Age of  Onset  . Alzheimer's disease Mother   . Alcohol abuse Father   . COPD Brother   . Heart disease Neg Hx   . Cancer Neg Hx    Allergies  Allergen Reactions  . Sulfamethoxazole-Trimethoprim Other (See Comments)  . Morphine     agitation  . Morphine And Related Anxiety   Current Outpatient Medications on File Prior to Visit  Medication Sig Dispense Refill  . Cholecalciferol (VITAMIN D) 400 UNITS capsule Take 800 Units by mouth daily.    . diazepam (VALIUM) 10 MG tablet TAKE 1/2 TABLET BY MOUTH TWICE A DAY AS NEEDED FOR ANXIETY OR SLEEP 30 tablet 0  . Omega-3 Fatty Acids (FISH OIL) 1000 MG CAPS Take 1,000 mg by mouth daily.     . vitamin C (ASCORBIC ACID) 500 MG tablet Take 500 mg by mouth daily.     No current facility-administered medications on file prior to visit.      Review of Systems   Constitutional: Negative for activity change, appetite change, fatigue, fever and unexpected weight change.  HENT: Negative for congestion, ear pain, rhinorrhea, sinus pressure and sore throat.   Eyes: Negative for pain, redness and visual disturbance.  Respiratory: Negative for cough, shortness of breath and wheezing.   Cardiovascular: Negative for chest pain and palpitations.  Gastrointestinal: Negative for abdominal pain, blood in stool, constipation and diarrhea.  Endocrine: Negative for polydipsia and polyuria.  Genitourinary: Negative for dysuria, frequency and urgency.  Musculoskeletal: Negative for arthralgias, back pain and myalgias.  Skin: Negative for pallor and rash.  Allergic/Immunologic: Negative for environmental allergies.  Neurological: Positive for headaches. Negative for dizziness, tremors, syncope, facial asymmetry, weakness, light-headedness and numbness.  Hematological: Negative for adenopathy. Does not bruise/bleed easily.  Psychiatric/Behavioral: Negative for decreased concentration and dysphoric mood. The patient is nervous/anxious.        Objective:   Physical Exam Constitutional:      General: She is not in acute distress.    Appearance: Normal appearance. She is well-developed and normal weight. She is not ill-appearing.  HENT:     Head: Normocephalic and atraumatic.     Mouth/Throat:     Mouth: Mucous membranes are moist.  Eyes:     Conjunctiva/sclera: Conjunctivae normal.     Pupils: Pupils are equal, round, and reactive to light.  Neck:     Musculoskeletal: Normal range of motion and neck supple.     Thyroid: No thyromegaly.     Vascular: No carotid bruit or JVD.  Cardiovascular:     Rate and Rhythm: Normal rate and regular rhythm.     Heart sounds: Normal heart sounds. No gallop.   Pulmonary:     Effort: Pulmonary effort is normal. No respiratory distress.     Breath sounds: Normal breath sounds. No wheezing or rales.     Comments: Good air exch  Abdominal:     General: Bowel sounds are normal. There is no distension or abdominal bruit.  Lymphadenopathy:     Cervical: No cervical adenopathy.  Skin:    General: Skin is warm and dry.     Findings: No rash.  Neurological:     Mental Status: She is alert.     Coordination: Coordination normal.     Deep Tendon Reflexes: Reflexes are normal and symmetric. Reflexes normal.     Comments: No tremor  Psychiatric:        Mood and Affect: Mood is anxious.     Comments: Timid affect  Seems anxious  today           Assessment & Plan:   Problem List Items Addressed This Visit      Cardiovascular and Mediastinum   Hypertension - Primary    Pt has noticed a gradual increase in bp  She has also had headache/ringing in ear  Also notes being uncomfortable from recent uti and anxious about pandemic Disc taking bp when relaxed  BP: (!) 146/90    Will inc her losartan hct to 100-25  If dizzy or side eff inst to call  Disc lifestyle habits for HTN F/u with PCP in about 2 weeks (will need labs also)         Relevant Medications   losartan-hydrochlorothiazide (HYZAAR) 100-25 MG tablet

## 2018-12-01 NOTE — Assessment & Plan Note (Signed)
Pt has noticed a gradual increase in bp  She has also had headache/ringing in ear  Also notes being uncomfortable from recent uti and anxious about pandemic Disc taking bp when relaxed  BP: (!) 146/90    Will inc her losartan hct to 100-25  If dizzy or side eff inst to call  Disc lifestyle habits for HTN F/u with PCP in about 2 weeks (will need labs also)

## 2018-12-01 NOTE — Patient Instructions (Signed)
Try to eat healthy  Think about some exercise to do in the house until it cools down  Take care of yourself  Increase losartan-hct to 100-25 mg once daily     Avoid excess sodium  It you check your blood pressure at the drug store-make sure you are relaxed   Follow up with Dr Silvio Pate in about 2 weeks (you will need labs that day)

## 2018-12-04 ENCOUNTER — Other Ambulatory Visit: Payer: Self-pay | Admitting: Internal Medicine

## 2018-12-04 NOTE — Telephone Encounter (Signed)
Last filled 10-08-18 #30 Last OV Acute 12-01-18 Next OV 12-15-18 [facility]

## 2018-12-15 ENCOUNTER — Other Ambulatory Visit: Payer: Self-pay

## 2018-12-15 ENCOUNTER — Encounter: Payer: Self-pay | Admitting: Internal Medicine

## 2018-12-15 ENCOUNTER — Telehealth: Payer: Self-pay | Admitting: Radiology

## 2018-12-15 ENCOUNTER — Ambulatory Visit (INDEPENDENT_AMBULATORY_CARE_PROVIDER_SITE_OTHER): Payer: PPO | Admitting: Internal Medicine

## 2018-12-15 VITALS — BP 130/82 | HR 74 | Temp 98.2°F | Ht 61.0 in | Wt 121.0 lb

## 2018-12-15 DIAGNOSIS — I10 Essential (primary) hypertension: Secondary | ICD-10-CM | POA: Diagnosis not present

## 2018-12-15 DIAGNOSIS — S81811A Laceration without foreign body, right lower leg, initial encounter: Secondary | ICD-10-CM | POA: Diagnosis not present

## 2018-12-15 DIAGNOSIS — Z23 Encounter for immunization: Secondary | ICD-10-CM | POA: Diagnosis not present

## 2018-12-15 DIAGNOSIS — E871 Hypo-osmolality and hyponatremia: Secondary | ICD-10-CM

## 2018-12-15 DIAGNOSIS — F39 Unspecified mood [affective] disorder: Secondary | ICD-10-CM

## 2018-12-15 LAB — RENAL FUNCTION PANEL
Albumin: 4.8 g/dL (ref 3.5–5.2)
BUN: 15 mg/dL (ref 6–23)
CO2: 27 mEq/L (ref 19–32)
Calcium: 10.1 mg/dL (ref 8.4–10.5)
Chloride: 86 mEq/L — ABNORMAL LOW (ref 96–112)
Creatinine, Ser: 0.93 mg/dL (ref 0.40–1.20)
GFR: 58.11 mL/min — ABNORMAL LOW (ref >60.00)
Glucose, Bld: 109 mg/dL — ABNORMAL HIGH (ref 70–99)
Phosphorus: 3.3 mg/dL (ref 2.3–4.6)
Potassium: 3.5 mEq/L (ref 3.5–5.1)
Sodium: 121 mEq/L — CL (ref 135–145)

## 2018-12-15 NOTE — Telephone Encounter (Signed)
Please call her Her sodium level is very low (121) and the potassium is mildly low. This is likely due to the blood pressure medication (though surprising since they were normal on the lower dose last year). Have her come back in for repeat labs on Wednesday

## 2018-12-15 NOTE — Telephone Encounter (Signed)
Depending on the repeat and how her blood pressure is, we will decide about changing the medication after the follow up labs If she can check her BP off the med, that would be helpful

## 2018-12-15 NOTE — Assessment & Plan Note (Signed)
BP Readings from Last 3 Encounters:  12/15/18 130/82  12/01/18 (!) 146/90  11/18/18 (!) 154/98   Better control No problems with the higher dose Will check renal profile

## 2018-12-15 NOTE — Telephone Encounter (Signed)
[facility] called a critical NA - 121.3, results given to Dr Silvio Pate

## 2018-12-15 NOTE — Assessment & Plan Note (Addendum)
Minor cut from outdoor cat No infection

## 2018-12-15 NOTE — Telephone Encounter (Signed)
See additional note. 

## 2018-12-15 NOTE — Telephone Encounter (Signed)
Spoke to pt. Made her aware and set up lab appt. I told her we would wait for those results before making any changes.

## 2018-12-15 NOTE — Addendum Note (Signed)
Addended by: Pilar Grammes on: 12/15/2018 11:07 AM   Modules accepted: Orders

## 2018-12-15 NOTE — Assessment & Plan Note (Signed)
Mostly anxiety No MDD Uses the valium prn at night

## 2018-12-15 NOTE — Progress Notes (Signed)
Subjective:    Patient ID: Katie Bell, female    DOB: 09-Jun-1940, 78 y.o.   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 Cholecalciferol (VITAMIN D) 400 UNITS capsule Take 800 Units by mouth daily.    . diazepam (VALIUM) 10 MG tablet TAKE 0.5 TABLET (5 MG TOTAL) BY MOUTH TWICE DAILY AS NEEDED FOR ANXIETY OR SLEEP 30 tablet 0  . losartan-hydrochlorothiazide (HYZAAR) 100-25 MG tablet Take 1 tablet by mouth daily. 90 tablet 0  . Omega-3 Fatty Acids (FISH OIL) 1000 MG CAPS Take 1,000 mg by mouth daily.     . vitamin C (ASCORBIC ACID) 500 MG tablet Take 500 mg by mouth daily.     No current facility-administered medications on file prior to visit.     Allergies  Allergen Reactions  . Sulfamethoxazole-Trimethoprim Other (See Comments)  . Morphine     agitation  . Morphine And Related Anxiety    Past Medical History:  Diagnosis Date  . Anxiety   . Cancer ([place]) 2015  . Depression   . Episodic mood disorder ([place])    mostly anxiety--depression in the past  . Gastric ulcer    History  . History of blood transfusion    At [facility] appro 15 yrs ago  . History of kidney stones 1960  . Hypertension   . Osteoarthritis, hand    hand  . Postmenopausal disorder   . SVD (spontaneous vaginal delivery)    x 3    Past Surgical History:  Procedure Laterality Date  . CHOLECYSTECTOMY  2008  . COLONOSCOPY  04/2011  . CYSTOCELE REPAIR N/A 12/31/2013   Procedure: ANTERIOR REPAIR (CYSTOCELE);  Surgeon: Donnamae Jude, MD;  Location: [facility];  Service: Gynecology;   Laterality: N/A;  . CYSTOSCOPY W/ URETERAL STENT PLACEMENT Left 01/16/2016   Procedure: CYSTOSCOPY WITH STENT REPLACEMENT;  Surgeon: Hollice Espy, MD;  Location: [facility];  Service: Urology;  Laterality: Left;  . CYSTOSCOPY WITH STENT PLACEMENT Left 12/23/2015   Procedure: CYSTOSCOPY WITH STENT PLACEMENT;  Surgeon: Ardis Hughs, MD;  Location: [facility];  Service: Urology;  Laterality: Left;  . DIAGNOSTIC LAPAROSCOPY    . kidney stone removed    . MELANOMA EXCISION  2015   left posterior thigh  . TONSILLECTOMY  1944  . TUBAL LIGATION    . UPPER GI ENDOSCOPY    . URETEROSCOPY WITH HOLMIUM LASER LITHOTRIPSY Left 01/16/2016   Procedure: URETEROSCOPY WITH HOLMIUM LASER LITHOTRIPSY;  Surgeon: Hollice Espy, MD;  Location: [facility];  Service: Urology;  Laterality: Left;  . WISDOM TOOTH EXTRACTION      Family History  Problem Relation Age of Onset  . Alzheimer's disease Mother   . Alcohol abuse Father   . COPD Brother   . Heart disease Neg Hx   . Cancer Neg Hx     Social History   Socioeconomic History  . Marital status: Divorced    Spouse name: Not on file  . Number of children: 3  . Years of education: Not on file  .  Highest education level: Not on file  Occupational History  . 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 Financial resource strain: Not on file  . Food insecurity    Worry: Not on file    Inability: Not on file  . Transportation needs    Medical: Not on file    Non-medical: Not on file  Tobacco Use  . Smoking status: Never Smoker  . Smokeless tobacco: Never Used  Substance and Sexual Activity  . Alcohol use: No    Alcohol/week: 0.0 standard drinks  . Drug use: No  . Sexual activity: Yes    Birth control/protection: Post-menopausal  Lifestyle  . Physical activity    Days per week: Not on file    Minutes per session: Not on file  . Stress: Not on file  Relationships  . Social Herbalist on phone: Not  on file    Gets together: Not on file    Attends religious service: Not on file    Active member of club or organization: Not on file    Attends meetings of clubs or organizations: Not on file    Relationship status: Not on file  . Intimate partner violence    Fear of current or ex partner: Not on file    Emotionally abused: Not on file    Physically abused: Not on file    Forced sexual activity: Not on file  Other Topics Concern  . Not on file  Social History Narrative   3 sons--- middle one lives with her   [name]      Has living will   Son [name]. Then son [name]   Would accept resuscitation attempts but no prolonged ventilation   No tube feeds if cognitively unaware      Review of Systems Occasional mild headache--tylenol helps Appetite is okay Weight is stable Sleep is variable----she worries about things at times    Objective:   Physical Exam  Constitutional: She appears well-developed. No distress.  Neck: No thyromegaly present.  Cardiovascular: Normal rate, regular rhythm and normal heart sounds. Exam reveals no gallop.  No murmur heard. Respiratory: Effort normal and breath sounds normal. No respiratory distress. She has no wheezes. She has no rales.  Musculoskeletal:        General: No edema.  Lymphadenopathy:    She has no cervical adenopathy.  Skin:  Small granulating laceration on right medial lower calf No inflammation           Assessment & Plan:

## 2018-12-17 ENCOUNTER — Other Ambulatory Visit (INDEPENDENT_AMBULATORY_CARE_PROVIDER_SITE_OTHER): Payer: PPO

## 2018-12-17 ENCOUNTER — Telehealth: Payer: Self-pay | Admitting: Radiology

## 2018-12-17 ENCOUNTER — Other Ambulatory Visit: Payer: Self-pay

## 2018-12-17 DIAGNOSIS — E871 Hypo-osmolality and hyponatremia: Secondary | ICD-10-CM

## 2018-12-17 LAB — RENAL FUNCTION PANEL
Albumin: 4.5 g/dL (ref 3.5–5.2)
BUN: 12 mg/dL (ref 6–23)
CO2: 22 mEq/L (ref 19–32)
Calcium: 9.6 mg/dL (ref 8.4–10.5)
Chloride: 87 mEq/L — ABNORMAL LOW (ref 96–112)
Creatinine, Ser: 0.92 mg/dL (ref 0.40–1.20)
GFR: 58.84 mL/min — ABNORMAL LOW (ref >60.00)
Glucose, Bld: 152 mg/dL — ABNORMAL HIGH (ref 70–99)
Phosphorus: 2.8 mg/dL (ref 2.3–4.6)
Potassium: 3.3 mEq/L — ABNORMAL LOW (ref 3.5–5.1)
Sodium: 120 mEq/L — CL (ref 135–145)

## 2018-12-17 NOTE — Telephone Encounter (Signed)
[facility] called a critical NA - 120.0, results given to Dr Danise Mina

## 2018-12-17 NOTE — Telephone Encounter (Signed)
Noted. Also present earlier in the week. Will defer to PCP.

## 2018-12-18 NOTE — Addendum Note (Signed)
Addended by: Viviana Simpler I on: 12/18/2018 12:58 PM   Modules accepted: Orders

## 2018-12-18 NOTE — Telephone Encounter (Signed)
Phone call with her She didn't stop the BP med--so that may be why it hasn't come up at all Doesn't feel great but no specific symptoms Discussed 911 if she has any serious issues  Will try to set up urgent nephrology evaluation

## 2018-12-30 ENCOUNTER — Encounter: Payer: Self-pay | Admitting: Emergency Medicine

## 2018-12-30 ENCOUNTER — Inpatient Hospital Stay
Admission: EM | Admit: 2018-12-30 | Discharge: 2018-12-31 | DRG: 872 | Disposition: A | Discharge status: Home / Self Care | Payer: PPO | Attending: Internal Medicine | Admitting: Internal Medicine

## 2018-12-30 ENCOUNTER — Other Ambulatory Visit: Payer: Self-pay

## 2018-12-30 DIAGNOSIS — Z9049 Acquired absence of other specified parts of digestive tract: Secondary | ICD-10-CM | POA: Diagnosis not present

## 2018-12-30 DIAGNOSIS — Z811 Family history of alcohol abuse and dependence: Secondary | ICD-10-CM

## 2018-12-30 DIAGNOSIS — N390 Urinary tract infection, site not specified: Secondary | ICD-10-CM | POA: Diagnosis present

## 2018-12-30 DIAGNOSIS — Z825 Family history of asthma and other chronic lower respiratory diseases: Secondary | ICD-10-CM

## 2018-12-30 DIAGNOSIS — I10 Essential (primary) hypertension: Secondary | ICD-10-CM | POA: Diagnosis not present

## 2018-12-30 DIAGNOSIS — Z820 Family history of epilepsy and other diseases of the nervous system: Secondary | ICD-10-CM

## 2018-12-30 DIAGNOSIS — F329 Major depressive disorder, single episode, unspecified: Secondary | ICD-10-CM | POA: Diagnosis not present

## 2018-12-30 DIAGNOSIS — A419 Sepsis, unspecified organism: Principal | ICD-10-CM | POA: Diagnosis present

## 2018-12-30 DIAGNOSIS — M19049 Primary osteoarthritis, unspecified hand: Secondary | ICD-10-CM | POA: Diagnosis present

## 2018-12-30 DIAGNOSIS — Z03818 Encounter for observation for suspected exposure to other biological agents ruled out: Secondary | ICD-10-CM | POA: Diagnosis not present

## 2018-12-30 DIAGNOSIS — Z8711 Personal history of peptic ulcer disease: Secondary | ICD-10-CM

## 2018-12-30 DIAGNOSIS — Z87442 Personal history of urinary calculi: Secondary | ICD-10-CM | POA: Diagnosis not present

## 2018-12-30 DIAGNOSIS — E871 Hypo-osmolality and hyponatremia: Secondary | ICD-10-CM | POA: Diagnosis present

## 2018-12-30 DIAGNOSIS — E876 Hypokalemia: Secondary | ICD-10-CM | POA: Diagnosis not present

## 2018-12-30 DIAGNOSIS — Z20828 Contact with and (suspected) exposure to other viral communicable diseases: Secondary | ICD-10-CM | POA: Diagnosis present

## 2018-12-30 DIAGNOSIS — Z8619 Personal history of other infectious and parasitic diseases: Secondary | ICD-10-CM

## 2018-12-30 DIAGNOSIS — F419 Anxiety disorder, unspecified: Secondary | ICD-10-CM | POA: Diagnosis present

## 2018-12-30 LAB — COMPREHENSIVE METABOLIC PANEL
ALT: 15 U/L (ref 0–44)
AST: 20 U/L (ref 15–41)
Albumin: 4.7 g/dL (ref 3.5–5.0)
Alkaline Phosphatase: 46 U/L (ref 38–126)
Anion gap: 12 (ref 5–15)
BUN: 16 mg/dL (ref 8–23)
CO2: 23 mmol/L (ref 22–32)
Calcium: 9.8 mg/dL (ref 8.9–10.3)
Chloride: 91 mmol/L — ABNORMAL LOW (ref 98–111)
Creatinine, Ser: 0.89 mg/dL (ref 0.44–1.00)
GFR calc Af Amer: >60 mL/min (ref >60)
GFR calc non Af Amer: >60 mL/min (ref >60)
Glucose, Bld: 113 mg/dL — ABNORMAL HIGH (ref 70–99)
Potassium: 2.9 mmol/L — ABNORMAL LOW (ref 3.5–5.1)
Sodium: 126 mmol/L — ABNORMAL LOW (ref 135–145)
Total Bilirubin: 1.1 mg/dL (ref 0.3–1.2)
Total Protein: 8 g/dL (ref 6.5–8.1)

## 2018-12-30 LAB — CBC WITH DIFFERENTIAL/PLATELET
Abs Immature Granulocytes: 0.02 10*3/uL (ref 0.00–0.07)
Basophils Absolute: 0.1 10*3/uL (ref 0.0–0.1)
Basophils Relative: 1 %
Eosinophils Absolute: 0.1 10*3/uL (ref 0.0–0.5)
Eosinophils Relative: 2 %
HCT: 41.2 % (ref 36.0–46.0)
Hemoglobin: 14.5 g/dL (ref 12.0–15.0)
Immature Granulocytes: 0 %
Lymphocytes Relative: 32 %
Lymphs Abs: 2.1 10*3/uL (ref 0.7–4.0)
MCH: 31 pg (ref 26.0–34.0)
MCHC: 35.2 g/dL (ref 30.0–36.0)
MCV: 88.2 fL (ref 80.0–100.0)
Monocytes Absolute: 0.9 10*3/uL (ref 0.1–1.0)
Monocytes Relative: 14 %
Neutro Abs: 3.3 10*3/uL (ref 1.7–7.7)
Neutrophils Relative %: 51 %
Platelets: 249 10*3/uL (ref 150–400)
RBC: 4.67 MIL/uL (ref 3.87–5.11)
RDW: 11 % — ABNORMAL LOW (ref 11.5–15.5)
WBC: 6.4 10*3/uL (ref 4.0–10.5)
nRBC: 0 % (ref 0.0–0.2)

## 2018-12-30 LAB — SODIUM, URINE, RANDOM: Sodium, Ur: 46 mmol/L

## 2018-12-30 LAB — URINALYSIS, COMPLETE (UACMP) WITH MICROSCOPIC
Bilirubin Urine: NEGATIVE
Glucose, UA: NEGATIVE mg/dL
Hgb urine dipstick: NEGATIVE
Ketones, ur: NEGATIVE mg/dL
Nitrite: NEGATIVE
Protein, ur: NEGATIVE mg/dL
Specific Gravity, Urine: 1.013 (ref 1.005–1.030)
pH: 6 (ref 5.0–8.0)

## 2018-12-30 LAB — MAGNESIUM: Magnesium: 2.2 mg/dL (ref 1.7–2.4)

## 2018-12-30 LAB — SARS CORONAVIRUS 2 BY RT PCR (HOSPITAL ORDER, PERFORMED IN ~~LOC~~ HOSPITAL LAB): SARS Coronavirus 2: NEGATIVE

## 2018-12-30 LAB — POTASSIUM: Potassium: 3.4 mmol/L — ABNORMAL LOW (ref 3.5–5.1)

## 2018-12-30 LAB — TSH: TSH: 2.478 u[IU]/mL (ref 0.350–4.500)

## 2018-12-30 LAB — OSMOLALITY: Osmolality: 267 mOsm/kg — ABNORMAL LOW (ref 275–295)

## 2018-12-30 LAB — OSMOLALITY, URINE: Osmolality, Ur: 345 mOsm/kg (ref 300–900)

## 2018-12-30 MED ORDER — SODIUM CHLORIDE 0.9 % IV SOLN
1.0000 g | Freq: Once | INTRAVENOUS | Status: AC
  Administered 2018-12-30: 1 g via INTRAVENOUS
  Filled 2018-12-30: qty 10

## 2018-12-30 MED ORDER — ACETAMINOPHEN 650 MG RE SUPP
650.0000 mg | Freq: Four times a day (QID) | RECTAL | Status: DC | PRN
  Filled 2018-12-30: qty 1

## 2018-12-30 MED ORDER — HYDROCODONE-ACETAMINOPHEN 5-325 MG PO TABS
1.0000 | ORAL_TABLET | ORAL | Status: DC | PRN
  Administered 2018-12-30: 2 via ORAL
  Filled 2018-12-30: qty 2

## 2018-12-30 MED ORDER — POTASSIUM CHLORIDE CRYS ER 20 MEQ PO TBCR
20.0000 meq | EXTENDED_RELEASE_TABLET | Freq: Once | ORAL | Status: AC
  Administered 2018-12-30: 20 meq via ORAL
  Filled 2018-12-30: qty 1

## 2018-12-30 MED ORDER — ACETAMINOPHEN 325 MG PO TABS: 650.0000 mg | ORAL_TABLET | Freq: Four times a day (QID) | ORAL | Status: DC | PRN

## 2018-12-30 MED ORDER — ENOXAPARIN SODIUM 40 MG/0.4ML ~~LOC~~ SOLN: 40.0000 mg | SUBCUTANEOUS | Status: DC

## 2018-12-30 MED ORDER — ONDANSETRON HCL 4 MG/2ML IJ SOLN: 4.0000 mg | Freq: Four times a day (QID) | INTRAMUSCULAR | Status: DC | PRN

## 2018-12-30 MED ORDER — CHOLECALCIFEROL 10 MCG (400 UNIT) PO TABS
800.0000 [IU] | ORAL_TABLET | Freq: Every day | ORAL | Status: DC
  Administered 2018-12-30 – 2018-12-31 (×2): 800 [IU] via ORAL
  Filled 2018-12-30 (×2): qty 2

## 2018-12-30 MED ORDER — VITAMIN C 500 MG PO TABS
500.0000 mg | ORAL_TABLET | Freq: Every day | ORAL | Status: DC
  Administered 2018-12-30 – 2018-12-31 (×2): 500 mg via ORAL
  Filled 2018-12-30 (×2): qty 1

## 2018-12-30 MED ORDER — POLYETHYLENE GLYCOL 3350 17 G PO PACK
17.0000 g | PACK | Freq: Every day | ORAL | Status: DC | PRN
  Filled 2018-12-30: qty 1

## 2018-12-30 MED ORDER — POTASSIUM CHLORIDE IN NACL 20-0.9 MEQ/L-% IV SOLN
Freq: Once | INTRAVENOUS | Status: AC
  Administered 2018-12-30: 10:00:00 via INTRAVENOUS
  Filled 2018-12-30: qty 1000

## 2018-12-30 MED ORDER — SODIUM CHLORIDE 0.9 % IV SOLN
INTRAVENOUS | Status: DC
  Administered 2018-12-30 – 2018-12-31 (×2): via INTRAVENOUS

## 2018-12-30 MED ORDER — ASPIRIN EC 81 MG PO TBEC
81.0000 mg | DELAYED_RELEASE_TABLET | Freq: Every day | ORAL | Status: DC
  Administered 2018-12-30 – 2018-12-31 (×2): 81 mg via ORAL
  Filled 2018-12-30 (×2): qty 1

## 2018-12-30 MED ORDER — ONDANSETRON HCL 4 MG PO TABS
4.0000 mg | ORAL_TABLET | Freq: Four times a day (QID) | ORAL | Status: DC | PRN
  Filled 2018-12-30: qty 1

## 2018-12-30 MED ORDER — POTASSIUM CHLORIDE CRYS ER 20 MEQ PO TBCR
40.0000 meq | EXTENDED_RELEASE_TABLET | Freq: Once | ORAL | Status: AC
  Administered 2018-12-30: 40 meq via ORAL
  Filled 2018-12-30: qty 2

## 2018-12-30 MED ORDER — ENOXAPARIN SODIUM 40 MG/0.4ML ~~LOC~~ SOLN
40.0000 mg | SUBCUTANEOUS | Status: DC
  Administered 2018-12-30: 23:00:00 40 mg via SUBCUTANEOUS
  Filled 2018-12-30 (×2): qty 0.4

## 2018-12-30 MED ORDER — DIAZEPAM 2 MG PO TABS: 2.0000 mg | ORAL_TABLET | Freq: Four times a day (QID) | ORAL | Status: DC | PRN

## 2018-12-30 MED ORDER — SODIUM CHLORIDE 0.9 % IV SOLN
1.0000 g | INTRAVENOUS | Status: DC
  Administered 2018-12-31: 1 g via INTRAVENOUS
  Filled 2018-12-30: qty 1

## 2018-12-30 NOTE — ED Notes (Signed)
Pt given a meal tray at this time and sitting in bed comfortably. NAD noted. Call bell in reach and pt verbalized understanding to call nurse if needed.

## 2018-12-30 NOTE — H&P (Signed)
[facility] [place] NAME: Katie Bell    MR#:  [id number]  DATE OF BIRTH:  1940/11/17  DATE OF ADMISSION:  12/30/2018  PRIMARY CARE PHYSICIAN: Venia Carbon, MD   REQUESTING/REFERRING PHYSICIAN: dr  Joan Mayans  CHIEF COMPLAINT:   weakness HISTORY OF PRESENT ILLNESS:  Katie Bell  is a 78 y.o. female with a known history of did the emergency room due to generalized weakness.  Patient reports she has had generalized weakness for several days.  She was found to have hyponatremia a few days ago.  She reports that some adjustments to her medications were made.  She also endorses dysuria, frequency and urgency.  She is found to have urinary tract infection.  Sodium level has improved slightly since 2 days ago.  PAST MEDICAL HISTORY:   Past Medical History:  Diagnosis Date  . Anxiety   . Cancer ([place]) 2015  . Depression   . Episodic mood disorder ([place])    mostly anxiety--depression in the past  . Gastric ulcer    History  . History of blood transfusion    At [facility] appro 15 yrs ago  . History of kidney stones 1960  . Hypertension   . Osteoarthritis, hand    hand  . Postmenopausal disorder   . SVD (spontaneous vaginal delivery)    x 3    PAST SURGICAL HISTORY:   Past Surgical History:  Procedure Laterality Date  . CHOLECYSTECTOMY  2008  . COLONOSCOPY  04/2011  . CYSTOCELE REPAIR N/A 12/31/2013   Procedure: ANTERIOR REPAIR (CYSTOCELE);  Surgeon: Donnamae Jude, MD;  Location: [facility];  Service: Gynecology;  Laterality: N/A;  . CYSTOSCOPY W/ URETERAL STENT PLACEMENT Left 01/16/2016   Procedure: CYSTOSCOPY WITH STENT REPLACEMENT;  Surgeon: Hollice Espy, MD;  Location: [facility];  Service: Urology;  Laterality: Left;  . CYSTOSCOPY WITH STENT PLACEMENT Left 12/23/2015   Procedure: CYSTOSCOPY WITH STENT PLACEMENT;  Surgeon: Ardis Hughs, MD;  Location: [facility];  Service: Urology;  Laterality: Left;  . DIAGNOSTIC  LAPAROSCOPY    . kidney stone removed    . MELANOMA EXCISION  2015   left posterior thigh  . TONSILLECTOMY  1944  . TUBAL LIGATION    . UPPER GI ENDOSCOPY    . URETEROSCOPY WITH HOLMIUM LASER LITHOTRIPSY Left 01/16/2016   Procedure: URETEROSCOPY WITH HOLMIUM LASER LITHOTRIPSY;  Surgeon: Hollice Espy, MD;  Location: [facility];  Service: Urology;  Laterality: Left;  . WISDOM TOOTH EXTRACTION      SOCIAL HISTORY:   Social History   Tobacco Use  . Smoking status: Never Smoker  . Smokeless tobacco: Never Used  Substance Use Topics  . Alcohol use: No    Alcohol/week: 0.0 standard drinks    FAMILY HISTORY:   Family History  Problem Relation Age of Onset  . Alzheimer's disease Mother   . Alcohol abuse Father   . COPD Brother   . Heart disease Neg Hx   . Cancer Neg Hx     DRUG ALLERGIES:   Allergies  Allergen Reactions  . Sulfamethoxazole-Trimethoprim Other (See Comments)  . Morphine     agitation  . Morphine And Related Anxiety    REVIEW OF SYSTEMS:   Review of Systems  Constitutional: Positive for malaise/fatigue. Negative for chills and fever.  HENT: Negative.  Negative for ear discharge, ear pain, hearing loss, nosebleeds and sore throat.   Eyes: Negative.  Negative for blurred vision and pain.  Respiratory: Negative.  Negative for cough, hemoptysis, shortness of breath and wheezing.   Cardiovascular: Negative.  Negative for chest pain, palpitations and leg swelling.  Gastrointestinal: Negative.  Negative for abdominal pain, blood in stool, diarrhea, nausea and vomiting.  Genitourinary: Positive for dysuria, frequency and urgency.  Musculoskeletal: Negative.  Negative for back pain.  Skin: Negative.   Neurological: Negative for dizziness, tremors, speech change, focal weakness, seizures and headaches.  Endo/Heme/Allergies: Negative.  Does not bruise/bleed easily.  Psychiatric/Behavioral: Negative.  Negative for depression, hallucinations and suicidal ideas.     MEDICATIONS AT HOME:   Prior to Admission medications   Medication Sig Start Date End Date Taking? Authorizing Provider  Cholecalciferol (VITAMIN D) 400 UNITS capsule Take 800 Units by mouth daily.   Yes [provider]  diazepam (VALIUM) 10 MG tablet TAKE 0.5 TABLET (5 MG TOTAL) BY MOUTH TWICE DAILY AS NEEDED FOR ANXIETY OR SLEEP 12/04/18  Yes Venia Carbon, MD  losartan-hydrochlorothiazide (HYZAAR) 100-25 MG tablet Take 1 tablet by mouth daily. 12/01/18  Yes Tower, Wynelle Fanny, MD  Omega-3 Fatty Acids (FISH OIL) 1000 MG CAPS Take 1,000 mg by mouth daily.    Yes [provider]  vitamin C (ASCORBIC ACID) 500 MG tablet Take 500 mg by mouth daily.   Yes [provider]      VITAL SIGNS:  Blood pressure (!) 154/67, pulse 80, temperature 98.3 F (36.8 C), temperature source Oral, resp. rate 18, height 5\' 1"  (1.549 m), weight 52.2 kg, SpO2 96 %.  PHYSICAL EXAMINATION:   Physical Exam Constitutional:      General: She is not in acute distress. HENT:     Head: Normocephalic.  Eyes:     General: No scleral icterus. Neck:     Musculoskeletal: Normal range of motion and neck supple.     Vascular: No JVD.     Trachea: No tracheal deviation.  Cardiovascular:     Rate and Rhythm: Normal rate and regular rhythm.     Heart sounds: Normal heart sounds. No murmur. No friction rub. No gallop.   Pulmonary:     Effort: Pulmonary effort is normal. No respiratory distress.     Breath sounds: Normal breath sounds. No wheezing or rales.  Chest:     Chest wall: No tenderness.  Abdominal:     General: Bowel sounds are normal. There is no distension.     Palpations: Abdomen is soft. There is no mass.     Tenderness: There is no abdominal tenderness. There is no guarding or rebound.  Musculoskeletal: Normal range of motion.  Skin:    General: Skin is warm.     Findings: No erythema or rash.  Neurological:     Mental Status: She is alert and oriented to person, place,  and time.  Psychiatric:        Judgment: Judgment normal.       LABORATORY PANEL:   CBC Recent Labs  Lab 12/30/18 0414  WBC 6.4  HGB 14.5  HCT 41.2  PLT 249   ------------------------------------------------------------------------------------------------------------------  Chemistries  Recent Labs  Lab 12/30/18 0414  NA 126*  K 2.9*  CL 91*  CO2 23  GLUCOSE 113*  BUN 16  CREATININE 0.89  CALCIUM 9.8  MG 2.2  AST 20  ALT 15  ALKPHOS 46  BILITOT 1.1   ------------------------------------------------------------------------------------------------------------------  Cardiac Enzymes No results for input(s): TROPONINI in the last 168 hours. ------------------------------------------------------------------------------------------------------------------  RADIOLOGY:  No results found.  EKG:   Orders  placed or performed during the hospital encounter of 01/05/16  . EKG 12-Lead  . EKG 12-Lead    IMPRESSION AND PLAN:   78 year old female with hypertension who presents emergency room with generalized weakness.  1.  Hyponatremia: This is etiology of patient's generalized weakness.  Hyponatremia in part due to hypertensive medications as well as poor p.o. intake and UTI.  Continue IV fluids and repeat BMP in a.m. Stop HCTZ for now  2.  UTI: Continue Rocephin and follow-up on urine culture  3.  Essential hypertension: Hold losartan/HCTZ for now.  PRN hydralazine can be ordered.  4  Hypokalemia: Pharmacy to replete  All the records are reviewed and case discussed with ED provider. Management plans discussed with the patient and she is in agreement  CODE STATUS: FULL  TOTAL TIME TAKING CARE OF THIS PATIENT: 43 minutes.    Bettey Costa M.D on 12/30/2018 at 11:54 AM  Between 7am to 6pm - Pager - [phone]  After 6pm go to [email or web address] - password [email or web address] [facility]  Office  [phone]  CC: Primary care physician; Venia Carbon, MD

## 2018-12-30 NOTE — ED Notes (Signed)
Pt up to use bathroom with steady gait and nonskid socks on

## 2018-12-30 NOTE — ED Notes (Signed)
Dr. Mody at bedside.  

## 2018-12-30 NOTE — ED Notes (Signed)
IV access attempted x 2 by this RN  IV access attempted x 2 by Jonni Sanger, EMT-P  IV team consult placed. Dr. Joan Mayans aware

## 2018-12-30 NOTE — ED Provider Notes (Signed)
[facility] Emergency Department Provider Note  ____________________________________________   First MD Initiated Contact with Patient 12/30/18 [phone]     (approximate)  I have reviewed the triage vital signs and the nursing notes.  History  Chief Complaint Generalized Body Aches    HPI Katie KETTLEWELL is a 78 y.o. female hx of anxiety, HTN who presents for generalized body pain, generalized weakness, as well as urinary frequency and dysuria. Patient states her generalized body pains and weakness has been ongoing for around 1 week, but has worsened over the last few days.  She also reports urinary frequency, as well as dysuria that is been going on over the same time period.  She denies any back or flank pain.  No fevers, nausea, vomiting.  Symptoms are consistent with prior episodes of UTI.  Recently saw in her PCP for blood pressure check, and found to be hyponatremic to 120 on 8/26.  Some adjustments were made to her blood pressure medication (losartan-HCTZ), but she states she has continued to feel symptomatically weak and tired. She was instructed to make a nephrology appointment, but is unable to go until the end of the month. She does report decreased PO due to lack of appetite.          Past Medical Hx Past Medical History:  Diagnosis Date  . Anxiety   . Cancer ([place]) 2015  . Depression   . Episodic mood disorder ([place])    mostly anxiety--depression in the past  . Gastric ulcer    History  . History of blood transfusion    At [facility] appro 15 yrs ago  . History of kidney stones 1960  . Hypertension   . Osteoarthritis, hand    hand  . Postmenopausal disorder   . SVD (spontaneous vaginal delivery)    x 3    Problem List Patient Active Problem List   Diagnosis Date Noted  . Laceration of right leg excluding thigh, initial encounter 12/15/2018  . Viral conjunctivitis of left eye 06/27/2018  . Acute cystitis without  hematuria 05/06/2018  . Dysuria 05/17/2017  . Visit for preventive health examination 02/16/2014  . Advanced directives, counseling/discussion 02/16/2014  . Female cystocele 11/02/2013  . Hypertension   . Gastric ulcer   . Episodic mood disorder ([place])   . Osteoarthritis, multiple sites     Past Surgical Hx Past Surgical History:  Procedure Laterality Date  . CHOLECYSTECTOMY  2008  . COLONOSCOPY  04/2011  . CYSTOCELE REPAIR N/A 12/31/2013   Procedure: ANTERIOR REPAIR (CYSTOCELE);  Surgeon: Donnamae Jude, MD;  Location: [facility];  Service: Gynecology;  Laterality: N/A;  . CYSTOSCOPY W/ URETERAL STENT PLACEMENT Left 01/16/2016   Procedure: CYSTOSCOPY WITH STENT REPLACEMENT;  Surgeon: Hollice Espy, MD;  Location: [facility];  Service: Urology;  Laterality: Left;  . CYSTOSCOPY WITH STENT PLACEMENT Left 12/23/2015   Procedure: CYSTOSCOPY WITH STENT PLACEMENT;  Surgeon: Ardis Hughs, MD;  Location: [facility];  Service: Urology;  Laterality: Left;  . DIAGNOSTIC LAPAROSCOPY    . kidney stone removed    . MELANOMA EXCISION  2015   left posterior thigh  . TONSILLECTOMY  1944  . TUBAL LIGATION    . UPPER GI ENDOSCOPY    . URETEROSCOPY WITH HOLMIUM LASER LITHOTRIPSY Left 01/16/2016   Procedure: URETEROSCOPY WITH HOLMIUM LASER LITHOTRIPSY;  Surgeon: Hollice Espy, MD;  Location: [facility];  Service: Urology;  Laterality: Left;  . WISDOM TOOTH EXTRACTION  Medications Prior to Admission medications   Medication Sig Start Date End Date Taking? Authorizing Provider  Cholecalciferol (VITAMIN D) 400 UNITS capsule Take 800 Units by mouth daily.    [provider]  diazepam (VALIUM) 10 MG tablet TAKE 0.5 TABLET (5 MG TOTAL) BY MOUTH TWICE DAILY AS NEEDED FOR ANXIETY OR SLEEP 12/04/18   Venia Carbon, MD  losartan-hydrochlorothiazide (HYZAAR) 100-25 MG tablet Take 1 tablet by mouth daily. 12/01/18   Tower, Wynelle Fanny, MD  Omega-3 Fatty Acids (FISH OIL) 1000 MG CAPS Take 1,000 mg by mouth  daily.     [provider]  vitamin C (ASCORBIC ACID) 500 MG tablet Take 500 mg by mouth daily.    [provider]    Allergies Sulfamethoxazole-trimethoprim, Morphine, and Morphine and related  Family Hx Family History  Problem Relation Age of Onset  . Alzheimer's disease Mother   . Alcohol abuse Father   . COPD Brother   . Heart disease Neg Hx   . Cancer Neg Hx     Social Hx Social History   Tobacco Use  . Smoking status: Never Smoker  . Smokeless tobacco: Never Used  Substance Use Topics  . Alcohol use: No    Alcohol/week: 0.0 standard drinks  . Drug use: No     Review of Systems  Constitutional: Negative for fever. Negative for chills. + generalized aches, pains Eyes: Negative for visual changes. ENT: Negative for sore throat. Cardiovascular: Negative for chest pain. Respiratory: Negative for shortness of breath. Gastrointestinal: Negative for abdominal pain. Negative for nausea. Negative for vomiting. Genitourinary: + for dysuria, urinary frequency. Musculoskeletal: Negative for leg swelling. Skin: Negative for rash. Neurological: Negative for for headaches.   Physical Exam  Vital Signs: ED Triage Vitals  Enc Vitals Group     BP 12/30/18 0401 (!) 155/74     Pulse Rate 12/30/18 0401 71     Resp 12/30/18 0401 18     Temp 12/30/18 0401 97.9 F (36.6 C)     Temp Source 12/30/18 0401 Oral     SpO2 12/30/18 0401 97 %     Weight 12/30/18 0406 115 lb (52.2 kg)     Height 12/30/18 0406 5\' 1"  (1.549 m)     Head Circumference --      Peak Flow --      Pain Score 12/30/18 0405 6     Pain Loc --      Pain Edu? --      Excl. in [place]? --     Constitutional: Alert and oriented x 4.  Eyes: Conjunctivae clear. Sclera anicteric. Head: Normocephalic. Atraumatic. Nose: No congestion. No rhinorrhea. Mouth/Throat: Mucous membranes are moist.  Neck: No stridor.   Cardiovascular: Normal rate, regular rhythm. No murmurs. Extremities well perfused.  Respiratory: Normal respiratory effort.  Lungs CTAB. Gastrointestinal: Soft and non-tender. No distention.  Musculoskeletal: No lower extremity edema. Neurologic:  Normal speech and language. No gross focal neurologic deficits are appreciated. UE and LE strength 5/5 and symmetric.  Skin: Skin is warm, dry and intact. No rash noted. Psychiatric: Mood and affect are appropriate for situation.  EKG  N/A    Radiology  N/A   Procedures  Procedure(s) performed (including critical care):  .Critical Care Performed by: Lilia Pro., MD Authorized by: Lilia Pro., MD   Critical care provider statement:    Critical care time (minutes):  30   Critical care was necessary to treat or prevent imminent or life-threatening deterioration of  the following conditions: electrolyte disturbance.   Critical care was time spent personally by me on the following activities:  Evaluation of patient's response to treatment, examination of patient, ordering and performing treatments and interventions, ordering and review of laboratory studies, ordering and review of radiographic studies, pulse oximetry, re-evaluation of patient's condition, obtaining history from patient or surrogate and review of old charts     Initial Impression / Assessment and Plan / ED Course  78 y.o. female who presents to the ED for generalized weakness, pains, as well as dysuria and urinary frequency.  Ddx includes UTI, electrolyte abnormalities, thiazide induced hypoNa, decreased PO induced electrolyte abnormalities  UA w/ LE and bacteria, given symptoms, will treat as UTI with antibiotics. Labs continue to demonstrate hyponatremia to 126, as well as hypokalemia to 2.9 and hypochloremia to 91.   Plan: will treat UTI with antibiotics, start fluids/repletion for slow electrolyte correction (mental status stable, no seizure activity), and admit. Discussed with hospitalist for admission. Patient agreeable.     Final  Clinical Impression(s) / ED Diagnosis  Final diagnoses:  Urinary tract infection in elderly patient  Hyponatremia       Note:  This document was prepared using Dragon voice recognition software and may include unintentional dictation errors.   Lilia Pro., MD 12/30/18 [phone]

## 2018-12-30 NOTE — ED Notes (Signed)
Pt ambulated to the restroom with out difficulty.

## 2018-12-30 NOTE — ED Notes (Signed)
Pt up to bathroom and able to ambulate independently. Pt back in bed and placed on cardiac monitor with call bell in reach.

## 2018-12-30 NOTE — Consult Note (Signed)
PHARMACY CONSULT NOTE - FOLLOW UP  Pharmacy Consult for Electrolyte Monitoring and Replacement   Recent Labs: Potassium (mmol/L)  Date Value  12/30/2018 2.9 (L)   Magnesium (mg/dL)  Date Value  12/30/2018 2.2   Calcium (mg/dL)  Date Value  12/30/2018 9.8   Albumin (g/dL)  Date Value  12/30/2018 4.7   Phosphorus (mg/dL)  Date Value  12/17/2018 2.8   Sodium (mmol/L)  Date Value  12/30/2018 126 (L)     Assessment: Pharmacy consulted for electrolyte monitoring and replacement in 78 yo female with weakness and hypokalemia. Potassium 2.9 on admission.   Goal of Therapy:  Electrolytes WNL  Plan:  9/8 AM: KCL 58mEq po x 1 dose ordered. NaCl w/20KCL @ 100nL/hr ordered but has not been started.   No additional replace at this time. Will order F/U K+ level this afternoon.   Pharmacy will continue to monitor and replace electrolytes as needed.   Pernell Dupre, PharmD, BCPS Clinical Pharmacist 12/30/2018 9:12 AM

## 2018-12-30 NOTE — ED Notes (Signed)
Pt states she hurts all over the place, most of the pain is in her arms. Pt ambulatory in room without difficulty, pt up to the bathroom, requested staff to leave room as she could independently go and does not need assistance.

## 2018-12-30 NOTE — ED Notes (Signed)
Spoke with Juanda Crumble, pharmacist, OK to run NaCl with KCL and cefTRIAXone together.

## 2018-12-30 NOTE — ED Notes (Signed)
Pt resting in bed in NAD at this time. Door open and pt close to nursing station with call bell in reach and bed locked and in lowest position.

## 2018-12-30 NOTE — ED Triage Notes (Signed)
Pt presents to ED with generalized body pain/aches. Pt states her pcp told her on 8/24 that the sodium in her blood work was low and that she needed to see a nephrologist. Pt states her appt is for the end of the month. Pt states "I am just feeling worse and worse and sometimes my arms are cold and sometimes they are hot and I'm just feeling really bad". Pt alert and cooperative. States she is feeling anxious about her appt at the end of the month. No distress noted.

## 2018-12-30 NOTE — Progress Notes (Signed)
Family Meeting Note  Advance Directive:no  Today a meeting took place with the Patient.  The following clinical team members were present during this meeting:MD  The following were discussed:[name] diagnosis: hyponatemia UTI , [name] progosis: Unable to determine and Goals for treatment: Full Code  Additional follow-up to be provided: chaplain consult for AD  Time spent during discussion: 69 minutes Bettey Costa, MD

## 2018-12-30 NOTE — Consult Note (Signed)
PHARMACY CONSULT NOTE - FOLLOW UP  Pharmacy Consult for Electrolyte Monitoring and Replacement   Recent Labs: Potassium (mmol/L)  Date Value  12/30/2018 3.4 (L)   Magnesium (mg/dL)  Date Value  12/30/2018 2.2   Calcium (mg/dL)  Date Value  12/30/2018 9.8   Albumin (g/dL)  Date Value  12/30/2018 4.7   Phosphorus (mg/dL)  Date Value  12/17/2018 2.8   Sodium (mmol/L)  Date Value  12/30/2018 126 (L)     Assessment: Pharmacy consulted for electrolyte monitoring and replacement in 78 yo female with weakness and hypokalemia. Potassium 2.9 on admission.   Goal of Therapy:  Electrolytes WNL  Plan:  9/8 @ 1420:K 3.4.Will order KCL 84mEq PO x 1 dose.   No additional replace at this time.   Pharmacy will continue to monitor and replace electrolytes as needed.   F/U with AM labs.   Pernell Dupre, PharmD, BCPS Clinical Pharmacist 12/30/2018 2:57 PM

## 2018-12-30 NOTE — Progress Notes (Signed)
PT Screen Note  Patient Details Name: Katie Bell MRN: [id number] DOB: July 29, 1940   Cancelled Treatment:    Reason Eval/Treat Not Completed: PT screened, no needs identified, will sign off Pt was able to easily get out of bed and walk >150 ft w/o AD, w/o hesitation and w/o safety issues. Vitals remain relatively stable t/o the issues, pt reports feeling essentially at her baseline with no other concerns (sensation, strength, balance, etc all unchanged) Nursing also confirms that she has been getting up to go to the bathroom, etc w/o issue, will complete PT orders, no needs.  Kreg Shropshire, DPT 12/30/2018, 2:32 PM

## 2018-12-30 NOTE — ED Notes (Signed)
IV team at bedside 

## 2018-12-30 NOTE — ED Notes (Signed)
Report received, care assumed.

## 2018-12-30 NOTE — ED Notes (Addendum)
Pt up to use bathroom with steady gait and nonskid socks on- NAD at this time

## 2018-12-31 ENCOUNTER — Other Ambulatory Visit: Payer: Self-pay | Admitting: Internal Medicine

## 2018-12-31 LAB — CBC
HCT: 35.9 % — ABNORMAL LOW (ref 36.0–46.0)
Hemoglobin: 12.5 g/dL (ref 12.0–15.0)
MCH: 31.7 pg (ref 26.0–34.0)
MCHC: 34.8 g/dL (ref 30.0–36.0)
MCV: 91.1 fL (ref 80.0–100.0)
Platelets: 209 10*3/uL (ref 150–400)
RBC: 3.94 MIL/uL (ref 3.87–5.11)
RDW: 11.7 % (ref 11.5–15.5)
WBC: 5.7 10*3/uL (ref 4.0–10.5)
nRBC: 0 % (ref 0.0–0.2)

## 2018-12-31 LAB — BASIC METABOLIC PANEL
Anion gap: 5 (ref 5–15)
BUN: 13 mg/dL (ref 8–23)
CO2: 21 mmol/L — ABNORMAL LOW (ref 22–32)
Calcium: 8.8 mg/dL — ABNORMAL LOW (ref 8.9–10.3)
Chloride: 110 mmol/L (ref 98–111)
Creatinine, Ser: 0.7 mg/dL (ref 0.44–1.00)
GFR calc Af Amer: >60 mL/min (ref >60)
GFR calc non Af Amer: >60 mL/min (ref >60)
Glucose, Bld: 95 mg/dL (ref 70–99)
Potassium: 4 mmol/L (ref 3.5–5.1)
Sodium: 136 mmol/L (ref 135–145)

## 2018-12-31 LAB — URINE CULTURE

## 2018-12-31 MED ORDER — FOSFOMYCIN TROMETHAMINE 3 G PO PACK
3.0000 g | PACK | ORAL | Status: DC
  Administered 2018-12-31: 3 g via ORAL
  Filled 2018-12-31: qty 3

## 2018-12-31 NOTE — Discharge Summary (Signed)
[facility] [place] NAME: Katie Bell    MR#:  [id number]  DATE OF BIRTH:  [birth year]  DATE OF ADMISSION:  12/30/2018 ADMITTING PHYSICIAN: Bettey Costa, MD  DATE OF DISCHARGE: 12/31/2018  PRIMARY CARE PHYSICIAN: Venia Carbon, MD    ADMISSION DIAGNOSIS:  Hyponatremia [E87.1] Urinary tract infection in elderly patient [N39.0]  DISCHARGE DIAGNOSIS:  Active Problems:   Sepsis ([place])   SECONDARY DIAGNOSIS:   Past Medical History:  Diagnosis Date  . Anxiety   . Cancer ([place]) 2015  . Depression   . Episodic mood disorder ([place])    mostly anxiety--depression in the past  . Gastric ulcer    History  . History of blood transfusion    At [facility] appro 15 yrs ago  . History of kidney stones 1960  . Hypertension   . Osteoarthritis, hand    hand  . Postmenopausal disorder   . SVD (spontaneous vaginal delivery)    x 3    HOSPITAL COURSE:   78 year old female with hypertension who presents emergency room with generalized weakness.  1.  Hyponatremia: This was etiology of patient's generalized weakness.    Her sodium level has improved.  We did stop losartan/HCTZ.  She will follow-up with her primary care physician regarding this medication.  2.  UTI: Her urine culture was not collected appropriately.  Due to her history of ESBL in the past we tailored her antibiotics accordingly.  She does not need antibiotics at discharge.  She received fosfomycin prior to discharge.       3.  Essential hypertension: Hold losartan/HCTZ for now.    Will have outpatient follow-up with her PCP.  4  Hypokalemia: This was repleted prior to discharge.  DISCHARGE CONDITIONS AND DIET:   Stable for discharge regular diet  CONSULTS OBTAINED:    DRUG ALLERGIES:   Allergies  Allergen Reactions  . Sulfamethoxazole-Trimethoprim Swelling    Angioedema and pruritis per 01/01/2017 documentation in care everywhere  . Morphine    agitation  . Morphine And Related Anxiety    DISCHARGE MEDICATIONS:   Allergies as of 12/31/2018      Reactions   Sulfamethoxazole-trimethoprim Swelling   Angioedema and pruritis per 01/01/2017 documentation in care everywhere   Morphine    agitation   Morphine And Related Anxiety      Medication List    STOP taking these medications   losartan-hydrochlorothiazide 100-25 MG tablet Commonly known as: HYZAAR     TAKE these medications   diazepam 10 MG tablet Commonly known as: VALIUM TAKE 0.5 TABLET (5 MG TOTAL) BY MOUTH TWICE DAILY AS NEEDED FOR ANXIETY OR SLEEP   Fish Oil 1000 MG Caps Take 1,000 mg by mouth daily.   vitamin C 500 MG tablet Commonly known as: ASCORBIC ACID Take 500 mg by mouth daily.   Vitamin D 400 units capsule Take 800 Units by mouth daily.         Today   CHIEF COMPLAINT:  Patient is doing okay this morning.  Sort of tearful as she thinks it is her fault she is in the hospital.   VITAL SIGNS:  Blood pressure (!) 149/67, pulse 71, temperature 98.1 F (36.7 C), temperature source Oral, resp. rate 20, height 5\' 1"  (1.549 m), weight 53.7 kg, SpO2 99 %.   REVIEW OF SYSTEMS:  Review of Systems  Constitutional: Negative.  Negative for chills, fever and malaise/fatigue.  HENT: Negative.  Negative for ear discharge,  ear pain, hearing loss, nosebleeds and sore throat.   Eyes: Negative.  Negative for blurred vision and pain.  Respiratory: Negative.  Negative for cough, hemoptysis, shortness of breath and wheezing.   Cardiovascular: Negative.  Negative for chest pain, palpitations and leg swelling.  Gastrointestinal: Negative.  Negative for abdominal pain, blood in stool, diarrhea, nausea and vomiting.  Genitourinary: Negative.  Negative for dysuria.  Musculoskeletal: Negative.  Negative for back pain.  Skin: Negative.   Neurological: Negative for dizziness, tremors, speech change, focal weakness, seizures and headaches.  Endo/Heme/Allergies:  Negative.  Does not bruise/bleed easily.  Psychiatric/Behavioral: Negative.  Negative for depression, hallucinations and suicidal ideas.     PHYSICAL EXAMINATION:  GENERAL:  78 y.o.-year-old patient lying in the bed with no acute distress.  NECK:  Supple, no jugular venous distention. No thyroid enlargement, no tenderness.  LUNGS: Normal breath sounds bilaterally, no wheezing, rales,rhonchi  No use of accessory muscles of respiration.  CARDIOVASCULAR: S1, S2 normal. No murmurs, rubs, or gallops.  ABDOMEN: Soft, non-tender, non-distended. Bowel sounds present. No organomegaly or mass.  EXTREMITIES: No pedal edema, cyanosis, or clubbing.  PSYCHIATRIC: The patient is alert and oriented x 3.  SKIN: No obvious rash, lesion, or ulcer.   DATA REVIEW:   CBC Recent Labs  Lab 12/31/18 0523  WBC 5.7  HGB 12.5  HCT 35.9*  PLT 209    Chemistries  Recent Labs  Lab 12/30/18 0414  12/31/18 0523  NA 126*  --  136  K 2.9*   < > 4.0  CL 91*  --  110  CO2 23  --  21*  GLUCOSE 113*  --  95  BUN 16  --  13  CREATININE 0.89  --  0.70  CALCIUM 9.8  --  8.8*  MG 2.2  --   --   AST 20  --   --   ALT 15  --   --   ALKPHOS 46  --   --   BILITOT 1.1  --   --    < > = values in this interval not displayed.    Cardiac Enzymes No results for input(s): TROPONINI in the last 168 hours.  Microbiology Results  @MICRORSLT48 @  RADIOLOGY:  No results found.    Allergies as of 12/31/2018      Reactions   Sulfamethoxazole-trimethoprim Swelling   Angioedema and pruritis per 01/01/2017 documentation in care everywhere   Morphine    agitation   Morphine And Related Anxiety      Medication List    STOP taking these medications   losartan-hydrochlorothiazide 100-25 MG tablet Commonly known as: HYZAAR     TAKE these medications   diazepam 10 MG tablet Commonly known as: VALIUM TAKE 0.5 TABLET (5 MG TOTAL) BY MOUTH TWICE DAILY AS NEEDED FOR ANXIETY OR SLEEP   Fish Oil 1000 MG Caps Take  1,000 mg by mouth daily.   vitamin C 500 MG tablet Commonly known as: ASCORBIC ACID Take 500 mg by mouth daily.   Vitamin D 400 units capsule Take 800 Units by mouth daily.          Management plans discussed with the patient and she is in agreement. Stable for discharge home  Patient should follow up with pcp  CODE STATUS:     Code Status Orders  (From admission, onward)         Start     Ordered   12/30/18 1110  Full code  Continuous  12/30/18 1109        Code Status History    This patient has a current code status but no historical code status.   Advance Care Planning Activity      TOTAL TIME TAKING CARE OF THIS PATIENT: 38 minutes.    Note: This dictation was prepared with Dragon dictation along with smaller phrase technology. Any transcriptional errors that result from this process are unintentional.  Bettey Costa M.D on 12/31/2018 at 11:24 AM  Between 7am to 6pm - Pager - [phone] After 6pm go to [email or web address] - password [email or web address] [facility]  Office  [phone]  CC: Primary care physician; Venia Carbon, MD

## 2019-01-01 ENCOUNTER — Telehealth: Payer: Self-pay | Admitting: Internal Medicine

## 2019-01-01 ENCOUNTER — Telehealth: Payer: Self-pay

## 2019-01-01 MED ORDER — DIAZEPAM 10 MG PO TABS: ORAL_TABLET | ORAL | 0 refills | Status: DC

## 2019-01-01 NOTE — Telephone Encounter (Signed)
Spoke to pt. She will get her BP checked and let us know if it goes up and stays there.

## 2019-01-01 NOTE — Telephone Encounter (Signed)
Patient returned your call.

## 2019-01-01 NOTE — Telephone Encounter (Signed)
Spoke to pt. She said she was on losartan-hctz 50-12.5mg  daily in the past. She wasn't sure if Dr Silvio Pate wanted her to go back on that or wait until she comes in for her OV next week to get something for BP. She is concerned because she is not on anything right now.

## 2019-01-01 NOTE — Telephone Encounter (Signed)
Transition Care Management Follow-up Telephone Call Please review patient concern section  Date discharged? 12/31/2018   How have you been since you were released from the hospital? Spoke with patient. She is doing better she feels like. No symptoms present of concern at this time.    Do you understand why you were in the hospital? yes   Do you understand the discharge instructions? yes   Where were you discharged to? Home with her son   Items Reviewed:  Medications reviewed: yes  Allergies reviewed: yes  Dietary changes reviewed: yes  Referrals reviewed: yes   Functional Questionnaire:   Activities of Daily Living (ADLs):   She states they are independent in the following: ambulation, fixing food and medication, toileting, dressing, bathing, hygiene, grooming. States they require assistance with the following: none   Any transportation issues/concerns?: No   Any patient concerns? Patient needs Diazepam refilled. Patient states she picked up Diazepam that was sent in to the pharmacy on 12/04/2018 but she ended up throwing this medication away afterwards because she was trying to stop taking this. Patient realizes she should not have done that and needs another refill sent in so she can go back on it.    Confirmed importance and date/time of follow-up visits scheduled yes  Provider Appointment booked with Dr. Silvio Pate on 01/05/2019.  Confirmed with patient if condition begins to worsen call PCP or go to the ER.  Patient was given the office number and encouraged to call back with question or concerns.  : yes

## 2019-01-01 NOTE — Telephone Encounter (Signed)
Left message to call office

## 2019-01-01 NOTE — Telephone Encounter (Signed)
Patient scheduled a hospital follow up for next week. She stated she will be out of BP medication before then and would like to know if this could be sent before the appt so she does not go without.     [place]

## 2019-01-01 NOTE — Telephone Encounter (Signed)
I have asked her numerous times to stop this as it was making her sodium dangerously low (but she wouldn't ----apparently in concern about her blood pressure).  She should not take this medication. She should be fine to wait till next week--but if she can check her BP daily if would be good (and let me know if it is over 160/100). I would probably start amlodipine

## 2019-01-06 ENCOUNTER — Other Ambulatory Visit: Payer: Self-pay

## 2019-01-06 ENCOUNTER — Ambulatory Visit (INDEPENDENT_AMBULATORY_CARE_PROVIDER_SITE_OTHER): Payer: PPO | Admitting: Internal Medicine

## 2019-01-06 ENCOUNTER — Encounter: Payer: Self-pay | Admitting: Internal Medicine

## 2019-01-06 VITALS — BP 132/80 | HR 76 | Temp 98.7°F | Ht 61.0 in | Wt 121.0 lb

## 2019-01-06 DIAGNOSIS — E871 Hypo-osmolality and hyponatremia: Secondary | ICD-10-CM | POA: Diagnosis not present

## 2019-01-06 DIAGNOSIS — I10 Essential (primary) hypertension: Secondary | ICD-10-CM | POA: Diagnosis not present

## 2019-01-06 DIAGNOSIS — R300 Dysuria: Secondary | ICD-10-CM | POA: Diagnosis not present

## 2019-01-06 LAB — RENAL FUNCTION PANEL
Albumin: 4.2 g/dL (ref 3.5–5.2)
BUN: 10 mg/dL (ref 6–23)
CO2: 30 mEq/L (ref 19–32)
Calcium: 9.7 mg/dL (ref 8.4–10.5)
Chloride: 104 mEq/L (ref 96–112)
Creatinine, Ser: 0.78 mg/dL (ref 0.40–1.20)
GFR: 71.18 mL/min (ref >60.00)
Glucose, Bld: 100 mg/dL — ABNORMAL HIGH (ref 70–99)
Phosphorus: 2.5 mg/dL (ref 2.3–4.6)
Potassium: 3.3 mEq/L — ABNORMAL LOW (ref 3.5–5.1)
Sodium: 140 mEq/L (ref 135–145)

## 2019-01-06 MED ORDER — ESTRADIOL 4 MCG VA INST: 1.0000 "application " | VAGINAL_INSERT | VAGINAL | 11 refills | Status: DC

## 2019-01-06 NOTE — Patient Instructions (Addendum)
You don't need any medication for your blood pressure for now. Please get the vaginal estrogen from the pharmacy---let me know if you have any trouble with this. Please let me know if the diarrhea doesn't improve by next week--try to eat more and increase fiber intake (you can even try something like metamucil or citrucel)

## 2019-01-06 NOTE — Progress Notes (Signed)
Subjective:    Patient ID: Katie Bell, female    DOB: June 20, 1940, 78 y.o.   MRN: [id number]  HPI Here for hospital follow up Reviewed records concurrently while she was there---and reviewed discjarge summary  Came in with generalized weakness Persistent hyponatremia which improved while there  Feels there was reaction/allergy to antibiotics for apparent UTI She doesn't remember what she got--or from whom (I had given her keflex and ntrofurantoin in the past 1-2 months) Did get fosformycin before discharge Still has some burning dysuria  BP was okay there Reiterated with her not to take losartan/HCTZ anymore No chest pain No headache No SOB No dizziness or syncope  Current Outpatient Medications on File Prior to Visit  Medication Sig Dispense Refill  . Cholecalciferol (VITAMIN D) 400 UNITS capsule Take 800 Units by mouth daily.    . diazepam (VALIUM) 10 MG tablet TAKE 0.5 TABLET (5 MG TOTAL) BY MOUTH TWICE DAILY AS NEEDED FOR ANXIETY OR SLEEP 30 tablet 0  . Omega-3 Fatty Acids (FISH OIL) 1000 MG CAPS Take 1,000 mg by mouth daily.     . vitamin C (ASCORBIC ACID) 500 MG tablet Take 500 mg by mouth daily.     No current facility-administered medications on file prior to visit.     Allergies  Allergen Reactions  . Sulfamethoxazole-Trimethoprim Swelling    Angioedema and pruritis per 01/01/2017 documentation in care everywhere  . Morphine     agitation  . Morphine And Related Anxiety    Past Medical History:  Diagnosis Date  . Anxiety   . Cancer ([place]) 2015  . Depression   . Episodic mood disorder ([place])    mostly anxiety--depression in the past  . Gastric ulcer    History  . History of blood transfusion    At [facility] appro 15 yrs ago  . History of kidney stones 1960  . Hypertension   . Osteoarthritis, hand    hand  . Postmenopausal disorder   . SVD (spontaneous vaginal delivery)    x 3    Past Surgical History:  Procedure Laterality  Date  . CHOLECYSTECTOMY  2008  . COLONOSCOPY  04/2011  . CYSTOCELE REPAIR N/A 12/31/2013   Procedure: ANTERIOR REPAIR (CYSTOCELE);  Surgeon: Donnamae Jude, MD;  Location: [facility];  Service: Gynecology;  Laterality: N/A;  . CYSTOSCOPY W/ URETERAL STENT PLACEMENT Left 01/16/2016   Procedure: CYSTOSCOPY WITH STENT REPLACEMENT;  Surgeon: Hollice Espy, MD;  Location: [facility];  Service: Urology;  Laterality: Left;  . CYSTOSCOPY WITH STENT PLACEMENT Left 12/23/2015   Procedure: CYSTOSCOPY WITH STENT PLACEMENT;  Surgeon: Ardis Hughs, MD;  Location: [facility];  Service: Urology;  Laterality: Left;  . DIAGNOSTIC LAPAROSCOPY    . kidney stone removed    . MELANOMA EXCISION  2015   left posterior thigh  . TONSILLECTOMY  1944  . TUBAL LIGATION    . UPPER GI ENDOSCOPY    . URETEROSCOPY WITH HOLMIUM LASER LITHOTRIPSY Left 01/16/2016   Procedure: URETEROSCOPY WITH HOLMIUM LASER LITHOTRIPSY;  Surgeon: Hollice Espy, MD;  Location: [facility];  Service: Urology;  Laterality: Left;  . WISDOM TOOTH EXTRACTION      Family History  Problem Relation Age of Onset  . Alzheimer's disease Mother   . Alcohol abuse Father   . COPD Brother   . Heart disease Neg Hx   . Cancer Neg Hx     Social History   Socioeconomic History  . Marital status: Divorced  Spouse name: Not on file  . Number of children: 3  . Years of education: Not on file  . Highest education level: Not on file  Occupational History  . 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 Financial resource strain: Not on file  . Food insecurity    Worry: Not on file    Inability: Not on file  . Transportation needs    Medical: Not on file    Non-medical: Not on file  Tobacco Use  . Smoking status: Never Smoker  . Smokeless tobacco: Never Used  Substance and Sexual Activity  . Alcohol use: No    Alcohol/week: 0.0 standard drinks  . Drug use: No  . Sexual activity: Yes    Birth control/protection:  Post-menopausal  Lifestyle  . Physical activity    Days per week: Not on file    Minutes per session: Not on file  . Stress: Not on file  Relationships  . Social Herbalist on phone: Not on file    Gets together: Not on file    Attends religious service: Not on file    Active member of club or organization: Not on file    Attends meetings of clubs or organizations: Not on file    Relationship status: Not on file  . Intimate partner violence    Fear of current or ex partner: Not on file    Emotionally abused: Not on file    Physically abused: Not on file    Forced sexual activity: Not on file  Other Topics Concern  . Not on file  Social History Narrative   3 sons--- middle one lives with her   [name]      Has living will   Son [name]. Then son [name]   Would accept resuscitation attempts but no prolonged ventilation   No tube feeds if cognitively unaware      Review of Systems Having persistent loose stools Not eating much now No fever Weight is stable Sleeps okay--does have nocturia x 3-4 (but able to get back to sleep). Uses diazepam most nights ---thought I told her to (discussed that this should be prn) Has mild arthritis pain    Objective:   Physical Exam  Constitutional: No distress.  Neck: No thyromegaly present.  Cardiovascular: Normal rate, regular rhythm and normal heart sounds. Exam reveals no gallop.  No murmur heard. Respiratory: Effort normal and breath sounds normal. No respiratory distress. She has no wheezes. She has no rales.  GI: Soft. She exhibits no distension. There is no abdominal tenderness. There is no rebound and no guarding.  Musculoskeletal:        General: No edema.  Lymphadenopathy:    She has no cervical adenopathy.  Neurological:  Moving slow---able to get on table but takes effort  Psychiatric: She has a normal mood and affect. Her behavior is normal.           Assessment & Plan:

## 2019-01-06 NOTE — Assessment & Plan Note (Signed)
Will try vaginal estrogen to see if that helps her recurrent symptoms

## 2019-01-06 NOTE — Assessment & Plan Note (Signed)
BP Readings from Last 3 Encounters:  01/06/19 132/80  12/31/18 (!) 149/67  12/15/18 130/82   Fine without meds Will recheck in 2 months

## 2019-01-06 NOTE — Assessment & Plan Note (Signed)
Was severe and seems to be cause for the severe weakness and hospital stay Better off the BP med Will recheck labs

## 2019-01-19 DIAGNOSIS — E871 Hypo-osmolality and hyponatremia: Secondary | ICD-10-CM | POA: Diagnosis not present

## 2019-01-19 DIAGNOSIS — N189 Chronic kidney disease, unspecified: Secondary | ICD-10-CM | POA: Diagnosis not present

## 2019-01-19 DIAGNOSIS — R809 Proteinuria, unspecified: Secondary | ICD-10-CM | POA: Diagnosis not present

## 2019-01-19 DIAGNOSIS — N3000 Acute cystitis without hematuria: Secondary | ICD-10-CM | POA: Diagnosis not present

## 2019-01-19 DIAGNOSIS — I10 Essential (primary) hypertension: Secondary | ICD-10-CM | POA: Diagnosis not present

## 2019-01-19 DIAGNOSIS — E876 Hypokalemia: Secondary | ICD-10-CM | POA: Diagnosis not present

## 2019-01-20 ENCOUNTER — Ambulatory Visit (INDEPENDENT_AMBULATORY_CARE_PROVIDER_SITE_OTHER): Payer: PPO | Admitting: Internal Medicine

## 2019-01-20 ENCOUNTER — Encounter: Payer: Self-pay | Admitting: Internal Medicine

## 2019-01-20 VITALS — BP 166/102 | HR 81 | Temp 98.5°F | Wt 119.0 lb

## 2019-01-20 DIAGNOSIS — I10 Essential (primary) hypertension: Secondary | ICD-10-CM | POA: Diagnosis not present

## 2019-01-20 MED ORDER — AMLODIPINE BESYLATE 5 MG PO TABS: 5.0000 mg | ORAL_TABLET | Freq: Every day | ORAL | 0 refills | Status: DC

## 2019-01-20 NOTE — Patient Instructions (Signed)

## 2019-01-20 NOTE — Progress Notes (Signed)
Subjective:    Patient ID: Katie Bell, female    DOB: 1941-01-21, 78 y.o.   MRN: [id number]  HPI  Pt presents to the clinic today with c/o headache and elevated blood pressure. She was in the hospital, discharged on 9/9. Losartan HCT was d/c'd at that time due to chronic hyponatremia and hypokalemia. She went to the nephrologist yesterday and was told her BP was elevated. She does not monitor her blood pressure at home. She reports the headache is located in her forehead; she took an aspirin this morning with minimal relief. She also described a brief episode of heart racing, but attributes it to being so upset. She describes the pain as pressure. She denies dizziness, visual changes, chest pain or chest tightness, shortness of breath. Her BP today is 166/102.  Review of Systems      Past Medical History:  Diagnosis Date  . Anxiety   . Cancer ([place]) 2015  . Depression   . Episodic mood disorder ([place])    mostly anxiety--depression in the past  . Gastric ulcer    History  . History of blood transfusion    At [facility] appro 15 yrs ago  . History of kidney stones 1960  . Hypertension   . Osteoarthritis, hand    hand  . Postmenopausal disorder   . SVD (spontaneous vaginal delivery)    x 3    Current Outpatient Medications  Medication Sig Dispense Refill  . Cholecalciferol (VITAMIN D) 400 UNITS capsule Take 800 Units by mouth daily.    . diazepam (VALIUM) 10 MG tablet TAKE 0.5 TABLET (5 MG TOTAL) BY MOUTH TWICE DAILY AS NEEDED FOR ANXIETY OR SLEEP 30 tablet 0  . Omega-3 Fatty Acids (FISH OIL) 1000 MG CAPS Take 1,000 mg by mouth daily.     . vitamin C (ASCORBIC ACID) 500 MG tablet Take 500 mg by mouth daily.     No current facility-administered medications for this visit.     Allergies  Allergen Reactions  . Sulfamethoxazole-Trimethoprim Swelling    Angioedema and pruritis per 01/01/2017 documentation in care everywhere  . Estradiol Other (See Comments)     Vaginal irritation  . Morphine     agitation  . Morphine And Related Anxiety    Family History  Problem Relation Age of Onset  . Alzheimer's disease Mother   . Alcohol abuse Father   . COPD Brother   . Heart disease Neg Hx   . Cancer Neg Hx     Social History   Socioeconomic History  . Marital status: Divorced    Spouse name: Not on file  . Number of children: 3  . Years of education: Not on file  . Highest education level: Not on file  Occupational History  . 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 Financial resource strain: Not on file  . Food insecurity    Worry: Not on file    Inability: Not on file  . Transportation needs    Medical: Not on file    Non-medical: Not on file  Tobacco Use  . Smoking status: Never Smoker  . Smokeless tobacco: Never Used  Substance and Sexual Activity  . Alcohol use: No    Alcohol/week: 0.0 standard drinks  . Drug use: No  . Sexual activity: Yes    Birth control/protection: Post-menopausal  Lifestyle  . Physical activity    Days per week: Not on file  Minutes per session: Not on file  . Stress: Not on file  Relationships  . Social Herbalist on phone: Not on file    Gets together: Not on file    Attends religious service: Not on file    Active member of club or organization: Not on file    Attends meetings of clubs or organizations: Not on file    Relationship status: Not on file  . Intimate partner violence    Fear of current or ex partner: Not on file    Emotionally abused: Not on file    Physically abused: Not on file    Forced sexual activity: Not on file  Other Topics Concern  . Not on file  Social History Narrative   3 sons--- middle one lives with her   [name]      Has living will   Son [name]. Then son [name]   Would accept resuscitation attempts but no prolonged ventilation   No tube feeds if cognitively unaware         Constitutional: Endorses frontal headache. Denies fever, malaise, fatigue, or abrupt weight changes.  Respiratory: Denies difficulty breathing, shortness of breath, cough or sputum production.   Cardiovascular: Endorses brief episodes of 'heart racing' but attributes it to being so upset. Denies chest pain, chest tightness, or swelling in the hands or feet.  Neurological: Denies dizziness, difficulty with memory, difficulty with speech or problems with balance and coordination.  Psych: reports situational anxiety over her elevated BP, denies depression, SI/HI.  No other specific complaints in a complete review of systems (except as listed in HPI above).  Objective:   Physical Exam  BP (!) 166/102   Pulse 81   Temp 98.5 F (36.9 C) (Temporal)   Wt 119 lb (54 kg)   SpO2 98%   BMI 22.48 kg/m   General: Patient appears stated age.  Respiratory: Lungs clear to auscultation bilaterally. No rales, wheezing heard. Cardiovascular: Regular rate and rhythm. Radial and pedal pulses 2+ bilaterally.      Assessment & Plan:  Hypertension  Initiate Amlodipine 5mg  po daily.  Reinforced DASH diet  RTC in 2 weeks for a blood pressure check Webb Silversmith, NP

## 2019-01-23 DIAGNOSIS — H40001 Preglaucoma, unspecified, right eye: Secondary | ICD-10-CM | POA: Diagnosis not present

## 2019-01-23 DIAGNOSIS — H2513 Age-related nuclear cataract, bilateral: Secondary | ICD-10-CM | POA: Diagnosis not present

## 2019-02-03 DIAGNOSIS — H2512 Age-related nuclear cataract, left eye: Secondary | ICD-10-CM | POA: Diagnosis not present

## 2019-02-04 ENCOUNTER — Encounter: Payer: Self-pay | Admitting: *Deleted

## 2019-02-05 ENCOUNTER — Other Ambulatory Visit: Payer: Self-pay | Admitting: Internal Medicine

## 2019-02-06 NOTE — Telephone Encounter (Signed)
Last filled 01-01-19 #30 Last OV 01-20-19 Next OV 03-09-19 [place] to [facility] in Dr Alla German absence

## 2019-02-07 ENCOUNTER — Other Ambulatory Visit: Payer: Self-pay | Admitting: Internal Medicine

## 2019-02-09 ENCOUNTER — Other Ambulatory Visit
Admission: RE | Admit: 2019-02-09 | Discharge: 2019-02-09 | Disposition: A | Discharge status: Home / Self Care | Payer: PPO | Source: Ambulatory Visit | Attending: Ophthalmology | Admitting: Ophthalmology

## 2019-02-09 ENCOUNTER — Other Ambulatory Visit: Payer: Self-pay

## 2019-02-09 DIAGNOSIS — Z20828 Contact with and (suspected) exposure to other viral communicable diseases: Secondary | ICD-10-CM | POA: Insufficient documentation

## 2019-02-09 DIAGNOSIS — Z01812 Encounter for preprocedural laboratory examination: Secondary | ICD-10-CM | POA: Insufficient documentation

## 2019-02-09 LAB — SARS CORONAVIRUS 2 (TAT 6-24 HRS): SARS Coronavirus 2: NEGATIVE

## 2019-02-12 ENCOUNTER — Ambulatory Visit: Payer: PPO | Admitting: Anesthesiology

## 2019-02-12 ENCOUNTER — Other Ambulatory Visit: Payer: Self-pay

## 2019-02-12 ENCOUNTER — Ambulatory Visit
Admission: RE | Admit: 2019-02-12 | Discharge: 2019-02-12 | Disposition: A | Discharge status: Home / Self Care | Payer: PPO | Attending: Ophthalmology | Admitting: Ophthalmology

## 2019-02-12 ENCOUNTER — Encounter
Admission: RE | Disposition: A | Discharge status: Home / Self Care | Payer: Self-pay | Source: Home / Self Care | Attending: Ophthalmology

## 2019-02-12 ENCOUNTER — Encounter: Payer: Self-pay | Admitting: Anesthesiology

## 2019-02-12 DIAGNOSIS — Z885 Allergy status to narcotic agent status: Secondary | ICD-10-CM | POA: Insufficient documentation

## 2019-02-12 DIAGNOSIS — H2511 Age-related nuclear cataract, right eye: Secondary | ICD-10-CM | POA: Diagnosis not present

## 2019-02-12 DIAGNOSIS — F418 Other specified anxiety disorders: Secondary | ICD-10-CM | POA: Diagnosis not present

## 2019-02-12 DIAGNOSIS — M1990 Unspecified osteoarthritis, unspecified site: Secondary | ICD-10-CM | POA: Diagnosis not present

## 2019-02-12 DIAGNOSIS — I10 Essential (primary) hypertension: Secondary | ICD-10-CM | POA: Insufficient documentation

## 2019-02-12 DIAGNOSIS — Z882 Allergy status to sulfonamides status: Secondary | ICD-10-CM | POA: Insufficient documentation

## 2019-02-12 DIAGNOSIS — Z87891 Personal history of nicotine dependence: Secondary | ICD-10-CM | POA: Diagnosis not present

## 2019-02-12 DIAGNOSIS — K219 Gastro-esophageal reflux disease without esophagitis: Secondary | ICD-10-CM | POA: Diagnosis not present

## 2019-02-12 DIAGNOSIS — Z888 Allergy status to other drugs, medicaments and biological substances status: Secondary | ICD-10-CM | POA: Insufficient documentation

## 2019-02-12 HISTORY — DX: Gastro-esophageal reflux disease without esophagitis: K21.9

## 2019-02-12 HISTORY — DX: Personal history of other diseases of the digestive system: Z87.19

## 2019-02-12 HISTORY — PX: CATARACT EXTRACTION W/PHACO: SHX586

## 2019-02-12 SURGERY — PHACOEMULSIFICATION, CATARACT, WITH IOL INSERTION
Anesthesia: Monitor Anesthesia Care | Site: Eye | Laterality: Right

## 2019-02-12 MED ORDER — MOXIFLOXACIN HCL 0.5 % OP SOLN
OPHTHALMIC | Status: AC
  Filled 2019-02-12: qty 3

## 2019-02-12 MED ORDER — CYCLOPENTOLATE HCL 2 % OP SOLN
1.0000 [drp] | OPHTHALMIC | Status: DC | PRN
  Administered 2019-02-12: 10:00:00 1 [drp] via OPHTHALMIC

## 2019-02-12 MED ORDER — CARBACHOL 0.01 % IO SOLN
INTRAOCULAR | Status: DC | PRN
  Administered 2019-02-12: 0.5 mL via INTRAOCULAR

## 2019-02-12 MED ORDER — PHENYLEPHRINE HCL 10 % OP SOLN
OPHTHALMIC | Status: AC
  Administered 2019-02-12: 1 [drp] via OPHTHALMIC
  Filled 2019-02-12: qty 5

## 2019-02-12 MED ORDER — EPINEPHRINE PF 1 MG/ML IJ SOLN
INTRAOCULAR | Status: DC | PRN
  Administered 2019-02-12: 10:00:00 via OPHTHALMIC

## 2019-02-12 MED ORDER — ARMC OPHTHALMIC DILATING DROPS
1.0000 "application " | OPHTHALMIC | Status: AC
  Administered 2019-02-12 (×3): 1 via OPHTHALMIC

## 2019-02-12 MED ORDER — MOXIFLOXACIN HCL 0.5 % OP SOLN - NO CHARGE
1.0000 [drp] | Freq: Once | OPHTHALMIC | Status: AC
  Administered 2019-02-12: 1 [drp] via OPHTHALMIC
  Filled 2019-02-12: qty 3

## 2019-02-12 MED ORDER — CYCLOPENTOLATE HCL 2 % OP SOLN
OPHTHALMIC | Status: AC
  Administered 2019-02-12: 1 [drp] via OPHTHALMIC
  Filled 2019-02-12: qty 2

## 2019-02-12 MED ORDER — TETRACAINE HCL 0.5 % OP SOLN
1.0000 [drp] | OPHTHALMIC | Status: DC | PRN
  Administered 2019-02-12: 09:00:00 1 [drp] via OPHTHALMIC

## 2019-02-12 MED ORDER — POVIDONE-IODINE 5 % OP SOLN
OPHTHALMIC | Status: DC | PRN
  Administered 2019-02-12: 1 via OPHTHALMIC

## 2019-02-12 MED ORDER — TRYPAN BLUE 0.06 % OP SOLN
OPHTHALMIC | Status: DC | PRN
  Administered 2019-02-12: 0.5 mL via INTRAOCULAR

## 2019-02-12 MED ORDER — LIDOCAINE HCL (PF) 4 % IJ SOLN
INTRAOCULAR | Status: DC | PRN
  Administered 2019-02-12: 4 mL via OPHTHALMIC

## 2019-02-12 MED ORDER — NA CHONDROIT SULF-NA HYALURON 40-17 MG/ML IO SOLN
INTRAOCULAR | Status: DC | PRN
  Administered 2019-02-12: 1 mL via INTRAOCULAR

## 2019-02-12 MED ORDER — TETRACAINE HCL 0.5 % OP SOLN
OPHTHALMIC | Status: AC
  Administered 2019-02-12: 1 [drp] via OPHTHALMIC
  Filled 2019-02-12: qty 4

## 2019-02-12 MED ORDER — PHENYLEPHRINE HCL 10 % OP SOLN
1.0000 [drp] | OPHTHALMIC | Status: DC | PRN
  Administered 2019-02-12: 10:00:00 1 [drp] via OPHTHALMIC

## 2019-02-12 MED ORDER — ARMC OPHTHALMIC DILATING DROPS
OPHTHALMIC | Status: AC
  Administered 2019-02-12: 1 via OPHTHALMIC
  Filled 2019-02-12: qty 0.5

## 2019-02-12 MED ORDER — MOXIFLOXACIN HCL 0.5 % OP SOLN
OPHTHALMIC | Status: DC | PRN
  Administered 2019-02-12: 0.2 mL

## 2019-02-12 MED ORDER — SODIUM CHLORIDE 0.9 % IV SOLN
INTRAVENOUS | Status: DC
  Administered 2019-02-12: 10:00:00 via INTRAVENOUS

## 2019-02-12 MED ORDER — DEXMEDETOMIDINE HCL 200 MCG/2ML IV SOLN
INTRAVENOUS | Status: DC | PRN
  Administered 2019-02-12: 8 ug via INTRAVENOUS
  Administered 2019-02-12: 12 ug via INTRAVENOUS

## 2019-02-12 MED ORDER — LACTATED RINGERS IV SOLN
INTRAVENOUS | Status: DC | PRN
  Administered 2019-02-12: 10:00:00 via INTRAVENOUS

## 2019-02-12 MED ORDER — NA HYALUR & NA CHOND-NA HYALUR 0.55-0.5 ML IO KIT
PACK | INTRAOCULAR | Status: DC | PRN
  Administered 2019-02-12: 1 via OPHTHALMIC

## 2019-02-12 SURGICAL SUPPLY — 17 items
DISSECTOR HYDRO NUCLEUS 50X22 (MISCELLANEOUS) ×8 IMPLANT
DRSG TEGADERM 2-3/8X2-3/4 SM (GAUZE/BANDAGES/DRESSINGS) ×2 IMPLANT
GLOVE BIOGEL M 6.5 STRL (GLOVE) ×2 IMPLANT
GOWN STRL REUS W/ TWL LRG LVL3 (GOWN DISPOSABLE) ×1 IMPLANT
GOWN STRL REUS W/ TWL XL LVL3 (GOWN DISPOSABLE) ×1 IMPLANT
GOWN STRL REUS W/TWL LRG LVL3 (GOWN DISPOSABLE) ×1
GOWN STRL REUS W/TWL XL LVL3 (GOWN DISPOSABLE) ×1
KNIFE 45D UP 2.3 (MISCELLANEOUS) ×2 IMPLANT
LABEL CATARACT MEDS ST (LABEL) ×2 IMPLANT
LENS IOL TECNIS ITEC 15.5 (Intraocular Lens) ×2 IMPLANT
PACK CATARACT (MISCELLANEOUS) ×2 IMPLANT
PACK CATARACT KING (MISCELLANEOUS) ×2 IMPLANT
PACK EYE AFTER SURG (MISCELLANEOUS) ×2 IMPLANT
SOL BSS BAG (MISCELLANEOUS) ×2
SOLUTION BSS BAG (MISCELLANEOUS) ×1 IMPLANT
WATER STERILE IRR 250ML POUR (IV SOLUTION) ×2 IMPLANT
WIPE NON LINTING 3.25X3.25 (MISCELLANEOUS) ×2 IMPLANT

## 2019-02-12 NOTE — Anesthesia Preprocedure Evaluation (Signed)
Anesthesia Evaluation  Patient identified by MRN, date of birth, ID band Patient awake    Reviewed: Allergy & Precautions, NPO status , [name] Chart, lab work & pertinent test results, reviewed documented beta blocker date and time   Airway Mallampati: II  TM Distance: >3 FB     Dental  (+) Chipped   Pulmonary former smoker,           Cardiovascular hypertension, Pt. on medications      Neuro/Psych PSYCHIATRIC DISORDERS Anxiety Depression    GI/Hepatic hiatal hernia, GERD  Controlled,  Endo/Other    Renal/GU      Musculoskeletal  (+) Arthritis ,   Abdominal   Peds  Hematology   Anesthesia Other Findings   Reproductive/Obstetrics                             Anesthesia Physical Anesthesia Plan  ASA: III  Anesthesia Plan: MAC   Post-op Pain Management:    Induction: Intravenous  PONV Risk Score and Plan:   Airway Management Planned: Oral ETT  Additional Equipment:   Intra-op Plan:   Post-operative Plan:   Informed Consent: I have reviewed the patients History and Physical, chart, labs and discussed the procedure including the risks, benefits and alternatives for the proposed anesthesia with the patient or authorized representative who has indicated his/her understanding and acceptance.       Plan Discussed with: CRNA  Anesthesia Plan Comments:         Anesthesia Quick Evaluation

## 2019-02-12 NOTE — Op Note (Signed)
  PREOPERATIVE DIAGNOSIS:  Nuclear sclerotic cataract of the RIGHT eye.   POSTOPERATIVE DIAGNOSIS:  Nuclear sclerotic cataract of the RIGHT eye.   OPERATIVE PROCEDURE: Cataract surgery OD   SURGEON:  Marchia Meiers, MD.   ANESTHESIA:  Anesthesiologist: Gunnar Bulla, MD CRNA: Justus Memory, CRNA  1.      Managed anesthesia care. 2.     0.32ml of Shugarcaine was instilled following the paracentesis   COMPLICATIONS:  None.   TECHNIQUE:   Divide and conquer   DESCRIPTION OF PROCEDURE:  The patient was examined and consented in the preoperative holding area where the aforementioned topical anesthesia was applied to the RIGHT eye and then brought back to the Operating Room where the RIGHT eye was prepped and draped in the usual sterile ophthalmic fashion and a lid speculum was placed. A paracentesis was created with the side port blade, the anterior chamber was washed out with trypan blue to stain the anterior capsule, and the anterior chamber was filled with viscoelastic. A near clear corneal incision was performed with the steel keratome. A continuous curvilinear capsulorrhexis was performed with a cystotome followed by the capsulorrhexis forceps. Hydrodissection and hydrodelineation were carried out with BSS on a blunt cannula. The lens was removed in a divide and conquer  technique and the remaining cortical material was removed with the irrigation-aspiration handpiece. The capsular bag was inflated with viscoelastic and the lens was placed in the capsular bag without complication. The remaining viscoelastic was removed from the eye with the irrigation-aspiration handpiece. The wounds were hydrated. The anterior chamber was flushed and the eye was inflated to physiologic pressure. 0.9ml Vigamox was placed in the anterior chamber. The wounds were found to be water tight. The eye was dressed with Vigamox. The patient was given protective glasses to wear throughout the day and a shield with which  to sleep tonight. The patient was also given drops with which to begin a drop regimen today and will follow-up with me in one day. Implant Name Type Inv. Item Serial No. Manufacturer Lot No. LRB No. Used Action  LENS IOL DIOP 15.5 - [id number] Intraocular Lens LENS IOL DIOP 15.5 [id number] [id number]  Right 1 Implanted    Procedure(s) with comments: CATARACT EXTRACTION PHACO AND INTRAOCULAR LENS PLACEMENT (IOC) (Right) - [place] 01:34.2 CDE 17.97 Fluid Pack lot # [id number]  Electronically signed: Marchia Meiers 02/12/2019 10:57 AM

## 2019-02-12 NOTE — Anesthesia Post-op Follow-up Note (Signed)
Anesthesia QCDR form completed.        

## 2019-02-12 NOTE — H&P (Signed)
   I have reviewed the patient's H&P and agree with its findings. There have been no interval changes.  Brecken Walth MD Ophthalmology 

## 2019-02-12 NOTE — Anesthesia Postprocedure Evaluation (Signed)
Anesthesia Post Note  Patient: Katie Bell  Procedure(s) Performed: CATARACT EXTRACTION PHACO AND INTRAOCULAR LENS PLACEMENT (IOC) (Right Eye)  Patient location during evaluation: PACU Anesthesia Type: MAC Level of consciousness: awake and alert Pain management: pain level controlled Vital Signs Assessment: post-procedure vital signs reviewed and stable Respiratory status: spontaneous breathing, nonlabored ventilation, respiratory function stable and patient connected to nasal cannula oxygen Cardiovascular status: stable and blood pressure returned to baseline Postop Assessment: no apparent nausea or vomiting Anesthetic complications: no     Last Vitals:  Vitals:   02/12/19 0906 02/12/19 1031  BP: (!) 150/65 (!) 124/55  Pulse: 78 63  Resp: 16 16  Temp: 36.8 C (!) 36.2 C  SpO2: 100% 96%    Last Pain:  Vitals:   02/12/19 1031  TempSrc: Temporal  PainSc: 0-No pain                 Chancey Cullinane S

## 2019-02-12 NOTE — Discharge Instructions (Addendum)
Eye Surgery Discharge Instructions  Expect mild scratchy sensation or mild soreness. DO NOT RUB YOUR EYE!  The day of surgery:  Minimal physical activity, but bed rest is not required  No reading, computer work, or close hand work  No bending, lifting, or straining.  May watch TV  For 24 hours:  No driving, legal decisions, or alcoholic beverages  Safety precautions  Eat anything you prefer: It is better to start with liquids, then soup then solid foods.  Solar shield eyeglasses should be worn for comfort in the sunlight/patch while sleeping  Resume all regular medications including aspirin or Coumadin if these were discontinued prior to surgery. You may shower, bathe, shave, or wash your hair. Tylenol may be taken for mild discomfort. Follow eye drop instruction sheet as reviewed.  Call your doctor if you experience significant pain, nausea, or vomiting, fever > 101 or other signs of infection. [phone] or [phone] Specific instructions:  Follow-up Information    Marchia Meiers, MD Follow up.   Specialty: Ophthalmology Why: 02-13-19 @ 9:50 am Contact information: [place] [address] [phone]

## 2019-02-12 NOTE — Transfer of Care (Signed)
Immediate Anesthesia Transfer of Care Note  Patient: Katie Bell  Procedure(s) Performed: CATARACT EXTRACTION PHACO AND INTRAOCULAR LENS PLACEMENT (IOC) (Right Eye)  Patient Location: PACU  Anesthesia Type:MAC combined with regional for post-op pain  Level of Consciousness: awake, alert  and oriented  Airway & Oxygen Therapy: none   Post-op Assessment: Report given to RN  Post vital signs: Reviewed and stable  Last Vitals:  Vitals Value Taken Time  BP    Temp    Pulse    Resp    SpO2      Last Pain:  Vitals:   02/12/19 0906  TempSrc: Oral  PainSc: 0-No pain         Complications: No apparent anesthesia complications

## 2019-03-05 DIAGNOSIS — H2512 Age-related nuclear cataract, left eye: Secondary | ICD-10-CM | POA: Diagnosis not present

## 2019-03-06 ENCOUNTER — Other Ambulatory Visit: Payer: Self-pay | Admitting: Internal Medicine

## 2019-03-06 NOTE — Telephone Encounter (Signed)
Last filled 02-06-19 #30 Last OV 01-20-19 Next OV 03-09-19 [place]

## 2019-03-09 ENCOUNTER — Other Ambulatory Visit: Payer: Self-pay

## 2019-03-09 ENCOUNTER — Encounter: Payer: Self-pay | Admitting: Internal Medicine

## 2019-03-09 ENCOUNTER — Other Ambulatory Visit: Payer: Self-pay | Admitting: Internal Medicine

## 2019-03-09 ENCOUNTER — Ambulatory Visit (INDEPENDENT_AMBULATORY_CARE_PROVIDER_SITE_OTHER): Payer: PPO | Admitting: Internal Medicine

## 2019-03-09 ENCOUNTER — Telehealth: Payer: Self-pay

## 2019-03-09 VITALS — BP 136/70 | HR 69 | Temp 98.3°F | Ht 61.0 in | Wt 113.0 lb

## 2019-03-09 DIAGNOSIS — R109 Unspecified abdominal pain: Secondary | ICD-10-CM | POA: Diagnosis not present

## 2019-03-09 DIAGNOSIS — I10 Essential (primary) hypertension: Secondary | ICD-10-CM

## 2019-03-09 DIAGNOSIS — E871 Hypo-osmolality and hyponatremia: Secondary | ICD-10-CM

## 2019-03-09 DIAGNOSIS — R10A2 Flank pain, left side: Secondary | ICD-10-CM | POA: Insufficient documentation

## 2019-03-09 DIAGNOSIS — E876 Hypokalemia: Secondary | ICD-10-CM

## 2019-03-09 LAB — RENAL FUNCTION PANEL
Albumin: 4.3 g/dL (ref 3.5–5.2)
BUN: 12 mg/dL (ref 6–23)
CO2: 29 mEq/L (ref 19–32)
Calcium: 9.6 mg/dL (ref 8.4–10.5)
Chloride: 103 mEq/L (ref 96–112)
Creatinine, Ser: 1.11 mg/dL (ref 0.40–1.20)
GFR: 47.35 mL/min — ABNORMAL LOW (ref >60.00)
Glucose, Bld: 97 mg/dL (ref 70–99)
Phosphorus: 3.2 mg/dL (ref 2.3–4.6)
Potassium: 2.5 mEq/L — CL (ref 3.5–5.1)
Sodium: 141 mEq/L (ref 135–145)

## 2019-03-09 MED ORDER — POTASSIUM CHLORIDE CRYS ER 20 MEQ PO TBCR: 20.0000 meq | EXTENDED_RELEASE_TABLET | Freq: Two times a day (BID) | ORAL | 11 refills | Status: DC

## 2019-03-09 MED ORDER — POTASSIUM CHLORIDE CRYS ER 10 MEQ PO TBCR: 10.0000 meq | EXTENDED_RELEASE_TABLET | Freq: Two times a day (BID) | ORAL | 11 refills | Status: DC

## 2019-03-09 NOTE — Assessment & Plan Note (Signed)
Likely muscular No action for now

## 2019-03-09 NOTE — Telephone Encounter (Signed)
[facility] called to report a critical result of  Potassium 2.5

## 2019-03-09 NOTE — Assessment & Plan Note (Signed)
BP Readings from Last 3 Encounters:  03/09/19 136/70  02/12/19 (!) 124/55  01/20/19 (!) 166/102   Back under control with amlodipine No apparent side effects

## 2019-03-09 NOTE — Assessment & Plan Note (Addendum)
Will recheck to make sure electrolytes still okay Related to ARB/HCTZ

## 2019-03-09 NOTE — Progress Notes (Signed)
Subjective:    Patient ID: Katie Bell, female    DOB: 1940-11-23, 78 y.o.   MRN: [id number]  HPI Here for follow up of HTN --with change in Rx due to severe hyponatremia  Doing okay on new BP med Not checking at home No headache No leg swelling No chest pain or SOB  Current Outpatient Medications on File Prior to Visit  Medication Sig Dispense Refill   amLODipine (NORVASC) 5 MG tablet TAKE 1 TABLET BY MOUTH ONCE DAILY 30 tablet 0   Cholecalciferol (VITAMIN D) 400 UNITS capsule Take 800 Units by mouth daily.     diazepam (VALIUM) 10 MG tablet TAKE 1/2 TABLET BY MOUTH TWICE DAILY AS NEEDED FOR ANXIETY OR SLEEP 30 tablet 0   Omega-3 Fatty Acids (FISH OIL) 1000 MG CAPS Take 1,000 mg by mouth daily.      vitamin C (ASCORBIC ACID) 500 MG tablet Take 500 mg by mouth daily.     No current facility-administered medications on file prior to visit.     Allergies  Allergen Reactions   Sulfamethoxazole-Trimethoprim Swelling    Angioedema and pruritis per 01/01/2017 documentation in care everywhere   Estradiol Other (See Comments)    Vaginal irritation   Morphine     agitation   Morphine And Related Anxiety    Past Medical History:  Diagnosis Date   Anxiety    Cancer ([place]) 2015   Depression    Episodic mood disorder ([place])    mostly anxiety--depression in the past   Gastric ulcer    History   GERD (gastroesophageal reflux disease)    History of blood transfusion    At [facility] appro 15 yrs ago   History of hiatal hernia    History of kidney stones 1960   Hypertension    Osteoarthritis, hand    hand   Postmenopausal disorder    SVD (spontaneous vaginal delivery)    x 3    Past Surgical History:  Procedure Laterality Date   CATARACT EXTRACTION W/PHACO Right 02/12/2019   Procedure: CATARACT EXTRACTION PHACO AND INTRAOCULAR LENS PLACEMENT ([place]);  Surgeon: Marchia Meiers, MD;  Location: [facility];  Service: Ophthalmology;   Laterality: Right;  [place] 01:34.2 CDE 17.97 Fluid Pack lot # [id number]   CHOLECYSTECTOMY  2008   COLONOSCOPY  04/2011   CYSTOCELE REPAIR N/A 12/31/2013   Procedure: ANTERIOR REPAIR (CYSTOCELE);  Surgeon: Donnamae Jude, MD;  Location: [place] [facility];  Service: Gynecology;  Laterality: N/A;   CYSTOSCOPY W/ URETERAL STENT PLACEMENT Left 01/16/2016   Procedure: CYSTOSCOPY WITH STENT REPLACEMENT;  Surgeon: Hollice Espy, MD;  Location: [facility];  Service: Urology;  Laterality: Left;   CYSTOSCOPY WITH STENT PLACEMENT Left 12/23/2015   Procedure: CYSTOSCOPY WITH STENT PLACEMENT;  Surgeon: Ardis Hughs, MD;  Location: [facility];  Service: Urology;  Laterality: Left;   DIAGNOSTIC LAPAROSCOPY     kidney stone removed     MELANOMA EXCISION  2015   left posterior thigh   TONSILLECTOMY  1944   TUBAL LIGATION     UPPER GI ENDOSCOPY     URETEROSCOPY WITH HOLMIUM LASER LITHOTRIPSY Left 01/16/2016   Procedure: URETEROSCOPY WITH HOLMIUM LASER LITHOTRIPSY;  Surgeon: Hollice Espy, MD;  Location: [facility];  Service: Urology;  Laterality: Left;   WISDOM TOOTH EXTRACTION      Family History  Problem Relation Age of Onset   Alzheimer's disease Mother    Alcohol abuse Father    COPD Brother  Heart disease Neg Hx    Cancer Neg Hx     Social History   Socioeconomic History   Marital status: Divorced    Spouse name: Not on file   Number of children: 3   Years of education: Not on file   Highest education level: Not on file  Occupational History   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  Social Needs   Financial resource strain: Not on file   Food insecurity    Worry: Not on file    Inability: Not on file   Transportation needs    Medical: Not on file    Non-medical: Not on file  Tobacco Use   Smoking status: Former Smoker   Smokeless tobacco: Never Used  Substance and Sexual Activity   Alcohol use: No    Alcohol/week: 0.0 standard drinks    Drug use: No   Sexual activity: Yes    Birth control/protection: Post-menopausal  Lifestyle   Physical activity    Days per week: Not on file    Minutes per session: Not on file   Stress: Not on file  Relationships   Social connections    Talks on phone: Not on file    Gets together: Not on file    Attends religious service: Not on file    Active member of club or organization: Not on file    Attends meetings of clubs or organizations: Not on file    Relationship status: Not on file   Intimate partner violence    Fear of current or ex partner: Not on file    Emotionally abused: Not on file    Physically abused: Not on file    Forced sexual activity: Not on file  Other Topics Concern   Not on file  Social History Narrative   3 sons--- middle one lives with her   [name]      Has living will   Son [name]. Then son [name]   Would accept resuscitation attempts but no prolonged ventilation   No tube feeds if cognitively unaware      Review of Systems  Weight is down some more--not eating as much Due for other cataract  Sleeps well occ ache in left low back at times--mostly in bed. No joint pain No groin pain or hematuria    Objective:   Physical Exam  Constitutional: She appears well-developed. No distress.  Neck: No thyromegaly present.  Cardiovascular: Normal rate, regular rhythm, normal heart sounds and intact distal pulses. Exam reveals no gallop.  No murmur heard. Respiratory: Effort normal and breath sounds normal. No respiratory distress. She has no wheezes. She has no rales.  Musculoskeletal:     Comments: No back tenderness Normal ROM in hips  Lymphadenopathy:    She has no cervical adenopathy.  Psychiatric: She has a normal mood and affect. Her behavior is normal.           Assessment & Plan:

## 2019-03-09 NOTE — Telephone Encounter (Signed)
Please call her Her sodium is normal still but the potassium is low still The reason for this is not clear cut---make sure she is not taking any of her old BP meds Send Rx for KCl 96meq twice a day (#60 x 11) We need to check the potassium again on Thursday--set this up It may be worth seeing a kidney specialist (they handle electrolyte problems also)--- I can set this up if she is willing to go

## 2019-03-09 NOTE — Telephone Encounter (Signed)
Spoke to pt. Made lab appt for Thursday the 19th at 10:30. I will send the rx to [facility]. She said she was willing to go to a kidney specialist but it would have to be after her eye surgery.

## 2019-03-09 NOTE — Addendum Note (Signed)
Addended by: Pilar Grammes on: 03/09/2019 04:24 PM   Modules accepted: Orders

## 2019-03-09 NOTE — Telephone Encounter (Signed)
Let her know I put in the referral but asked for it after 12/3

## 2019-03-09 NOTE — Addendum Note (Signed)
Addended by: Viviana Simpler I on: 03/09/2019 04:34 PM   Modules accepted: Orders

## 2019-03-11 ENCOUNTER — Encounter: Payer: Self-pay | Admitting: *Deleted

## 2019-03-12 ENCOUNTER — Other Ambulatory Visit (INDEPENDENT_AMBULATORY_CARE_PROVIDER_SITE_OTHER): Payer: PPO

## 2019-03-12 DIAGNOSIS — E876 Hypokalemia: Secondary | ICD-10-CM | POA: Diagnosis not present

## 2019-03-12 LAB — POTASSIUM: Potassium: 3.2 mEq/L — ABNORMAL LOW (ref 3.5–5.1)

## 2019-03-23 ENCOUNTER — Other Ambulatory Visit
Admission: RE | Admit: 2019-03-23 | Discharge: 2019-03-23 | Disposition: A | Discharge status: Home / Self Care | Payer: PPO | Source: Ambulatory Visit | Attending: Ophthalmology | Admitting: Ophthalmology

## 2019-03-23 ENCOUNTER — Other Ambulatory Visit: Payer: Self-pay

## 2019-03-23 DIAGNOSIS — Z01812 Encounter for preprocedural laboratory examination: Secondary | ICD-10-CM | POA: Insufficient documentation

## 2019-03-23 DIAGNOSIS — Z20828 Contact with and (suspected) exposure to other viral communicable diseases: Secondary | ICD-10-CM | POA: Insufficient documentation

## 2019-03-23 LAB — SARS CORONAVIRUS 2 (TAT 6-24 HRS): SARS Coronavirus 2: NEGATIVE

## 2019-03-26 ENCOUNTER — Other Ambulatory Visit: Payer: Self-pay

## 2019-03-26 ENCOUNTER — Ambulatory Visit: Payer: PPO | Admitting: Anesthesiology

## 2019-03-26 ENCOUNTER — Encounter: Payer: Self-pay | Admitting: *Deleted

## 2019-03-26 ENCOUNTER — Ambulatory Visit
Admission: RE | Admit: 2019-03-26 | Discharge: 2019-03-26 | Disposition: A | Discharge status: Home / Self Care | Payer: PPO | Attending: Ophthalmology | Admitting: Ophthalmology

## 2019-03-26 ENCOUNTER — Encounter
Admission: RE | Disposition: A | Discharge status: Home / Self Care | Payer: Self-pay | Source: Home / Self Care | Attending: Ophthalmology

## 2019-03-26 DIAGNOSIS — M19049 Primary osteoarthritis, unspecified hand: Secondary | ICD-10-CM | POA: Insufficient documentation

## 2019-03-26 DIAGNOSIS — H2512 Age-related nuclear cataract, left eye: Secondary | ICD-10-CM | POA: Diagnosis not present

## 2019-03-26 DIAGNOSIS — Z87891 Personal history of nicotine dependence: Secondary | ICD-10-CM | POA: Diagnosis not present

## 2019-03-26 DIAGNOSIS — I10 Essential (primary) hypertension: Secondary | ICD-10-CM | POA: Diagnosis not present

## 2019-03-26 DIAGNOSIS — Z79899 Other long term (current) drug therapy: Secondary | ICD-10-CM | POA: Diagnosis not present

## 2019-03-26 DIAGNOSIS — Z882 Allergy status to sulfonamides status: Secondary | ICD-10-CM | POA: Insufficient documentation

## 2019-03-26 DIAGNOSIS — F418 Other specified anxiety disorders: Secondary | ICD-10-CM | POA: Insufficient documentation

## 2019-03-26 DIAGNOSIS — Z885 Allergy status to narcotic agent status: Secondary | ICD-10-CM | POA: Diagnosis not present

## 2019-03-26 DIAGNOSIS — Z888 Allergy status to other drugs, medicaments and biological substances status: Secondary | ICD-10-CM | POA: Diagnosis not present

## 2019-03-26 DIAGNOSIS — K219 Gastro-esophageal reflux disease without esophagitis: Secondary | ICD-10-CM | POA: Diagnosis not present

## 2019-03-26 HISTORY — PX: CATARACT EXTRACTION W/PHACO: SHX586

## 2019-03-26 SURGERY — PHACOEMULSIFICATION, CATARACT, WITH IOL INSERTION
Anesthesia: Monitor Anesthesia Care | Site: Eye | Laterality: Left

## 2019-03-26 MED ORDER — TRYPAN BLUE 0.06 % OP SOLN
OPHTHALMIC | Status: DC | PRN
  Administered 2019-03-26: 0.5 mL via INTRAOCULAR

## 2019-03-26 MED ORDER — LIDOCAINE HCL (PF) 4 % IJ SOLN
INTRAOCULAR | Status: DC | PRN
  Administered 2019-03-26: 4 mL via OPHTHALMIC

## 2019-03-26 MED ORDER — SODIUM CHLORIDE 0.9 % IV SOLN
INTRAVENOUS | Status: DC
  Administered 2019-03-26: 07:00:00 via INTRAVENOUS

## 2019-03-26 MED ORDER — ARMC OPHTHALMIC DILATING DROPS
1.0000 "application " | OPHTHALMIC | Status: AC
  Administered 2019-03-26 (×3): 1 via OPHTHALMIC

## 2019-03-26 MED ORDER — CARBACHOL 0.01 % IO SOLN
INTRAOCULAR | Status: DC | PRN
  Administered 2019-03-26: 0.5 mL via INTRAOCULAR

## 2019-03-26 MED ORDER — BSS IO SOLN
INTRAOCULAR | Status: DC | PRN
  Administered 2019-03-26: 15 mL via INTRAOCULAR

## 2019-03-26 MED ORDER — FENTANYL CITRATE (PF) 100 MCG/2ML IJ SOLN
INTRAMUSCULAR | Status: AC
  Filled 2019-03-26: qty 2

## 2019-03-26 MED ORDER — NEOMYCIN-POLYMYXIN-DEXAMETH 3.5-10000-0.1 OP OINT
TOPICAL_OINTMENT | OPHTHALMIC | Status: DC | PRN
  Administered 2019-03-26: 1 via OPHTHALMIC

## 2019-03-26 MED ORDER — MOXIFLOXACIN HCL 0.5 % OP SOLN
OPHTHALMIC | Status: DC | PRN
  Administered 2019-03-26: 0.2 mL via OPHTHALMIC

## 2019-03-26 MED ORDER — ONDANSETRON HCL 4 MG/2ML IJ SOLN
INTRAMUSCULAR | Status: DC | PRN
  Administered 2019-03-26: 4 mg via INTRAVENOUS

## 2019-03-26 MED ORDER — MOXIFLOXACIN HCL 0.5 % OP SOLN - NO CHARGE
1.0000 [drp] | Freq: Once | OPHTHALMIC | Status: DC
  Filled 2019-03-26: qty 3

## 2019-03-26 MED ORDER — MIDAZOLAM HCL 2 MG/2ML IJ SOLN
INTRAMUSCULAR | Status: DC | PRN
  Administered 2019-03-26 (×2): 1 mg via INTRAVENOUS

## 2019-03-26 MED ORDER — MIDAZOLAM HCL 2 MG/2ML IJ SOLN
INTRAMUSCULAR | Status: AC
  Filled 2019-03-26: qty 2

## 2019-03-26 MED ORDER — PHENYLEPHRINE HCL 10 % OP SOLN
OPHTHALMIC | Status: AC
  Administered 2019-03-26: 1 [drp]
  Filled 2019-03-26: qty 5

## 2019-03-26 MED ORDER — TETRACAINE HCL 0.5 % OP SOLN
1.0000 [drp] | OPHTHALMIC | Status: DC | PRN
  Administered 2019-03-26: 07:00:00 1 [drp] via OPHTHALMIC

## 2019-03-26 MED ORDER — EPINEPHRINE PF 1 MG/ML IJ SOLN
INTRAOCULAR | Status: DC | PRN
  Administered 2019-03-26: 08:00:00 via OPHTHALMIC

## 2019-03-26 MED ORDER — CYCLOPENTOLATE HCL 2 % OP SOLN
OPHTHALMIC | Status: AC
  Administered 2019-03-26: 1 [drp]
  Filled 2019-03-26: qty 2

## 2019-03-26 MED ORDER — TETRACAINE HCL 0.5 % OP SOLN
OPHTHALMIC | Status: AC
  Administered 2019-03-26: 1 [drp] via OPHTHALMIC
  Filled 2019-03-26: qty 4

## 2019-03-26 MED ORDER — ONDANSETRON HCL 4 MG/2ML IJ SOLN
INTRAMUSCULAR | Status: AC
  Filled 2019-03-26: qty 2

## 2019-03-26 MED ORDER — ARMC OPHTHALMIC DILATING DROPS
OPHTHALMIC | Status: AC
  Administered 2019-03-26: 1 via OPHTHALMIC
  Filled 2019-03-26: qty 0.5

## 2019-03-26 MED ORDER — PROVISC 10 MG/ML IO SOLN
INTRAOCULAR | Status: DC | PRN
  Administered 2019-03-26: .85 mL via INTRAOCULAR

## 2019-03-26 MED ORDER — NA CHONDROIT SULF-NA HYALURON 40-17 MG/ML IO SOLN
INTRAOCULAR | Status: DC | PRN
  Administered 2019-03-26: 1 mL via INTRAOCULAR

## 2019-03-26 MED ORDER — FENTANYL CITRATE (PF) 100 MCG/2ML IJ SOLN
INTRAMUSCULAR | Status: DC | PRN
  Administered 2019-03-26 (×2): 50 ug via INTRAVENOUS

## 2019-03-26 MED ORDER — MOXIFLOXACIN HCL 0.5 % OP SOLN
OPHTHALMIC | Status: AC
  Filled 2019-03-26: qty 3

## 2019-03-26 MED ORDER — POVIDONE-IODINE 5 % OP SOLN
OPHTHALMIC | Status: DC | PRN
  Administered 2019-03-26: 1 via OPHTHALMIC

## 2019-03-26 SURGICAL SUPPLY — 19 items
BNDG EYE OVAL (GAUZE/BANDAGES/DRESSINGS) ×1 IMPLANT
DISSECTOR HYDRO NUCLEUS 50X22 (MISCELLANEOUS) ×8 IMPLANT
DRSG TEGADERM 2-3/8X2-3/4 SM (GAUZE/BANDAGES/DRESSINGS) ×2 IMPLANT
GLOVE BIOGEL M 6.5 STRL (GLOVE) ×2 IMPLANT
GOWN STRL REUS W/ TWL LRG LVL3 (GOWN DISPOSABLE) ×1 IMPLANT
GOWN STRL REUS W/ TWL XL LVL3 (GOWN DISPOSABLE) ×1 IMPLANT
GOWN STRL REUS W/TWL LRG LVL3 (GOWN DISPOSABLE) ×1
GOWN STRL REUS W/TWL XL LVL3 (GOWN DISPOSABLE) ×1
KNIFE 45D UP 2.3 (MISCELLANEOUS) ×2 IMPLANT
LABEL CATARACT MEDS ST (LABEL) ×2 IMPLANT
LENS IOL TECNIS ITEC 16.0 (Intraocular Lens) ×1 IMPLANT
PACK CATARACT (MISCELLANEOUS) ×2 IMPLANT
PACK CATARACT KING (MISCELLANEOUS) ×2 IMPLANT
PACK EYE AFTER SURG (MISCELLANEOUS) ×2 IMPLANT
SHIELD EYE LENSE ONLY DISP (GAUZE/BANDAGES/DRESSINGS) ×1 IMPLANT
SOL BSS BAG (MISCELLANEOUS) ×2
SOLUTION BSS BAG (MISCELLANEOUS) ×1 IMPLANT
WATER STERILE IRR 250ML POUR (IV SOLUTION) ×2 IMPLANT
WIPE NON LINTING 3.25X3.25 (MISCELLANEOUS) ×2 IMPLANT

## 2019-03-26 NOTE — H&P (Signed)
   I have reviewed the patient's H&P and agree with its findings. There have been no interval changes.  Martino Tompson MD Ophthalmology 

## 2019-03-26 NOTE — Anesthesia Postprocedure Evaluation (Signed)
Anesthesia Post Note  Patient: Katie Bell  Procedure(s) Performed: CATARACT EXTRACTION PHACO AND INTRAOCULAR LENS PLACEMENT ([place]) LEFT VISION BLUE (Left Eye)  Patient location during evaluation: Phase II Anesthesia Type: MAC Level of consciousness: awake and alert and oriented Pain management: pain level controlled Vital Signs Assessment: post-procedure vital signs reviewed and stable Respiratory status: spontaneous breathing Cardiovascular status: blood pressure returned to baseline Postop Assessment: no headache Anesthetic complications: no     Last Vitals:  Vitals:   03/26/19 0856 03/26/19 0858  BP: (!) 164/95 (!) 164/95  Pulse: 70 72  Resp: 16 16  Temp: (!) 36.2 C (!) 36.2 C  SpO2: 97% 98%    Last Pain:  Vitals:   03/26/19 0858  TempSrc: Temporal  PainSc: 0-No pain                 Alison Stalling

## 2019-03-26 NOTE — Anesthesia Post-op Follow-up Note (Signed)
Anesthesia QCDR form completed.        

## 2019-03-26 NOTE — Anesthesia Preprocedure Evaluation (Signed)
Anesthesia Evaluation  Patient identified by MRN, date of birth, ID band Patient awake    Reviewed: Allergy & Precautions, H&P , NPO status , reviewed documented beta blocker date and time   Airway Mallampati: II  TM Distance: >3 FB Neck ROM: full    Dental  (+) Caps, Chipped   Pulmonary former smoker,    Pulmonary exam normal        Cardiovascular hypertension, Normal cardiovascular exam     Neuro/Psych PSYCHIATRIC DISORDERS Anxiety Depression    GI/Hepatic hiatal hernia, PUD, GERD  Medicated and Controlled,  Endo/Other    Renal/GU      Musculoskeletal  (+) Arthritis ,   Abdominal   Peds  Hematology   Anesthesia Other Findings Past Medical History: No date: Anxiety 2015: Cancer ([place])     Comment:  basal cell removed from leg No date: Depression No date: Episodic mood disorder ([place])     Comment:  mostly anxiety--depression in the past No date: Gastric ulcer     Comment:  History No date: GERD (gastroesophageal reflux disease) No date: History of blood transfusion     Comment:  At [facility] appro [facility] yrs ago No date: History of hiatal hernia 1960: History of kidney stones No date: Hypertension No date: Osteoarthritis, hand     Comment:  hand No date: Postmenopausal disorder No date: SVD (spontaneous vaginal delivery)     Comment:  x 3  Past Surgical History: 02/12/2019: CATARACT EXTRACTION W/PHACO; Right     Comment:  Procedure: CATARACT EXTRACTION PHACO AND INTRAOCULAR               LENS PLACEMENT ([place]);  Surgeon: Marchia Meiers, MD;                Location: [facility];  Service: Ophthalmology;  Laterality:              Right;  [place] 01:34.2 CDE 17.97 Fluid Pack lot # [id number] 2008: CHOLECYSTECTOMY 04/2011: COLONOSCOPY 12/31/2013: CYSTOCELE REPAIR; N/A     Comment:  Procedure: ANTERIOR REPAIR (CYSTOCELE);  Surgeon: Donnamae Jude, MD;  Location: [facility];  Service: Gynecology;                 Laterality: N/A; 01/16/2016: CYSTOSCOPY W/ URETERAL STENT PLACEMENT; Left     Comment:  Procedure: CYSTOSCOPY WITH STENT REPLACEMENT;  Surgeon:               Hollice Espy, MD;  Location: [facility];  Service:               Urology;  Laterality: Left; 12/23/2015: CYSTOSCOPY WITH STENT PLACEMENT; Left     Comment:  Procedure: CYSTOSCOPY WITH STENT PLACEMENT;  Surgeon:               Ardis Hughs, MD;  Location: [facility];  Service:               Urology;  Laterality: Left; No date: DIAGNOSTIC LAPAROSCOPY No date: EYE SURGERY No date: kidney stone removed 2015: MELANOMA EXCISION     Comment:  left posterior thigh 1944: TONSILLECTOMY No date: TUBAL LIGATION No date: UPPER GI ENDOSCOPY 01/16/2016: URETEROSCOPY WITH HOLMIUM LASER LITHOTRIPSY; Left     Comment:  Procedure: URETEROSCOPY WITH HOLMIUM LASER LITHOTRIPSY;               Surgeon: Hollice Espy, MD;  Location: [facility];  Service: Urology;  Laterality: Left; No date: WISDOM TOOTH EXTRACTION  BMI    Body Mass Index: 21.66 kg/m      Reproductive/Obstetrics                             Anesthesia Physical Anesthesia Plan  ASA: III  Anesthesia Plan: MAC   Post-op Pain Management:    Induction: Intravenous  PONV Risk Score and Plan: Treatment may vary due to age or medical condition and TIVA  Airway Management Planned: Nasal Cannula and Natural Airway  Additional Equipment:   Intra-op Plan:   Post-operative Plan:   Informed Consent: I have reviewed the patients History and Physical, chart, labs and discussed the procedure including the risks, benefits and alternatives for the proposed anesthesia with the patient or authorized representative who has indicated his/her understanding and acceptance.     Dental Advisory Given  Plan Discussed with: CRNA  Anesthesia Plan Comments:         Anesthesia Quick Evaluation

## 2019-03-26 NOTE — Discharge Instructions (Signed)

## 2019-03-26 NOTE — Op Note (Signed)
  PREOPERATIVE DIAGNOSIS:  Nuclear sclerotic cataract of the LEFT eye.   POSTOPERATIVE DIAGNOSIS:  Nuclear sclerotic cataract of the LEFT eye.   OPERATIVE PROCEDURE: Cataract surgery OS   SURGEON:  Marchia Meiers, MD.   ANESTHESIA:  Anesthesiologist: Alphonsus Sias, MD CRNA: Doreen Salvage, CRNA; Disser, Einar Grad, CRNA  1.      Managed anesthesia care. 2.     0.24ml of Shugarcaine was instilled following the paracentesis   COMPLICATIONS:  None.   TECHNIQUE:   Divide and conquer   DESCRIPTION OF PROCEDURE:  The patient was examined and consented in the preoperative holding area where the aforementioned topical anesthesia was applied to the LEFT eye and then brought back to the Operating Room where the left eye was prepped and draped in the usual sterile ophthalmic fashion and a lid speculum was placed. A paracentesis was created with the side port blade, the anterior chamber was washed out with trypan blue to stain the anterior capsule, and the anterior chamber was filled with viscoelastic. A near clear corneal incision was performed with the steel keratome. A continuous curvilinear capsulorrhexis was performed with a cystotome followed by the capsulorrhexis forceps. Hydrodissection and hydrodelineation were carried out with BSS on a blunt cannula. The lens was removed in a divide and conquer  technique and the remaining cortical material was removed with the irrigation-aspiration handpiece. The capsular bag was inflated with viscoelastic and the lens was placed in the capsular bag without complication. The remaining viscoelastic was removed from the eye with the irrigation-aspiration handpiece. The wounds were hydrated. The anterior chamber was flushed and the eye was inflated to physiologic pressure. 0.21ml Vigamox was placed in the anterior chamber. The wounds were found to be water tight. The eye was dressed with Vigamox. The patient was given protective glasses to wear throughout the day and a  shield with which to sleep tonight. The patient was also given drops with which to begin a drop regimen today and will follow-up with me in one day. Implant Name Type Inv. Item Serial No. Manufacturer Lot No. LRB No. Used Action  LENS IOL DIOP 16.0 - [id number] Intraocular Lens LENS IOL DIOP 16.0 [id number] AMO  Left 1 Implanted    Procedure(s) with comments: CATARACT EXTRACTION PHACO AND INTRAOCULAR LENS PLACEMENT (IOC) LEFT VISION BLUE (Left) - [place] 01:16.2 CDE 11.70 FLUID PACK LOT # [id number]  Electronically signed: Marchia Meiers 03/26/2019 11:40 AM

## 2019-03-26 NOTE — Transfer of Care (Signed)
Immediate Anesthesia Transfer of Care Note  Patient: GLENYS SNADER  Procedure(s) Performed: Procedure(s) with comments: CATARACT EXTRACTION PHACO AND INTRAOCULAR LENS PLACEMENT (IOC) LEFT VISION BLUE (Left) - [place] 01:16.2 CDE 11.70 FLUID PACK LOT # [id number]  Patient Location: PHASE II  Anesthesia Type:MAC  Level of Consciousness: Awake, Alert, Oriented  Airway & Oxygen Therapy: Patient Spontanous Breathing and Patient on room air   Post-op Assessment: Report given to RN and Post -op Vital signs reviewed and stable  Post vital signs: Reviewed and stable  Last Vitals:  Vitals:   03/26/19 0856 03/26/19 0858  BP: (!) 164/95 (!) 164/95  Pulse: 70 72  Resp: 16 16  Temp: (!) 36.2 C (!) 36.2 C  SpO2: 63% 84%    Complications: No apparent anesthesia complications

## 2019-03-27 ENCOUNTER — Encounter: Payer: Self-pay | Admitting: Ophthalmology

## 2019-03-31 ENCOUNTER — Other Ambulatory Visit: Payer: Self-pay | Admitting: Internal Medicine

## 2019-03-31 NOTE — Telephone Encounter (Signed)
Diazepam last filled 03-06-19 #30 Last OV 03-09-19 Next OV 09-14-19 [place]

## 2019-04-28 ENCOUNTER — Other Ambulatory Visit: Payer: Self-pay | Admitting: Internal Medicine

## 2019-04-30 ENCOUNTER — Other Ambulatory Visit: Payer: Self-pay | Admitting: Internal Medicine

## 2019-04-30 NOTE — Telephone Encounter (Signed)
Last filled 04-03-19 #30 Last OV 03-09-19 Next OV 09-14-19 [place]

## 2019-05-28 DIAGNOSIS — D225 Melanocytic nevi of trunk: Secondary | ICD-10-CM | POA: Diagnosis not present

## 2019-05-28 DIAGNOSIS — D1801 Hemangioma of skin and subcutaneous tissue: Secondary | ICD-10-CM | POA: Diagnosis not present

## 2019-05-28 DIAGNOSIS — D2230 Melanocytic nevi of unspecified part of face: Secondary | ICD-10-CM | POA: Diagnosis not present

## 2019-05-28 DIAGNOSIS — D229 Melanocytic nevi, unspecified: Secondary | ICD-10-CM | POA: Diagnosis not present

## 2019-05-28 DIAGNOSIS — Z85820 Personal history of malignant melanoma of skin: Secondary | ICD-10-CM | POA: Diagnosis not present

## 2019-05-28 DIAGNOSIS — L578 Other skin changes due to chronic exposure to nonionizing radiation: Secondary | ICD-10-CM | POA: Diagnosis not present

## 2019-05-28 DIAGNOSIS — Z1283 Encounter for screening for malignant neoplasm of skin: Secondary | ICD-10-CM | POA: Diagnosis not present

## 2019-05-28 DIAGNOSIS — L821 Other seborrheic keratosis: Secondary | ICD-10-CM | POA: Diagnosis not present

## 2019-05-28 DIAGNOSIS — L814 Other melanin hyperpigmentation: Secondary | ICD-10-CM | POA: Diagnosis not present

## 2019-05-29 ENCOUNTER — Other Ambulatory Visit: Payer: Self-pay | Admitting: Internal Medicine

## 2019-05-29 NOTE — Telephone Encounter (Signed)
Last filled 05-01-19 #30 Last OV 03-09-19 Next OV 09-14-19 [place]

## 2019-06-17 ENCOUNTER — Encounter: Payer: PPO | Admitting: Internal Medicine

## 2019-06-18 DIAGNOSIS — L720 Epidermal cyst: Secondary | ICD-10-CM | POA: Diagnosis not present

## 2019-06-18 DIAGNOSIS — D485 Neoplasm of uncertain behavior of skin: Secondary | ICD-10-CM | POA: Diagnosis not present

## 2019-06-18 DIAGNOSIS — Z85820 Personal history of malignant melanoma of skin: Secondary | ICD-10-CM | POA: Diagnosis not present

## 2019-06-18 DIAGNOSIS — L820 Inflamed seborrheic keratosis: Secondary | ICD-10-CM | POA: Diagnosis not present

## 2019-06-18 DIAGNOSIS — L905 Scar conditions and fibrosis of skin: Secondary | ICD-10-CM | POA: Diagnosis not present

## 2019-06-29 ENCOUNTER — Other Ambulatory Visit: Payer: Self-pay | Admitting: Internal Medicine

## 2019-06-29 NOTE — Telephone Encounter (Signed)
Last filled 06-01-19 #30 Last OV 03-09-19 Next OV 09-14-19 [place]

## 2019-07-07 DIAGNOSIS — E876 Hypokalemia: Secondary | ICD-10-CM | POA: Diagnosis not present

## 2019-07-07 DIAGNOSIS — N1831 Chronic kidney disease, stage 3a: Secondary | ICD-10-CM | POA: Diagnosis not present

## 2019-07-16 ENCOUNTER — Telehealth: Payer: Self-pay

## 2019-07-16 NOTE — Telephone Encounter (Signed)
Her potassium had been very low so I am concerned about her not taking the supplements.  See if she can try an over the counter potassium without getting sick. If so, she should take it twice a day.  I will recheck her potassium at her next appt in May

## 2019-07-16 NOTE — Telephone Encounter (Signed)
Spoke to pt. She will get an OTC potassium tomorrow and try it.

## 2019-07-16 NOTE — Telephone Encounter (Signed)
Patient called today stating that she took her Potassium supplement medication about 2 weeks ago and it made her sick to her stomach with abdominal pain and continues diarrhea. Patient did not take anymore of this since then and wanted to just increase her potassium rich foods in her diet instead.  Patient originally left a message on triage line stating she took Potassium 60 meq. When I called back and spoke to her she looked at her bottle and said she took 1 tablet of 10 meq only. I advised patient that she saw Dr Silvio Pate on 03/09/2019 and at that time was advised to continue potasium supplements daily. Patient kept saying that she was not going to take medication that makes her sick and that she has not been taking this for months but just took it one time about 2 weeks ago when Dr Silvio Pate told her to. I advised patient that she has not seen or spoken with anyone from this office in that time range per our record but I see she saw Dr Candiss Norse on 07/07/19 and if that is who told her to take this medication. Patient said yes she was told she needs to take this supplement. Patient could not answer me when I asked if she was taking this medication after her visit in November but kept saying I took it 2 weeks ago. I am not sure what patient's baseline is but sounded like she was not clear on the information-especially time frame, she was provided.

## 2019-07-23 ENCOUNTER — Other Ambulatory Visit: Payer: Self-pay | Admitting: Internal Medicine

## 2019-07-27 ENCOUNTER — Other Ambulatory Visit: Payer: Self-pay | Admitting: Internal Medicine

## 2019-07-28 NOTE — Telephone Encounter (Signed)
Last filled 06-29-19 #30 Last OV 03-09-19 Next OV 09-14-19 [place]

## 2019-08-06 ENCOUNTER — Ambulatory Visit (INDEPENDENT_AMBULATORY_CARE_PROVIDER_SITE_OTHER): Payer: Medicare HMO | Admitting: Internal Medicine

## 2019-08-06 ENCOUNTER — Encounter: Payer: Self-pay | Admitting: Internal Medicine

## 2019-08-06 ENCOUNTER — Other Ambulatory Visit: Payer: Self-pay

## 2019-08-06 VITALS — BP 138/88 | HR 78 | Temp 97.4°F | Ht 61.0 in | Wt 112.0 lb

## 2019-08-06 DIAGNOSIS — F39 Unspecified mood [affective] disorder: Secondary | ICD-10-CM | POA: Diagnosis not present

## 2019-08-06 DIAGNOSIS — G4452 New daily persistent headache (NDPH): Secondary | ICD-10-CM

## 2019-08-06 DIAGNOSIS — R519 Headache, unspecified: Secondary | ICD-10-CM | POA: Insufficient documentation

## 2019-08-06 NOTE — Assessment & Plan Note (Signed)
Along with tinnitus--but no vestibular symptoms Neurologic exam is normal She asks--- "Is this my nerves?"----and it appears to be Discussed using the diazepam when upset Asked her to try tylenol as well

## 2019-08-06 NOTE — Assessment & Plan Note (Signed)
Chronic anxiety and now grieving brother Really distressed SSRI not appropriate at this time Limited social reserves (son lives with her but doesn't talk to her). Has cousin--may be able to talk to her Discussed counseling---she wasn't definitive (but I will make referral and see if she gets something out of it)
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
## 2019-08-06 NOTE — Patient Instructions (Signed)
Please continue to use the diazepam at bedtime and even during the day---if you are very upset. Try tylenol 650mg  up to three times a day for the headache. Make sure you are eating and drinking enough. Someone will contact you about speaking to the counselor.

## 2019-08-11 ENCOUNTER — Ambulatory Visit: Payer: Medicare HMO | Admitting: Dermatology

## 2019-08-11 ENCOUNTER — Telehealth: Payer: Self-pay

## 2019-08-11 ENCOUNTER — Other Ambulatory Visit: Payer: Self-pay

## 2019-08-11 DIAGNOSIS — D485 Neoplasm of uncertain behavior of skin: Secondary | ICD-10-CM

## 2019-08-11 DIAGNOSIS — L720 Epidermal cyst: Secondary | ICD-10-CM | POA: Diagnosis not present

## 2019-08-11 MED ORDER — MUPIROCIN 2 % EX OINT: 1.0000 "application " | TOPICAL_OINTMENT | Freq: Every day | CUTANEOUS | 0 refills | Status: DC

## 2019-08-11 NOTE — Progress Notes (Signed)
   Follow-Up Visit   Subjective  Katie Bell is a 79 y.o. female who presents for the following: Cyst (left mandible - excise today).    The following portions of the chart were reviewed this encounter and updated as appropriate: Tobacco  Allergies  Meds  Problems  Med Hx  Surg Hx  Fam Hx      Review of Systems: No other skin or systemic complaints.  Objective  Well appearing patient in no apparent distress; mood and affect are within normal limits.  A focused examination was performed including face. Relevant physical exam findings are noted in the Assessment and Plan.  Objective  Left Mandible: 1.2 cm Cystic papule  Assessment & Plan  Neoplasm of uncertain behavior of skin Left Mandible  Skin excision  Lesion length (cm):  1.2 Lesion width (cm):  1.2 Margin per side (cm):  0.3 Total excision diameter (cm):  1.8 Informed consent: discussed and consent obtained   Timeout: patient name, date of birth, surgical site, and procedure verified   Procedure prep:  Patient was prepped and draped in usual sterile fashion Prep type:  Isopropyl alcohol and povidone-iodine Anesthesia: the lesion was anesthetized in a standard fashion   Anesthetic:  1% lidocaine w/ epinephrine 1-100,000 buffered w/ 8.4% NaHCO3 (3cc) Instrument used comment:  15c Hemostasis achieved with: pressure   Hemostasis achieved with comment:  Electrocautery Outcome: patient tolerated procedure well with no complications   Post-procedure details: sterile dressing applied and wound care instructions given   Dressing type: bandage and pressure dressing (mupirocin)    Skin repair Complexity:  Complex Final length (cm):  2.6 Reason for type of repair: reduce tension to allow closure, reduce the risk of dehiscence, infection, and necrosis, reduce subcutaneous dead space and avoid a hematoma, allow closure of the large defect, preserve normal anatomy, preserve normal anatomical and functional relationships  and enhance both functionality and cosmetic results   Undermining: area extensively undermined   Undermining comment:  Undermining defect 2 cm Subcutaneous layers (deep stitches):  Suture size:  5-0 Suture type: Vicryl (polyglactin 910)   Subcutaneous suture technique: inverted dermal. Fine/surface layer approximation (top stitches):  Suture size:  5-0 Suture type comment:  Nylon Stitches: simple running   Suture removal (days):  7 Hemostasis achieved with: suture and pressure Outcome: patient tolerated procedure well with no complications   Post-procedure details: sterile dressing applied and wound care instructions given   Dressing type: bandage and pressure dressing (mupirocin)   Additional details:  Undermining defect 2 cm  mupirocin ointment (BACTROBAN) 2 %  Specimen 1 - Surgical pathology Differential Diagnosis: Cyst vs other  Check Margins: Yes 1.2 cm Cystic papule  Return in about 1 week (around 08/18/2019).   I, Ashok Cordia, CMA, am acting as scribe for Sarina Ser, MD .  Documentation: I have reviewed the above documentation for accuracy and completeness, and I agree with the above.  Sarina Ser, MD

## 2019-08-11 NOTE — Telephone Encounter (Signed)
Spoke with patient regarding today's surgery. She is doing fine.

## 2019-08-11 NOTE — Patient Instructions (Signed)

## 2019-08-12 ENCOUNTER — Encounter: Payer: Self-pay | Admitting: Dermatology

## 2019-08-14 ENCOUNTER — Telehealth: Payer: Self-pay

## 2019-08-14 NOTE — Telephone Encounter (Signed)
Left message on voicemail to return my call (pathology results).

## 2019-08-18 ENCOUNTER — Other Ambulatory Visit: Payer: Self-pay

## 2019-08-18 ENCOUNTER — Ambulatory Visit (INDEPENDENT_AMBULATORY_CARE_PROVIDER_SITE_OTHER): Payer: Medicare HMO | Admitting: Dermatology

## 2019-08-18 ENCOUNTER — Encounter: Payer: Self-pay | Admitting: Dermatology

## 2019-08-18 DIAGNOSIS — L720 Epidermal cyst: Secondary | ICD-10-CM

## 2019-08-18 DIAGNOSIS — Z4802 Encounter for removal of sutures: Secondary | ICD-10-CM

## 2019-08-18 NOTE — Progress Notes (Signed)
   Follow-Up Visit   Subjective  Katie Bell is a 79 y.o. female who presents for the following: Cyst (of the anterior to right tragus - patient already scheduled for surgery ) and post op./suture removal (patient did experience pain at site the first few days, but it has since improved).  The following portions of the chart were reviewed this encounter and updated as appropriate:  Tobacco  Allergies  Meds  Problems  Med Hx  Surg Hx  Fam Hx     Review of Systems:  No other skin or systemic complaints except as noted in HPI or Assessment and Plan.  Objective  Well appearing patient in no apparent distress; mood and affect are within normal limits.  A focused examination was performed including the face. Relevant physical exam findings are noted in the Assessment and Plan.  Objective  Ant to R tragus, L mandible: 0.6 cm firm SQ nodule anterior to right tragus Healing excision site of the left mandible   Assessment & Plan  Epidermal inclusion cyst (2) L mandible; Ant to R tragus  Keep surgery appointment as scheduled for the anterior to right tragus cyst . Incision site is clean, dry and intact. Wound cleansed, sutures removed, wound cleansed and steri strips applied. Discussed pathology results of the left mandible.  Return for appointment as scheduled, surgery.  Luther Redo, CMA, am acting as scribe for Sarina Ser, MD .  Documentation: I have reviewed the above documentation for accuracy and completeness, and I agree with the above.  Sarina Ser, MD

## 2019-08-18 NOTE — Patient Instructions (Signed)
After Suture Removal ° °1. After sutures are removed, the wound should be coated with an antibiotic ointment (eg. Polysporin, Bacitracin) and, if possible, kept covered with a Band-Aid or bandage for an additional 24 hours.  After that, no additional wound care is generally needed.  °2. It is alright to get the area wet. °3. If a skin cancer was removed, your skin should be re-examined in approximately three months. ° ° ° °

## 2019-08-25 ENCOUNTER — Other Ambulatory Visit: Payer: Self-pay | Admitting: Internal Medicine

## 2019-08-25 NOTE — Telephone Encounter (Signed)
Last filled 07-28-19 #30 Last OV 08-06-19 Next OV 09-14-19 Katie Bell

## 2019-09-09 ENCOUNTER — Telehealth: Payer: Self-pay

## 2019-09-09 ENCOUNTER — Other Ambulatory Visit: Payer: Self-pay

## 2019-09-09 NOTE — Telephone Encounter (Signed)
I left a message on patient's voice mail to call back and rescheduled apointment.

## 2019-09-09 NOTE — Telephone Encounter (Signed)
[facility] Night - Client Nonclinical Telephone Record AccessNurse Client [facility] Night - Client Client Site [place] - Night Contact Type Call Who Is Calling Patient / Member / Family / Caregiver Caller Name Shokan Phone Number [phone] Patient Name Katie Bell Patient DOB 1940/06/24 Call Type Message Only Information Provided Reason for Call Request to Reschedule Office Appointment Initial Comment Caller states she needs change her appointment to next month Additional Comment Disp. Time Disposition Final User 09/08/2019 6:25:06 PM General Information Provided Yes [place], Anna Call Closed By: Caryl Bis Transaction Date/Time: 09/08/2019 6:23:34 PM (ET)

## 2019-09-14 ENCOUNTER — Ambulatory Visit (INDEPENDENT_AMBULATORY_CARE_PROVIDER_SITE_OTHER): Payer: Medicare Other | Admitting: Internal Medicine

## 2019-09-14 ENCOUNTER — Encounter: Payer: Self-pay | Admitting: Internal Medicine

## 2019-09-14 ENCOUNTER — Other Ambulatory Visit: Payer: Self-pay

## 2019-09-14 VITALS — BP 142/76 | HR 74 | Temp 99.7°F | Ht 60.0 in | Wt 112.8 lb

## 2019-09-14 DIAGNOSIS — E871 Hypo-osmolality and hyponatremia: Secondary | ICD-10-CM | POA: Diagnosis not present

## 2019-09-14 DIAGNOSIS — M8949 Other hypertrophic osteoarthropathy, multiple sites: Secondary | ICD-10-CM

## 2019-09-14 DIAGNOSIS — M159 Polyosteoarthritis, unspecified: Secondary | ICD-10-CM

## 2019-09-14 DIAGNOSIS — F39 Unspecified mood [affective] disorder: Secondary | ICD-10-CM | POA: Diagnosis not present

## 2019-09-14 DIAGNOSIS — Z7189 Other specified counseling: Secondary | ICD-10-CM

## 2019-09-14 DIAGNOSIS — Z0000 Encounter for general adult medical examination without abnormal findings: Secondary | ICD-10-CM | POA: Diagnosis not present

## 2019-09-14 DIAGNOSIS — I10 Essential (primary) hypertension: Secondary | ICD-10-CM

## 2019-09-14 LAB — COMPREHENSIVE METABOLIC PANEL
ALT: 9 U/L (ref 0–35)
AST: 13 U/L (ref 0–37)
Albumin: 4.5 g/dL (ref 3.5–5.2)
Alkaline Phosphatase: 47 U/L (ref 39–117)
BUN: 15 mg/dL (ref 6–23)
CO2: 27 mEq/L (ref 19–32)
Calcium: 9.8 mg/dL (ref 8.4–10.5)
Chloride: 106 mEq/L (ref 96–112)
Creatinine, Ser: 1.01 mg/dL (ref 0.40–1.20)
GFR: 52.73 mL/min — ABNORMAL LOW (ref >60.00)
Glucose, Bld: 95 mg/dL (ref 70–99)
Potassium: 3.3 mEq/L — ABNORMAL LOW (ref 3.5–5.1)
Sodium: 141 mEq/L (ref 135–145)
Total Bilirubin: 0.7 mg/dL (ref 0.2–1.2)
Total Protein: 7.5 g/dL (ref 6.0–8.3)

## 2019-09-14 LAB — CBC
HCT: 40.6 % (ref 36.0–46.0)
Hemoglobin: 13.8 g/dL (ref 12.0–15.0)
MCHC: 33.9 g/dL (ref 30.0–36.0)
MCV: 93.4 fl (ref 78.0–100.0)
Platelets: 235 10*3/uL (ref 150.0–400.0)
RBC: 4.35 Mil/uL (ref 3.87–5.11)
RDW: 12.1 % (ref 11.5–15.5)
WBC: 6.6 10*3/uL (ref 4.0–10.5)

## 2019-09-14 LAB — T4, FREE: Free T4: 0.86 ng/dL (ref 0.60–1.60)
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
## 2019-09-14 NOTE — Assessment & Plan Note (Signed)
I have personally reviewed the Medicare Annual Wellness questionnaire and have noted 1. The patient's medical and social history 2. Their use of alcohol, tobacco or illicit drugs 3. Their current medications and supplements 4. The patient's functional ability including ADL's, fall risks, home safety risks and hearing or visual             impairment. 5. Diet and physical activities 6. Evidence for depression or mood disorders  The patients weight, height, BMI and visual acuity have been recorded in the chart I have made referrals, counseling and provided education to the patient based review of the above and I have provided the pt with a written personalized care plan for preventive services.  I have provided you with a copy of your personalized plan for preventive services. Please take the time to review along with your updated medication list.  Had COVID vaccines Flu vaccine in the fall She is considering one last mammogram Regular exercise

## 2019-09-14 NOTE — Assessment & Plan Note (Signed)
Mild Tylenol prn only

## 2019-09-14 NOTE — Assessment & Plan Note (Signed)
BP Readings from Last 3 Encounters:  09/14/19 (!) 142/76  08/06/19 138/88  03/26/19 (!) 144/68   Reasonable control now on the amlodipine

## 2019-09-14 NOTE — Assessment & Plan Note (Signed)
See social history 

## 2019-09-14 NOTE — Assessment & Plan Note (Signed)
Severe in past--presumably from the losartan/HCTZ Will recheck labs

## 2019-09-15 ENCOUNTER — Encounter: Payer: PPO | Admitting: Dermatology

## 2019-10-07 ENCOUNTER — Telehealth: Payer: Self-pay | Admitting: Internal Medicine

## 2019-10-07 ENCOUNTER — Other Ambulatory Visit: Payer: Self-pay | Admitting: Internal Medicine

## 2019-10-07 MED ORDER — DIAZEPAM 10 MG PO TABS: 5.0000 mg | ORAL_TABLET | Freq: Two times a day (BID) | ORAL | 0 refills | Status: DC | PRN

## 2019-10-07 NOTE — Telephone Encounter (Signed)
Patient called about refill request  She stated that they pharmacy told her they sent something over to [place], but I did not see anything   She is needing the diazepam (VALIUM) 10 MG tablet  [place]

## 2019-10-07 NOTE — Telephone Encounter (Signed)
Last OV 09/14/19 No future visit scheduled Last fill 08/25/19, 1/2 tab BID, # 30

## 2019-10-20 ENCOUNTER — Encounter: Payer: Medicare HMO | Admitting: Dermatology

## 2019-10-30 ENCOUNTER — Other Ambulatory Visit: Payer: Self-pay | Admitting: Internal Medicine

## 2019-11-05 ENCOUNTER — Other Ambulatory Visit: Payer: Self-pay | Admitting: Internal Medicine

## 2019-11-05 NOTE — Telephone Encounter (Signed)
Last Filled 10-07-19 #30 Last OV 09-14-19 No Future [place]

## 2019-12-03 ENCOUNTER — Other Ambulatory Visit: Payer: Self-pay | Admitting: Internal Medicine

## 2019-12-03 NOTE — Telephone Encounter (Signed)
Last Filled 11-05-19 #30 Last OV 09-14-19 No Future [facility]

## 2019-12-14 ENCOUNTER — Emergency Department
Admission: EM | Admit: 2019-12-14 | Discharge: 2019-12-15 | Disposition: A | Discharge status: Home / Self Care | Payer: Medicare HMO | Attending: Emergency Medicine | Admitting: Emergency Medicine

## 2019-12-14 ENCOUNTER — Ambulatory Visit
Admission: EM | Admit: 2019-12-14 | Discharge: 2019-12-14 | Discharge status: Against Medical Advice | Payer: Medicare HMO | Attending: Emergency Medicine | Admitting: Emergency Medicine

## 2019-12-14 ENCOUNTER — Telehealth: Payer: Self-pay | Admitting: *Deleted

## 2019-12-14 ENCOUNTER — Encounter: Payer: Self-pay | Admitting: Emergency Medicine

## 2019-12-14 ENCOUNTER — Other Ambulatory Visit: Payer: Self-pay

## 2019-12-14 DIAGNOSIS — Y92002 Bathroom of unspecified non-institutional (private) residence single-family (private) house as the place of occurrence of the external cause: Secondary | ICD-10-CM | POA: Insufficient documentation

## 2019-12-14 DIAGNOSIS — Y9389 Activity, other specified: Secondary | ICD-10-CM | POA: Diagnosis not present

## 2019-12-14 DIAGNOSIS — R410 Disorientation, unspecified: Secondary | ICD-10-CM | POA: Diagnosis not present

## 2019-12-14 DIAGNOSIS — Y999 Unspecified external cause status: Secondary | ICD-10-CM | POA: Insufficient documentation

## 2019-12-14 DIAGNOSIS — W1839XA Other fall on same level, initial encounter: Secondary | ICD-10-CM | POA: Insufficient documentation

## 2019-12-14 DIAGNOSIS — Z5321 Procedure and treatment not carried out due to patient leaving prior to being seen by health care provider: Secondary | ICD-10-CM | POA: Diagnosis not present

## 2019-12-14 DIAGNOSIS — M549 Dorsalgia, unspecified: Secondary | ICD-10-CM | POA: Diagnosis not present

## 2019-12-14 DIAGNOSIS — R42 Dizziness and giddiness: Secondary | ICD-10-CM

## 2019-12-14 LAB — BASIC METABOLIC PANEL
Anion gap: 12 (ref 5–15)
BUN: 12 mg/dL (ref 8–23)
CO2: 24 mmol/L (ref 22–32)
Calcium: 9.6 mg/dL (ref 8.9–10.3)
Chloride: 108 mmol/L (ref 98–111)
Creatinine, Ser: 1.04 mg/dL — ABNORMAL HIGH (ref 0.44–1.00)
GFR calc Af Amer: 59 mL/min — ABNORMAL LOW (ref >60)
GFR calc non Af Amer: 51 mL/min — ABNORMAL LOW (ref >60)
Glucose, Bld: 103 mg/dL — ABNORMAL HIGH (ref 70–99)
Potassium: 2.8 mmol/L — ABNORMAL LOW (ref 3.5–5.1)
Sodium: 144 mmol/L (ref 135–145)

## 2019-12-14 LAB — POCT URINALYSIS DIP (MANUAL ENTRY)
Bilirubin, UA: NEGATIVE
Blood, UA: NEGATIVE
Glucose, UA: NEGATIVE mg/dL
Ketones, POC UA: NEGATIVE mg/dL
Nitrite, UA: NEGATIVE
Protein Ur, POC: NEGATIVE mg/dL
Spec Grav, UA: 1.01 (ref 1.010–1.025)
Urobilinogen, UA: 0.2 E.U./dL
pH, UA: 6 (ref 5.0–8.0)

## 2019-12-14 LAB — CBC
HCT: 41.4 % (ref 36.0–46.0)
Hemoglobin: 14.2 g/dL (ref 12.0–15.0)
MCH: 30.9 pg (ref 26.0–34.0)
MCHC: 34.3 g/dL (ref 30.0–36.0)
MCV: 90.2 fL (ref 80.0–100.0)
Platelets: 243 10*3/uL (ref 150–400)
RBC: 4.59 MIL/uL (ref 3.87–5.11)
RDW: 11.9 % (ref 11.5–15.5)
WBC: 9.4 10*3/uL (ref 4.0–10.5)
nRBC: 0 % (ref 0.0–0.2)

## 2019-12-14 NOTE — Telephone Encounter (Signed)
Noted, agree with advice given 

## 2019-12-14 NOTE — ED Triage Notes (Signed)
Pt states went to [facility] earlier today due to falling in the bathroom last night when she got up to go with the lights turned off, pt states due to the lights being turned off she became disoriented. Pt states the "heart machine" at [facility] was was broken and they couldn't do it and she had pain all over her back while she was laying flat waiting for them to do EKG. Per notes from [facility] unable to obtain EKG, so was referred to ED. Pt denies pain, denies dizziness at this time. UA obtained by [facility] and available in results.

## 2019-12-14 NOTE — Telephone Encounter (Signed)
Was seen in urgent care but then sent to the ER due to not being able to check an EKG. Will await the evaluation there

## 2019-12-14 NOTE — ED Notes (Signed)
Pt signed out AMA stating she could not wait any longer and she is hurting all over.

## 2019-12-14 NOTE — Discharge Instructions (Addendum)
Go to the Emergency Department for evaluation of your dizziness.

## 2019-12-14 NOTE — Telephone Encounter (Signed)
Patient called requesting an appointment and was transferred to triage because of symptoms. Patient complained of dizziness. Patient stated that she fell last night and was not able to get up. Patient stated that she started with some diarrheal last night and has a little this morning. Patient denies chest pain, SOB, or difficulty breathing. Patient stated that she has no way of checking her blood pressure. Patient stated that she did not hurt herself when she fell and her son helped her get up. Patient was advised unfortunately with her diarrhea we can not bring her into the office but she does need to be evaluated. Patient was advised that she should go to an Urgent Care and she was given information on the Urgent Care in [place]. Patient stated that she going to get her cousin to take her to the Urgent Care. Patient was given ER/911 precautions and she verbalized understanding.

## 2019-12-14 NOTE — ED Triage Notes (Signed)
Pt presents with dizziness and weakness x 1 day. Pt states room in spinning since yesterday and feeling weak. Denies chest pain, SOB, diarrhea.

## 2019-12-14 NOTE — ED Provider Notes (Signed)
Roderic Palau    CSN: [id number] Arrival date & time: 12/14/19  1103      History   Chief Complaint Chief Complaint  Patient presents with  . Dizziness  . Weakness    HPI Katie Bell is a 79 y.o. female.   Patient presents with dizziness, diarrhea x 2 episodes, and generalized weakness since yesterday evening.  She states her symptoms have improved this morning.  She states she got up yesterday evening to go to the bathroom and did not turn the lights on; She became disoriented in the dark and fell; She denies head injury or loss of consciousness; She denies injury.  She denies current dizziness, focal weakness, chest pain, shortness of breath, abdominal pain, or other symptoms.  She contacted her PCP this morning and was told to come here.  The history is provided by the patient.    Past Medical History:  Diagnosis Date  . Anxiety   . Cancer ([place]) 2015   basal cell removed from leg  . Depression   . Episodic mood disorder ([place])    mostly anxiety--depression in the past  . Gastric ulcer    History  . GERD (gastroesophageal reflux disease)   . History of blood transfusion    At [facility] appro 15 yrs ago  . History of hiatal hernia   . History of kidney stones 1960  . Hypertension   . Melanoma ([place]) 08/03/2014   left prox post lat thigh  . Osteoarthritis, hand    hand  . Postmenopausal disorder   . SVD (spontaneous vaginal delivery)    x 3    Patient Active Problem List   Diagnosis Date Noted  . Hyponatremia 01/06/2019  . Laceration of right leg excluding thigh, initial encounter 12/15/2018  . Visit for preventive health examination 02/16/2014  . Advanced directives, counseling/discussion 02/16/2014  . Female cystocele 11/02/2013  . Hypertension   . Gastric ulcer   . Episodic mood disorder ([place])   . Osteoarthritis, multiple sites     Past Surgical History:  Procedure Laterality Date  . CATARACT EXTRACTION W/PHACO Right  02/12/2019   Procedure: CATARACT EXTRACTION PHACO AND INTRAOCULAR LENS PLACEMENT ([facility]);  Surgeon: Marchia Meiers, MD;  Location: [facility] ORS;  Service: Ophthalmology;  Laterality: Right;  [place] 01:34.2 CDE 17.97 Fluid Pack lot # [id number]  . CATARACT EXTRACTION W/PHACO Left 03/26/2019   Procedure: CATARACT EXTRACTION PHACO AND INTRAOCULAR LENS PLACEMENT ([place]) LEFT VISION BLUE;  Surgeon: Marchia Meiers, MD;  Location: [facility] ORS;  Service: Ophthalmology;  Laterality: Left;  [place] 01:16.2 CDE 11.70 FLUID PACK LOT # [id number]  . CHOLECYSTECTOMY  2008  . COLONOSCOPY  04/2011  . CYSTOCELE REPAIR N/A 12/31/2013   Procedure: ANTERIOR REPAIR (CYSTOCELE);  Surgeon: Donnamae Jude, MD;  Location: [facility];  Service: Gynecology;  Laterality: N/A;  . CYSTOSCOPY W/ URETERAL STENT PLACEMENT Left 01/16/2016   Procedure: CYSTOSCOPY WITH STENT REPLACEMENT;  Surgeon: Hollice Espy, MD;  Location: [facility] ORS;  Service: Urology;  Laterality: Left;  . CYSTOSCOPY WITH STENT PLACEMENT Left 12/23/2015   Procedure: CYSTOSCOPY WITH STENT PLACEMENT;  Surgeon: Ardis Hughs, MD;  Location: [facility] ORS;  Service: Urology;  Laterality: Left;  . DIAGNOSTIC LAPAROSCOPY    . EYE SURGERY    . kidney stone removed    . MELANOMA EXCISION  2015   left posterior thigh  . TONSILLECTOMY  1944  . TUBAL LIGATION    . UPPER GI ENDOSCOPY    . URETEROSCOPY WITH  HOLMIUM LASER LITHOTRIPSY Left 01/16/2016   Procedure: URETEROSCOPY WITH HOLMIUM LASER LITHOTRIPSY;  Surgeon: Hollice Espy, MD;  Location: [facility] ORS;  Service: Urology;  Laterality: Left;  . WISDOM TOOTH EXTRACTION      OB History    Gravida  3   Para  3   Term      Preterm      AB      Living        SAB      TAB      Ectopic      Multiple      Live Births               Home Medications    Prior to Admission medications   Medication Sig Start Date End Date Taking? Authorizing Provider  amLODipine (NORVASC) 5 MG tablet TAKE 1 TABLET BY MOUTH ONCE DAILY 03/31/19    Venia Carbon, MD  Cholecalciferol (VITAMIN D) 400 UNITS capsule Take 800 Units by mouth daily.    [provider]  diazepam (VALIUM) 10 MG tablet TAKE 1/2 TO 1 TABLETS BY MOUTH 2 TIMES DAILY AS NEEDED FOR ANXIETY 12/03/19   Venia Carbon, MD  mupirocin ointment (BACTROBAN) 2 % Apply 1 application topically daily. Qd to excision site on left cheek 08/11/19   Ralene Bathe, MD  Omega-3 Fatty Acids (FISH OIL) 1000 MG CAPS Take 1,000 mg by mouth daily.     [provider]  potassium chloride (KLOR-CON) 10 MEQ tablet Take by mouth. 07/12/19 08/11/19  [provider]  potassium chloride SA (KLOR-CON M20) 20 MEQ tablet Take 1 tablet (20 mEq total) by mouth 2 (two) times daily. 03/09/19   Venia Carbon, MD  vitamin C (ASCORBIC ACID) 500 MG tablet Take 500 mg by mouth daily.    [provider]    Family History Family History  Problem Relation Age of Onset  . Alzheimer's disease Mother   . Alcohol abuse Father   . COPD Brother   . Heart disease Neg Hx   . Cancer Neg Hx     Social History Social History   Tobacco Use  . Smoking status: Former Research scientist (life sciences)  . Smokeless tobacco: Never Used  Substance Use Topics  . Alcohol use: No    Alcohol/week: 0.0 standard drinks  . Drug use: No     Allergies   Sulfamethoxazole-trimethoprim, Estradiol, Morphine, and Morphine and related   Review of Systems Review of Systems  Constitutional: Negative for chills and fever.  HENT: Negative for ear pain and sore throat.   Eyes: Negative for pain and visual disturbance.  Respiratory: Negative for cough and shortness of breath.   Cardiovascular: Negative for chest pain and palpitations.  Gastrointestinal: Positive for diarrhea. Negative for abdominal pain, nausea and vomiting.  Genitourinary: Negative for dysuria and hematuria.  Musculoskeletal: Negative for arthralgias and back pain.  Skin: Negative for color change and rash.  Neurological: Positive for  dizziness and weakness. Negative for seizures, syncope, facial asymmetry, speech difficulty, numbness and headaches.       Generalized weakness, no focal weakness.   All other systems reviewed and are negative.    Physical Exam Triage Vital Signs ED Triage Vitals [12/14/19 1106]  Enc Vitals Group     BP (!) 159/78     Pulse Rate 69     Resp 18     Temp 98.2 F (36.8 C)     Temp Source Oral  SpO2 96 %     Weight      Height      Head Circumference      Peak Flow      Pain Score      Pain Loc      Pain Edu?      Excl. in [place]?    No data found.  Updated Vital Signs BP (!) 159/78 (BP Location: Left Arm)   Pulse 69   Temp 98.2 F (36.8 C) (Oral)   Resp 18   SpO2 96%   Visual Acuity Right Eye Distance:   Left Eye Distance:   Bilateral Distance:    Right Eye Near:   Left Eye Near:    Bilateral Near:     Physical Exam Vitals and nursing note reviewed.  Constitutional:      General: She is not in acute distress.    Appearance: She is well-developed.     Comments: Frail elderly.  HENT:     Head: Normocephalic and atraumatic.     Mouth/Throat:     Mouth: Mucous membranes are moist.     Pharynx: Oropharynx is clear.  Eyes:     Conjunctiva/sclera: Conjunctivae normal.  Cardiovascular:     Rate and Rhythm: Normal rate and regular rhythm.     Heart sounds: No murmur heard.   Pulmonary:     Effort: Pulmonary effort is normal. No respiratory distress.     Breath sounds: Normal breath sounds. No wheezing, rhonchi or rales.  Abdominal:     Palpations: Abdomen is soft.     Tenderness: There is no abdominal tenderness. There is no guarding or rebound.  Musculoskeletal:     Cervical back: Neck supple.     Right lower leg: No edema.     Left lower leg: No edema.  Skin:    General: Skin is warm and dry.     Findings: No rash.  Neurological:     General: No focal deficit present.     Mental Status: She is alert. Mental status is at baseline.     Sensory: No  sensory deficit.     Motor: No weakness.  Psychiatric:        Mood and Affect: Mood normal.        Behavior: Behavior normal.      UC Treatments / Results  Labs (all labs ordered are listed, but only abnormal results are displayed) Labs Reviewed  POCT URINALYSIS DIP (MANUAL ENTRY) - Abnormal; Notable for the following components:      Result Value   Leukocytes, UA Small (1+) (*)    All other components within normal limits  NOVEL CORONAVIRUS, NAA  URINE CULTURE  CBC  COMPREHENSIVE METABOLIC PANEL    EKG   Radiology No results found.  Procedures Procedures (including critical care time)  Medications Ordered in UC Medications - No data to display  Initial Impression / Assessment and Plan / UC Course  I have reviewed the triage vital signs and the nursing notes.  Pertinent labs & imaging results that were available during my care of the patient were reviewed by me and considered in my medical decision making (see chart for details).   Dizziness.  Unable to obtain an EKG here due to machine failure.  Discussed options with patient and her family.  They opt to go to the emergency department for evaluation.  ED physician notified.  Patient and her family feel stable to transport POV.     Final  Clinical Impressions(s) / UC Diagnoses   Final diagnoses:  Dizziness     Discharge Instructions     Go to the Emergency Department for evaluation of your dizziness.     ED Prescriptions    None     PDMP not reviewed this encounter.   Sharion Balloon, NP 12/14/19 1242

## 2019-12-14 NOTE — ED Notes (Signed)
Pt called for repeat VS x 1.

## 2019-12-15 ENCOUNTER — Telehealth: Payer: Self-pay | Admitting: Internal Medicine

## 2019-12-15 DIAGNOSIS — Z20822 Contact with and (suspected) exposure to covid-19: Secondary | ICD-10-CM | POA: Diagnosis not present

## 2019-12-15 DIAGNOSIS — A419 Sepsis, unspecified organism: Secondary | ICD-10-CM | POA: Diagnosis not present

## 2019-12-15 DIAGNOSIS — I10 Essential (primary) hypertension: Secondary | ICD-10-CM | POA: Diagnosis not present

## 2019-12-15 DIAGNOSIS — K5730 Diverticulosis of large intestine without perforation or abscess without bleeding: Secondary | ICD-10-CM | POA: Diagnosis present

## 2019-12-15 DIAGNOSIS — K5750 Diverticulosis of both small and large intestine without perforation or abscess without bleeding: Secondary | ICD-10-CM | POA: Diagnosis not present

## 2019-12-15 DIAGNOSIS — K219 Gastro-esophageal reflux disease without esophagitis: Secondary | ICD-10-CM | POA: Diagnosis not present

## 2019-12-15 DIAGNOSIS — F419 Anxiety disorder, unspecified: Secondary | ICD-10-CM | POA: Diagnosis present

## 2019-12-15 DIAGNOSIS — Z825 Family history of asthma and other chronic lower respiratory diseases: Secondary | ICD-10-CM | POA: Diagnosis not present

## 2019-12-15 DIAGNOSIS — Z882 Allergy status to sulfonamides status: Secondary | ICD-10-CM | POA: Diagnosis not present

## 2019-12-15 DIAGNOSIS — Z811 Family history of alcohol abuse and dependence: Secondary | ICD-10-CM | POA: Diagnosis not present

## 2019-12-15 DIAGNOSIS — C439 Malignant melanoma of skin, unspecified: Secondary | ICD-10-CM | POA: Diagnosis not present

## 2019-12-15 DIAGNOSIS — Z888 Allergy status to other drugs, medicaments and biological substances status: Secondary | ICD-10-CM | POA: Diagnosis not present

## 2019-12-15 DIAGNOSIS — F329 Major depressive disorder, single episode, unspecified: Secondary | ICD-10-CM | POA: Diagnosis present

## 2019-12-15 DIAGNOSIS — R531 Weakness: Secondary | ICD-10-CM | POA: Diagnosis not present

## 2019-12-15 DIAGNOSIS — E860 Dehydration: Secondary | ICD-10-CM | POA: Diagnosis present

## 2019-12-15 DIAGNOSIS — Z820 Family history of epilepsy and other diseases of the nervous system: Secondary | ICD-10-CM | POA: Diagnosis not present

## 2019-12-15 DIAGNOSIS — M19049 Primary osteoarthritis, unspecified hand: Secondary | ICD-10-CM | POA: Diagnosis not present

## 2019-12-15 DIAGNOSIS — I700 Atherosclerosis of aorta: Secondary | ICD-10-CM | POA: Diagnosis not present

## 2019-12-15 DIAGNOSIS — E872 Acidosis: Secondary | ICD-10-CM | POA: Diagnosis not present

## 2019-12-15 DIAGNOSIS — R42 Dizziness and giddiness: Secondary | ICD-10-CM | POA: Diagnosis not present

## 2019-12-15 DIAGNOSIS — R1030 Lower abdominal pain, unspecified: Secondary | ICD-10-CM | POA: Diagnosis not present

## 2019-12-15 DIAGNOSIS — R000 Tachycardia, unspecified: Secondary | ICD-10-CM | POA: Diagnosis not present

## 2019-12-15 DIAGNOSIS — Z87891 Personal history of nicotine dependence: Secondary | ICD-10-CM | POA: Diagnosis not present

## 2019-12-15 DIAGNOSIS — I959 Hypotension, unspecified: Secondary | ICD-10-CM | POA: Diagnosis not present

## 2019-12-15 DIAGNOSIS — N390 Urinary tract infection, site not specified: Secondary | ICD-10-CM | POA: Diagnosis not present

## 2019-12-15 DIAGNOSIS — Z885 Allergy status to narcotic agent status: Secondary | ICD-10-CM | POA: Diagnosis not present

## 2019-12-15 DIAGNOSIS — E876 Hypokalemia: Secondary | ICD-10-CM | POA: Diagnosis not present

## 2019-12-15 DIAGNOSIS — R6520 Severe sepsis without septic shock: Secondary | ICD-10-CM | POA: Diagnosis not present

## 2019-12-15 DIAGNOSIS — K449 Diaphragmatic hernia without obstruction or gangrene: Secondary | ICD-10-CM | POA: Diagnosis not present

## 2019-12-15 DIAGNOSIS — N83209 Unspecified ovarian cyst, unspecified side: Secondary | ICD-10-CM | POA: Diagnosis present

## 2019-12-15 DIAGNOSIS — N838 Other noninflammatory disorders of ovary, fallopian tube and broad ligament: Secondary | ICD-10-CM | POA: Diagnosis not present

## 2019-12-15 NOTE — Telephone Encounter (Signed)
Sent to ER for chest pain after urgent care. Left after 12 hours without being seen. Reviewed her labs---potassium very low  Please see if she is taking any potassium--if not, she should restart KCl (send Rx for 1 year) at 24mEq daily Set up appt for later this week

## 2019-12-16 LAB — SARS-COV-2, NAA 2 DAY TAT

## 2019-12-16 LAB — NOVEL CORONAVIRUS, NAA: SARS-CoV-2, NAA: NOT DETECTED

## 2019-12-16 MED ORDER — POTASSIUM CHLORIDE CRYS ER 20 MEQ PO TBCR: 20.0000 meq | EXTENDED_RELEASE_TABLET | Freq: Every day | ORAL | 11 refills | Status: DC

## 2019-12-16 NOTE — Addendum Note (Signed)
Addended by: Randall An on: 12/16/2019 12:57 PM   Modules accepted: Orders

## 2019-12-16 NOTE — Telephone Encounter (Signed)
LVM

## 2019-12-16 NOTE — Telephone Encounter (Signed)
LVM for pt and tried son's number but son's number says it is not in service.

## 2019-12-18 ENCOUNTER — Inpatient Hospital Stay
Admission: EM | Admit: 2019-12-18 | Discharge: 2019-12-20 | DRG: 872 | Disposition: A | Discharge status: Home Health | Payer: Medicare HMO | Attending: Internal Medicine | Admitting: Internal Medicine

## 2019-12-18 ENCOUNTER — Encounter: Payer: Self-pay | Admitting: Emergency Medicine

## 2019-12-18 ENCOUNTER — Other Ambulatory Visit: Payer: Self-pay

## 2019-12-18 ENCOUNTER — Emergency Department: Payer: Medicare HMO

## 2019-12-18 DIAGNOSIS — N83209 Unspecified ovarian cyst, unspecified side: Secondary | ICD-10-CM | POA: Diagnosis present

## 2019-12-18 DIAGNOSIS — Z820 Family history of epilepsy and other diseases of the nervous system: Secondary | ICD-10-CM | POA: Diagnosis not present

## 2019-12-18 DIAGNOSIS — Z825 Family history of asthma and other chronic lower respiratory diseases: Secondary | ICD-10-CM | POA: Diagnosis not present

## 2019-12-18 DIAGNOSIS — R6520 Severe sepsis without septic shock: Secondary | ICD-10-CM | POA: Diagnosis not present

## 2019-12-18 DIAGNOSIS — R109 Unspecified abdominal pain: Secondary | ICD-10-CM | POA: Diagnosis present

## 2019-12-18 DIAGNOSIS — K449 Diaphragmatic hernia without obstruction or gangrene: Secondary | ICD-10-CM | POA: Diagnosis present

## 2019-12-18 DIAGNOSIS — M19049 Primary osteoarthritis, unspecified hand: Secondary | ICD-10-CM | POA: Diagnosis present

## 2019-12-18 DIAGNOSIS — Z882 Allergy status to sulfonamides status: Secondary | ICD-10-CM | POA: Diagnosis not present

## 2019-12-18 DIAGNOSIS — E876 Hypokalemia: Secondary | ICD-10-CM | POA: Diagnosis present

## 2019-12-18 DIAGNOSIS — F419 Anxiety disorder, unspecified: Secondary | ICD-10-CM | POA: Diagnosis present

## 2019-12-18 DIAGNOSIS — Z87891 Personal history of nicotine dependence: Secondary | ICD-10-CM

## 2019-12-18 DIAGNOSIS — C439 Malignant melanoma of skin, unspecified: Secondary | ICD-10-CM | POA: Diagnosis present

## 2019-12-18 DIAGNOSIS — R509 Fever, unspecified: Secondary | ICD-10-CM

## 2019-12-18 DIAGNOSIS — K5730 Diverticulosis of large intestine without perforation or abscess without bleeding: Secondary | ICD-10-CM | POA: Diagnosis present

## 2019-12-18 DIAGNOSIS — R531 Weakness: Secondary | ICD-10-CM | POA: Diagnosis not present

## 2019-12-18 DIAGNOSIS — F329 Major depressive disorder, single episode, unspecified: Secondary | ICD-10-CM | POA: Diagnosis present

## 2019-12-18 DIAGNOSIS — Z885 Allergy status to narcotic agent status: Secondary | ICD-10-CM | POA: Diagnosis not present

## 2019-12-18 DIAGNOSIS — K219 Gastro-esophageal reflux disease without esophagitis: Secondary | ICD-10-CM | POA: Diagnosis present

## 2019-12-18 DIAGNOSIS — A419 Sepsis, unspecified organism: Principal | ICD-10-CM | POA: Diagnosis present

## 2019-12-18 DIAGNOSIS — E872 Acidosis: Secondary | ICD-10-CM | POA: Diagnosis present

## 2019-12-18 DIAGNOSIS — R1030 Lower abdominal pain, unspecified: Secondary | ICD-10-CM

## 2019-12-18 DIAGNOSIS — Z888 Allergy status to other drugs, medicaments and biological substances status: Secondary | ICD-10-CM

## 2019-12-18 DIAGNOSIS — Z811 Family history of alcohol abuse and dependence: Secondary | ICD-10-CM

## 2019-12-18 DIAGNOSIS — E860 Dehydration: Secondary | ICD-10-CM | POA: Diagnosis present

## 2019-12-18 DIAGNOSIS — N390 Urinary tract infection, site not specified: Secondary | ICD-10-CM | POA: Diagnosis present

## 2019-12-18 DIAGNOSIS — I10 Essential (primary) hypertension: Secondary | ICD-10-CM | POA: Diagnosis present

## 2019-12-18 DIAGNOSIS — Z20822 Contact with and (suspected) exposure to covid-19: Secondary | ICD-10-CM | POA: Diagnosis present

## 2019-12-18 LAB — COMPREHENSIVE METABOLIC PANEL
ALT: 17 U/L (ref 0–44)
AST: 24 U/L (ref 15–41)
Albumin: 4.4 g/dL (ref 3.5–5.0)
Alkaline Phosphatase: 49 U/L (ref 38–126)
Anion gap: 13 (ref 5–15)
BUN: 11 mg/dL (ref 8–23)
CO2: 22 mmol/L (ref 22–32)
Calcium: 9.4 mg/dL (ref 8.9–10.3)
Chloride: 103 mmol/L (ref 98–111)
Creatinine, Ser: 0.99 mg/dL (ref 0.44–1.00)
GFR calc Af Amer: >60 mL/min (ref >60)
GFR calc non Af Amer: 54 mL/min — ABNORMAL LOW (ref >60)
Glucose, Bld: 133 mg/dL — ABNORMAL HIGH (ref 70–99)
Potassium: 3 mmol/L — ABNORMAL LOW (ref 3.5–5.1)
Sodium: 138 mmol/L (ref 135–145)
Total Bilirubin: 1.1 mg/dL (ref 0.3–1.2)
Total Protein: 7.9 g/dL (ref 6.5–8.1)

## 2019-12-18 LAB — CBC
HCT: 42.1 % (ref 36.0–46.0)
Hemoglobin: 14.4 g/dL (ref 12.0–15.0)
MCH: 31 pg (ref 26.0–34.0)
MCHC: 34.2 g/dL (ref 30.0–36.0)
MCV: 90.5 fL (ref 80.0–100.0)
Platelets: 212 10*3/uL (ref 150–400)
RBC: 4.65 MIL/uL (ref 3.87–5.11)
RDW: 11.9 % (ref 11.5–15.5)
WBC: 16.9 10*3/uL — ABNORMAL HIGH (ref 4.0–10.5)
nRBC: 0 % (ref 0.0–0.2)

## 2019-12-18 LAB — PROCALCITONIN: Procalcitonin: <0.1 ng/mL

## 2019-12-18 LAB — LIPASE, BLOOD: Lipase: 26 U/L (ref 11–51)

## 2019-12-18 LAB — SARS CORONAVIRUS 2 BY RT PCR (HOSPITAL ORDER, PERFORMED IN ~~LOC~~ HOSPITAL LAB): SARS Coronavirus 2: NEGATIVE

## 2019-12-18 LAB — LACTIC ACID, PLASMA: Lactic Acid, Venous: 2.4 mmol/L (ref 0.5–1.9)

## 2019-12-18 MED ORDER — VANCOMYCIN HCL IN DEXTROSE 1-5 GM/200ML-% IV SOLN: 1000.0000 mg | Freq: Once | INTRAVENOUS | Status: DC

## 2019-12-18 MED ORDER — VANCOMYCIN HCL IN DEXTROSE 1-5 GM/200ML-% IV SOLN
1000.0000 mg | Freq: Once | INTRAVENOUS | Status: DC
  Filled 2019-12-18: qty 200

## 2019-12-18 MED ORDER — ONDANSETRON HCL 4 MG PO TABS: 4.0000 mg | ORAL_TABLET | Freq: Four times a day (QID) | ORAL | Status: DC | PRN

## 2019-12-18 MED ORDER — ASCORBIC ACID 500 MG PO TABS
500.0000 mg | ORAL_TABLET | Freq: Every day | ORAL | Status: DC
  Administered 2019-12-19 – 2019-12-20 (×2): 500 mg via ORAL
  Filled 2019-12-18 (×2): qty 1

## 2019-12-18 MED ORDER — ACETAMINOPHEN 325 MG PO TABS
650.0000 mg | ORAL_TABLET | Freq: Four times a day (QID) | ORAL | Status: DC | PRN
  Administered 2019-12-19 (×2): 650 mg via ORAL
  Filled 2019-12-18 (×2): qty 2

## 2019-12-18 MED ORDER — POTASSIUM CHLORIDE CRYS ER 20 MEQ PO TBCR: 20.0000 meq | EXTENDED_RELEASE_TABLET | Freq: Every day | ORAL | Status: DC

## 2019-12-18 MED ORDER — SODIUM CHLORIDE 0.9 % IV SOLN: 2.0000 g | Freq: Two times a day (BID) | INTRAVENOUS | Status: DC

## 2019-12-18 MED ORDER — METRONIDAZOLE IN NACL 5-0.79 MG/ML-% IV SOLN
500.0000 mg | Freq: Once | INTRAVENOUS | Status: DC
  Filled 2019-12-18: qty 100

## 2019-12-18 MED ORDER — TRAZODONE HCL 50 MG PO TABS
25.0000 mg | ORAL_TABLET | Freq: Every evening | ORAL | Status: DC | PRN
  Administered 2019-12-19: 25 mg via ORAL
  Filled 2019-12-18: qty 1

## 2019-12-18 MED ORDER — VITAMIN D 25 MCG (1000 UNIT) PO TABS
1000.0000 [IU] | ORAL_TABLET | Freq: Every day | ORAL | Status: DC
  Administered 2019-12-19 – 2019-12-20 (×2): 1000 [IU] via ORAL
  Filled 2019-12-18 (×2): qty 1

## 2019-12-18 MED ORDER — SODIUM CHLORIDE 0.9 % IV SOLN
2.0000 g | Freq: Once | INTRAVENOUS | Status: AC
  Administered 2019-12-18: 2 g via INTRAVENOUS
  Filled 2019-12-18: qty 2

## 2019-12-18 MED ORDER — METRONIDAZOLE IN NACL 5-0.79 MG/ML-% IV SOLN
500.0000 mg | Freq: Three times a day (TID) | INTRAVENOUS | Status: DC
  Administered 2019-12-19: 500 mg via INTRAVENOUS
  Filled 2019-12-18: qty 100

## 2019-12-18 MED ORDER — ONDANSETRON HCL 4 MG/2ML IJ SOLN: 4.0000 mg | Freq: Four times a day (QID) | INTRAMUSCULAR | Status: DC | PRN

## 2019-12-18 MED ORDER — LACTATED RINGERS IV BOLUS (SEPSIS)
1000.0000 mL | Freq: Once | INTRAVENOUS | Status: AC
  Administered 2019-12-18: 1000 mL via INTRAVENOUS

## 2019-12-18 MED ORDER — POTASSIUM CHLORIDE CRYS ER 20 MEQ PO TBCR
40.0000 meq | EXTENDED_RELEASE_TABLET | Freq: Once | ORAL | Status: AC
  Administered 2019-12-19: 40 meq via ORAL
  Filled 2019-12-18: qty 2

## 2019-12-18 MED ORDER — MORPHINE SULFATE (PF) 2 MG/ML IV SOLN
2.0000 mg | Freq: Once | INTRAVENOUS | Status: AC
  Administered 2019-12-18: 2 mg via INTRAVENOUS
  Filled 2019-12-18: qty 1

## 2019-12-18 MED ORDER — ACETAMINOPHEN 650 MG RE SUPP: 650.0000 mg | Freq: Four times a day (QID) | RECTAL | Status: DC | PRN

## 2019-12-18 MED ORDER — POTASSIUM CHLORIDE 20 MEQ PO PACK: 40.0000 meq | PACK | Freq: Once | ORAL | Status: DC

## 2019-12-18 MED ORDER — IOHEXOL 300 MG/ML  SOLN
75.0000 mL | Freq: Once | INTRAMUSCULAR | Status: AC | PRN
  Administered 2019-12-18: 75 mL via INTRAVENOUS

## 2019-12-18 MED ORDER — AMLODIPINE BESYLATE 5 MG PO TABS
5.0000 mg | ORAL_TABLET | Freq: Every day | ORAL | Status: DC
  Administered 2019-12-19: 5 mg via ORAL
  Filled 2019-12-18: qty 1

## 2019-12-18 MED ORDER — SODIUM CHLORIDE 0.9 % IV SOLN: INTRAVENOUS | Status: DC

## 2019-12-18 MED ORDER — LACTATED RINGERS IV BOLUS (SEPSIS)
500.0000 mL | Freq: Once | INTRAVENOUS | Status: AC
  Administered 2019-12-18: 500 mL via INTRAVENOUS

## 2019-12-18 MED ORDER — OMEGA-3-ACID ETHYL ESTERS 1 G PO CAPS
1.0000 g | ORAL_CAPSULE | Freq: Every day | ORAL | Status: DC
  Administered 2019-12-19 – 2019-12-20 (×2): 1 g via ORAL
  Filled 2019-12-18 (×2): qty 1

## 2019-12-18 MED ORDER — MAGNESIUM HYDROXIDE 400 MG/5ML PO SUSP: 30.0000 mL | Freq: Every day | ORAL | Status: DC | PRN

## 2019-12-18 MED ORDER — LACTATED RINGERS IV BOLUS (SEPSIS)
250.0000 mL | Freq: Once | INTRAVENOUS | Status: AC
  Administered 2019-12-18: 250 mL via INTRAVENOUS

## 2019-12-18 MED ORDER — ENOXAPARIN SODIUM 40 MG/0.4ML ~~LOC~~ SOLN
40.0000 mg | SUBCUTANEOUS | Status: DC
  Administered 2019-12-19 (×2): 40 mg via SUBCUTANEOUS
  Filled 2019-12-18 (×2): qty 0.4

## 2019-12-18 NOTE — ED Notes (Signed)
Patient again up to desk asking about wait times, again explained to patient.

## 2019-12-18 NOTE — ED Notes (Addendum)
Pt up to restroom with assist to void without success.

## 2019-12-18 NOTE — Telephone Encounter (Addendum)
Veronia Beets a cousin calling to get appt for pt; she is not sure if pt feels like talking and that the pt is not feeling good at all. I called [name] pts son (DPR signed) and unable to reach by phone; the phone # for mark was given to me by Bethena Roys. I called the pt and left v/m for pt to cb. I spoke with Dr Silvio Pate and he said if she is feeling that bad due to her K being so low pt needs to go back to ED. [name] pts son called back and he said pt was not doing well that she had been in bed all day; [name] said that Bethena Roys was on her way back to pts home and mark would call her and [name] would make sure pt was taken to ED today. FYI to Dr Silvio Pate.

## 2019-12-18 NOTE — ED Notes (Signed)
Pt got up and urinated in floor. Pt assisted over to commode to attempt to void without success.

## 2019-12-18 NOTE — ED Notes (Signed)
Have spoken with pt several times  Encouraged her to sit down  Explained the wait

## 2019-12-18 NOTE — ED Notes (Signed)
  Pt transported to ct 

## 2019-12-18 NOTE — ED Triage Notes (Addendum)
See first RN Note. Pt states here because her BP was "real low". Pt states called PCP Dr. Silvio Pate and was referred to come to ED. Pt also c/o ongoing dizziness, HA, and abdominal pain at this time.

## 2019-12-18 NOTE — Progress Notes (Signed)
PHARMACY -  BRIEF ANTIBIOTIC NOTE   Pharmacy has received consult(s) for Vancomycin and Cefepime from an ED provider.  The patient's profile has been reviewed for ht/wt/allergies/indication/available labs.    One time order(s) placed for Vancomycin 1g and Cefepime 2g by ED provider.  Further antibiotics/pharmacy consults should be ordered by admitting physician if indicated.                       Thank you, Vira Blanco 12/18/2019  10:05 PM

## 2019-12-18 NOTE — Telephone Encounter (Signed)
I will wait to see how she makes out there There is some issue with the recurrent hypokalemia that needs to be figured out

## 2019-12-18 NOTE — Telephone Encounter (Signed)
Pt called back and said she is not going to sit in a chair at that hospital like she did yesterday. I explained to pt that she was at hospital on 12/14/19 and her K was very low and she needs to go to ED for eval per Dr Silvio Pate. After several times of explaining to pt that pt needed to go to ED and why pt said for me to talk with Bethena Roys who is at the house with her. Bethena Roys said to call [name] and talk with him.I spoke with [name] and he is in [place] and asked if I would call 911 for them to take pt to [facility] ED;Mark said that pt has gotten worse and that she has been in bed all day and has been confused this week. [name] said he would speak with Bethena Roys.  I called 911 and EMS was dispatched. I could not get pt or Bethena Roys to answer for so I let [name] know that EMS was on the way and if pt worsened prior to EMS arrival to call 911 back. [name] voiced understanding and was appreciative. FYI to Dr Silvio Pate.

## 2019-12-18 NOTE — Progress Notes (Signed)
CODE SEPSIS - PHARMACY COMMUNICATION  **Broad Spectrum Antibiotics should be administered within 1 hour of Sepsis diagnosis**  Time Code Sepsis Called/Page Received: 21:57  Antibiotics Ordered: Vancomcyin, Cefepime, Metronidazole  Time of 1st antibiotic administration: Cefepime given at 22:17  Additional action taken by pharmacy:    If necessary, Name of Provider/Nurse Contacted:      Vira Blanco ,PharmD Clinical Pharmacist  12/18/2019  10:30 PM

## 2019-12-18 NOTE — ED Notes (Signed)
Explained wait to patient who verbalized understanding.

## 2019-12-18 NOTE — ED Provider Notes (Signed)
[facility] Emergency Department Provider Note   ____________________________________________   First MD Initiated Contact with Patient 12/18/19 2144     (approximate)  I have reviewed the triage vital signs and the nursing notes.   HISTORY  Chief Complaint Weakness    HPI Katie Bell is a 79 y.o. female with past medical history of hypertension, GERD, and nephrolithiasis who presents to the ED complaining of weakness and abdominal pain.  Patient reports that she has been feeling increasingly weak and lightheaded over the past 5 days, has also been developing pain in the lower part of her abdomen on both sides.  She describes the pain as sharp and constant, not exacerbated or alleviated by anything.  She denies any associated dysuria, hematuria, nausea, vomiting, or changes in bowel movements.  She was not aware of any fevers at home and initial temperature here in the ED was normal, however on recheck it was noted to be 100.4.  Patient is fully vaccinated for COVID-19, does endorse a productive cough but denies chest pain or shortness of breath.  She has not had any recent sick contacts.  Her son at bedside states she has been more confused than usual over the past couple of days.        Past Medical History:  Diagnosis Date  . Anxiety   . Cancer ([place]) 2015   basal cell removed from leg  . Depression   . Episodic mood disorder ([place])    mostly anxiety--depression in the past  . Gastric ulcer    History  . GERD (gastroesophageal reflux disease)   . History of blood transfusion    At [facility] appro [facility] yrs ago  . History of hiatal hernia   . History of kidney stones 1960  . Hypertension   . Melanoma ([place]) 08/03/2014   left prox post lat thigh  . Osteoarthritis, hand    hand  . Postmenopausal disorder   . SVD (spontaneous vaginal delivery)    x 3    Patient Active Problem List   Diagnosis Date Noted  . Abdominal pain  12/18/2019  . Hyponatremia 01/06/2019  . Laceration of right leg excluding thigh, initial encounter 12/15/2018  . Visit for preventive health examination 02/16/2014  . Advanced directives, counseling/discussion 02/16/2014  . Female cystocele 11/02/2013  . Hypertension   . Gastric ulcer   . Episodic mood disorder ([place])   . Osteoarthritis, multiple sites     Past Surgical History:  Procedure Laterality Date  . CATARACT EXTRACTION W/PHACO Right 02/12/2019   Procedure: CATARACT EXTRACTION PHACO AND INTRAOCULAR LENS PLACEMENT (IOC);  Surgeon: Marchia Meiers, MD;  Location: [facility] ORS;  Service: Ophthalmology;  Laterality: Right;  [place] 01:34.2 CDE 17.97 Fluid Pack lot # [id number]  . CATARACT EXTRACTION W/PHACO Left 03/26/2019   Procedure: CATARACT EXTRACTION PHACO AND INTRAOCULAR LENS PLACEMENT ([place]) LEFT VISION BLUE;  Surgeon: Marchia Meiers, MD;  Location: [facility] ORS;  Service: Ophthalmology;  Laterality: Left;  [place] 01:16.2 CDE 11.70 FLUID PACK LOT # [id number]  . CHOLECYSTECTOMY  2008  . COLONOSCOPY  04/2011  . CYSTOCELE REPAIR N/A 12/31/2013   Procedure: ANTERIOR REPAIR (CYSTOCELE);  Surgeon: Donnamae Jude, MD;  Location: [place] ORS;  Service: Gynecology;  Laterality: N/A;  . CYSTOSCOPY W/ URETERAL STENT PLACEMENT Left 01/16/2016   Procedure: CYSTOSCOPY WITH STENT REPLACEMENT;  Surgeon: Hollice Espy, MD;  Location: [facility] ORS;  Service: Urology;  Laterality: Left;  . CYSTOSCOPY WITH STENT PLACEMENT Left 12/23/2015  Procedure: CYSTOSCOPY WITH STENT PLACEMENT;  Surgeon: Ardis Hughs, MD;  Location: [facility] ORS;  Service: Urology;  Laterality: Left;  . DIAGNOSTIC LAPAROSCOPY    . EYE SURGERY    . kidney stone removed    . MELANOMA EXCISION  2015   left posterior thigh  . TONSILLECTOMY  1944  . TUBAL LIGATION    . UPPER GI ENDOSCOPY    . URETEROSCOPY WITH HOLMIUM LASER LITHOTRIPSY Left 01/16/2016   Procedure: URETEROSCOPY WITH HOLMIUM LASER LITHOTRIPSY;  Surgeon: Hollice Espy, MD;  Location: [facility]  ORS;  Service: Urology;  Laterality: Left;  . WISDOM TOOTH EXTRACTION      Prior to Admission medications   Medication Sig Start Date End Date Taking? Authorizing Provider  amLODipine (NORVASC) 5 MG tablet TAKE 1 TABLET BY MOUTH ONCE DAILY 03/31/19   Venia Carbon, MD  Cholecalciferol (VITAMIN D) 400 UNITS capsule Take 800 Units by mouth daily.    [provider]  diazepam (VALIUM) 10 MG tablet TAKE 1/2 TO 1 TABLETS BY MOUTH 2 TIMES DAILY AS NEEDED FOR ANXIETY 12/03/19   Venia Carbon, MD  mupirocin ointment (BACTROBAN) 2 % Apply 1 application topically daily. Qd to excision site on left cheek 08/11/19   Ralene Bathe, MD  Omega-3 Fatty Acids (FISH OIL) 1000 MG CAPS Take 1,000 mg by mouth daily.     [provider]  potassium chloride SA (KLOR-CON) 20 MEQ tablet Take 1 tablet (20 mEq total) by mouth daily. 12/16/19   Venia Carbon, MD  vitamin C (ASCORBIC ACID) 500 MG tablet Take 500 mg by mouth daily.    [provider]    Allergies Sulfamethoxazole-trimethoprim, Estradiol, Morphine, and Morphine and related  Family History  Problem Relation Age of Onset  . Alzheimer's disease Mother   . Alcohol abuse Father   . COPD Brother   . Heart disease Neg Hx   . Cancer Neg Hx     Social History Social History   Tobacco Use  . Smoking status: Former Research scientist (life sciences)  . Smokeless tobacco: Never Used  Substance Use Topics  . Alcohol use: No    Alcohol/week: 0.0 standard drinks  . Drug use: No    Review of Systems  Constitutional: No fever/chills.  Positive for lightheadedness and generalized weakness. Eyes: No visual changes. ENT: No sore throat. Cardiovascular: Denies chest pain. Respiratory: Denies shortness of breath. Gastrointestinal: Positive for abdominal pain.  No nausea, no vomiting.  No diarrhea.  No constipation. Genitourinary: Negative for dysuria. Musculoskeletal: Negative for back pain. Skin: Negative for rash. Neurological: Negative  for headaches, focal weakness or numbness.  ____________________________________________   PHYSICAL EXAM:  VITAL SIGNS: ED Triage Vitals  Enc Vitals Group     BP 12/18/19 1511 (!) 140/54     Pulse Rate 12/18/19 1511 100     Resp 12/18/19 1511 20     Temp 12/18/19 1511 99.4 F (37.4 C)     Temp Source 12/18/19 1511 Oral     SpO2 12/18/19 1511 96 %     Weight 12/18/19 1512 112 lb 14 oz (51.2 kg)     Height 12/18/19 1512 5' (1.524 m)     Head Circumference --      Peak Flow --      Pain Score 12/18/19 1511 8     Pain Loc --      Pain Edu? --      Excl. in [place]? --     Constitutional: Alert and  oriented to person and place, but not time. Eyes: Conjunctivae are normal. Head: Atraumatic. Nose: No congestion/rhinnorhea. Mouth/Throat: Mucous membranes are moist. Neck: Normal ROM Cardiovascular: Tachycardic, regular rhythm. Grossly normal heart sounds. Respiratory: Normal respiratory effort.  No retractions. Lungs CTAB. Gastrointestinal: Soft and tender to palpation in bilateral lower quadrants. No distention. Genitourinary: deferred Musculoskeletal: No lower extremity tenderness nor edema. Neurologic:  Normal speech and language. No gross focal neurologic deficits are appreciated. Skin:  Skin is warm, dry and intact. No rash noted. Psychiatric: Mood and affect are normal. Speech and behavior are normal.  ____________________________________________   LABS (all labs ordered are listed, but only abnormal results are displayed)  Labs Reviewed  COMPREHENSIVE METABOLIC PANEL - Abnormal; Notable for the following components:      Result Value   Potassium 3.0 (*)    Glucose, Bld 133 (*)    GFR calc non Af Amer 54 (*)    All other components within normal limits  CBC - Abnormal; Notable for the following components:   WBC 16.9 (*)    All other components within normal limits  LACTIC ACID, PLASMA - Abnormal; Notable for the following components:   Lactic Acid, Venous 2.4 (*)      All other components within normal limits  SARS CORONAVIRUS 2 BY RT PCR (HOSPITAL ORDER, [place] [facility])  CULTURE, BLOOD (ROUTINE X 2)  CULTURE, BLOOD (ROUTINE X 2)  LIPASE, BLOOD  PROCALCITONIN  URINALYSIS, COMPLETE (UACMP) WITH MICROSCOPIC  LACTIC ACID, PLASMA  PROCALCITONIN   ____________________________________________  EKG  ED ECG REPORT I, Blake Divine, the attending physician, personally viewed and interpreted this ECG.   Date: 12/18/2019  EKG Time: 15:11  Rate: 101  Rhythm: sinus tachycardia  Axis: LAD  Intervals:none  ST&T Change: None   PROCEDURES  Procedure(s) performed (including Critical Care):  .Critical Care Performed by: Blake Divine, MD Authorized by: Blake Divine, MD   Critical care provider statement:    Critical care time (minutes):  45   Critical care time was exclusive of:  Separately billable procedures and treating other patients and teaching time   Critical care was necessary to treat or prevent imminent or life-threatening deterioration of the following conditions:  Sepsis   Critical care was time spent personally by me on the following activities:  Discussions with consultants, evaluation of patient's response to treatment, examination of patient, ordering and performing treatments and interventions, ordering and review of laboratory studies, ordering and review of radiographic studies, pulse oximetry, re-evaluation of patient's condition, obtaining history from patient or surrogate and review of old charts   I assumed direction of critical care for this patient from another provider in my specialty: no       ____________________________________________   INITIAL IMPRESSION / ASSESSMENT AND PLAN / ED COURSE       79 year old female with past medical history of hypertension, GERD, nephrolithiasis who presents to the ED complaining of increasing weakness and fatigue over the past 5 days, more recently  developing bilateral lower quadrant abdominal pain.  Initial vital signs were reassuring, however on recheck in the waiting room patient noted to be increasingly tachycardic with a temp of 100.4.  Initial labs also show leukocytosis and presentation is concerning for sepsis.  She is somewhat disoriented but has no focal deficits on exam.  Most likely source of sepsis at this time appears to be either UTI or other intra-abdominal process.  We will obtain UA and CT scan, outside of leukocytosis and  mild hypokalemia, labs are thus far reassuring.  We will hydrate with IV fluids and treat with broad-spectrum antibiotics given unclear source at this time.  CT scan is unremarkable and chest x-ray shows no evidence of infection.  Unable to obtain UA at this time as patient unfortunately urinated on the floor.  UTI does appear to be the most likely source of her sepsis given her lower abdominal tenderness with unremarkable CT scan.  She has been covered with broad-spectrum antibiotics and remains hemodynamically stable following IV fluids.  COVID-19 testing is negative.  Case discussed with hospitalist for admission.      ____________________________________________   FINAL CLINICAL IMPRESSION(S) / ED DIAGNOSES  Final diagnoses:  Sepsis without acute organ dysfunction, due to unspecified organism [facility])  Generalized weakness  Lower abdominal pain     ED Discharge Orders    None       Note:  This document was prepared using Dragon voice recognition software and may include unintentional dictation errors.   Blake Divine, MD 12/18/19 2337

## 2019-12-18 NOTE — ED Triage Notes (Signed)
vss brought by ems.  Had left because of long wait.

## 2019-12-19 DIAGNOSIS — I959 Hypotension, unspecified: Secondary | ICD-10-CM

## 2019-12-19 DIAGNOSIS — R6520 Severe sepsis without septic shock: Secondary | ICD-10-CM

## 2019-12-19 DIAGNOSIS — N390 Urinary tract infection, site not specified: Secondary | ICD-10-CM

## 2019-12-19 DIAGNOSIS — R1031 Right lower quadrant pain: Secondary | ICD-10-CM

## 2019-12-19 DIAGNOSIS — E876 Hypokalemia: Secondary | ICD-10-CM

## 2019-12-19 DIAGNOSIS — A419 Sepsis, unspecified organism: Principal | ICD-10-CM

## 2019-12-19 DIAGNOSIS — R5381 Other malaise: Secondary | ICD-10-CM

## 2019-12-19 DIAGNOSIS — D72829 Elevated white blood cell count, unspecified: Secondary | ICD-10-CM

## 2019-12-19 DIAGNOSIS — T83511A Infection and inflammatory reaction due to indwelling urethral catheter, initial encounter: Secondary | ICD-10-CM

## 2019-12-19 LAB — PROCALCITONIN: Procalcitonin: <0.1 ng/mL

## 2019-12-19 LAB — MAGNESIUM
Magnesium: 1.6 mg/dL — ABNORMAL LOW (ref 1.7–2.4)
Magnesium: 2.9 mg/dL — ABNORMAL HIGH (ref 1.7–2.4)

## 2019-12-19 LAB — BASIC METABOLIC PANEL
Anion gap: 7 (ref 5–15)
BUN: 12 mg/dL (ref 8–23)
CO2: 23 mmol/L (ref 22–32)
Calcium: 8.3 mg/dL — ABNORMAL LOW (ref 8.9–10.3)
Chloride: 108 mmol/L (ref 98–111)
Creatinine, Ser: 0.91 mg/dL (ref 0.44–1.00)
GFR calc Af Amer: >60 mL/min (ref >60)
GFR calc non Af Amer: 60 mL/min — ABNORMAL LOW (ref >60)
Glucose, Bld: 100 mg/dL — ABNORMAL HIGH (ref 70–99)
Potassium: 3 mmol/L — ABNORMAL LOW (ref 3.5–5.1)
Sodium: 138 mmol/L (ref 135–145)

## 2019-12-19 LAB — URINALYSIS, COMPLETE (UACMP) WITH MICROSCOPIC
Bilirubin Urine: NEGATIVE
Glucose, UA: NEGATIVE mg/dL
Ketones, ur: NEGATIVE mg/dL
Nitrite: POSITIVE — AB
Protein, ur: NEGATIVE mg/dL
Specific Gravity, Urine: 1.034 — ABNORMAL HIGH (ref 1.005–1.030)
pH: 6 (ref 5.0–8.0)

## 2019-12-19 LAB — PROTIME-INR
INR: 1.2 (ref 0.8–1.2)
Prothrombin Time: 14.6 seconds (ref 11.4–15.2)

## 2019-12-19 LAB — CBC
HCT: 35.5 % — ABNORMAL LOW (ref 36.0–46.0)
Hemoglobin: 11.9 g/dL — ABNORMAL LOW (ref 12.0–15.0)
MCH: 31.1 pg (ref 26.0–34.0)
MCHC: 33.5 g/dL (ref 30.0–36.0)
MCV: 92.7 fL (ref 80.0–100.0)
Platelets: 168 10*3/uL (ref 150–400)
RBC: 3.83 MIL/uL — ABNORMAL LOW (ref 3.87–5.11)
RDW: 12 % (ref 11.5–15.5)
WBC: 12.5 10*3/uL — ABNORMAL HIGH (ref 4.0–10.5)
nRBC: 0 % (ref 0.0–0.2)

## 2019-12-19 LAB — LACTIC ACID, PLASMA: Lactic Acid, Venous: 1.8 mmol/L (ref 0.5–1.9)

## 2019-12-19 MED ORDER — SODIUM CHLORIDE 0.9 % IV BOLUS
250.0000 mL | Freq: Once | INTRAVENOUS | Status: AC
  Administered 2019-12-19: 250 mL via INTRAVENOUS

## 2019-12-19 MED ORDER — SODIUM CHLORIDE 0.9% FLUSH: 10.0000 mL | INTRAVENOUS | Status: DC | PRN

## 2019-12-19 MED ORDER — SODIUM CHLORIDE 0.9 % IV SOLN
1.0000 g | INTRAVENOUS | Status: DC
  Administered 2019-12-19 – 2019-12-20 (×2): 1 g via INTRAVENOUS
  Filled 2019-12-19: qty 10
  Filled 2019-12-19: qty 1

## 2019-12-19 MED ORDER — POTASSIUM CHLORIDE CRYS ER 20 MEQ PO TBCR
40.0000 meq | EXTENDED_RELEASE_TABLET | ORAL | Status: AC
  Administered 2019-12-19 (×2): 40 meq via ORAL
  Filled 2019-12-19 (×2): qty 2

## 2019-12-19 MED ORDER — VANCOMYCIN HCL 1250 MG/250ML IV SOLN
1250.0000 mg | Freq: Once | INTRAVENOUS | Status: AC
  Administered 2019-12-19: 1250 mg via INTRAVENOUS
  Filled 2019-12-19: qty 250

## 2019-12-19 MED ORDER — VANCOMYCIN HCL 750 MG/150ML IV SOLN: 750.0000 mg | INTRAVENOUS | Status: DC

## 2019-12-19 MED ORDER — MAGNESIUM SULFATE 2 GM/50ML IV SOLN
2.0000 g | Freq: Once | INTRAVENOUS | Status: AC
  Administered 2019-12-19: 2 g via INTRAVENOUS
  Filled 2019-12-19: qty 50

## 2019-12-19 MED ORDER — SODIUM CHLORIDE 0.9% FLUSH
10.0000 mL | Freq: Two times a day (BID) | INTRAVENOUS | Status: DC
  Administered 2019-12-19 – 2019-12-20 (×3): 10 mL

## 2019-12-19 MED ORDER — DIAZEPAM 2 MG PO TABS: 2.0000 mg | ORAL_TABLET | Freq: Two times a day (BID) | ORAL | Status: DC | PRN

## 2019-12-19 NOTE — Progress Notes (Signed)
PROGRESS NOTE  Katie Bell SEG:[id number] DOB: 02-12-1941   PCP: Venia Carbon, MD  Patient is from: Home.  DOA: 12/18/2019 LOS: 1  Brief Narrative / Interim history: 79 year old female with history of HTN, anxiety, depression and GERD presenting with generalized weakness, dizziness, RLQ pain, increased urinary frequency and urgency, and low-grade fever "few days", admitted for sepsis due to urinary tract infection.  In ED, febrile to 100.4.  Tachycardic to 118.  BP 140/54> 89/35> 118/56.  WBC 17.  K3.0.  Glucose 133.  Otherwise CBC and CMP without significant finding.  Lactic acid 2.4> 1.8 after IV fluid.  Pro-Cal negative.  UA concerning for UTI.  CXR and CT abdomen and pelvis without acute finding.  Cultures obtained.  Resuscitated with IV fluid.  Started on IV cefepime, vancomycin and Flagyl.  Admitted for sepsis likely due to urinary tract infection.   Subjective: No major events overnight of this morning.  She is a sleepy but wakes to voice.  Responds to question appropriately.  Oriented to self, place and the date.  Reports feeling better.  Abdominal pain improved.  Denies chest pain, dyspnea, GI or UTI symptoms this morning.  Objective: Vitals:   12/19/19 0800 12/19/19 0900 12/19/19 1151 12/19/19 1209  BP:  (!) 111/49 (!) 118/56   Pulse: 63 62 81   Resp: 15  14   Temp:    97.6 F (36.4 C)  TempSrc:    Oral  SpO2: 96% 98% 98%   Weight:      Height:        Intake/Output Summary (Last 24 hours) at 12/19/2019 1217 Last data filed at 12/19/2019 7371 Gross per 24 hour  Intake 6773.49 ml  Output 400 ml  Net 6373.49 ml   Filed Weights   12/18/19 1512  Weight: 51.2 kg    Examination:  GENERAL: No apparent distress.  Nontoxic. HEENT: Somewhat dry oral mucosa NECK: Supple.  No apparent JVD.  No LAD. RESP:  No IWOB.  Fair aeration bilaterally. CVS:  RRR. Heart sounds normal.  ABD/GI/GU: BS+. Abd soft.   Mild discomfort over RLQ. MSK/EXT:  Moves extremities. No  apparent deformity. No edema.  SKIN: no apparent skin lesion or wound NEURO: Sleepy but wakes to voice.  Oriented to self, place and day.  No apparent focal neuro deficit. PSYCH: Calm. Normal affect.  Procedures:  None  Microbiology summarized: COVID-19 PCR negative. Blood cultures pending. Urine cultures pending.  Assessment & Plan: Severe sepsis due to UTI: had fever, tachycardia and leukocytosis.  Also evidence of endorgan damage as evidenced by lactic acidosis and hypotension.  Received vancomycin, cefepime and Flagyl in ED. Sepsis physiology improving. -Continue IV ceftriaxone pending cultures and sensitivities. -Continue gentle IV fluid -Monitor leukocytosis  Hypokalemia/hypomagnesemia: K3.0. -Replenish and recheck  Hypotension in patient with history of essential hypertension: Likely due to sepsis as above.  Resolving. -Continue gentle IV fluid  Right lower quadrant abdominal pain: Likely due to UTI.  Pain improved.  No rebound or guarding.  CT abdomen and pelvis without acute finding. -Treat UTI as above  Anxiety: On Valium 5 to 10 mg twice daily at home. -Continue home Valium at reduced dose  Dehydration: Still with dry oral mucosa despite IV fluid hydration. -Continue IV fluid  Debility/physical deconditioning in acute -PT/OT eval   Body mass index is 22.04 kg/m.         DVT prophylaxis:  enoxaparin (LOVENOX) injection 40 mg Start: 12/19/19 0000  Code Status: Full code Family Communication:  Updated patient's son over the phone Status is: Inpatient  Remains inpatient appropriate because:Hemodynamically unstable, IV treatments appropriate due to intensity of illness or inability to take PO and Inpatient level of care appropriate due to severity of illness  On IVF and CTX.  Dispo: The patient is from: Home              Anticipated d/c is to: Home              Anticipated d/c date is: 2 days              Patient currently is not medically stable to  d/c.       Consultants:  None   Sch Meds:  Scheduled Meds: . vitamin C  500 mg Oral Daily  . cholecalciferol  1,000 Units Oral Daily  . enoxaparin (LOVENOX) injection  40 mg Subcutaneous Q24H  . omega-3 acid ethyl esters  1 g Oral Daily  . sodium chloride flush  10-40 mL Intracatheter Q12H   Continuous Infusions: . sodium chloride 100 mL/hr at 12/19/19 1052  . cefTRIAXone (ROCEPHIN)  IV Stopped (12/19/19 0739)   PRN Meds:.acetaminophen **OR** acetaminophen, diazepam, magnesium hydroxide, ondansetron **OR** ondansetron (ZOFRAN) IV, sodium chloride flush, traZODone  Antimicrobials: Anti-infectives (From admission, onward)   Start     Dose/Rate Route Frequency Ordered Stop   12/19/19 2200  vancomycin (VANCOREADY) IVPB 750 mg/150 mL  Status:  Discontinued        750 mg 150 mL/hr over 60 Minutes Intravenous Every 24 hours 12/19/19 0007 12/19/19 0226   12/19/19 1000  ceFEPIme (MAXIPIME) 2 g in sodium chloride 0.9 % 100 mL IVPB  Status:  Discontinued        2 g 200 mL/hr over 30 Minutes Intravenous Every 12 hours 12/18/19 2357 12/19/19 0226   12/19/19 0600  cefTRIAXone (ROCEPHIN) 1 g in sodium chloride 0.9 % 100 mL IVPB        1 g 200 mL/hr over 30 Minutes Intravenous Every 24 hours 12/19/19 0225     12/19/19 0015  vancomycin (VANCOREADY) IVPB 1250 mg/250 mL        1,250 mg 166.7 mL/hr over 90 Minutes Intravenous  Once 12/19/19 0001 12/19/19 0638   12/19/19 0000  metroNIDAZOLE (FLAGYL) IVPB 500 mg  Status:  Discontinued        500 mg 100 mL/hr over 60 Minutes Intravenous Every 8 hours 12/18/19 2357 12/19/19 0226   12/19/19 0000  vancomycin (VANCOCIN) IVPB 1000 mg/200 mL premix  Status:  Discontinued        1,000 mg 200 mL/hr over 60 Minutes Intravenous  Once 12/18/19 2357 12/19/19 0001   12/18/19 2200  ceFEPIme (MAXIPIME) 2 g in sodium chloride 0.9 % 100 mL IVPB        2 g 200 mL/hr over 30 Minutes Intravenous  Once 12/18/19 2157 12/19/19 0027   12/18/19 2200  metroNIDAZOLE  (FLAGYL) IVPB 500 mg  Status:  Discontinued        500 mg 100 mL/hr over 60 Minutes Intravenous  Once 12/18/19 2157 12/19/19 0002   12/18/19 2200  vancomycin (VANCOCIN) IVPB 1000 mg/200 mL premix  Status:  Discontinued        1,000 mg 200 mL/hr over 60 Minutes Intravenous  Once 12/18/19 2157 12/19/19 0001       I have personally reviewed the following labs and images: CBC: Recent Labs  Lab 12/14/19 1347 12/18/19 1514 12/19/19 0551  WBC 9.4 16.9* 12.5*  HGB 14.2 14.4  11.9*  HCT 41.4 42.1 35.5*  MCV 90.2 90.5 92.7  PLT 243 212 168   BMP &GFR Recent Labs  Lab 12/14/19 1347 12/18/19 1514 12/19/19 0008 12/19/19 0551  NA 144 138  --  138  K 2.8* 3.0*  --  3.0*  CL 108 103  --  108  CO2 24 22  --  23  GLUCOSE 103* 133*  --  100*  BUN 12 11  --  12  CREATININE 1.04* 0.99  --  0.91  CALCIUM 9.6 9.4  --  8.3*  MG  --   --  1.6*  --    Estimated Creatinine Clearance: 35.4 mL/min (by C-G formula based on SCr of 0.91 mg/dL). Liver & Pancreas: Recent Labs  Lab 12/18/19 1514  AST 24  ALT 17  ALKPHOS 49  BILITOT 1.1  PROT 7.9  ALBUMIN 4.4   Recent Labs  Lab 12/18/19 1514  LIPASE 26   No results for input(s): AMMONIA in the last 168 hours. Diabetic: No results for input(s): HGBA1C in the last 72 hours. No results for input(s): GLUCAP in the last 168 hours. Cardiac Enzymes: No results for input(s): CKTOTAL, CKMB, CKMBINDEX, TROPONINI in the last 168 hours. No results for input(s): PROBNP in the last 8760 hours. Coagulation Profile: Recent Labs  Lab 12/19/19 0551  INR 1.2   Thyroid Function Tests: No results for input(s): TSH, T4TOTAL, FREET4, T3FREE, THYROIDAB in the last 72 hours. Lipid Profile: No results for input(s): CHOL, HDL, LDLCALC, TRIG, CHOLHDL, LDLDIRECT in the last 72 hours. Anemia Panel: No results for input(s): VITAMINB12, FOLATE, FERRITIN, TIBC, IRON, RETICCTPCT in the last 72 hours. Urine analysis:    Component Value Date/Time   COLORURINE  YELLOW (A) 12/19/2019 0111   APPEARANCEUR HAZY (A) 12/19/2019 0111   LABSPEC 1.034 (H) 12/19/2019 0111   PHURINE 6.0 12/19/2019 0111   GLUCOSEU NEGATIVE 12/19/2019 0111   HGBUR SMALL (A) 12/19/2019 0111   BILIRUBINUR NEGATIVE 12/19/2019 0111   BILIRUBINUR negative 12/14/2019 1159   BILIRUBINUR negative 11/18/2018 1023   KETONESUR NEGATIVE 12/19/2019 0111   PROTEINUR NEGATIVE 12/19/2019 0111   UROBILINOGEN 0.2 12/14/2019 1159   NITRITE POSITIVE (A) 12/19/2019 0111   LEUKOCYTESUR SMALL (A) 12/19/2019 0111   Sepsis Labs: Invalid input(s): PROCALCITONIN, [place]  Microbiology: Recent Results (from the past 240 hour(s))  Novel Coronavirus, NAA ([facility])     Status: None   Collection Time: 12/14/19 11:38 AM   Specimen: Nasal Swab; Nasopharyngeal(NP) swabs in vial transport medium   Nasopharynge  Result Value Ref Range Status   SARS-CoV-2, NAA Not Detected Not Detected Final    Comment: This nucleic acid amplification test was developed and its performance characteristics determined by [employer]. Nucleic acid amplification tests include RT-PCR and TMA. This test has not been FDA cleared or approved. This test has been authorized by FDA under an Emergency Use Authorization (EUA). This test is only authorized for the duration of time the declaration that circumstances exist justifying the authorization of the emergency use of in vitro diagnostic tests for detection of SARS-CoV-2 virus and/or diagnosis of COVID-19 infection under section 564(b)(1) of the Act, 21 U.S.C. 093GHW-2(X) (1), unless the authorization is terminated or revoked sooner. When diagnostic testing is negative, the possibility of a false negative result should be considered in the context of a patient's recent exposures and the presence of clinical signs and symptoms consistent with COVID-19. An individual without symptoms of COVID-19 and who is not shedding SARS-CoV-2 virus wo uld  expect to have  a negative (not detected) result in this assay.   SARS-COV-2, NAA 2 DAY TAT     Status: None   Collection Time: 12/14/19 11:38 AM   Nasopharynge  Result Value Ref Range Status   SARS-CoV-2, NAA 2 DAY TAT Performed  Final  SARS Coronavirus 2 by RT PCR (hospital order, performed in [facility] hospital lab) Nasopharyngeal Nasopharyngeal Swab     Status: None   Collection Time: 12/18/19 10:05 PM   Specimen: Nasopharyngeal Swab  Result Value Ref Range Status   SARS Coronavirus 2 NEGATIVE NEGATIVE Final    Comment: (NOTE) SARS-CoV-2 target nucleic acids are NOT DETECTED.  The SARS-CoV-2 RNA is generally detectable in upper and lower respiratory specimens during the acute phase of infection. The lowest concentration of SARS-CoV-2 viral copies this assay can detect is 250 copies / mL. A negative result does not preclude SARS-CoV-2 infection and should not be used as the sole basis for treatment or other patient management decisions.  A negative result may occur with improper specimen collection / handling, submission of specimen other than nasopharyngeal swab, presence of viral mutation(s) within the areas targeted by this assay, and inadequate number of viral copies (<250 copies / mL). A negative result must be combined with clinical observations, patient history, and epidemiological information.  Fact Sheet for Patients:   StrictlyIdeas.no  Fact Sheet for Healthcare Providers: BankingDealers.co.za  This test is not yet approved or  cleared by the [place] FDA and has been authorized for detection and/or diagnosis of SARS-CoV-2 by FDA under an Emergency Use Authorization (EUA).  This EUA will remain in effect (meaning this test can be used) for the duration of the COVID-19 declaration under Section 564(b)(1) of the Act, 21 U.S.C. section 360bbb-3(b)(1), unless the authorization is terminated or revoked sooner.  Performed at  [facility], [address], [place], [place] [address]     Radiology Studies: CT Abdomen Pelvis W Contrast  Result Date: 12/18/2019 CLINICAL DATA:  Acute abdominal pain.  Dizziness. EXAM: CT ABDOMEN AND PELVIS WITH CONTRAST TECHNIQUE: Multidetector CT imaging of the abdomen and pelvis was performed using the standard protocol following bolus administration of intravenous contrast. CONTRAST:  66mL OMNIPAQUE IOHEXOL 300 MG/ML  SOLN COMPARISON:  CT 12/23/2015 FINDINGS: Lower chest: Mild hypoventilatory changes. Heart is normal in size. Tiny hiatal hernia. Stable 4 mm right middle lobe pulmonary nodule, series 4, image 10. Stability since 2017 consistent with benign process. No dedicated further follow-up is needed. Hepatobiliary: Post cholecystectomy with stable intra and extrahepatic biliary prominence. Distal common bile duct measures 9 mm. Coarse granuloma in the left lobe of the liver. No focal lesion. Pancreas: No ductal dilatation or inflammation. Spleen: Normal in size without focal abnormality. Adrenals/Urinary Tract: Left adrenal thickening without dominant nodule. Normal right adrenal gland. Large cyst arising from the upper left kidney measures 7.8 cm. There is no hydronephrosis. No perinephric edema. Previous left renal calculus is no longer seen. Symmetric renal excretion on delayed phase imaging. Right renal contours are lobulated, unchanged. Urinary bladder is unremarkable. Stomach/Bowel: Small hiatal hernia. Stomach otherwise unremarkable. There is no small bowel obstruction or inflammatory change. Normal appendix. Small volume of colonic stool. No colonic wall thickening. Mild sigmoid diverticulosis. No diverticulitis. Vascular/Lymphatic: Aortic atherosclerosis and mild tortuosity. No aortic aneurysm. Patent portal vein. Mesenteric vessels are patent. No abdominopelvic adenopathy. Reproductive: Uterus and right ovary are unremarkable. Similar appearing enlargement of the left ovary  from prior exam measuring 3.2 cm, previously 3.5  cm. Other: No ascites. No free air. No intra-abdominal fluid collection. Small fat containing umbilical hernia. Musculoskeletal: Multilevel degenerative disc disease in the spine. Scattered facet hypertrophy. There are no acute or suspicious osseous abnormalities. IMPRESSION: 1. No acute abnormality or explanation for abdominal pain. 2. Mild sigmoid diverticulosis without diverticulitis. 3. Small hiatal hernia. 4. Similar appearing enlargement of the left ovary from prior exam measuring 3.2 cm, previously 3.5 cm. While this is abnormal in the postmenopausal patient, the decrease in size suggests an overall benign etiology. Consider further evaluation with elective pelvic ultrasound. Aortic Atherosclerosis (ICD10-I70.0). Electronically Signed   By: Keith Rake M.D.   On: 12/18/2019 22:52   DG Chest Port 1 View  Result Date: 12/18/2019 CLINICAL DATA:  Hypotension. EXAM: PORTABLE CHEST 1 VIEW COMPARISON:  October 31, 2014 FINDINGS: Mild diffusely increased bronchovascular lung markings are noted without evidence of acute infiltrate, pleural effusion or pneumothorax. The heart size and mediastinal contours are within normal limits. Degenerative changes seen throughout the thoracic spine. IMPRESSION: No active disease. Electronically Signed   By: Virgina Norfolk M.D.   On: 12/18/2019 22:34      Idella Lamontagne T. Buckley  If 7PM-7AM, please contact night-coverage [email or web address] 12/19/2019, 12:17 PM

## 2019-12-19 NOTE — Progress Notes (Signed)
Pharmacy Antibiotic Note  Katie Bell is a 79 y.o. female admitted on 12/18/2019 with sepsis.  Pharmacy has been consulted for vanc/cefepime dosing.  Plan: Will give patient vanc 1.25g IV load then f/u w/ vanc 750 mg IV q24h.  Will continue cefepime 2g IV q12h per CrCl 30 - 60 ml/min. Will continue to monitor renal function and adjust per changes in renal function.  Goal 15 - 20 mcg/mL Ke 0.0315 T1/2 ~ 24 hrs  Height: 5' (152.4 cm) Weight: 51.2 kg (112 lb 14 oz) IBW/kg (Calculated) : 45.5  Temp (24hrs), Avg:99.9 F (37.7 C), Min:99.4 F (37.4 C), Max:100.4 F (38 C)  Recent Labs  Lab 12/14/19 1347 12/18/19 1514 12/18/19 2205  WBC 9.4 16.9*  --   CREATININE 1.04* 0.99  --   LATICACIDVEN  --   --  2.4*    Estimated Creatinine Clearance: 32.6 mL/min (by C-G formula based on SCr of 0.99 mg/dL).    Allergies  Allergen Reactions  . Sulfamethoxazole-Trimethoprim Swelling    Angioedema and pruritis per 01/01/2017 documentation in care everywhere  . Estradiol Other (See Comments)    Vaginal irritation  . Morphine     agitation  . Morphine And Related Anxiety    Thank you for allowing pharmacy to be a part of this patient's care.  Tobie Lords, PharmD, BCPS Clinical Pharmacist 12/19/2019 12:08 AM

## 2019-12-19 NOTE — H&P (Signed)
[place]   PATIENT NAME: Katie Bell    MR#:  [id number]  DATE OF BIRTH:  22-Jan-1941  DATE OF ADMISSION:  12/18/2019  PRIMARY CARE PHYSICIAN: Venia Carbon, MD   REQUESTING/REFERRING PHYSICIAN: Blake Divine, MD CHIEF COMPLAINT:   Chief Complaint  Patient presents with  . Weakness    HISTORY OF PRESENT ILLNESS:  Katie Bell  is a 79 y.o. Caucasian female with a known history of hypertension, GERD, depression and anxiety, presented generalized weakness and dizziness for the last few days with associated low-grade fever and abdominal pain in the right lower quadrant suprapubic area.  She denied any dyspnea or wheezing however does have a cough last night which is gotten better.  She has been vaccinated for COVID-19.  She denies any nausea or vomiting or diarrhea.  She has been having urinary frequency and urgency without dysuria, oliguria or hematuria or flank pain.  She been having diminished appetite and diminished urine output.  Upon presentation to the emergency room, temperature was 99.4, blood pressure was 140/54 with otherwise normal.  Labs revealed hypokalemia of 3 with otherwise unremarkable CMP.  Lactic acid was 2.4 and later 1.8 and procalcitonin less than 0.1.  CBC showed leukocytosis 16.9.  COVID-19 PCR came back negative.  Urinalysis came back with positive nitrite and 21-50 WBCs with rare bacteria.  Abdominal pelvic CT scan showed no acute abnormalities.  It showed mild sigmoid diverticulosis without diverticulitis and small hiatal hernia enlargement of the left ovary compared to prior exam.  The patient was given empiric therapy initially with IV cefepime and vancomycin as well as Flagyl, 1.75L IV lactated Ringer, 2 mg of IV morphine sulfate and I ordered 40 mEq p.o. potassium chloride.  The patient will be admitted to a medically monitored bed for further evaluation and management. PAST MEDICAL HISTORY:   Past Medical History:  Diagnosis Date  .  Anxiety   . Cancer ([place]) 2015   basal cell removed from leg  . Depression   . Episodic mood disorder ([place])    mostly anxiety--depression in the past  . Gastric ulcer    History  . GERD (gastroesophageal reflux disease)   . History of blood transfusion    At [facility] appro [facility] yrs ago  . History of hiatal hernia   . History of kidney stones 1960  . Hypertension   . Melanoma ([place]) 08/03/2014   left prox post lat thigh  . Osteoarthritis, hand    hand  . Postmenopausal disorder   . SVD (spontaneous vaginal delivery)    x 3    PAST SURGICAL HISTORY:   Past Surgical History:  Procedure Laterality Date  . CATARACT EXTRACTION W/PHACO Right 02/12/2019   Procedure: CATARACT EXTRACTION PHACO AND INTRAOCULAR LENS PLACEMENT (I[facility]);  Surgeon: Marchia Meiers, MD;  Location: [facility] ORS;  Service: Ophthalmology;  Laterality: Right;  [place] 01:34.2 CDE 17.97 Fluid Pack lot # [id number]  . CATARACT EXTRACTION W/PHACO Left 03/26/2019   Procedure: CATARACT EXTRACTION PHACO AND INTRAOCULAR LENS PLACEMENT ([place]) LEFT VISION BLUE;  Surgeon: Marchia Meiers, MD;  Location: [facility] ORS;  Service: Ophthalmology;  Laterality: Left;  [place] 01:16.2 CDE 11.70 FLUID PACK LOT # [id number]  . CHOLECYSTECTOMY  2008  . COLONOSCOPY  04/2011  . CYSTOCELE REPAIR N/A 12/31/2013   Procedure: ANTERIOR REPAIR (CYSTOCELE);  Surgeon: Donnamae Jude, MD;  Location: [facility];  Service: Gynecology;  Laterality: N/A;  . CYSTOSCOPY W/ URETERAL STENT PLACEMENT Left 01/16/2016  Procedure: CYSTOSCOPY WITH STENT REPLACEMENT;  Surgeon: Hollice Espy, MD;  Location: [facility] ORS;  Service: Urology;  Laterality: Left;  . CYSTOSCOPY WITH STENT PLACEMENT Left 12/23/2015   Procedure: CYSTOSCOPY WITH STENT PLACEMENT;  Surgeon: Ardis Hughs, MD;  Location: [facility] OR[facility];  Service: Urology;  Laterality: Left;  . DIAGNOSTIC LAPAROSCOPY    . EYE SURGERY    . kidney stone removed    . MELANOMA EXCISION  2015   left posterior thigh  . TONSILLECTOMY   1944  . TUBAL LIGATION    . UPPER GI ENDOSCOPY    . URETEROSCOPY WITH HOLMIUM LASER LITHOTRIPSY Left 01/16/2016   Procedure: URETEROSCOPY WITH HOLMIUM LASER LITHOTRIPSY;  Surgeon: Hollice Espy, MD;  Location: [facility] OR[facility];  Service: Urology;  Laterality: Left;  . WISDOM TOOTH EXTRACTION      SOCIAL HISTORY:   Social History   Tobacco Use  . Smoking status: Former Research scientist (life sciences)  . Smokeless tobacco: Never Used  Substance Use Topics  . Alcohol use: No    Alcohol/week: 0.0 standard drinks    FAMILY HISTORY:   Family History  Problem Relation Age of Onset  . Alzheimer's disease Mother   . Alcohol abuse Father   . COPD Brother   . Heart disease Neg Hx   . Cancer Neg Hx     DRUG ALLERGIES:   Allergies  Allergen Reactions  . Sulfamethoxazole-Trimethoprim Swelling    Angioedema and pruritis per 01/01/2017 documentation in care everywhere  . Estradiol Other (See Comments)    Vaginal irritation  . Morphine     agitation  . Morphine And Related Anxiety    REVIEW OF SYSTEMS:   ROS As per history of present illness. All pertinent systems were reviewed above. Constitutional, HEENT, cardiovascular, respiratory, GI, GU, musculoskeletal, neuro, psychiatric, endocrine, integumentary and hematologic systems were reviewed and are otherwise negative/unremarkable except for positive findings mentioned above in the HPI.   MEDICATIONS AT HOME:   Prior to Admission medications   Medication Sig Start Date End Date Taking? Authorizing Provider  amLODipine (NORVASC) 5 MG tablet TAKE 1 TABLET BY MOUTH ONCE DAILY 03/31/19   Venia Carbon, MD  Cholecalciferol (VITAMIN D) 400 UNITS capsule Take 800 Units by mouth daily.    [provider]  diazepam (VALIUM) 10 MG tablet TAKE 1/2 TO 1 TABLETS BY MOUTH 2 TIMES DAILY AS NEEDED FOR ANXIETY 12/03/19   Venia Carbon, MD  mupirocin ointment (BACTROBAN) 2 % Apply 1 application topically daily. Qd to excision site on left cheek 08/11/19    Ralene Bathe, MD  Omega-3 Fatty Acids (FISH OIL) 1000 MG CAPS Take 1,000 mg by mouth daily.     [provider]  potassium chloride SA (KLOR-CON) 20 MEQ tablet Take 1 tablet (20 mEq total) by mouth daily. 12/16/19   Venia Carbon, MD  vitamin C (ASCORBIC ACID) 500 MG tablet Take 500 mg by mouth daily.    [provider]      VITAL SIGNS:  Blood pressure 139/68, pulse 84, temperature 99.8 F (37.7 C), temperature source Oral, resp. rate 15, height 5' (1.524 m), weight 51.2 kg, SpO2 96 %.  PHYSICAL EXAMINATION:  Physical Exam  GENERAL:  79 y.o.-year-old Caucasian female patient lying in the bed with no acute distress.  EYES: Pupils equal, round, reactive to light and accommodation. No scleral icterus. Extraocular muscles intact.  HEENT: Head atraumatic, normocephalic. Oropharynx and nasopharynx clear.  NECK:  Supple, no jugular venous distention. No thyroid enlargement, no  tenderness.  LUNGS: Normal breath sounds bilaterally, no wheezing, rales,rhonchi or crepitation. No use of accessory muscles of respiration.  CARDIOVASCULAR: Regular rate and rhythm, S1, S2 normal. No murmurs, rubs, or gallops.  ABDOMEN: Soft, nondistended, nontender. Bowel sounds present. No organomegaly or mass.  EXTREMITIES: No pedal edema, cyanosis, or clubbing.  NEUROLOGIC: Cranial nerves II through XII are intact. Muscle strength 5/5 in all extremities. Sensation intact. Gait not checked.  PSYCHIATRIC: The patient is alert and oriented x 3.  Normal affect and good eye contact. SKIN: No obvious rash, lesion, or ulcer.   LABORATORY PANEL:   CBC Recent Labs  Lab 12/18/19 1514  WBC 16.9*  HGB 14.4  HCT 42.1  PLT 212   ------------------------------------------------------------------------------------------------------------------  Chemistries  Recent Labs  Lab 12/18/19 1514 12/19/19 0008  NA 138  --   K 3.0*  --   CL 103  --   CO2 22  --   GLUCOSE 133*  --   BUN 11  --     CREATININE 0.99  --   CALCIUM 9.4  --   MG  --  1.6*  AST 24  --   ALT 17  --   ALKPHOS 49  --   BILITOT 1.1  --    ------------------------------------------------------------------------------------------------------------------  Cardiac Enzymes No results for input(s): TROPONINI in the last 168 hours. ------------------------------------------------------------------------------------------------------------------  RADIOLOGY:  CT Abdomen Pelvis W Contrast  Result Date: 12/18/2019 CLINICAL DATA:  Acute abdominal pain.  Dizziness. EXAM: CT ABDOMEN AND PELVIS WITH CONTRAST TECHNIQUE: Multidetector CT imaging of the abdomen and pelvis was performed using the standard protocol following bolus administration of intravenous contrast. CONTRAST:  58mL OMNIPAQUE IOHEXOL 300 MG/ML  SOLN COMPARISON:  CT 12/23/2015 FINDINGS: Lower chest: Mild hypoventilatory changes. Heart is normal in size. Tiny hiatal hernia. Stable 4 mm right middle lobe pulmonary nodule, series 4, image 10. Stability since 2017 consistent with benign process. No dedicated further follow-up is needed. Hepatobiliary: Post cholecystectomy with stable intra and extrahepatic biliary prominence. Distal common bile duct measures 9 mm. Coarse granuloma in the left lobe of the liver. No focal lesion. Pancreas: No ductal dilatation or inflammation. Spleen: Normal in size without focal abnormality. Adrenals/Urinary Tract: Left adrenal thickening without dominant nodule. Normal right adrenal gland. Large cyst arising from the upper left kidney measures 7.8 cm. There is no hydronephrosis. No perinephric edema. Previous left renal calculus is no longer seen. Symmetric renal excretion on delayed phase imaging. Right renal contours are lobulated, unchanged. Urinary bladder is unremarkable. Stomach/Bowel: Small hiatal hernia. Stomach otherwise unremarkable. There is no small bowel obstruction or inflammatory change. Normal appendix. Small volume of  colonic stool. No colonic wall thickening. Mild sigmoid diverticulosis. No diverticulitis. Vascular/Lymphatic: Aortic atherosclerosis and mild tortuosity. No aortic aneurysm. Patent portal vein. Mesenteric vessels are patent. No abdominopelvic adenopathy. Reproductive: Uterus and right ovary are unremarkable. Similar appearing enlargement of the left ovary from prior exam measuring 3.2 cm, previously 3.5 cm. Other: No ascites. No free air. No intra-abdominal fluid collection. Small fat containing umbilical hernia. Musculoskeletal: Multilevel degenerative disc disease in the spine. Scattered facet hypertrophy. There are no acute or suspicious osseous abnormalities. IMPRESSION: 1. No acute abnormality or explanation for abdominal pain. 2. Mild sigmoid diverticulosis without diverticulitis. 3. Small hiatal hernia. 4. Similar appearing enlargement of the left ovary from prior exam measuring 3.2 cm, previously 3.5 cm. While this is abnormal in the postmenopausal patient, the decrease in size suggests an overall benign etiology. Consider further evaluation with elective pelvic ultrasound.  Aortic Atherosclerosis (ICD10-I70.0). Electronically Signed   By: Keith Rake M.D.   On: 12/18/2019 22:52   DG Chest Port 1 View  Result Date: 12/18/2019 CLINICAL DATA:  Hypotension. EXAM: PORTABLE CHEST 1 VIEW COMPARISON:  October 31, 2014 FINDINGS: Mild diffusely increased bronchovascular lung markings are noted without evidence of acute infiltrate, pleural effusion or pneumothorax. The heart size and mediastinal contours are within normal limits. Degenerative changes seen throughout the thoracic spine. IMPRESSION: No active disease. Electronically Signed   By: Virgina Norfolk M.D.   On: 12/18/2019 22:34      IMPRESSION AND PLAN:   1.  Sepsis likely secondary to UTI without severe sepsis or septic shock.  The patient has tachycardia of 118 and leukocytosis.  The patient has subsequent generalized weakness. -The patient  will be admitted to a medical monitored bed. -We will follow blood and urine cultures. -We will continue on IV Rocephin. -We will follow lactic acid level. -The patient will be hydrated with IV normal saline.  2.  Hypokalemia. -Potassium will be replaced and magnesium level will be checked.  3.  Lower abdominal pain. -This like secondary to her UTI.  Abdominal CT scan Negative. -Management as above.  4.  Hypertension. -We will continue amlodipine.  5.  Anxiety. -Valium will be continued.  6.  DVT prophylaxis. -Subcutaneous Lovenox   All the records are reviewed and case discussed with ED provider. The plan of care was discussed in details with the patient (and family). I answered all questions. The patient agreed to proceed with the above mentioned plan. Further management will depend upon hospital course.   CODE STATUS: Full code  Status is: Inpatient  Remains inpatient appropriate because:Ongoing diagnostic testing needed not appropriate for outpatient work up, Unsafe d/c plan, IV treatments appropriate due to intensity of illness or inability to take PO and Inpatient level of care appropriate due to severity of illness   Dispo: The patient is from: Home              Anticipated d/c is to: Home              Anticipated d/c date is: 2 days              Patient currently is not medically stable to d/c.   TOTAL TIME TAKING CARE OF THIS PATIENT: 55 minutes.    Christel Mormon M.D on 12/19/2019 at 2:09 AM  [facility]   From 7 PM-7 AM, contact night-coverage [email or web address]  CC: Primary care physician; Venia Carbon, MD   Note: This dictation was prepared with Dragon dictation along with smaller phrase technology. Any transcriptional typo errors that result from this process are unintentional.

## 2019-12-19 NOTE — ED Notes (Signed)
Pt ambulated to the bathroom with walker.

## 2019-12-19 NOTE — Progress Notes (Signed)
Son is at bedside

## 2019-12-20 DIAGNOSIS — I10 Essential (primary) hypertension: Secondary | ICD-10-CM

## 2019-12-20 LAB — CBC
HCT: 33.6 % — ABNORMAL LOW (ref 36.0–46.0)
Hemoglobin: 11.3 g/dL — ABNORMAL LOW (ref 12.0–15.0)
MCH: 30.7 pg (ref 26.0–34.0)
MCHC: 33.6 g/dL (ref 30.0–36.0)
MCV: 91.3 fL (ref 80.0–100.0)
Platelets: 176 10*3/uL (ref 150–400)
RBC: 3.68 MIL/uL — ABNORMAL LOW (ref 3.87–5.11)
RDW: 11.9 % (ref 11.5–15.5)
WBC: 8.2 10*3/uL (ref 4.0–10.5)
nRBC: 0 % (ref 0.0–0.2)

## 2019-12-20 LAB — RENAL FUNCTION PANEL
Albumin: 3.2 g/dL — ABNORMAL LOW (ref 3.5–5.0)
Anion gap: 4 — ABNORMAL LOW (ref 5–15)
BUN: 8 mg/dL (ref 8–23)
CO2: 20 mmol/L — ABNORMAL LOW (ref 22–32)
Calcium: 8.2 mg/dL — ABNORMAL LOW (ref 8.9–10.3)
Chloride: 110 mmol/L (ref 98–111)
Creatinine, Ser: 0.9 mg/dL (ref 0.44–1.00)
GFR calc Af Amer: >60 mL/min (ref >60)
GFR calc non Af Amer: >60 mL/min (ref >60)
Glucose, Bld: 90 mg/dL (ref 70–99)
Phosphorus: 2.3 mg/dL — ABNORMAL LOW (ref 2.5–4.6)
Potassium: 3.3 mmol/L — ABNORMAL LOW (ref 3.5–5.1)
Sodium: 134 mmol/L — ABNORMAL LOW (ref 135–145)

## 2019-12-20 LAB — CORTISOL-AM, BLOOD: Cortisol - AM: 15.1 ug/dL (ref 6.7–22.6)

## 2019-12-20 LAB — PROCALCITONIN: Procalcitonin: <0.1 ng/mL

## 2019-12-20 LAB — MAGNESIUM: Magnesium: 2.1 mg/dL (ref 1.7–2.4)

## 2019-12-20 MED ORDER — CEFDINIR 300 MG PO CAPS: 300.0000 mg | ORAL_CAPSULE | Freq: Two times a day (BID) | ORAL | 0 refills | Status: AC

## 2019-12-20 MED ORDER — POTASSIUM CHLORIDE 20 MEQ PO PACK
40.0000 meq | PACK | ORAL | Status: AC
  Administered 2019-12-20 (×2): 40 meq via ORAL
  Filled 2019-12-20 (×3): qty 2

## 2019-12-20 NOTE — Telephone Encounter (Signed)
She was admitted overnight and then sent home Potassium was better (3) upon admission Please check on her and make sure she has follow up appt with 7-10 days or so

## 2019-12-20 NOTE — TOC Transition Note (Addendum)
Transition of Care [facility]) - CM/SW Discharge Note   Patient Details  Name: KEYMONI MCCASTER MRN: [id number] Date of Birth: [birth year]  Transition of Care [facility]) CM/SW Contact:  Ova Freshwater Phone Number: [phone] 12/20/2019, 12:26 PM   Clinical Narrative:     CSW spoke with patient's  Johney Maine [phone] to update on patient's status for disposition.  CSW explained to Mr. Thedore Mins about home health services and he verbalized understanding.  Mr. Thedore Mins did not have a preference for home health agency, CW contacted [facility] and left message for Drue Novel.  CSW explained process and timeline for home health to Mr. Thedore Mins and asked him to contact CSW if he was not contacted by [facility] in 48 hours.  Mr. Thedore Mins verbalized understanding. TOC consult complete.  Final next level of care: Home/Self Care Barriers to Discharge: No Barriers Identified   Patient Goals and CMS Choice Patient states their goals for this hospitalization and ongoing recovery are:: To go back home soon CMS Medicare.gov Compare Post Acute Care list provided to:: Patient Represenative (must comment) [name] (Son) [phone]) Choice offered to / list presented to : Adult Children  Discharge Placement                       Discharge Plan and Services In-house Referral: Clinical Social Work   Post Acute Care Choice: Home Health                    HH Arranged: PT [place] Agency: [facility] (now [facility]) Date [place]: 12/20/19 Time [place]: 1224 Representative spoke with at [place]: [place] (SDOH) Interventions     Readmission Risk Interventions No flowsheet data found.

## 2019-12-20 NOTE — Plan of Care (Signed)
Discharge teaching completed with patient's son, patient is in stable condition.

## 2019-12-20 NOTE — Discharge Summary (Signed)
Physician Discharge Summary  Patient ID: Katie Bell MRN: [id number] DOB/AGE: 80-Jan-1942 80 y.o.  Admit date: 12/18/2019 Discharge date: 12/20/2019  Admission Diagnoses:  Discharge Diagnoses:  Active Problems:   Abdominal pain   Discharged Condition: good  Hospital Course:  79 year old female with history of HTN, anxiety, depression and GERD presenting with generalized weakness, dizziness, RLQ pain, increased urinary frequency and urgency, and low-grade fever "few days", admitted for sepsis due to urinary tract infection.  In ED, febrile to 100.4.  Tachycardic to 118.  BP 140/54> 89/35> 118/56.  WBC 17.  K3.0.  Glucose 133.  Otherwise CBC and CMP without significant finding.  Lactic acid 2.4> 1.8 after IV fluid.  Pro-Cal negative.  UA concerning for UTI.  CXR and CT abdomen and pelvis without acute finding.  Cultures obtained.  Resuscitated with IV fluid.  Started on IV cefepime, vancomycin and Flagyl.  Admitted for sepsis likely due to urinary tract infection.  #1.  Severe sepsis with urinary tract infection. Patient was giving IV fluids, IV antibiotics with Rocephin. Condition much improved.  Leukocytosis resolved.  Procalcitonin level less than 0.1.  Blood culture came back negative.  Urine culture was not sent.  At this point, patient is medically stable to be discharged.  He will be given additional 3 days of cefdinir for UTI.  2.  Hypokalemia. Supplemented in hospital.  Continue oral supplement.  3.  Hypomagnesemia. Magnesium level normalized.  4.  Ovarian cyst. Advised the patient to follow with OB/GYN as outpatient.  5.  Essential hypertension with initial hypotension. Hypotension has resolved.  Resume home blood pressure medicines.      Consults: None  Significant Diagnostic Studies:  CT ABDOMEN AND PELVIS WITH CONTRAST  TECHNIQUE: Multidetector CT imaging of the abdomen and pelvis was performed using the standard protocol following bolus administration  of intravenous contrast.  CONTRAST:  50mL OMNIPAQUE IOHEXOL 300 MG/ML  SOLN  COMPARISON:  CT 12/23/2015  FINDINGS: Lower chest: Mild hypoventilatory changes. Heart is normal in size. Tiny hiatal hernia. Stable 4 mm right middle lobe pulmonary nodule, series 4, image 10. Stability since 2017 consistent with benign process. No dedicated further follow-up is needed.  Hepatobiliary: Post cholecystectomy with stable intra and extrahepatic biliary prominence. Distal common bile duct measures 9 mm. Coarse granuloma in the left lobe of the liver. No focal lesion.  Pancreas: No ductal dilatation or inflammation.  Spleen: Normal in size without focal abnormality.  Adrenals/Urinary Tract: Left adrenal thickening without dominant nodule. Normal right adrenal gland. Large cyst arising from the upper left kidney measures 7.8 cm. There is no hydronephrosis. No perinephric edema. Previous left renal calculus is no longer seen. Symmetric renal excretion on delayed phase imaging. Right renal contours are lobulated, unchanged. Urinary bladder is unremarkable.  Stomach/Bowel: Small hiatal hernia. Stomach otherwise unremarkable. There is no small bowel obstruction or inflammatory change. Normal appendix. Small volume of colonic stool. No colonic wall thickening. Mild sigmoid diverticulosis. No diverticulitis.  Vascular/Lymphatic: Aortic atherosclerosis and mild tortuosity. No aortic aneurysm. Patent portal vein. Mesenteric vessels are patent. No abdominopelvic adenopathy.  Reproductive: Uterus and right ovary are unremarkable. Similar appearing enlargement of the left ovary from prior exam measuring 3.2 cm, previously 3.5 cm.  Other: No ascites. No free air. No intra-abdominal fluid collection. Small fat containing umbilical hernia.  Musculoskeletal: Multilevel degenerative disc disease in the spine. Scattered facet hypertrophy. There are no acute or suspicious osseous  abnormalities.  IMPRESSION: 1. No acute abnormality or explanation for abdominal pain. 2. Mild  sigmoid diverticulosis without diverticulitis. 3. Small hiatal hernia. 4. Similar appearing enlargement of the left ovary from prior exam measuring 3.2 cm, previously 3.5 cm. While this is abnormal in the postmenopausal patient, the decrease in size suggests an overall benign etiology. Consider further evaluation with elective pelvic ultrasound.  Aortic Atherosclerosis (ICD10-I70.0).   Electronically Signed   By: Keith Rake M.D.   On: 12/18/2019 22:52  PORTABLE CHEST 1 VIEW  COMPARISON:  October 31, 2014  FINDINGS: Mild diffusely increased bronchovascular lung markings are noted without evidence of acute infiltrate, pleural effusion or pneumothorax. The heart size and mediastinal contours are within normal limits. Degenerative changes seen throughout the thoracic spine.  IMPRESSION: No active disease.   Electronically Signed   By: Virgina Norfolk M.D.   On: 12/18/2019 22:34   Treatments: antibiotics: ceftriaxone  Discharge Exam: Blood pressure (!) 125/58, pulse 74, temperature 97.7 F (36.5 C), temperature source Oral, resp. rate 16, height 5\' 2"  (1.575 m), weight 51.4 kg, SpO2 98 %. General appearance: alert, cooperative and Oriented to time place and person. Resp: clear to auscultation bilaterally Cardio: regular rate and rhythm, S1, S2 normal, no murmur, click, rub or gallop GI: soft, non-tender; bowel sounds normal; no masses,  no organomegaly Extremities: extremities normal, atraumatic, no cyanosis or edema  Disposition: Discharge disposition: 01-Home or Self Care       Discharge Instructions    Diet - low sodium heart healthy   Complete by: As directed    Increase activity slowly   Complete by: As directed      Allergies as of 12/20/2019      Reactions   Sulfamethoxazole-trimethoprim Swelling   Angioedema and pruritis per 01/01/2017  documentation in care everywhere   Estradiol Other (See Comments)   Vaginal irritation   Morphine    agitation   Morphine And Related Anxiety      Medication List    TAKE these medications   amLODipine 5 MG tablet Commonly known as: NORVASC TAKE 1 TABLET BY MOUTH ONCE DAILY   cefdinir 300 MG capsule Commonly known as: OMNICEF Take 1 capsule (300 mg total) by mouth 2 (two) times daily for 3 days.   diazepam 10 MG tablet Commonly known as: VALIUM TAKE 1/2 TO 1 TABLETS BY MOUTH 2 TIMES DAILY AS NEEDED FOR ANXIETY What changed: See the new instructions.   Fish Oil 1000 MG Caps Take 1,000 mg by mouth daily.   mupirocin ointment 2 % Commonly known as: BACTROBAN Apply 1 application topically daily. Qd to excision site on left cheek   potassium chloride SA 20 MEQ tablet Commonly known as: KLOR-CON Take 1 tablet (20 mEq total) by mouth daily.   vitamin C 500 MG tablet Commonly known as: ASCORBIC ACID Take 500 mg by mouth daily.   Vitamin D 400 units capsule Take 800 Units by mouth daily.       Follow-up Information    Viviana Simpler I, MD Follow up in 1 week(s).   Specialties: Internal Medicine, Pediatrics Contact information: [place]  [id number] [phone]               Signed: Sharen Hones 12/20/2019, 8:39 AM

## 2019-12-21 ENCOUNTER — Telehealth: Payer: Self-pay | Admitting: Internal Medicine

## 2019-12-21 ENCOUNTER — Telehealth: Payer: Self-pay

## 2019-12-21 NOTE — Telephone Encounter (Signed)
Pt discharged from [facility] 12/21/19 And she needs help with what medication she needs to take.  She stated there is no information on her discharged sheet Please call pt to discuss  Pt schedule phone visit 12/23/19 she stated she still has a fever

## 2019-12-21 NOTE — Telephone Encounter (Signed)
Left message on voicemail to return call- need to complete TCM. Follow up visit scheduled 12/23/2019 @ 9:30 am with Dr. Silvio Pate.

## 2019-12-21 NOTE — Telephone Encounter (Signed)
Pt has apt on 12/23/19 which was scheduled by [place].   Contacted pt who reports she is doing much better since returning home. She has a VV with PCP on 9/1 which will be a phone visit. Advised if anything is needed before the apt to contact the office. Pt verbalized understanding.

## 2019-12-21 NOTE — Telephone Encounter (Signed)
Transition Care Management Follow-up Telephone Call  Date of discharge and from where: 12/20/2019, [facility]  How have you been since you were released from the hospital? Patient states that she is doing okay. Patient states that she does not have a fever now either.   Any questions or concerns? No  Items Reviewed:  Did the pt receive and understand the discharge instructions provided? Yes   Medications obtained and verified? Yes   Any new allergies since your discharge? No   Dietary orders reviewed? Yes  Do you have support at home? Yes   Functional Questionnaire: (I = Independent and D = Dependent) ADLs: I  Bathing/Dressing- I  Meal Prep- I  Eating- I  Maintaining continence- I  Transferring/Ambulation- I  Managing Meds- I  Follow up appointments reviewed:   PCP Hospital f/u appt confirmed? Yes  Scheduled to see Dr. Silvio Pate on 12/23/2019 @ 9:30 am.  [facility] f/u appt confirmed? N/A   Are transportation arrangements needed? No   If their condition worsens, is the pt aware to call PCP or go to the Emergency Dept.? Yes  Was the patient provided with contact information for the PCP's office or ED? Yes  Was to pt encouraged to call back with questions or concerns? Yes

## 2019-12-22 NOTE — Telephone Encounter (Signed)
LMV that apt for tomorrow needs to be changed to OV.

## 2019-12-22 NOTE — Telephone Encounter (Signed)
Noted A virtual visit, especially by phone, is not really appropriate in this situation. I would really like to see her--and should check her potassium again

## 2019-12-23 ENCOUNTER — Other Ambulatory Visit: Payer: Self-pay

## 2019-12-23 ENCOUNTER — Encounter: Payer: Self-pay | Admitting: Internal Medicine

## 2019-12-23 ENCOUNTER — Telehealth: Payer: Self-pay | Admitting: Internal Medicine

## 2019-12-23 ENCOUNTER — Telehealth (INDEPENDENT_AMBULATORY_CARE_PROVIDER_SITE_OTHER): Payer: Medicare HMO | Admitting: Internal Medicine

## 2019-12-23 DIAGNOSIS — R1010 Upper abdominal pain, unspecified: Secondary | ICD-10-CM | POA: Diagnosis not present

## 2019-12-23 DIAGNOSIS — E871 Hypo-osmolality and hyponatremia: Secondary | ICD-10-CM | POA: Diagnosis not present

## 2019-12-23 LAB — CULTURE, BLOOD (ROUTINE X 2)
Culture: NO GROWTH
Culture: NO GROWTH
Special Requests: ADEQUATE
Special Requests: ADEQUATE

## 2019-12-23 NOTE — Telephone Encounter (Signed)
Faythe Ghee That is better than nothing and I can try to get her in next week in person after speaking to her

## 2019-12-23 NOTE — Telephone Encounter (Signed)
Pt had apt today to address questions.

## 2019-12-23 NOTE — Assessment & Plan Note (Signed)
Mild on last blood work Potassium was also low--but closer to normal May be nutritional Will plan to recheck at follow up appointment

## 2019-12-23 NOTE — Telephone Encounter (Signed)
Left message to return call. Attempting to start 9:30 telephone visit.

## 2019-12-23 NOTE — Progress Notes (Signed)
Subjective:    Patient ID: Katie Bell, female    DOB: 1941-02-02, 79 y.o.   MRN: [id number]  HPI Telephone virtual visit for hospital follow up Identification done Reviewed limitations and billing and she gave consent Participants--patient in her home and I am in my office  Reviewed hospital records and discharge summary I had someone check on her due to low potassium She noted abdominal discomfort--and was admitted for presumed sepsis Empiric antibiotics--but procalcitonin was normal Blood cultures negative Urine culture not done--but she had no symptoms  Abdominal is "a lot better" Indicates it is near her stomach Abdominal CT showed small hiatal hernia No heartburn No dysphagia Occasional nausea---nothing recent  Current Outpatient Medications on File Prior to Visit  Medication Sig Dispense Refill  . amLODipine (NORVASC) 5 MG tablet TAKE 1 TABLET BY MOUTH ONCE DAILY (Patient taking differently: Take 5 mg by mouth daily. ) 90 tablet 3  . diazepam (VALIUM) 10 MG tablet TAKE 1/2 TO 1 TABLETS BY MOUTH 2 TIMES DAILY AS NEEDED FOR ANXIETY (Patient taking differently: Take 5-10 mg by mouth every 12 (twelve) hours as needed. ) 30 tablet 0  . potassium chloride SA (KLOR-CON) 20 MEQ tablet Take 1 tablet (20 mEq total) by mouth daily. (Patient taking differently: Take 10 mEq by mouth 2 (two) times daily. ) 30 tablet 11  . vitamin C (ASCORBIC ACID) 500 MG tablet Take 500 mg by mouth daily.     . cefdinir (OMNICEF) 300 MG capsule Take 1 capsule (300 mg total) by mouth 2 (two) times daily for 3 days. (Patient not taking: Reported on 12/23/2019) 6 capsule 0  . Cholecalciferol (VITAMIN D) 400 UNITS capsule Take 800 Units by mouth daily. (Patient not taking: Reported on 12/23/2019)    . mupirocin ointment (BACTROBAN) 2 % Apply 1 application topically daily. Qd to excision site on left cheek (Patient not taking: Reported on 12/23/2019) 22 g 0  . Omega-3 Fatty Acids (FISH OIL) 1000 MG CAPS Take  1,000 mg by mouth daily.  (Patient not taking: Reported on 12/19/2019)     No current facility-administered medications on file prior to visit.    Allergies  Allergen Reactions  . Sulfamethoxazole-Trimethoprim Swelling    Angioedema and pruritis per 01/01/2017 documentation in care everywhere  . Estradiol Other (See Comments)    Vaginal irritation  . Morphine     agitation  . Morphine And Related Anxiety    Past Medical History:  Diagnosis Date  . Anxiety   . Cancer ([place]) 2015   basal cell removed from leg  . Depression   . Episodic mood disorder ([place])    mostly anxiety--depression in the past  . Gastric ulcer    History  . GERD (gastroesophageal reflux disease)   . History of blood transfusion    At [facility] appro 15 yrs ago  . History of hiatal hernia   . History of kidney stones 1960  . Hypertension   . Melanoma ([place]) 08/03/2014   left prox post lat thigh  . Osteoarthritis, hand    hand  . Postmenopausal disorder   . SVD (spontaneous vaginal delivery)    x 3    Past Surgical History:  Procedure Laterality Date  . CATARACT EXTRACTION W/PHACO Right 02/12/2019   Procedure: CATARACT EXTRACTION PHACO AND INTRAOCULAR LENS PLACEMENT (IOC);  Surgeon: Marchia Meiers, MD;  Location: [facility] OR[facility];  Service: Ophthalmology;  Laterality: Right;  [place] 01:34.2 CDE 17.97 Fluid Pack lot # [id number]  .  CATARACT EXTRACTION W/PHACO Left 03/26/2019   Procedure: CATARACT EXTRACTION PHACO AND INTRAOCULAR LENS PLACEMENT ([place]) LEFT VISION BLUE;  Surgeon: Marchia Meiers, MD;  Location: [facility] ORS;  Service: Ophthalmology;  Laterality: Left;  [place] 01:16.2 CDE 11.70 FLUID PACK LOT # [id number]  . CHOLECYSTECTOMY  2008  . COLONOSCOPY  04/2011  . CYSTOCELE REPAIR N/A 12/31/2013   Procedure: ANTERIOR REPAIR (CYSTOCELE);  Surgeon: Donnamae Jude, MD;  Location: [place] [facility];  Service: Gynecology;  Laterality: N/A;  . CYSTOSCOPY W/ URETERAL STENT PLACEMENT Left 01/16/2016   Procedure: CYSTOSCOPY WITH  STENT REPLACEMENT;  Surgeon: Hollice Espy, MD;  Location: [facility] ORS;  Service: Urology;  Laterality: Left;  . CYSTOSCOPY WITH STENT PLACEMENT Left 12/23/2015   Procedure: CYSTOSCOPY WITH STENT PLACEMENT;  Surgeon: Ardis Hughs, MD;  Location: [facility] ORS;  Service: Urology;  Laterality: Left;  . DIAGNOSTIC LAPAROSCOPY    . EYE SURGERY    . kidney stone removed    . MELANOMA EXCISION  2015   left posterior thigh  . TONSILLECTOMY  1944  . TUBAL LIGATION    . UPPER GI ENDOSCOPY    . URETEROSCOPY WITH HOLMIUM LASER LITHOTRIPSY Left 01/16/2016   Procedure: URETEROSCOPY WITH HOLMIUM LASER LITHOTRIPSY;  Surgeon: Hollice Espy, MD;  Location: [facility];  Service: Urology;  Laterality: Left;  . WISDOM TOOTH EXTRACTION      Family History  Problem Relation Age of Onset  . Alzheimer's disease Mother   . Alcohol abuse Father   . COPD Brother   . Heart disease Neg Hx   . Cancer Neg Hx     Social History   Socioeconomic History  . Marital status: Divorced    Spouse name: Not on file  . Number of children: 3  . Years of education: Not on file  . Highest education level: Not on file  Occupational History  . 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 Smoking status: Former Research scientist (life sciences)  . Smokeless tobacco: Never Used  Substance and Sexual Activity  . Alcohol use: No    Alcohol/week: 0.0 standard drinks  . Drug use: No  . Sexual activity: Yes    Birth control/protection: Post-menopausal  Other Topics Concern  . Not on file  Social History Narrative   3 sons--- middle one lives with her   [name]      Has living will   Son [name]. Then son [name]   Would accept resuscitation attempts but no prolonged ventilation   No tube feeds if cognitively unaware      Social Determinants of Health   Financial Resource Strain:   . Difficulty of Paying Living Expenses: Not on file  Food Insecurity:   . Worried About Paediatric nurse in the Last Year: Not on file  . Ran Out of Food in the Last Year: Not on file  Transportation Needs:   . Lack of Transportation (Medical): Not on file  . Lack of Transportation (Non-Medical): Not on file  Physical Activity:   . Days of Exercise per Week: Not on file  . Minutes of Exercise per Session: Not on file  Stress:   . Feeling of Stress : Not on file  Social Connections:   . Frequency of Communication with Friends and Family: Not on file  . Frequency of Social Gatherings with Friends and Family: Not on file  . Attends Religious Services: Not on file  .  Active Member of Clubs or Organizations: Not on file  . Attends Archivist Meetings: Not on file  . Marital Status: Not on file  Intimate Partner Violence:   . Fear of Current or Ex-Partner: Not on file  . Emotionally Abused: Not on file  . Physically Abused: Not on file  . Sexually Abused: Not on file   Phone call lasted 11 minutes  Review of Systems Eating okay Bowels moving regularly now (since home) Is taking the potassium supplement No SOB    Objective:   Physical Exam Neurological:     Mental Status: She is alert.  Psychiatric:     Comments: Voice is strong  Sounds normal            Assessment & Plan:

## 2019-12-23 NOTE — Telephone Encounter (Signed)
F/u with Robin who scheduled apt. She reports she scheduled the apt as a phone visit because the pt had a fever and could not do a video visit and could also not come in the office with that symptom.  Left another VM for pt to assess for symptoms.

## 2019-12-23 NOTE — Assessment & Plan Note (Signed)
Vague No evidence of infection No clear symptoms from hiatal hernia--will hold off on Rx Will arrange in person follow up in the next few weeks

## 2019-12-24 ENCOUNTER — Other Ambulatory Visit: Payer: Self-pay | Admitting: Internal Medicine

## 2019-12-24 DIAGNOSIS — I10 Essential (primary) hypertension: Secondary | ICD-10-CM | POA: Diagnosis not present

## 2019-12-24 DIAGNOSIS — N83202 Unspecified ovarian cyst, left side: Secondary | ICD-10-CM | POA: Diagnosis not present

## 2019-12-24 DIAGNOSIS — E876 Hypokalemia: Secondary | ICD-10-CM | POA: Diagnosis not present

## 2019-12-24 DIAGNOSIS — K219 Gastro-esophageal reflux disease without esophagitis: Secondary | ICD-10-CM | POA: Diagnosis not present

## 2019-12-24 DIAGNOSIS — N390 Urinary tract infection, site not specified: Secondary | ICD-10-CM | POA: Diagnosis not present

## 2019-12-24 DIAGNOSIS — A419 Sepsis, unspecified organism: Secondary | ICD-10-CM | POA: Diagnosis not present

## 2019-12-24 DIAGNOSIS — F329 Major depressive disorder, single episode, unspecified: Secondary | ICD-10-CM | POA: Diagnosis not present

## 2019-12-24 DIAGNOSIS — F419 Anxiety disorder, unspecified: Secondary | ICD-10-CM | POA: Diagnosis not present

## 2019-12-25 ENCOUNTER — Other Ambulatory Visit: Payer: Self-pay | Admitting: Internal Medicine

## 2019-12-29 ENCOUNTER — Telehealth: Payer: Self-pay | Admitting: *Deleted

## 2019-12-29 NOTE — Telephone Encounter (Signed)
Colletta Maryland with [facility] left a voicemail wanting to add orders for nursing for medication and disease process. Colletta Maryland stated that patient was recently in the hospital with sepsis from a UTI. Colletta Maryland requested a call back with verbal orders.

## 2019-12-30 ENCOUNTER — Other Ambulatory Visit: Payer: Self-pay | Admitting: Internal Medicine

## 2019-12-30 DIAGNOSIS — N390 Urinary tract infection, site not specified: Secondary | ICD-10-CM | POA: Diagnosis not present

## 2019-12-30 DIAGNOSIS — N83202 Unspecified ovarian cyst, left side: Secondary | ICD-10-CM | POA: Diagnosis not present

## 2019-12-30 DIAGNOSIS — F329 Major depressive disorder, single episode, unspecified: Secondary | ICD-10-CM | POA: Diagnosis not present

## 2019-12-30 DIAGNOSIS — A419 Sepsis, unspecified organism: Secondary | ICD-10-CM | POA: Diagnosis not present

## 2019-12-30 DIAGNOSIS — K219 Gastro-esophageal reflux disease without esophagitis: Secondary | ICD-10-CM | POA: Diagnosis not present

## 2019-12-30 DIAGNOSIS — E876 Hypokalemia: Secondary | ICD-10-CM | POA: Diagnosis not present

## 2019-12-30 DIAGNOSIS — F419 Anxiety disorder, unspecified: Secondary | ICD-10-CM | POA: Diagnosis not present

## 2019-12-30 DIAGNOSIS — I10 Essential (primary) hypertension: Secondary | ICD-10-CM | POA: Diagnosis not present

## 2019-12-30 NOTE — Telephone Encounter (Signed)
Stephanie notified as instructed. Colletta Maryland stated that they would also like an order to send in a Education officer, museum because patient has a lot of confusion. Colletta Maryland stated that they are hoping for the Social Worker to help the patient with some planning for the future.

## 2019-12-30 NOTE — Telephone Encounter (Signed)
That is okay.

## 2019-12-31 ENCOUNTER — Other Ambulatory Visit: Payer: Self-pay | Admitting: Internal Medicine

## 2019-12-31 NOTE — Telephone Encounter (Signed)
Last filled 12-03-19 #30 Last OV 09-14-19 Next OV 01-05-20 [place]

## 2019-12-31 NOTE — Telephone Encounter (Signed)
Spoke to Stephanie.

## 2019-12-31 NOTE — Telephone Encounter (Signed)
That is also okay

## 2020-01-01 DIAGNOSIS — I10 Essential (primary) hypertension: Secondary | ICD-10-CM | POA: Diagnosis not present

## 2020-01-01 DIAGNOSIS — A419 Sepsis, unspecified organism: Secondary | ICD-10-CM | POA: Diagnosis not present

## 2020-01-01 DIAGNOSIS — K219 Gastro-esophageal reflux disease without esophagitis: Secondary | ICD-10-CM | POA: Diagnosis not present

## 2020-01-01 DIAGNOSIS — F419 Anxiety disorder, unspecified: Secondary | ICD-10-CM | POA: Diagnosis not present

## 2020-01-01 DIAGNOSIS — N390 Urinary tract infection, site not specified: Secondary | ICD-10-CM | POA: Diagnosis not present

## 2020-01-01 DIAGNOSIS — N83202 Unspecified ovarian cyst, left side: Secondary | ICD-10-CM | POA: Diagnosis not present

## 2020-01-01 DIAGNOSIS — F329 Major depressive disorder, single episode, unspecified: Secondary | ICD-10-CM | POA: Diagnosis not present

## 2020-01-01 DIAGNOSIS — E876 Hypokalemia: Secondary | ICD-10-CM | POA: Diagnosis not present

## 2020-01-05 ENCOUNTER — Other Ambulatory Visit: Payer: Self-pay

## 2020-01-05 ENCOUNTER — Ambulatory Visit (INDEPENDENT_AMBULATORY_CARE_PROVIDER_SITE_OTHER): Payer: Medicare HMO | Admitting: Internal Medicine

## 2020-01-05 ENCOUNTER — Encounter: Payer: Self-pay | Admitting: Internal Medicine

## 2020-01-05 VITALS — BP 120/80 | HR 83 | Temp 97.6°F | Ht 62.0 in | Wt 111.0 lb

## 2020-01-05 DIAGNOSIS — R109 Unspecified abdominal pain: Secondary | ICD-10-CM | POA: Diagnosis not present

## 2020-01-05 DIAGNOSIS — N760 Acute vaginitis: Secondary | ICD-10-CM | POA: Diagnosis not present

## 2020-01-05 DIAGNOSIS — I10 Essential (primary) hypertension: Secondary | ICD-10-CM | POA: Diagnosis not present

## 2020-01-05 DIAGNOSIS — E871 Hypo-osmolality and hyponatremia: Secondary | ICD-10-CM | POA: Diagnosis not present

## 2020-01-05 DIAGNOSIS — N83202 Unspecified ovarian cyst, left side: Secondary | ICD-10-CM

## 2020-01-05 LAB — RENAL FUNCTION PANEL
Albumin: 4.6 g/dL (ref 3.5–5.2)
BUN: 17 mg/dL (ref 6–23)
CO2: 26 mEq/L (ref 19–32)
Calcium: 10.1 mg/dL (ref 8.4–10.5)
Chloride: 103 mEq/L (ref 96–112)
Creatinine, Ser: 1.11 mg/dL (ref 0.40–1.20)
GFR: 47.25 mL/min — ABNORMAL LOW (ref >60.00)
Glucose, Bld: 95 mg/dL (ref 70–99)
Phosphorus: 3.4 mg/dL (ref 2.3–4.6)
Potassium: 4.7 mEq/L (ref 3.5–5.1)
Sodium: 137 mEq/L (ref 135–145)

## 2020-01-05 MED ORDER — FLUCONAZOLE 150 MG PO TABS: 150.0000 mg | ORAL_TABLET | Freq: Once | ORAL | 1 refills | Status: AC

## 2020-01-05 NOTE — Assessment & Plan Note (Signed)
Better now Putative UTI but this is not clear Finished the antibiotics and no recurrent urinary symptoms Not a big appetite but no N/V or bowel problems

## 2020-01-05 NOTE — Assessment & Plan Note (Signed)
Has decreased in size No gyn visit needed Will consider ultrasound again in 6 months or so

## 2020-01-05 NOTE — Assessment & Plan Note (Signed)
Classic symptoms of yeast after antibiotic Rx Will Rx fluocazole

## 2020-01-05 NOTE — Assessment & Plan Note (Signed)
This is better Will recheck potassium also on supplement

## 2020-01-05 NOTE — Assessment & Plan Note (Signed)
BP Readings from Last 3 Encounters:  01/05/20 120/80  12/20/19 129/64  12/14/19 (!) 163/104   Good control on amlodipine 5

## 2020-01-05 NOTE — Progress Notes (Signed)
Subjective:    Patient ID: Katie Bell, female    DOB: 1941-03-07, 79 y.o.   MRN: [id number]  HPI Here for hospital follow up This visit occurred during the SARS-CoV-2 public health emergency.  Safety protocols were in place, including screening questions prior to the visit, additional usage of staff PPE, and extensive cleaning of exam room while observing appropriate contact time as indicated for disinfecting solutions.   Got "real sick" Hurting all over---son took her to the hospital Admitted for possible sepsis Urinalysis showed 21-50 WBC---but culture not done procalcitonin was negative Given parenteral antibiotic ---then sent home on 3 days of cefdinir (she isn't sure if she took this)  No fever--"just a little bit" No cough or SOB  BP was up Started on amlodipine On potassium now  Uses 1/2 diazepam at night Only rarely uses it during the day  Reviewed CT scan of abdomen Has left ovarian cyst--that actually is redued in size---she doesn't want further attention for this  Current Outpatient Medications on File Prior to Visit  Medication Sig Dispense Refill  . amLODipine (NORVASC) 5 MG tablet TAKE 1 TABLET BY MOUTH ONCE DAILY 90 tablet 3  . Cholecalciferol (VITAMIN D) 400 UNITS capsule Take 800 Units by mouth daily.     . diazepam (VALIUM) 10 MG tablet TAKE 1/2 TO 1 TABLETS BY MOUTH 2 TIMES DAILY AS NEEDED FOR ANXIETY 30 tablet 0  . Omega-3 Fatty Acids (FISH OIL) 1000 MG CAPS Take 1,000 mg by mouth daily.     . potassium chloride SA (KLOR-CON) 20 MEQ tablet Take 1 tablet (20 mEq total) by mouth daily. (Patient taking differently: Take 10 mEq by mouth daily. ) 30 tablet 11  . vitamin C (ASCORBIC ACID) 500 MG tablet Take 500 mg by mouth daily.      No current facility-administered medications on file prior to visit.    Allergies  Allergen Reactions  . Sulfamethoxazole-Trimethoprim Swelling    Angioedema and pruritis per 01/01/2017 documentation in care everywhere  .  Estradiol Other (See Comments)    Vaginal irritation  . Morphine     agitation  . Morphine And Related Anxiety    Past Medical History:  Diagnosis Date  . Anxiety   . Cancer ([place]) 2015   basal cell removed from leg  . Depression   . Episodic mood disorder ([place])    mostly anxiety--depression in the past  . Gastric ulcer    History  . GERD (gastroesophageal reflux disease)   . History of blood transfusion    At [facility] appro 15 yrs ago  . History of hiatal hernia   . History of kidney stones 1960  . Hypertension   . Melanoma ([place]) 08/03/2014   left prox post lat thigh  . Osteoarthritis, hand    hand  . Postmenopausal disorder   . SVD (spontaneous vaginal delivery)    x 3    Past Surgical History:  Procedure Laterality Date  . CATARACT EXTRACTION W/PHACO Right 02/12/2019   Procedure: CATARACT EXTRACTION PHACO AND INTRAOCULAR LENS PLACEMENT (IOC);  Surgeon: Marchia Meiers, MD;  Location: [facility] OR[facility];  Service: Ophthalmology;  Laterality: Right;  [place] 01:34.2 CDE 17.97 Fluid Pack lot # [id number]  . CATARACT EXTRACTION W/PHACO Left 03/26/2019   Procedure: CATARACT EXTRACTION PHACO AND INTRAOCULAR LENS PLACEMENT ([place]) LEFT VISION BLUE;  Surgeon: Marchia Meiers, MD;  Location: [facility];  Service: Ophthalmology;  Laterality: Left;  [place] 01:16.2 CDE 11.70 FLUID PACK LOT # [id number]  .  CHOLECYSTECTOMY  2008  . COLONOSCOPY  04/2011  . CYSTOCELE REPAIR N/A 12/31/2013   Procedure: ANTERIOR REPAIR (CYSTOCELE);  Surgeon: Donnamae Jude, MD;  Location: [place] [facility];  Service: Gynecology;  Laterality: N/A;  . CYSTOSCOPY W/ URETERAL STENT PLACEMENT Left 01/16/2016   Procedure: CYSTOSCOPY WITH STENT REPLACEMENT;  Surgeon: Hollice Espy, MD;  Location: [facility];  Service: Urology;  Laterality: Left;  . CYSTOSCOPY WITH STENT PLACEMENT Left 12/23/2015   Procedure: CYSTOSCOPY WITH STENT PLACEMENT;  Surgeon: Ardis Hughs, MD;  Location: [facility];  Service: Urology;  Laterality: Left;  .  DIAGNOSTIC LAPAROSCOPY    . EYE SURGERY    . kidney stone removed    . MELANOMA EXCISION  2015   left posterior thigh  . TONSILLECTOMY  1944  . TUBAL LIGATION    . UPPER GI ENDOSCOPY    . URETEROSCOPY WITH HOLMIUM LASER LITHOTRIPSY Left 01/16/2016   Procedure: URETEROSCOPY WITH HOLMIUM LASER LITHOTRIPSY;  Surgeon: Hollice Espy, MD;  Location: [facility];  Service: Urology;  Laterality: Left;  . WISDOM TOOTH EXTRACTION      Family History  Problem Relation Age of Onset  . Alzheimer's disease Mother   . Alcohol abuse Father   . COPD Brother   . Heart disease Neg Hx   . Cancer Neg Hx     Social History   Socioeconomic History  . Marital status: Divorced    Spouse name: Not on file  . Number of children: 3  . Years of education: Not on file  . Highest education level: Not on file  Occupational History  . 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 Smoking status: Former Research scientist (life sciences)  . Smokeless tobacco: Never Used  Substance and Sexual Activity  . Alcohol use: No    Alcohol/week: 0.0 standard drinks  . Drug use: No  . Sexual activity: Yes    Birth control/protection: Post-menopausal  Other Topics Concern  . Not on file  Social History Narrative   3 sons--- middle one lives with her   [name]      Has living will   Son [name]. Then son [name]   Would accept resuscitation attempts but no prolonged ventilation   No tube feeds if cognitively unaware      Social Determinants of Health   Financial Resource Strain:   . Difficulty of Paying Living Expenses: Not on file  Food Insecurity:   . Worried About Charity fundraiser in the Last Year: Not on file  . Ran Out of Food in the Last Year: Not on file  Transportation Needs:   . Lack of Transportation (Medical): Not on file  . Lack of Transportation (Non-Medical): Not on file  Physical Activity:   . Days of Exercise per Week: Not on file  . Minutes of  Exercise per Session: Not on file  Stress:   . Feeling of Stress : Not on file  Social Connections:   . Frequency of Communication with Friends and Family: Not on file  . Frequency of Social Gatherings with Friends and Family: Not on file  . Attends Religious Services: Not on file  . Active Member of Clubs or Organizations: Not on file  . Attends Archivist Meetings: Not on file  . Marital Status: Not on file  Intimate Partner Violence:   . Fear of Current or Ex-Partner: Not on file  . Emotionally Abused:  Not on file  . Physically Abused: Not on file  . Sexually Abused: Not on file   Review of Systems  Has lump on left side of abdomen--wants that "looked into" Eating okay---"as much as I can" Weight stable No urinary urgency or dysuria---now with vaginal yeast infection (discussed that it can be from the antibiotic) No N/V    Objective:   Physical Exam Constitutional:      Appearance: Normal appearance.  Cardiovascular:     Rate and Rhythm: Normal rate and regular rhythm.     Heart sounds: No murmur heard.  No gallop.   Pulmonary:     Effort: Pulmonary effort is normal.     Breath sounds: Normal breath sounds. No wheezing or rales.  Abdominal:     Palpations: Abdomen is soft. There is no mass.     Tenderness: There is no abdominal tenderness.  Musculoskeletal:     Right lower leg: No edema.     Left lower leg: No edema.  Neurological:     Mental Status: She is alert.  Psychiatric:        Mood and Affect: Mood normal.        Behavior: Behavior normal.            Assessment & Plan:

## 2020-01-06 DIAGNOSIS — K219 Gastro-esophageal reflux disease without esophagitis: Secondary | ICD-10-CM | POA: Diagnosis not present

## 2020-01-06 DIAGNOSIS — E876 Hypokalemia: Secondary | ICD-10-CM | POA: Diagnosis not present

## 2020-01-06 DIAGNOSIS — N83202 Unspecified ovarian cyst, left side: Secondary | ICD-10-CM | POA: Diagnosis not present

## 2020-01-06 DIAGNOSIS — F329 Major depressive disorder, single episode, unspecified: Secondary | ICD-10-CM | POA: Diagnosis not present

## 2020-01-06 DIAGNOSIS — N390 Urinary tract infection, site not specified: Secondary | ICD-10-CM | POA: Diagnosis not present

## 2020-01-06 DIAGNOSIS — I10 Essential (primary) hypertension: Secondary | ICD-10-CM | POA: Diagnosis not present

## 2020-01-06 DIAGNOSIS — A419 Sepsis, unspecified organism: Secondary | ICD-10-CM | POA: Diagnosis not present

## 2020-01-06 DIAGNOSIS — F419 Anxiety disorder, unspecified: Secondary | ICD-10-CM | POA: Diagnosis not present

## 2020-01-07 DIAGNOSIS — A419 Sepsis, unspecified organism: Secondary | ICD-10-CM | POA: Diagnosis not present

## 2020-01-07 DIAGNOSIS — N83202 Unspecified ovarian cyst, left side: Secondary | ICD-10-CM | POA: Diagnosis not present

## 2020-01-07 DIAGNOSIS — I10 Essential (primary) hypertension: Secondary | ICD-10-CM | POA: Diagnosis not present

## 2020-01-07 DIAGNOSIS — F419 Anxiety disorder, unspecified: Secondary | ICD-10-CM | POA: Diagnosis not present

## 2020-01-07 DIAGNOSIS — E876 Hypokalemia: Secondary | ICD-10-CM | POA: Diagnosis not present

## 2020-01-07 DIAGNOSIS — F329 Major depressive disorder, single episode, unspecified: Secondary | ICD-10-CM | POA: Diagnosis not present

## 2020-01-07 DIAGNOSIS — K219 Gastro-esophageal reflux disease without esophagitis: Secondary | ICD-10-CM | POA: Diagnosis not present

## 2020-01-07 DIAGNOSIS — N390 Urinary tract infection, site not specified: Secondary | ICD-10-CM | POA: Diagnosis not present

## 2020-01-12 DIAGNOSIS — I10 Essential (primary) hypertension: Secondary | ICD-10-CM

## 2020-01-12 DIAGNOSIS — A419 Sepsis, unspecified organism: Secondary | ICD-10-CM

## 2020-01-12 DIAGNOSIS — K219 Gastro-esophageal reflux disease without esophagitis: Secondary | ICD-10-CM

## 2020-01-12 DIAGNOSIS — E876 Hypokalemia: Secondary | ICD-10-CM

## 2020-01-12 DIAGNOSIS — N83202 Unspecified ovarian cyst, left side: Secondary | ICD-10-CM

## 2020-01-12 DIAGNOSIS — F329 Major depressive disorder, single episode, unspecified: Secondary | ICD-10-CM

## 2020-01-12 DIAGNOSIS — N390 Urinary tract infection, site not specified: Secondary | ICD-10-CM

## 2020-01-12 DIAGNOSIS — F419 Anxiety disorder, unspecified: Secondary | ICD-10-CM

## 2020-01-13 ENCOUNTER — Ambulatory Visit: Payer: Medicare HMO | Admitting: Dermatology

## 2020-01-13 DIAGNOSIS — N83202 Unspecified ovarian cyst, left side: Secondary | ICD-10-CM | POA: Diagnosis not present

## 2020-01-13 DIAGNOSIS — I10 Essential (primary) hypertension: Secondary | ICD-10-CM | POA: Diagnosis not present

## 2020-01-13 DIAGNOSIS — E876 Hypokalemia: Secondary | ICD-10-CM | POA: Diagnosis not present

## 2020-01-13 DIAGNOSIS — N390 Urinary tract infection, site not specified: Secondary | ICD-10-CM | POA: Diagnosis not present

## 2020-01-13 DIAGNOSIS — A419 Sepsis, unspecified organism: Secondary | ICD-10-CM | POA: Diagnosis not present

## 2020-01-13 DIAGNOSIS — F419 Anxiety disorder, unspecified: Secondary | ICD-10-CM | POA: Diagnosis not present

## 2020-01-13 DIAGNOSIS — K219 Gastro-esophageal reflux disease without esophagitis: Secondary | ICD-10-CM | POA: Diagnosis not present

## 2020-01-13 DIAGNOSIS — F329 Major depressive disorder, single episode, unspecified: Secondary | ICD-10-CM | POA: Diagnosis not present

## 2020-01-14 ENCOUNTER — Telehealth: Payer: Self-pay | Admitting: *Deleted

## 2020-01-14 DIAGNOSIS — I10 Essential (primary) hypertension: Secondary | ICD-10-CM | POA: Diagnosis not present

## 2020-01-14 DIAGNOSIS — E876 Hypokalemia: Secondary | ICD-10-CM | POA: Diagnosis not present

## 2020-01-14 DIAGNOSIS — A419 Sepsis, unspecified organism: Secondary | ICD-10-CM | POA: Diagnosis not present

## 2020-01-14 DIAGNOSIS — K219 Gastro-esophageal reflux disease without esophagitis: Secondary | ICD-10-CM | POA: Diagnosis not present

## 2020-01-14 DIAGNOSIS — F329 Major depressive disorder, single episode, unspecified: Secondary | ICD-10-CM | POA: Diagnosis not present

## 2020-01-14 DIAGNOSIS — N390 Urinary tract infection, site not specified: Secondary | ICD-10-CM | POA: Diagnosis not present

## 2020-01-14 DIAGNOSIS — N83202 Unspecified ovarian cyst, left side: Secondary | ICD-10-CM | POA: Diagnosis not present

## 2020-01-14 DIAGNOSIS — F419 Anxiety disorder, unspecified: Secondary | ICD-10-CM | POA: Diagnosis not present

## 2020-01-14 NOTE — Telephone Encounter (Signed)
Anne[facility] Social worker called with an FYI, she did a social work eval and pt has no social work needs so they are signing off on her case. Pt has PT and nursing already set up and a good family support system so there is nothing they need to help her with.  AnneMarie CB# [phone]

## 2020-01-14 NOTE — Telephone Encounter (Signed)
Katie Bell Glad she has the support she needs

## 2020-01-15 DIAGNOSIS — F329 Major depressive disorder, single episode, unspecified: Secondary | ICD-10-CM | POA: Diagnosis not present

## 2020-01-15 DIAGNOSIS — E876 Hypokalemia: Secondary | ICD-10-CM | POA: Diagnosis not present

## 2020-01-15 DIAGNOSIS — K219 Gastro-esophageal reflux disease without esophagitis: Secondary | ICD-10-CM | POA: Diagnosis not present

## 2020-01-15 DIAGNOSIS — N83202 Unspecified ovarian cyst, left side: Secondary | ICD-10-CM | POA: Diagnosis not present

## 2020-01-15 DIAGNOSIS — N390 Urinary tract infection, site not specified: Secondary | ICD-10-CM | POA: Diagnosis not present

## 2020-01-15 DIAGNOSIS — A419 Sepsis, unspecified organism: Secondary | ICD-10-CM | POA: Diagnosis not present

## 2020-01-15 DIAGNOSIS — I10 Essential (primary) hypertension: Secondary | ICD-10-CM | POA: Diagnosis not present

## 2020-01-15 DIAGNOSIS — F419 Anxiety disorder, unspecified: Secondary | ICD-10-CM | POA: Diagnosis not present

## 2020-01-21 DIAGNOSIS — E876 Hypokalemia: Secondary | ICD-10-CM | POA: Diagnosis not present

## 2020-01-21 DIAGNOSIS — N390 Urinary tract infection, site not specified: Secondary | ICD-10-CM | POA: Diagnosis not present

## 2020-01-21 DIAGNOSIS — K219 Gastro-esophageal reflux disease without esophagitis: Secondary | ICD-10-CM | POA: Diagnosis not present

## 2020-01-21 DIAGNOSIS — A419 Sepsis, unspecified organism: Secondary | ICD-10-CM | POA: Diagnosis not present

## 2020-01-21 DIAGNOSIS — N83202 Unspecified ovarian cyst, left side: Secondary | ICD-10-CM | POA: Diagnosis not present

## 2020-01-21 DIAGNOSIS — F419 Anxiety disorder, unspecified: Secondary | ICD-10-CM | POA: Diagnosis not present

## 2020-01-21 DIAGNOSIS — F329 Major depressive disorder, single episode, unspecified: Secondary | ICD-10-CM | POA: Diagnosis not present

## 2020-01-21 DIAGNOSIS — I10 Essential (primary) hypertension: Secondary | ICD-10-CM | POA: Diagnosis not present

## 2020-01-22 DIAGNOSIS — F329 Major depressive disorder, single episode, unspecified: Secondary | ICD-10-CM | POA: Diagnosis not present

## 2020-01-22 DIAGNOSIS — F419 Anxiety disorder, unspecified: Secondary | ICD-10-CM | POA: Diagnosis not present

## 2020-01-22 DIAGNOSIS — E876 Hypokalemia: Secondary | ICD-10-CM | POA: Diagnosis not present

## 2020-01-22 DIAGNOSIS — I10 Essential (primary) hypertension: Secondary | ICD-10-CM | POA: Diagnosis not present

## 2020-01-22 DIAGNOSIS — K219 Gastro-esophageal reflux disease without esophagitis: Secondary | ICD-10-CM | POA: Diagnosis not present

## 2020-01-22 DIAGNOSIS — N390 Urinary tract infection, site not specified: Secondary | ICD-10-CM | POA: Diagnosis not present

## 2020-01-22 DIAGNOSIS — N83202 Unspecified ovarian cyst, left side: Secondary | ICD-10-CM | POA: Diagnosis not present

## 2020-01-22 DIAGNOSIS — A419 Sepsis, unspecified organism: Secondary | ICD-10-CM | POA: Diagnosis not present

## 2020-01-23 DIAGNOSIS — N390 Urinary tract infection, site not specified: Secondary | ICD-10-CM | POA: Diagnosis not present

## 2020-01-23 DIAGNOSIS — F419 Anxiety disorder, unspecified: Secondary | ICD-10-CM | POA: Diagnosis not present

## 2020-01-23 DIAGNOSIS — E876 Hypokalemia: Secondary | ICD-10-CM | POA: Diagnosis not present

## 2020-01-23 DIAGNOSIS — K219 Gastro-esophageal reflux disease without esophagitis: Secondary | ICD-10-CM | POA: Diagnosis not present

## 2020-01-23 DIAGNOSIS — A419 Sepsis, unspecified organism: Secondary | ICD-10-CM | POA: Diagnosis not present

## 2020-01-23 DIAGNOSIS — N83202 Unspecified ovarian cyst, left side: Secondary | ICD-10-CM | POA: Diagnosis not present

## 2020-01-23 DIAGNOSIS — F329 Major depressive disorder, single episode, unspecified: Secondary | ICD-10-CM | POA: Diagnosis not present

## 2020-01-23 DIAGNOSIS — I10 Essential (primary) hypertension: Secondary | ICD-10-CM | POA: Diagnosis not present

## 2020-01-27 DIAGNOSIS — N83202 Unspecified ovarian cyst, left side: Secondary | ICD-10-CM | POA: Diagnosis not present

## 2020-01-27 DIAGNOSIS — F419 Anxiety disorder, unspecified: Secondary | ICD-10-CM | POA: Diagnosis not present

## 2020-01-27 DIAGNOSIS — N390 Urinary tract infection, site not specified: Secondary | ICD-10-CM | POA: Diagnosis not present

## 2020-01-27 DIAGNOSIS — E876 Hypokalemia: Secondary | ICD-10-CM | POA: Diagnosis not present

## 2020-01-27 DIAGNOSIS — A419 Sepsis, unspecified organism: Secondary | ICD-10-CM | POA: Diagnosis not present

## 2020-01-27 DIAGNOSIS — K219 Gastro-esophageal reflux disease without esophagitis: Secondary | ICD-10-CM | POA: Diagnosis not present

## 2020-01-27 DIAGNOSIS — I10 Essential (primary) hypertension: Secondary | ICD-10-CM | POA: Diagnosis not present

## 2020-01-27 DIAGNOSIS — F329 Major depressive disorder, single episode, unspecified: Secondary | ICD-10-CM | POA: Diagnosis not present

## 2020-02-01 DIAGNOSIS — F329 Major depressive disorder, single episode, unspecified: Secondary | ICD-10-CM | POA: Diagnosis not present

## 2020-02-01 DIAGNOSIS — N390 Urinary tract infection, site not specified: Secondary | ICD-10-CM | POA: Diagnosis not present

## 2020-02-01 DIAGNOSIS — E876 Hypokalemia: Secondary | ICD-10-CM | POA: Diagnosis not present

## 2020-02-01 DIAGNOSIS — A419 Sepsis, unspecified organism: Secondary | ICD-10-CM | POA: Diagnosis not present

## 2020-02-01 DIAGNOSIS — F419 Anxiety disorder, unspecified: Secondary | ICD-10-CM | POA: Diagnosis not present

## 2020-02-01 DIAGNOSIS — N83202 Unspecified ovarian cyst, left side: Secondary | ICD-10-CM | POA: Diagnosis not present

## 2020-02-01 DIAGNOSIS — K219 Gastro-esophageal reflux disease without esophagitis: Secondary | ICD-10-CM | POA: Diagnosis not present

## 2020-02-01 DIAGNOSIS — I10 Essential (primary) hypertension: Secondary | ICD-10-CM | POA: Diagnosis not present

## 2020-02-02 DIAGNOSIS — N390 Urinary tract infection, site not specified: Secondary | ICD-10-CM | POA: Diagnosis not present

## 2020-02-02 DIAGNOSIS — E876 Hypokalemia: Secondary | ICD-10-CM | POA: Diagnosis not present

## 2020-02-02 DIAGNOSIS — A419 Sepsis, unspecified organism: Secondary | ICD-10-CM | POA: Diagnosis not present

## 2020-02-02 DIAGNOSIS — F329 Major depressive disorder, single episode, unspecified: Secondary | ICD-10-CM | POA: Diagnosis not present

## 2020-02-02 DIAGNOSIS — F419 Anxiety disorder, unspecified: Secondary | ICD-10-CM | POA: Diagnosis not present

## 2020-02-02 DIAGNOSIS — I10 Essential (primary) hypertension: Secondary | ICD-10-CM | POA: Diagnosis not present

## 2020-02-02 DIAGNOSIS — N83202 Unspecified ovarian cyst, left side: Secondary | ICD-10-CM | POA: Diagnosis not present

## 2020-02-02 DIAGNOSIS — K219 Gastro-esophageal reflux disease without esophagitis: Secondary | ICD-10-CM | POA: Diagnosis not present

## 2020-02-11 DIAGNOSIS — A419 Sepsis, unspecified organism: Secondary | ICD-10-CM | POA: Diagnosis not present

## 2020-02-11 DIAGNOSIS — N390 Urinary tract infection, site not specified: Secondary | ICD-10-CM | POA: Diagnosis not present

## 2020-02-11 DIAGNOSIS — F329 Major depressive disorder, single episode, unspecified: Secondary | ICD-10-CM | POA: Diagnosis not present

## 2020-02-11 DIAGNOSIS — N83202 Unspecified ovarian cyst, left side: Secondary | ICD-10-CM | POA: Diagnosis not present

## 2020-02-11 DIAGNOSIS — K219 Gastro-esophageal reflux disease without esophagitis: Secondary | ICD-10-CM | POA: Diagnosis not present

## 2020-02-11 DIAGNOSIS — E876 Hypokalemia: Secondary | ICD-10-CM | POA: Diagnosis not present

## 2020-02-11 DIAGNOSIS — I10 Essential (primary) hypertension: Secondary | ICD-10-CM | POA: Diagnosis not present

## 2020-02-11 DIAGNOSIS — F419 Anxiety disorder, unspecified: Secondary | ICD-10-CM | POA: Diagnosis not present

## 2020-02-12 ENCOUNTER — Other Ambulatory Visit: Payer: Self-pay | Admitting: Internal Medicine

## 2020-02-12 NOTE — Telephone Encounter (Signed)
Last filled 12-31-19 #30 Last OV 01-05-20 Next OV 04-05-20 [place]

## 2020-02-16 DIAGNOSIS — F419 Anxiety disorder, unspecified: Secondary | ICD-10-CM | POA: Diagnosis not present

## 2020-02-16 DIAGNOSIS — A419 Sepsis, unspecified organism: Secondary | ICD-10-CM | POA: Diagnosis not present

## 2020-02-16 DIAGNOSIS — K219 Gastro-esophageal reflux disease without esophagitis: Secondary | ICD-10-CM | POA: Diagnosis not present

## 2020-02-16 DIAGNOSIS — F329 Major depressive disorder, single episode, unspecified: Secondary | ICD-10-CM | POA: Diagnosis not present

## 2020-02-16 DIAGNOSIS — N83202 Unspecified ovarian cyst, left side: Secondary | ICD-10-CM | POA: Diagnosis not present

## 2020-02-16 DIAGNOSIS — E876 Hypokalemia: Secondary | ICD-10-CM | POA: Diagnosis not present

## 2020-02-16 DIAGNOSIS — N390 Urinary tract infection, site not specified: Secondary | ICD-10-CM | POA: Diagnosis not present

## 2020-02-16 DIAGNOSIS — I10 Essential (primary) hypertension: Secondary | ICD-10-CM | POA: Diagnosis not present

## 2020-03-15 ENCOUNTER — Other Ambulatory Visit: Payer: Self-pay | Admitting: Internal Medicine

## 2020-03-15 NOTE — Telephone Encounter (Signed)
Last filled 02-12-20 #30 Last OV 01-05-20 Next OV 04-05-20 [place]

## 2020-04-05 ENCOUNTER — Ambulatory Visit: Payer: Medicare HMO | Admitting: Internal Medicine

## 2020-04-05 DIAGNOSIS — Z0289 Encounter for other administrative examinations: Secondary | ICD-10-CM

## 2020-04-11 ENCOUNTER — Other Ambulatory Visit: Payer: Self-pay | Admitting: Internal Medicine

## 2020-04-11 NOTE — Telephone Encounter (Signed)
Last filled11-23-21 #30 Last OV9-14-21 No Future OV No Show 04-05-20 [facility]

## 2020-05-16 ENCOUNTER — Other Ambulatory Visit: Payer: Self-pay | Admitting: Internal Medicine

## 2020-05-16 NOTE — Telephone Encounter (Signed)
Last filled 04-11-20 #30 Last OV 01-05-20 No Future OV [place]

## 2020-05-30 ENCOUNTER — Other Ambulatory Visit: Payer: Self-pay

## 2020-05-30 ENCOUNTER — Ambulatory Visit: Payer: Medicare HMO | Admitting: Dermatology

## 2020-05-30 ENCOUNTER — Encounter: Payer: Self-pay | Admitting: Dermatology

## 2020-05-30 DIAGNOSIS — L988 Other specified disorders of the skin and subcutaneous tissue: Secondary | ICD-10-CM

## 2020-05-30 NOTE — Progress Notes (Signed)
   Follow-Up Visit   Subjective  Katie Bell is a 80 y.o. female who presents for the following: Facial Elastosis (Wrinkles of forehead - wants to discuss treatment).  The following portions of the chart were reviewed this encounter and updated as appropriate:   Tobacco  Allergies  Meds  Problems  Med Hx  Surg Hx  Fam Hx     Review of Systems:  No other skin or systemic complaints except as noted in HPI or Assessment and Plan.  Objective  Well appearing patient in no apparent distress; mood and affect are within normal limits.  A focused examination was performed including face. Relevant physical exam findings are noted in the Assessment and Plan.  Objective  Head - Anterior (Face): Rhytides and volume loss.    Assessment & Plan  Elastosis of skin Head - Anterior (Face)  Discussed Botox injections. Advised patient she will need 25 units to glabella area and treatment is every 4 months. Patient does not want treatment today but will consider it in the future.  Return for Botox.   I, Ashok Cordia, CMA, am acting as scribe for Sarina Ser, MD .  Documentation: I have reviewed the above documentation for accuracy and completeness, and I agree with the above.  Sarina Ser, MD

## 2020-05-30 NOTE — Patient Instructions (Signed)
Discussed Botox injections. Advised patient she will need 25 units to glabella area and treatment is every 4 months and the fee is $325.00 per treatment. Patient does not want treatment today but will consider it in the future.

## 2020-06-02 ENCOUNTER — Encounter: Payer: Self-pay | Admitting: Dermatology

## 2020-06-07 ENCOUNTER — Other Ambulatory Visit: Payer: Self-pay | Admitting: Internal Medicine

## 2020-06-07 NOTE — Telephone Encounter (Signed)
Last filled 05-16-20 #30 Last OV 01-05-20 No Future OV (No show 04-05-20) [place]

## 2020-06-21 ENCOUNTER — Ambulatory Visit: Payer: Medicare HMO | Admitting: Dermatology

## 2020-07-08 ENCOUNTER — Other Ambulatory Visit
Admission: RE | Admit: 2020-07-08 | Discharge: 2020-07-08 | Disposition: A | Discharge status: Home / Self Care | Payer: Medicare HMO | Source: Ambulatory Visit | Attending: Family Medicine | Admitting: Family Medicine

## 2020-07-08 ENCOUNTER — Telehealth: Payer: Self-pay

## 2020-07-08 DIAGNOSIS — R300 Dysuria: Secondary | ICD-10-CM | POA: Diagnosis not present

## 2020-07-08 DIAGNOSIS — M7989 Other specified soft tissue disorders: Secondary | ICD-10-CM | POA: Diagnosis not present

## 2020-07-08 DIAGNOSIS — R630 Anorexia: Secondary | ICD-10-CM | POA: Diagnosis not present

## 2020-07-08 DIAGNOSIS — M79604 Pain in right leg: Secondary | ICD-10-CM | POA: Diagnosis not present

## 2020-07-08 DIAGNOSIS — R209 Unspecified disturbances of skin sensation: Secondary | ICD-10-CM | POA: Diagnosis not present

## 2020-07-08 DIAGNOSIS — N390 Urinary tract infection, site not specified: Secondary | ICD-10-CM | POA: Diagnosis not present

## 2020-07-08 DIAGNOSIS — M79605 Pain in left leg: Secondary | ICD-10-CM | POA: Diagnosis not present

## 2020-07-08 LAB — D-DIMER, QUANTITATIVE: D-Dimer, Quant: 0.29 ug/mL-FEU (ref 0.00–0.50)

## 2020-07-08 NOTE — Telephone Encounter (Signed)
Spoke with patient who stated that she is currently at [facility] waiting to be seen.

## 2020-07-08 NOTE — Telephone Encounter (Signed)
Please check with her on Monday to see how she made out

## 2020-07-08 NOTE — Telephone Encounter (Signed)
Pt said starting last night she has pain (pain level now 10)in both lower legs with redness and swelling. Pt had CP earlier and pt states "can hardly get her breath." pt is dizzy where room spins around and is also lightheaded on and off. Pt does not want to go to ED and pts son declined 65 and will take his mom to [facility] [address] in [place]; sending note to DR Silvio Pate.

## 2020-07-08 NOTE — Telephone Encounter (Signed)
I do not have anything today Okay to add on Monday----or if anyone else has a slot If bad, will need to go to urgent care

## 2020-07-08 NOTE — Telephone Encounter (Signed)
[facility] Night - Client TELEPHONE ADVICE RECORD [facility] Patient Name: Katie Bell Gender: Female DOB: 1940/07/03 Age: 80 Y 10 M 14 D Return Phone Number: [phone] (Primary), [phone] (Secondary) Address: City/State/Zip: [place] [address] Client [facility] Night - Client Client Site Onset Physician Viviana Simpler- MD Contact Type Call Who Is Calling Patient / Member / Family / Caregiver Call Type Triage / Clinical Relationship To Patient Self Return Phone Number [phone] (Primary) Chief Complaint Leg Pain Reason for Call Request to Schedule Office Appointment Initial Comment Caller states she has been experiencing severe leg pain. Caller would like to scheduled an appointment. Translation No Nurse Assessment Nurse: Hardin Negus, RN, Mardene Celeste Date/Time Eilene Ghazi Time): 07/08/2020 8:06:15 AM Confirm and document reason for call. If symptomatic, describe symptoms. ---Caller states she has been experiencing severe leg pain. Caller would like to scheduled an appointment. The severe pain started last night. no injury both legs Does the patient have any new or worsening symptoms? ---Yes Will a triage be completed? ---Yes Related visit to physician within the last 2 weeks? ---No Does the PT have any chronic conditions? (i.e. diabetes, asthma, this includes High risk factors for pregnancy, etc.) ---Yes List chronic conditions. ---hypertension Is this a behavioral health or substance abuse call? ---No Guidelines Guideline Title Affirmed Question Affirmed Notes Nurse Date/Time Eilene Ghazi Time) Leg Pain Patient sounds very sick or weak to the triager Hardin Negus, RN, Mardene Celeste 07/08/2020 8:07:47 AM Disp. Time Eilene Ghazi Time) Disposition Final User 07/08/2020 8:11:19 AM Go to ED Now (or PCP triage) Yes Hardin Negus, RN, [place] Disagree/Comply Disagree Caller Understands No PLEASE NOTE:  All timestamps contained within this report are represented as [place] Standard Time. CONFIDENTIALTY NOTICE: This fax transmission is intended only for the addressee. It contains information that is legally privileged, confidential or otherwise protected from use or disclosure. If you are not the intended recipient, you are strictly prohibited from reviewing, disclosing, copying using or disseminating any of this information or taking any action in reliance on or regarding this information. If you have received this fax in error, please notify us immediately by telephone so that we can arrange for its return to [place]. Phone: [phone], Toll-Free: [phone], Fax: [phone] Page: 2 of 2 Call Id: [id number] PreDisposition Call Doctor Care Advice Given Per Guideline GO TO ED NOW (OR PCP TRIAGE): CARE ADVICE given per Leg Pain (Adult) guideline. Referrals GO TO FACILITY REFUSED

## 2020-07-11 NOTE — Telephone Encounter (Signed)
Called pt and she was very confused at the beginning of the call. She was saying she was seen here. But she was actually seen at an urgent care at [facility] on 07-08-20. Dr put her on Cefdinir for a UTI. Breathing is better. Said she had vaginal bleeding this morning but it has gotten better. Legs are getting better with elevation. I advised her if the vaginal bleeding continued, she needed to be seen.

## 2020-07-11 NOTE — Telephone Encounter (Signed)
Note reviewed in Care Everywhere Did have some confusion---not altogether new---??delirium Started on antibiotic and no mention about the index leg pain (as it was not clear on the visit)  May need to consider stopping or decreasing diazepam ?early dementia??

## 2020-07-12 ENCOUNTER — Emergency Department
Admission: EM | Admit: 2020-07-12 | Discharge: 2020-07-12 | Disposition: A | Discharge status: Home / Self Care | Payer: Medicare HMO | Attending: Emergency Medicine | Admitting: Emergency Medicine

## 2020-07-12 ENCOUNTER — Other Ambulatory Visit: Payer: Self-pay

## 2020-07-12 ENCOUNTER — Emergency Department: Payer: Medicare HMO

## 2020-07-12 DIAGNOSIS — Z87891 Personal history of nicotine dependence: Secondary | ICD-10-CM | POA: Diagnosis not present

## 2020-07-12 DIAGNOSIS — K649 Unspecified hemorrhoids: Secondary | ICD-10-CM | POA: Diagnosis not present

## 2020-07-12 DIAGNOSIS — Z85828 Personal history of other malignant neoplasm of skin: Secondary | ICD-10-CM | POA: Insufficient documentation

## 2020-07-12 DIAGNOSIS — K644 Residual hemorrhoidal skin tags: Secondary | ICD-10-CM | POA: Insufficient documentation

## 2020-07-12 DIAGNOSIS — K625 Hemorrhage of anus and rectum: Secondary | ICD-10-CM | POA: Diagnosis not present

## 2020-07-12 DIAGNOSIS — N9489 Other specified conditions associated with female genital organs and menstrual cycle: Secondary | ICD-10-CM

## 2020-07-12 DIAGNOSIS — R1110 Vomiting, unspecified: Secondary | ICD-10-CM | POA: Diagnosis not present

## 2020-07-12 DIAGNOSIS — I10 Essential (primary) hypertension: Secondary | ICD-10-CM | POA: Diagnosis not present

## 2020-07-12 DIAGNOSIS — Z9049 Acquired absence of other specified parts of digestive tract: Secondary | ICD-10-CM | POA: Insufficient documentation

## 2020-07-12 DIAGNOSIS — N838 Other noninflammatory disorders of ovary, fallopian tube and broad ligament: Secondary | ICD-10-CM | POA: Diagnosis not present

## 2020-07-12 DIAGNOSIS — Z79899 Other long term (current) drug therapy: Secondary | ICD-10-CM | POA: Insufficient documentation

## 2020-07-12 DIAGNOSIS — R1909 Other intra-abdominal and pelvic swelling, mass and lump: Secondary | ICD-10-CM | POA: Diagnosis not present

## 2020-07-12 DIAGNOSIS — R109 Unspecified abdominal pain: Secondary | ICD-10-CM | POA: Diagnosis not present

## 2020-07-12 LAB — COMPREHENSIVE METABOLIC PANEL
ALT: 13 U/L (ref 0–44)
AST: 20 U/L (ref 15–41)
Albumin: 4.7 g/dL (ref 3.5–5.0)
Alkaline Phosphatase: 49 U/L (ref 38–126)
Anion gap: 9 (ref 5–15)
BUN: 9 mg/dL (ref 8–23)
CO2: 21 mmol/L — ABNORMAL LOW (ref 22–32)
Calcium: 9.7 mg/dL (ref 8.9–10.3)
Chloride: 107 mmol/L (ref 98–111)
Creatinine, Ser: 0.88 mg/dL (ref 0.44–1.00)
GFR, Estimated: >60 mL/min (ref >60)
Glucose, Bld: 139 mg/dL — ABNORMAL HIGH (ref 70–99)
Potassium: 3.2 mmol/L — ABNORMAL LOW (ref 3.5–5.1)
Sodium: 137 mmol/L (ref 135–145)
Total Bilirubin: 0.8 mg/dL (ref 0.3–1.2)
Total Protein: 7.7 g/dL (ref 6.5–8.1)

## 2020-07-12 LAB — CBC
HCT: 43.5 % (ref 36.0–46.0)
Hemoglobin: 14.6 g/dL (ref 12.0–15.0)
MCH: 30.9 pg (ref 26.0–34.0)
MCHC: 33.6 g/dL (ref 30.0–36.0)
MCV: 92 fL (ref 80.0–100.0)
Platelets: 240 10*3/uL (ref 150–400)
RBC: 4.73 MIL/uL (ref 3.87–5.11)
RDW: 11.7 % (ref 11.5–15.5)
WBC: 7.9 10*3/uL (ref 4.0–10.5)
nRBC: 0 % (ref 0.0–0.2)

## 2020-07-12 LAB — PROTIME-INR
INR: 1 (ref 0.8–1.2)
Prothrombin Time: 12.5 seconds (ref 11.4–15.2)

## 2020-07-12 LAB — TYPE AND SCREEN
ABO/RH(D): O POS
Antibody Screen: NEGATIVE

## 2020-07-12 MED ORDER — IOHEXOL 300 MG/ML  SOLN
75.0000 mL | Freq: Once | INTRAMUSCULAR | Status: AC | PRN
  Administered 2020-07-12: 75 mL via INTRAVENOUS

## 2020-07-12 MED ORDER — DOCUSATE SODIUM 100 MG PO CAPS: 100.0000 mg | ORAL_CAPSULE | Freq: Every day | ORAL | 0 refills | Status: DC

## 2020-07-12 MED ORDER — CLOTRIMAZOLE-BETAMETHASONE 1-0.05 % EX CREA: 1.0000 "application " | TOPICAL_CREAM | Freq: Two times a day (BID) | CUTANEOUS | 0 refills | Status: DC

## 2020-07-12 MED ORDER — HYDROCORTISONE ACETATE 25 MG RE SUPP: 25.0000 mg | Freq: Two times a day (BID) | RECTAL | 0 refills | Status: AC

## 2020-07-12 NOTE — ED Notes (Signed)
Patient attempted to void on BSC, unsuccessful. No blood noted in hat or on wipes.

## 2020-07-12 NOTE — ED Provider Notes (Addendum)
[facility] Emergency Department Provider Note  ____________________________________________   Event Date/Time   First MD Initiated Contact with Patient 07/12/20 1409     (approximate)  I have reviewed the triage vital signs and the nursing notes.   HISTORY  Chief Complaint Rectal Bleeding    HPI Katie Bell is a 80 y.o. female  With h/o HTN, HLD, constipation, depression, here with rectal bleeding. Pt reports that for the past several days, Katie Bell has had drops of blood in her underwear/bed and bowel movements. Reports her stool itself has seemed normal but Katie Bell has "plops" of blood whenever Katie Bell sits, which si bright red and liquid. Denies any associated abd pain. No n/v/d, constipation. No overt melena. Reports Katie Bell has had some pain in the perirectal area as well. No fevers, chills. No recent med changes. No h/o GIB per her report, though Katie Bell has had GERD. Denies any ASA, NSAID, or blood thinner use.        Past Medical History:  Diagnosis Date  . Anxiety   . Cancer ([place]) 2015   basal cell removed from leg  . Depression   . Episodic mood disorder ([place])    mostly anxiety--depression in the past  . Gastric ulcer    History  . GERD (gastroesophageal reflux disease)   . History of blood transfusion    At [facility] appro 15 yrs ago  . History of hiatal hernia   . History of kidney stones 1960  . Hypertension   . Osteoarthritis, hand    hand  . Postmenopausal disorder   . SVD (spontaneous vaginal delivery)    x 3    Patient Active Problem List   Diagnosis Date Noted  . Vaginitis 01/05/2020  . Left ovarian cyst 01/05/2020  . Abdominal pain 12/18/2019  . Hyponatremia 01/06/2019  . Laceration of right leg excluding thigh, initial encounter 12/15/2018  . Visit for preventive health examination 02/16/2014  . Advanced directives, counseling/discussion 02/16/2014  . Female cystocele 11/02/2013  . Hypertension   . Gastric ulcer    . Episodic mood disorder ([place])   . Osteoarthritis, multiple sites     Past Surgical History:  Procedure Laterality Date  . CATARACT EXTRACTION W/PHACO Right 02/12/2019   Procedure: CATARACT EXTRACTION PHACO AND INTRAOCULAR LENS PLACEMENT (IOC);  Surgeon: Marchia Meiers, MD;  Location: [facility] ORS;  Service: Ophthalmology;  Laterality: Right;  [place] 01:34.2 CDE 17.97 Fluid Pack lot # [id number]  . CATARACT EXTRACTION W/PHACO Left 03/26/2019   Procedure: CATARACT EXTRACTION PHACO AND INTRAOCULAR LENS PLACEMENT ([place]) LEFT VISION BLUE;  Surgeon: Marchia Meiers, MD;  Location: [facility] ORS;  Service: Ophthalmology;  Laterality: Left;  [place] 01:16.2 CDE 11.70 FLUID PACK LOT # [id number]  . CHOLECYSTECTOMY  2008  . COLONOSCOPY  04/2011  . CYSTOCELE REPAIR N/A 12/31/2013   Procedure: ANTERIOR REPAIR (CYSTOCELE);  Surgeon: Donnamae Jude, MD;  Location: [facility];  Service: Gynecology;  Laterality: N/A;  . CYSTOSCOPY W/ URETERAL STENT PLACEMENT Left 01/16/2016   Procedure: CYSTOSCOPY WITH STENT REPLACEMENT;  Surgeon: Hollice Espy, MD;  Location: [facility] ORS;  Service: Urology;  Laterality: Left;  . CYSTOSCOPY WITH STENT PLACEMENT Left 12/23/2015   Procedure: CYSTOSCOPY WITH STENT PLACEMENT;  Surgeon: Ardis Hughs, MD;  Location: [facility] ORS;  Service: Urology;  Laterality: Left;  . DIAGNOSTIC LAPAROSCOPY    . EYE SURGERY    . kidney stone removed    . MELANOMA EXCISION  2015   left posterior thigh  .  TONSILLECTOMY  1944  . TUBAL LIGATION    . UPPER GI ENDOSCOPY    . URETEROSCOPY WITH HOLMIUM LASER LITHOTRIPSY Left 01/16/2016   Procedure: URETEROSCOPY WITH HOLMIUM LASER LITHOTRIPSY;  Surgeon: Hollice Espy, MD;  Location: [facility] ORS;  Service: Urology;  Laterality: Left;  . WISDOM TOOTH EXTRACTION      Prior to Admission medications   Medication Sig Start Date End Date Taking? Authorizing Provider  clotrimazole-betamethasone (LOTRISONE) cream Apply 1 application topically 2 (two) times daily. To groin and peri-anal  area rash, for 1-2 weeks (until healed). 07/12/20  Yes Duffy Bruce, MD  docusate sodium (COLACE) 100 MG capsule Take 1 capsule (100 mg total) by mouth daily. 07/12/20 08/11/20 Yes Duffy Bruce, MD  hydrocortisone (ANUSOL-HC) 25 MG suppository Place 1 suppository (25 mg total) rectally every 12 (twelve) hours for 5 days. 07/12/20 07/17/20 Yes Duffy Bruce, MD  amLODipine (NORVASC) 5 MG tablet TAKE 1 TABLET BY MOUTH ONCE DAILY 12/31/19   Venia Carbon, MD  Cholecalciferol (VITAMIN D) 400 UNITS capsule Take 800 Units by mouth daily.     [provider]  diazepam (VALIUM) 10 MG tablet TAKE 1/2 TO 1 TABLETS BY MOUTH 2 TIMES DAILY AS NEEDED FOR ANXIETY 06/07/20   Venia Carbon, MD  Omega-3 Fatty Acids (FISH OIL) 1000 MG CAPS Take 1,000 mg by mouth daily.     [provider]  potassium chloride SA (KLOR-CON) 20 MEQ tablet Take 1 tablet (20 mEq total) by mouth daily. Patient taking differently: Take 10 mEq by mouth daily.  12/16/19   Venia Carbon, MD  vitamin C (ASCORBIC ACID) 500 MG tablet Take 500 mg by mouth daily.     [provider]    Allergies Sulfamethoxazole-trimethoprim, Estradiol, Morphine, and Morphine and related  Family History  Problem Relation Age of Onset  . Alzheimer's disease Mother   . Alcohol abuse Father   . COPD Brother   . Heart disease Neg Hx   . Cancer Neg Hx     Social History Social History   Tobacco Use  . Smoking status: Former Research scientist (life sciences)  . Smokeless tobacco: Never Used  Substance Use Topics  . Alcohol use: No    Alcohol/week: 0.0 standard drinks  . Drug use: No    Review of Systems  Review of Systems  Constitutional: Positive for fatigue. Negative for chills and fever.  HENT: Negative for sore throat.   Respiratory: Negative for shortness of breath.   Cardiovascular: Negative for chest pain.  Gastrointestinal: Positive for blood in stool. Negative for abdominal pain.  Genitourinary: Negative for flank pain.   Musculoskeletal: Negative for neck pain.  Skin: Negative for rash and wound.  Allergic/Immunologic: Negative for immunocompromised state.  Neurological: Negative for weakness and numbness.  Hematological: Does not bruise/bleed easily.  All other systems reviewed and are negative.    ____________________________________________  PHYSICAL EXAM:      VITAL SIGNS: ED Triage Vitals  Enc Vitals Group     BP 07/12/20 1252 (!) 176/73     Pulse Rate 07/12/20 1252 (!) 125     Resp 07/12/20 1252 18     Temp 07/12/20 1252 98.7 F (37.1 C)     Temp Source 07/12/20 1252 Oral     SpO2 07/12/20 1252 97 %     Weight 07/12/20 1305 110 lb (49.9 kg)     Height 07/12/20 1305 5\' 2"  (1.575 m)     Head Circumference --      Peak  Flow --      Pain Score 07/12/20 1305 5     Pain Loc --      Pain Edu? --      Excl. in [place]? --      Physical Exam Vitals and nursing note reviewed.  Constitutional:      General: Katie Bell is not in acute distress.    Appearance: Katie Bell is well-developed.  HENT:     Head: Normocephalic and atraumatic.  Eyes:     Conjunctiva/sclera: Conjunctivae normal.  Cardiovascular:     Rate and Rhythm: Normal rate and regular rhythm.     Heart sounds: Normal heart sounds.  Pulmonary:     Effort: Pulmonary effort is normal. No respiratory distress.     Breath sounds: No wheezing.  Abdominal:     General: There is no distension.  Genitourinary:    Comments: Significant beefy red erythematous rash throughout perineal area, worse in skin folds. Rectal exam shows multiple swollen external hemorrhoids, one with site of recent bleeding, no large thromboses. Normal tone. Stool is brown and hemoccult negative on testing. Musculoskeletal:     Cervical back: Neck supple.  Skin:    General: Skin is warm.     Capillary Refill: Capillary refill takes less than 2 seconds.     Findings: No rash.  Neurological:     Mental Status: Katie Bell is alert and oriented to person, place, and time.      Motor: No abnormal muscle tone.       ____________________________________________   LABS (all labs ordered are listed, but only abnormal results are displayed)  Labs Reviewed  COMPREHENSIVE METABOLIC PANEL - Abnormal; Notable for the following components:      Result Value   Potassium 3.2 (*)    CO2 21 (*)    Glucose, Bld 139 (*)    All other components within normal limits  CBC  PROTIME-INR  OCCULT BLOOD X 1 CARD TO LAB, STOOL  TYPE AND SCREEN    ____________________________________________  EKG: Normal sinus rhythm, VR 82. QRS 87, QTc 483. No acute ST elevations or depressions. No ischemia or infarct. ________________________________________  RADIOLOGY All imaging, including plain films, CT scans, and ultrasounds, independently reviewed by me, and interpretations confirmed via formal radiology reads.  ED MD interpretation:   CT A/P: Interval increase in size of left adnexal soft tissue lesion, otherwise no acute abnormality  Official radiology report(s): CT ABDOMEN PELVIS W CONTRAST  Result Date: 07/12/2020 CLINICAL DATA:  Nausea and vomiting. Rectal bleeding and abdominal pain for few days. Recent bladder infection. EXAM: CT ABDOMEN AND PELVIS WITH CONTRAST TECHNIQUE: Multidetector CT imaging of the abdomen and pelvis was performed using the standard protocol following bolus administration of intravenous contrast. CONTRAST:  73mL OMNIPAQUE IOHEXOL 300 MG/ML  SOLN COMPARISON:  CT abdomen pelvis 12/18/2019, CT abdomen pelvis 12/23/2015. FINDINGS: Lower chest: 4 mm stable right middle lobe pulmonary nodule (4:1). Hepatobiliary: Vague subcentimeter hypodensity along the peripheral inferior right hepatic lobe is again noted (2:30). Otherwise no new focal hepatic lesion is seen. Status post cholecystectomy. Nonspecific trace pneumobilia (5:41). No biliary dilatation. Pancreas: No focal lesion. Normal pancreatic contour. No surrounding inflammatory changes. No main pancreatic ductal  dilatation. Spleen: Normal in size without focal abnormality. Adrenals/Urinary Tract: No adrenal nodule bilaterally. Bilateral kidneys enhance symmetrically. Redemonstration of a grossly stable in size 8.4 cm fluid density lesion arising from left kidney that likely represents a simple renal cyst. Subcentimeter hypodensities are too small to characterize. Punctate right nephrolithiasis versus  arterial calcification. No hydronephrosis. No hydroureter. The urinary bladder is unremarkable. On delayed imaging, there is no urothelial wall thickening and there are no filling defects in the opacified portions of the bilateral collecting systems or ureters. Stomach/Bowel: Stomach is within normal limits. No evidence of bowel wall thickening or dilatation. Under distended rectum. Appendix appears normal. Vascular/Lymphatic: No abdominal aorta or iliac aneurysm. Mild to moderate atherosclerotic plaque of the aorta and its branches. No abdominal, pelvic, or inguinal lymphadenopathy. Reproductive: Interval increase in size of a 3.7 x 1.8 cm (from 3.2 by 1.6 cm) left adnexal soft tissue density (2:56). Uterus and right adnexa are unremarkable. Other: No intraperitoneal free fluid. No intraperitoneal free gas. No organized fluid collection. Musculoskeletal: No abdominal wall hernia or abnormality.  Pelvic floor laxity. No suspicious lytic or blastic osseous lesions. No acute displaced fracture. Multilevel degenerative changes of the spine. IMPRESSION: 1. Interval increase in size of an indeterminate 3.7 x 1.8 cm left adnexal soft tissue density. Neoplasm is not excluded. Recommend pelvic ultrasound for further evaluation. 2. Pelvic floor laxity with under-distended rectum limits evaluation. 3. Otherwise no acute intra-abdominal or intrapelvic abnormality. Electronically Signed   By: Iven Finn M.D.   On: 07/12/2020 16:17    ____________________________________________  PROCEDURES   Procedure(s) performed (including  Critical Care):  .1-3 Lead EKG Interpretation Performed by: Duffy Bruce, MD Authorized by: Duffy Bruce, MD     Interpretation: normal     ECG rate:  70-90   ECG rate assessment: normal     Rhythm: sinus rhythm     Ectopy: none     Conduction: normal   Comments:     Indication: Weakness, bleeding    ____________________________________________  INITIAL IMPRESSION / MDM / ASSESSMENT AND PLAN / ED COURSE  As part of my medical decision making, I reviewed the following data within the [place] notes reviewed and incorporated, Old chart reviewed, Notes from prior ED visits, and [place] Controlled Substance Database       *DARALYN BERT was evaluated in Emergency Department on 07/12/2020 for the symptoms described in the history of present illness. Katie Bell was evaluated in the context of the global COVID-19 pandemic, which necessitated consideration that the patient might be at risk for infection with the SARS-CoV-2 virus that causes COVID-19. Institutional protocols and algorithms that pertain to the evaluation of patients at risk for COVID-19 are in a state of rapid change based on information released by regulatory bodies including the CDC and federal and state organizations. These policies and algorithms were followed during the patient's care in the ED.  Some ED evaluations and interventions may be delayed as a result of limited staffing during the pandemic.*     Medical Decision Making: Well-appearing 80 year old female here with reported rectal bleeding.  Story sounds like small amount of red bleeding around brown stool and exam shows bleeding hemorrhoids which I suspect are the etiology for her symptoms.  Katie Bell has a remote history of diverticulosis but her story does not fit with diverticular bleed and her stool is soft, brown, and Hemoccult negative on my examination.  Her hemoglobin is stable at her baseline.  Katie Bell is hemodynamically stable.  No evidence to  suggest ongoing significant GI bleed.  INR is 1 and Katie Bell is not on blood thinners.  Will treat with Anusol suppositories and Colace.  Patient also incidentally has what looks like intertrigo/candidal skin infection.  This could also be contributing somewhat to her discomfort.  Will give  nystatin and steroid cream with instructions to apply barrier cream until healed.  Return precautions given.  The patient also incidentally has an slight increase in left adnexal soft tissue mass.  This was discussed with the patient in detail and I have written follow-up instructions on her discharge paperwork. ____________________________________________  FINAL CLINICAL IMPRESSION(S) / ED DIAGNOSES  Final diagnoses:  Rectal bleeding  Hemorrhoids, unspecified hemorrhoid type  Adnexal mass     MEDICATIONS GIVEN DURING THIS VISIT:  Medications  iohexol (OMNIPAQUE) 300 MG/ML solution 75 mL (75 mLs Intravenous Contrast Given 07/12/20 1519)     ED Discharge Orders         Ordered    clotrimazole-betamethasone (LOTRISONE) cream  2 times daily        07/12/20 1725    hydrocortisone (ANUSOL-HC) 25 MG suppository  Every 12 hours        07/12/20 1725    docusate sodium (COLACE) 100 MG capsule  Daily        07/12/20 1725           Note:  This document was prepared using Dragon voice recognition software and may include unintentional dictation errors.   Duffy Bruce, MD 07/12/20 1731    Duffy Bruce, MD 07/12/20 2006

## 2020-07-12 NOTE — Discharge Instructions (Addendum)
For your hemorrhoids:  Use the anusol suppositories twice daily for 5-7 days Use colace stool softener daily to help keep stool soft You can try sitz baths as well Minimize straining when going to the bathroom  For your CT: - YOUR CT TODAY showed a small adnexal soft tissue mass/lesion  - This has been present for years but is slightly larger than your last scan - Radiology recommends a FOLLOW-UP PELVIC ULTRASOUND - Call Dr. Silvio Pate and discuss, ideally within the next 1-2 weeks

## 2020-07-12 NOTE — ED Notes (Signed)
Sent a rainbow,T&S, and Lactic to the lab.

## 2020-07-12 NOTE — ED Notes (Signed)
Pt ambulated to the bathroom.  

## 2020-07-12 NOTE — ED Triage Notes (Signed)
Patient arrives from home c/o rectal bleeding and abd pain ongoing x a few days. Reports was seen recently for same and recommended to come to ED if bleeding continues. Patient is confused and tearful in triage. Also reports recent bladder infection.

## 2020-07-12 NOTE — ED Notes (Signed)
Pt went to [facility] walk in with rectal bleeding on Sat. 3/19. The bleeding did not stop so they told her to come to the ER.

## 2020-07-13 ENCOUNTER — Telehealth: Payer: Self-pay

## 2020-07-13 NOTE — Telephone Encounter (Signed)
Called pt to see how she was doing after recent ER visit and to schedule her an ER F/U with Dr Silvio Pate. She said she was doing okay. Tired. I scheduled her for 07-21-20 at 2:45 because she wanted an afternoon appointment.

## 2020-07-16 ENCOUNTER — Other Ambulatory Visit: Payer: Self-pay | Admitting: Internal Medicine

## 2020-07-18 NOTE — Telephone Encounter (Signed)
Please let her know that I have cut the number because I think she is having problems with this medication and I want her to wean off it over the next couple of months

## 2020-07-18 NOTE — Telephone Encounter (Signed)
Last filled 06-07-20 #30 Last OV 01-05-20 Next OV 07-21-20 [place]

## 2020-07-19 NOTE — Telephone Encounter (Signed)
Spoke to pt. She will discuss this with Dr Silvio Pate at her OV 3-31.

## 2020-07-21 ENCOUNTER — Ambulatory Visit (INDEPENDENT_AMBULATORY_CARE_PROVIDER_SITE_OTHER): Payer: Medicare HMO | Admitting: Internal Medicine

## 2020-07-21 ENCOUNTER — Encounter: Payer: Self-pay | Admitting: Internal Medicine

## 2020-07-21 ENCOUNTER — Other Ambulatory Visit: Payer: Self-pay

## 2020-07-21 VITALS — BP 130/80 | HR 84 | Temp 98.1°F | Ht 60.0 in | Wt 104.0 lb

## 2020-07-21 DIAGNOSIS — K644 Residual hemorrhoidal skin tags: Secondary | ICD-10-CM | POA: Diagnosis not present

## 2020-07-21 DIAGNOSIS — N9489 Other specified conditions associated with female genital organs and menstrual cycle: Secondary | ICD-10-CM

## 2020-07-21 DIAGNOSIS — M79672 Pain in left foot: Secondary | ICD-10-CM | POA: Diagnosis not present

## 2020-07-21 MED ORDER — HYDROCORTISONE 2.5 % EX CREA: TOPICAL_CREAM | Freq: Three times a day (TID) | CUTANEOUS | 3 refills | Status: DC | PRN

## 2020-07-21 NOTE — Assessment & Plan Note (Signed)
Seems like mechanical pain Discussed good support shoes

## 2020-07-21 NOTE — Assessment & Plan Note (Signed)
This has stopped now No anemia Will give hydrocortisone cream for prn use

## 2020-07-21 NOTE — Assessment & Plan Note (Signed)
Has increased in size Will check ultrasound as recommended May need gyn evaluation

## 2020-07-21 NOTE — Progress Notes (Signed)
Subjective:    Patient ID: Katie Bell, female    DOB: August 02, 1940, 80 y.o.   MRN: [id number]  HPI Here for ER follow up This visit occurred during the SARS-CoV-2 public health emergency.  Safety protocols were in place, including screening questions prior to the visit, additional usage of staff PPE, and extensive cleaning of exam room while observing appropriate contact time as indicated for disinfecting solutions.   "I got sick and then had some bleeding"--almost 2 weeks ago Went to ER Found to have hemorrhoids---normal CBC CT scan showed left adnexal mass that has gotten bigger Bleeding has resolved  Now with left foot pain--especially with walking No swelling No known injury  Current Outpatient Medications on File Prior to Visit  Medication Sig Dispense Refill  . amLODipine (NORVASC) 5 MG tablet TAKE 1 TABLET BY MOUTH ONCE DAILY 90 tablet 3  . Cholecalciferol (VITAMIN D) 400 UNITS capsule Take 800 Units by mouth daily.     . clotrimazole-betamethasone (LOTRISONE) cream Apply 1 application topically 2 (two) times daily. To groin and peri-anal area rash, for 1-2 weeks (until healed). 30 g 0  . diazepam (VALIUM) 10 MG tablet TAKE 1/2 TO 1 TABLETS BY MOUTH 2 TIMES DAILY AS NEEDED FOR ANXIETY 20 tablet 0  . potassium chloride SA (KLOR-CON) 20 MEQ tablet Take 1 tablet (20 mEq total) by mouth daily. (Patient taking differently: Take 10 mEq by mouth daily.) 30 tablet 11  . vitamin C (ASCORBIC ACID) 500 MG tablet Take 500 mg by mouth daily.      No current facility-administered medications on file prior to visit.    Allergies  Allergen Reactions  . Sulfamethoxazole-Trimethoprim Swelling    Angioedema and pruritis per 01/01/2017 documentation in care everywhere  . Estradiol Other (See Comments)    Vaginal irritation Other reaction(s): Other (See Comments) Vaginal irritation  . Morphine     agitation  . Morphine And Related Anxiety    Past Medical History:  Diagnosis Date  .  Anxiety   . Cancer ([place]) 2015   basal cell removed from leg  . Depression   . Episodic mood disorder ([place])    mostly anxiety--depression in the past  . Gastric ulcer    History  . GERD (gastroesophageal reflux disease)   . History of blood transfusion    At [facility] appro 15 yrs ago  . History of hiatal hernia   . History of kidney stones 1960  . Hypertension   . Osteoarthritis, hand    hand  . Postmenopausal disorder   . SVD (spontaneous vaginal delivery)    x 3    Past Surgical History:  Procedure Laterality Date  . CATARACT EXTRACTION W/PHACO Right 02/12/2019   Procedure: CATARACT EXTRACTION PHACO AND INTRAOCULAR LENS PLACEMENT (IOC);  Surgeon: Marchia Meiers, MD;  Location: [facility];  Service: Ophthalmology;  Laterality: Right;  [place] 01:34.2 CDE 17.97 Fluid Pack lot # [id number]  . CATARACT EXTRACTION W/PHACO Left 03/26/2019   Procedure: CATARACT EXTRACTION PHACO AND INTRAOCULAR LENS PLACEMENT ([place]) LEFT VISION BLUE;  Surgeon: Marchia Meiers, MD;  Location: [facility];  Service: Ophthalmology;  Laterality: Left;  [place] 01:16.2 CDE 11.70 FLUID PACK LOT # [id number]  . CHOLECYSTECTOMY  2008  . COLONOSCOPY  04/2011  . CYSTOCELE REPAIR N/A 12/31/2013   Procedure: ANTERIOR REPAIR (CYSTOCELE);  Surgeon: Donnamae Jude, MD;  Location: [facility];  Service: Gynecology;  Laterality: N/A;  . CYSTOSCOPY W/ URETERAL STENT PLACEMENT Left 01/16/2016   Procedure: CYSTOSCOPY  WITH STENT REPLACEMENT;  Surgeon: Hollice Espy, MD;  Location: [facility];  Service: Urology;  Laterality: Left;  . CYSTOSCOPY WITH STENT PLACEMENT Left 12/23/2015   Procedure: CYSTOSCOPY WITH STENT PLACEMENT;  Surgeon: Ardis Hughs, MD;  Location: [facility];  Service: Urology;  Laterality: Left;  . DIAGNOSTIC LAPAROSCOPY    . EYE SURGERY    . kidney stone removed    . MELANOMA EXCISION  2015   left posterior thigh  . TONSILLECTOMY  1944  . TUBAL LIGATION    . UPPER GI ENDOSCOPY    . URETEROSCOPY WITH HOLMIUM  LASER LITHOTRIPSY Left 01/16/2016   Procedure: URETEROSCOPY WITH HOLMIUM LASER LITHOTRIPSY;  Surgeon: Hollice Espy, MD;  Location: [facility];  Service: Urology;  Laterality: Left;  . WISDOM TOOTH EXTRACTION      Family History  Problem Relation Age of Onset  . Alzheimer's disease Mother   . Alcohol abuse Father   . COPD Brother   . Heart disease Neg Hx   . Cancer Neg Hx     Social History   Socioeconomic History  . Marital status: Divorced    Spouse name: Not on file  . Number of children: 3  . Years of education: Not on file  . Highest education level: Not on file  Occupational History  . 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 Smoking status: Former Research scientist (life sciences)  . Smokeless tobacco: Never Used  Substance and Sexual Activity  . Alcohol use: No    Alcohol/week: 0.0 standard drinks  . Drug use: No  . Sexual activity: Yes    Birth control/protection: Post-menopausal  Other Topics Concern  . Not on file  Social History Narrative   3 sons--- middle one lives with her   [name]      Has living will   Son [name]. Then son [name]   Would accept resuscitation attempts but no prolonged ventilation   No tube feeds if cognitively unaware      Social Determinants of Health   Financial Resource Strain: Not on file  Food Insecurity: Not on file  Transportation Needs: Not on file  Physical Activity: Not on file  Stress: Not on file  Social Connections: Not on file  Intimate Partner Violence: Not on file   Review of Systems Eating okay Has lost some weight No dysuria now    Objective:   Physical Exam Constitutional:      Appearance: Normal appearance.  Musculoskeletal:     Comments: No edema Left foot---no swelling, tenderness. ROM is normal (points to distal 4th metatarsal)  Neurological:     Mental Status: She is alert.     Comments: Repeats herself some Vague and not able to clearly answer  many questions            Assessment & Plan:

## 2020-08-03 ENCOUNTER — Ambulatory Visit: Payer: Medicare HMO | Admitting: Dermatology

## 2020-08-15 ENCOUNTER — Other Ambulatory Visit: Payer: Self-pay | Admitting: Internal Medicine

## 2020-08-15 NOTE — Telephone Encounter (Signed)
Last filled 07-18-20 #20 Last OV 07-21-20 No Future [place]

## 2020-08-18 ENCOUNTER — Other Ambulatory Visit: Payer: Self-pay

## 2020-08-18 ENCOUNTER — Ambulatory Visit
Admission: RE | Admit: 2020-08-18 | Discharge: 2020-08-18 | Disposition: A | Discharge status: Home / Self Care | Payer: Medicare HMO | Source: Ambulatory Visit | Attending: Internal Medicine | Admitting: Internal Medicine

## 2020-08-18 DIAGNOSIS — N9489 Other specified conditions associated with female genital organs and menstrual cycle: Secondary | ICD-10-CM | POA: Insufficient documentation

## 2020-08-18 DIAGNOSIS — R1909 Other intra-abdominal and pelvic swelling, mass and lump: Secondary | ICD-10-CM | POA: Diagnosis not present

## 2020-08-24 DIAGNOSIS — Z0100 Encounter for examination of eyes and vision without abnormal findings: Secondary | ICD-10-CM | POA: Diagnosis not present

## 2020-08-24 DIAGNOSIS — H26493 Other secondary cataract, bilateral: Secondary | ICD-10-CM | POA: Diagnosis not present

## 2020-09-10 ENCOUNTER — Other Ambulatory Visit: Payer: Self-pay | Admitting: Internal Medicine

## 2020-09-13 NOTE — Telephone Encounter (Signed)
LOV 07/21/20 acute visit No future appointments.  Last filled on 08/15/20 #15 0 refill [place]

## 2020-09-14 ENCOUNTER — Encounter: Payer: Self-pay | Admitting: Emergency Medicine

## 2020-09-14 ENCOUNTER — Emergency Department
Admission: EM | Admit: 2020-09-14 | Discharge: 2020-09-14 | Disposition: A | Discharge status: Home / Self Care | Payer: Medicare HMO | Attending: Emergency Medicine | Admitting: Emergency Medicine

## 2020-09-14 ENCOUNTER — Other Ambulatory Visit: Payer: Self-pay

## 2020-09-14 DIAGNOSIS — B9689 Other specified bacterial agents as the cause of diseases classified elsewhere: Secondary | ICD-10-CM | POA: Diagnosis not present

## 2020-09-14 DIAGNOSIS — Z87891 Personal history of nicotine dependence: Secondary | ICD-10-CM | POA: Diagnosis not present

## 2020-09-14 DIAGNOSIS — N3000 Acute cystitis without hematuria: Secondary | ICD-10-CM | POA: Insufficient documentation

## 2020-09-14 DIAGNOSIS — I10 Essential (primary) hypertension: Secondary | ICD-10-CM | POA: Diagnosis not present

## 2020-09-14 DIAGNOSIS — Z85830 Personal history of malignant neoplasm of bone: Secondary | ICD-10-CM | POA: Diagnosis not present

## 2020-09-14 DIAGNOSIS — R300 Dysuria: Secondary | ICD-10-CM | POA: Diagnosis not present

## 2020-09-14 LAB — URINALYSIS, COMPLETE (UACMP) WITH MICROSCOPIC
Bilirubin Urine: NEGATIVE
Glucose, UA: NEGATIVE mg/dL
Hgb urine dipstick: NEGATIVE
Ketones, ur: NEGATIVE mg/dL
Nitrite: NEGATIVE
Protein, ur: NEGATIVE mg/dL
Specific Gravity, Urine: 1.002 — ABNORMAL LOW (ref 1.005–1.030)
Squamous Epithelial / HPF: NONE SEEN (ref 0–5)
pH: 7 (ref 5.0–8.0)

## 2020-09-14 MED ORDER — CEFTRIAXONE SODIUM 1 G IJ SOLR
1.0000 g | Freq: Once | INTRAMUSCULAR | Status: AC
  Administered 2020-09-14: 1 g via INTRAMUSCULAR
  Filled 2020-09-14: qty 10

## 2020-09-14 MED ORDER — CEPHALEXIN 500 MG PO CAPS: 500.0000 mg | ORAL_CAPSULE | Freq: Four times a day (QID) | ORAL | 0 refills | Status: AC

## 2020-09-14 MED ORDER — LIDOCAINE HCL (PF) 1 % IJ SOLN
INTRAMUSCULAR | Status: AC
  Filled 2020-09-14: qty 5

## 2020-09-14 NOTE — ED Notes (Signed)
Pt assisted calling son for ride.

## 2020-09-14 NOTE — ED Triage Notes (Signed)
C/O urinary frequency today.  AAOx3.  Skin warm and dry. NAD

## 2020-09-14 NOTE — Discharge Instructions (Addendum)
Take Keflex four times daily for the next seven days.  

## 2020-09-15 NOTE — ED Provider Notes (Signed)
[facility]  ____________________________________________  Time seen: Approximately 7:03 PM  I have reviewed the triage vital signs and the nursing notes.   HISTORY  Chief Complaint Dysuria   Historian Patient     HPI Katie Bell is a 80 y.o. female presents to the emergency department with dysuria and urinary frequency that started today.  Patient reports that she is prone to urinary tract infections and that her current symptoms feel similar.  She denies low back pain, nausea, vomiting or abdominal pain.  No chest pain or chest tightness.  No fever at home.    Past Medical History:  Diagnosis Date  . Anxiety   . Cancer ([place]) 2015   basal cell removed from leg  . Depression   . Episodic mood disorder ([place])    mostly anxiety--depression in the past  . Gastric ulcer    History  . GERD (gastroesophageal reflux disease)   . History of blood transfusion    At [facility] appro 15 yrs ago  . History of hiatal hernia   . History of kidney stones 1960  . Hypertension   . Osteoarthritis, hand    hand  . Postmenopausal disorder   . SVD (spontaneous vaginal delivery)    x 3     Immunizations up to date:  Yes.     Past Medical History:  Diagnosis Date  . Anxiety   . Cancer ([place]) 2015   basal cell removed from leg  . Depression   . Episodic mood disorder ([place])    mostly anxiety--depression in the past  . Gastric ulcer    History  . GERD (gastroesophageal reflux disease)   . History of blood transfusion    At [facility] appro 15 yrs ago  . History of hiatal hernia   . History of kidney stones 1960  . Hypertension   . Osteoarthritis, hand    hand  . Postmenopausal disorder   . SVD (spontaneous vaginal delivery)    x 3    Patient Active Problem List   Diagnosis Date Noted  . External hemorrhoid, bleeding 07/21/2020  . Left foot pain 07/21/2020  . Adnexal mass 07/21/2020  . Vaginitis 01/05/2020  . Left  ovarian cyst 01/05/2020  . Abdominal pain 12/18/2019  . Hyponatremia 01/06/2019  . Laceration of right leg excluding thigh, initial encounter 12/15/2018  . Visit for preventive health examination 02/16/2014  . Advanced directives, counseling/discussion 02/16/2014  . Female cystocele 11/02/2013  . Hypertension   . Gastric ulcer   . Episodic mood disorder ([place])   . Osteoarthritis, multiple sites     Past Surgical History:  Procedure Laterality Date  . CATARACT EXTRACTION W/PHACO Right 02/12/2019   Procedure: CATARACT EXTRACTION PHACO AND INTRAOCULAR LENS PLACEMENT (IOC);  Surgeon: Marchia Meiers, MD;  Location: [facility];  Service: Ophthalmology;  Laterality: Right;  [place] 01:34.2 CDE 17.97 Fluid Pack lot # [id number]  . CATARACT EXTRACTION W/PHACO Left 03/26/2019   Procedure: CATARACT EXTRACTION PHACO AND INTRAOCULAR LENS PLACEMENT ([place]) LEFT VISION BLUE;  Surgeon: Marchia Meiers, MD;  Location: [facility];  Service: Ophthalmology;  Laterality: Left;  [place] 01:16.2 CDE 11.70 FLUID PACK LOT # [id number]  . CHOLECYSTECTOMY  2008  . COLONOSCOPY  04/2011  . CYSTOCELE REPAIR N/A 12/31/2013   Procedure: ANTERIOR REPAIR (CYSTOCELE);  Surgeon: Donnamae Jude, MD;  Location: [facility];  Service: Gynecology;  Laterality: N/A;  . CYSTOSCOPY W/ URETERAL STENT PLACEMENT Left 01/16/2016   Procedure: CYSTOSCOPY WITH STENT REPLACEMENT;  Surgeon: Hollice Espy, MD;  Location: [facility];  Service: Urology;  Laterality: Left;  . CYSTOSCOPY WITH STENT PLACEMENT Left 12/23/2015   Procedure: CYSTOSCOPY WITH STENT PLACEMENT;  Surgeon: Ardis Hughs, MD;  Location: [facility];  Service: Urology;  Laterality: Left;  . DIAGNOSTIC LAPAROSCOPY    . EYE SURGERY    . kidney stone removed    . MELANOMA EXCISION  2015   left posterior thigh  . TONSILLECTOMY  1944  . TUBAL LIGATION    . UPPER GI ENDOSCOPY    . URETEROSCOPY WITH HOLMIUM LASER LITHOTRIPSY Left 01/16/2016   Procedure: URETEROSCOPY WITH HOLMIUM LASER LITHOTRIPSY;   Surgeon: Hollice Espy, MD;  Location: [facility];  Service: Urology;  Laterality: Left;  . WISDOM TOOTH EXTRACTION      Prior to Admission medications   Medication Sig Start Date End Date Taking? Authorizing Provider  cephALEXin (KEFLEX) 500 MG capsule Take 1 capsule (500 mg total) by mouth 4 (four) times daily for 7 days. 09/14/20 09/21/20 Yes Vallarie Mare M, PA-C  amLODipine (NORVASC) 5 MG tablet TAKE 1 TABLET BY MOUTH ONCE DAILY 12/31/19   Venia Carbon, MD  Cholecalciferol (VITAMIN D) 400 UNITS capsule Take 800 Units by mouth daily.     [provider]  clotrimazole-betamethasone (LOTRISONE) cream Apply 1 application topically 2 (two) times daily. To groin and peri-anal area rash, for 1-2 weeks (until healed). 07/12/20   Duffy Bruce, MD  diazepam (VALIUM) 10 MG tablet TAKE 1/2 TO 1 TABLETS BY MOUTH 2 TIMES DAILY AS NEEDED FOR ANXIETY 09/14/20   Viviana Simpler I, MD  hydrocortisone 2.5 % cream Apply topically 3 (three) times daily as needed. To hemorrhoids 07/21/20   Viviana Simpler I, MD  potassium chloride SA (KLOR-CON) 20 MEQ tablet Take 1 tablet (20 mEq total) by mouth daily. Patient taking differently: Take 10 mEq by mouth daily. 12/16/19   Venia Carbon, MD  vitamin C (ASCORBIC ACID) 500 MG tablet Take 500 mg by mouth daily.     [provider]    Allergies Sulfamethoxazole-trimethoprim, Estradiol, Morphine, and Morphine and related  Family History  Problem Relation Age of Onset  . Alzheimer's disease Mother   . Alcohol abuse Father   . COPD Brother   . Heart disease Neg Hx   . Cancer Neg Hx     Social History Social History   Tobacco Use  . Smoking status: Former Research scientist (life sciences)  . Smokeless tobacco: Never Used  Substance Use Topics  . Alcohol use: No    Alcohol/week: 0.0 standard drinks  . Drug use: No     Review of Systems  Constitutional: No fever/chills Eyes:  No discharge ENT: No upper respiratory complaints. Respiratory: no cough. No SOB/  use of accessory muscles to breath Gastrointestinal:   No nausea, no vomiting.  No diarrhea.  No constipation. Genitourinary: Patient has dysuria and increased urinary frequency.  Musculoskeletal: Negative for musculoskeletal pain. Skin: Negative for rash, abrasions, lacerations, ecchymosis.    ____________________________________________   PHYSICAL EXAM:  VITAL SIGNS: ED Triage Vitals  Enc Vitals Group     BP 09/14/20 1817 (!) 160/79     Pulse Rate 09/14/20 1817 93     Resp 09/14/20 1817 17     Temp 09/14/20 1819 97.9 F (36.6 C)     Temp Source 09/14/20 1819 Oral     SpO2 09/14/20 1817 97 %     Weight 09/14/20 1815 104 lb 0.9 oz (47.2 kg)  Height 09/14/20 1815 5\' 2"  (1.575 m)     Head Circumference --      Peak Flow --      Pain Score 09/14/20 1815 0     Pain Loc --      Pain Edu? --      Excl. in [place]? --      Constitutional: Alert and oriented. Well appearing and in no acute distress. Eyes: Conjunctivae are normal. PERRL. EOMI. Head: Atraumatic. ENT:      Nose: No congestion/rhinnorhea.      Mouth/Throat: Mucous membranes are moist.  Neck: No stridor.  No cervical spine tenderness to palpation. Cardiovascular: Normal rate, regular rhythm. Normal S1 and S2.  Good peripheral circulation. Respiratory: Normal respiratory effort without tachypnea or retractions. Lungs CTAB. Good air entry to the bases with no decreased or absent breath sounds Gastrointestinal: Bowel sounds x 4 quadrants. Soft and nontender to palpation. No guarding or rigidity. No distention. Musculoskeletal: Full range of motion to all extremities. No obvious deformities noted Neurologic:  Normal for age. No gross focal neurologic deficits are appreciated.  Skin:  Skin is warm, dry and intact. No rash noted. Psychiatric: Mood and affect are normal for age. Speech and behavior are normal.   ____________________________________________   LABS (all labs ordered are listed, but only abnormal results  are displayed)  Labs Reviewed  URINALYSIS, COMPLETE (UACMP) WITH MICROSCOPIC - Abnormal; Notable for the following components:      Result Value   Color, Urine STRAW (*)    APPearance CLEAR (*)    Specific Gravity, Urine 1.002 (*)    Leukocytes,Ua MODERATE (*)    Bacteria, UA MANY (*)    All other components within normal limits  URINE CULTURE   ____________________________________________  EKG   ____________________________________________  RADIOLOGY   No results found.  ____________________________________________    PROCEDURES  Procedure(s) performed:     Procedures     Medications  cefTRIAXone (ROCEPHIN) injection 1 g (1 g Intramuscular Given 09/14/20 2104)     ____________________________________________   INITIAL IMPRESSION / ASSESSMENT AND PLAN / ED COURSE  Pertinent labs & imaging results that were available during my care of the patient were reviewed by me and considered in my medical decision making (see chart for details).      Assessment and plan Dysuria Cystitis 80 year old female presents to the emergency department with dysuria and increased urinary frequency that started today.  No hematuria or flank pain to suggest nephrolithiasis.  Patient denies chills, fever, nausea, vomiting or abdominal discomfort that would increase suspicion for pyelonephritis.  Patient was discharged with Keflex 4 times daily for 7 days.  All patient questions were answered.     ____________________________________________  FINAL CLINICAL IMPRESSION(S) / ED DIAGNOSES  Final diagnoses:  Acute cystitis without hematuria      NEW MEDICATIONS STARTED DURING THIS VISIT:  ED Discharge Orders         Ordered    cephALEXin (KEFLEX) 500 MG capsule  4 times daily        09/14/20 2050              This chart was dictated using voice recognition software/Dragon. Despite best efforts to proofread, errors can occur which can change the meaning. Any  change was purely unintentional.     Lannie Fields, PA-C 09/15/20 1907    Naaman Plummer, MD 09/15/20 [phone]

## 2020-09-17 LAB — URINE CULTURE: Culture: >=100000 — AB

## 2020-09-20 ENCOUNTER — Encounter: Payer: Self-pay | Admitting: Internal Medicine

## 2020-09-20 ENCOUNTER — Ambulatory Visit: Payer: Medicare HMO | Admitting: Internal Medicine

## 2020-09-20 ENCOUNTER — Other Ambulatory Visit: Payer: Self-pay

## 2020-09-20 DIAGNOSIS — N3000 Acute cystitis without hematuria: Secondary | ICD-10-CM

## 2020-09-20 NOTE — Progress Notes (Signed)
Subjective:    Patient ID: Katie Bell, female    DOB: 1940/07/30, 80 y.o.   MRN: [id number]  HPI Here for ER follow up This visit occurred during the SARS-CoV-2 public health emergency.  Safety protocols were in place, including screening questions prior to the visit, additional usage of staff PPE, and extensive cleaning of exam room while observing appropriate contact time as indicated for disinfecting solutions.   Developed urinary symptoms Had urgency--going "every minute" but couldn't go Had lower abdominal pain No clear dysuria No hematuria No fever Was reluctant to go to the ER but felt too much in distress to wait  Got injection in ER Now on cephalexin  4/day  Symptoms are better now Mostly concerned about recurrence  Current Outpatient Medications on File Prior to Visit  Medication Sig Dispense Refill  . amLODipine (NORVASC) 5 MG tablet TAKE 1 TABLET BY MOUTH ONCE DAILY 90 tablet 3  . cephALEXin (KEFLEX) 500 MG capsule Take 1 capsule (500 mg total) by mouth 4 (four) times daily for 7 days. 28 capsule 0  . Cholecalciferol (VITAMIN D) 400 UNITS capsule Take 800 Units by mouth daily.     . clotrimazole-betamethasone (LOTRISONE) cream Apply 1 application topically 2 (two) times daily. To groin and peri-anal area rash, for 1-2 weeks (until healed). 30 g 0  . diazepam (VALIUM) 10 MG tablet TAKE 1/2 TO 1 TABLETS BY MOUTH 2 TIMES DAILY AS NEEDED FOR ANXIETY 15 tablet 0  . hydrocortisone 2.5 % cream Apply topically 3 (three) times daily as needed. To hemorrhoids 28 g 3  . vitamin C (ASCORBIC ACID) 500 MG tablet Take 500 mg by mouth daily.     . potassium chloride SA (KLOR-CON) 20 MEQ tablet Take 1 tablet (20 mEq total) by mouth daily. (Patient not taking: Reported on 09/20/2020) 30 tablet 11   No current facility-administered medications on file prior to visit.    Allergies  Allergen Reactions  . Sulfamethoxazole-Trimethoprim Swelling    Angioedema and pruritis per  01/01/2017 documentation in care everywhere  . Estradiol Other (See Comments)    Vaginal irritation Other reaction(s): Other (See Comments) Vaginal irritation  . Morphine     agitation  . Morphine And Related Anxiety    Past Medical History:  Diagnosis Date  . Anxiety   . Cancer ([place]) 2015   basal cell removed from leg  . Depression   . Episodic mood disorder ([place])    mostly anxiety--depression in the past  . Gastric ulcer    History  . GERD (gastroesophageal reflux disease)   . History of blood transfusion    At [facility] appro [facility] yrs ago  . History of hiatal hernia   . History of kidney stones 1960  . Hypertension   . Osteoarthritis, hand    hand  . Postmenopausal disorder   . SVD (spontaneous vaginal delivery)    x 3    Past Surgical History:  Procedure Laterality Date  . CATARACT EXTRACTION W/PHACO Right 02/12/2019   Procedure: CATARACT EXTRACTION PHACO AND INTRAOCULAR LENS PLACEMENT (IOC);  Surgeon: Marchia Meiers, MD;  Location: [facility] OR[facility];  Service: Ophthalmology;  Laterality: Right;  [place] 01:34.2 CDE 17.97 Fluid Pack lot # [id number]  . CATARACT EXTRACTION W/PHACO Left 03/26/2019   Procedure: CATARACT EXTRACTION PHACO AND INTRAOCULAR LENS PLACEMENT ([place]) LEFT VISION BLUE;  Surgeon: Marchia Meiers, MD;  Location: [facility];  Service: Ophthalmology;  Laterality: Left;  [place] 01:16.2 CDE 11.70 FLUID PACK LOT # [id number]  .  CHOLECYSTECTOMY  2008  . COLONOSCOPY  04/2011  . CYSTOCELE REPAIR N/A 12/31/2013   Procedure: ANTERIOR REPAIR (CYSTOCELE);  Surgeon: Donnamae Jude, MD;  Location: [place] [facility];  Service: Gynecology;  Laterality: N/A;  . CYSTOSCOPY W/ URETERAL STENT PLACEMENT Left 01/16/2016   Procedure: CYSTOSCOPY WITH STENT REPLACEMENT;  Surgeon: Hollice Espy, MD;  Location: [facility];  Service: Urology;  Laterality: Left;  . CYSTOSCOPY WITH STENT PLACEMENT Left 12/23/2015   Procedure: CYSTOSCOPY WITH STENT PLACEMENT;  Surgeon: Ardis Hughs, MD;  Location: [facility]  ORS;  Service: Urology;  Laterality: Left;  . DIAGNOSTIC LAPAROSCOPY    . EYE SURGERY    . kidney stone removed    . MELANOMA EXCISION  2015   left posterior thigh  . TONSILLECTOMY  1944  . TUBAL LIGATION    . UPPER GI ENDOSCOPY    . URETEROSCOPY WITH HOLMIUM LASER LITHOTRIPSY Left 01/16/2016   Procedure: URETEROSCOPY WITH HOLMIUM LASER LITHOTRIPSY;  Surgeon: Hollice Espy, MD;  Location: [facility]S;  Service: Urology;  Laterality: Left;  . WISDOM TOOTH EXTRACTION      Family History  Problem Relation Age of Onset  . Alzheimer's disease Mother   . Alcohol abuse Father   . COPD Brother   . Heart disease Neg Hx   . Cancer Neg Hx     Social History   Socioeconomic History  . Marital status: Divorced    Spouse name: Not on file  . Number of children: 3  . Years of education: Not on file  . Highest education level: Not on file  Occupational History  . 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 Smoking status: Former Research scientist (life sciences)  . Smokeless tobacco: Never Used  Substance and Sexual Activity  . Alcohol use: No    Alcohol/week: 0.0 standard drinks  . Drug use: No  . Sexual activity: Yes    Birth control/protection: Post-menopausal  Other Topics Concern  . Not on file  Social History Narrative   3 sons--- middle one lives with her   Goes by Katie Bell      Has living will   Son Katie Bell should make decisions for her. Then son Katie Bell   Would accept resuscitation attempts but no prolonged ventilation   No tube feeds if cognitively unaware      Social Determinants of Health   Financial Resource Strain: Not on file  Food Insecurity: Not on file  Transportation Needs: Not on file  Physical Activity: Not on file  Stress: Not on file  Social Connections: Not on file  Intimate Partner Violence: Not on file   Review of Systems Not eating great No vaginal products No recent sex    Objective:   Physical Exam Abdominal:     Palpations: Abdomen  is soft.     Tenderness: There is no right CVA tenderness or left CVA tenderness.     Comments: Very slight suprapubic tenderness            Assessment & Plan:

## 2020-09-20 NOTE — Patient Instructions (Addendum)
Please start a daily cranberry tablet or a glass of cranberry juice daily.

## 2020-09-20 NOTE — Assessment & Plan Note (Signed)
She had an infection ---pansensitive except ampicillin Symptoms pretty much gone Will finish out the cephalexin Discussed starting daily cranberry---she is really scared due to the severity of the pain this time and it happened at night Could consider vaginal estrogen--but only the 3rd documented infection since 2018

## 2020-09-22 ENCOUNTER — Telehealth: Payer: Self-pay | Admitting: Internal Medicine

## 2020-09-22 NOTE — Telephone Encounter (Signed)
Patient was seen yesterday. Katie Bell collected her 20.00 copay. Patient had brought in her old expired card. According to the new insurance she had a 20.00 copay. Explained multiple times to her that she had provided her old card. Patient states that she spoke to her insurance company and they told her she  should not have been charged a 20.00 copay. Patient expressed she had never been billed before. Please call her back to discuss.

## 2020-09-22 NOTE — Telephone Encounter (Signed)
Called patient to discuss. She stated she could not hear well because she was in a restaurant. I asked her if she would be able to call me back when she was finished and in a place where she could hear me. She asked why she would need to call back. I advised her it was regarding her copay from yesterday. She continued the conversation and asked if I was able to see that she did not owe the copay. I advised her that the system is coming back with a $20 copay due at the time of service, but that I would process the refund request and take a look further into her benefits. She stated that we owed her this money and that she will be sending a letter then hung up. I will investigate this further and reach back out to patient once I receive confirmation on the copay due.

## 2020-10-25 ENCOUNTER — Telehealth: Payer: Self-pay

## 2020-10-25 NOTE — Telephone Encounter (Signed)
[place] Night - Client Nonclinical Telephone Record  [facility] Client [place] Night - Client Client Site [place] - Night Contact Type Call Who Is Calling Patient / Member / Family / Caregiver Caller Name Latifa Noble Phone Number [phone] Patient Name Katie Bell Patient DOB 10/08/1940 Call Type Message Only Information Provided Reason for Call Request to Schedule Office Appointment Initial Comment Caller would like an appointment. She has felt sick for a couple of days. Patient request to speak to RN No Disp. Time Disposition Final User 10/25/2020 7:59:11 AM General Information Provided Yes Gerhard Perches Call Closed By: Gerhard Perches Transaction Date/Time: 10/25/2020 7:54:49 AM (ET)

## 2020-10-25 NOTE — Telephone Encounter (Signed)
LVM. Need to triage pt.

## 2020-10-26 ENCOUNTER — Encounter: Payer: Self-pay | Admitting: Internal Medicine

## 2020-10-26 ENCOUNTER — Ambulatory Visit (INDEPENDENT_AMBULATORY_CARE_PROVIDER_SITE_OTHER): Payer: Medicare HMO | Admitting: Internal Medicine

## 2020-10-26 ENCOUNTER — Other Ambulatory Visit: Payer: Self-pay

## 2020-10-26 DIAGNOSIS — E441 Mild protein-calorie malnutrition: Secondary | ICD-10-CM | POA: Insufficient documentation

## 2020-10-26 DIAGNOSIS — F39 Unspecified mood [affective] disorder: Secondary | ICD-10-CM | POA: Diagnosis not present

## 2020-10-26 MED ORDER — SERTRALINE HCL 50 MG PO TABS: 50.0000 mg | ORAL_TABLET | Freq: Every day | ORAL | 3 refills | Status: DC

## 2020-10-26 NOTE — Assessment & Plan Note (Signed)
Anxiety is flared now--off the diazepam Risks with any treatment--discussed options Discussed suicidality (but mostly anxiety--not as much depression) Will try low dose sertraline Other options duloxetine If fails, may want to go back to a shorter acting benzo

## 2020-10-26 NOTE — Progress Notes (Signed)
Subjective:    Patient ID: Katie Bell, female    DOB: 1940/06/19, 80 y.o.   MRN: [id number]  HPI Here due to anxiety This visit occurred during the SARS-CoV-2 public health emergency.  Safety protocols were in place, including screening questions prior to the visit, additional usage of staff PPE, and extensive cleaning of exam room while observing appropriate contact time as indicated for disinfecting solutions.   Having anxiety Gets sense "that my heart is jumping out" Will be pounding Especially bad in the morning Has some shaking---and "teeth will jerk" when eating at times  Some depression  Current Outpatient Medications on File Prior to Visit  Medication Sig Dispense Refill   amLODipine (NORVASC) 5 MG tablet TAKE 1 TABLET BY MOUTH ONCE DAILY 90 tablet 3   potassium chloride SA (KLOR-CON) 20 MEQ tablet Take 1 tablet (20 mEq total) by mouth daily. 30 tablet 11   Cholecalciferol (VITAMIN D) 400 UNITS capsule Take 800 Units by mouth daily.      clotrimazole-betamethasone (LOTRISONE) cream Apply 1 application topically 2 (two) times daily. To groin and peri-anal area rash, for 1-2 weeks (until healed). 30 g 0   hydrocortisone 2.5 % cream Apply topically 3 (three) times daily as needed. To hemorrhoids (Patient not taking: Reported on 10/26/2020) 28 g 3   No current facility-administered medications on file prior to visit.    Allergies  Allergen Reactions   Sulfamethoxazole-Trimethoprim Swelling    Angioedema and pruritis per 01/01/2017 documentation in care everywhere   Estradiol Other (See Comments)    Vaginal irritation Other reaction(s): Other (See Comments) Vaginal irritation   Morphine     agitation   Morphine And Related Anxiety    Past Medical History:  Diagnosis Date   Anxiety    Cancer ([place]) 2015   basal cell removed from leg   Depression    Episodic mood disorder ([place])    mostly anxiety--depression in the past   Gastric ulcer    History   GERD  (gastroesophageal reflux disease)    History of blood transfusion    At [facility] appro 15 yrs ago   History of hiatal hernia    History of kidney stones 1960   Hypertension    Osteoarthritis, hand    hand   Postmenopausal disorder    SVD (spontaneous vaginal delivery)    x 3    Past Surgical History:  Procedure Laterality Date   CATARACT EXTRACTION W/PHACO Right 02/12/2019   Procedure: CATARACT EXTRACTION PHACO AND INTRAOCULAR LENS PLACEMENT ([place]);  Surgeon: Marchia Meiers, MD;  Location: [facility];  Service: Ophthalmology;  Laterality: Right;  [place] 01:34.2 CDE 17.97 Fluid Pack lot # [id number]   CATARACT EXTRACTION W/PHACO Left 03/26/2019   Procedure: CATARACT EXTRACTION PHACO AND INTRAOCULAR LENS PLACEMENT ([place]) LEFT VISION BLUE;  Surgeon: Marchia Meiers, MD;  Location: [facility];  Service: Ophthalmology;  Laterality: Left;  [place] 01:16.2 CDE 11.70 FLUID PACK LOT # [id number]   CHOLECYSTECTOMY  2008   COLONOSCOPY  04/2011   CYSTOCELE REPAIR N/A 12/31/2013   Procedure: ANTERIOR REPAIR (CYSTOCELE);  Surgeon: Donnamae Jude, MD;  Location: [facility];  Service: Gynecology;  Laterality: N/A;   CYSTOSCOPY W/ URETERAL STENT PLACEMENT Left 01/16/2016   Procedure: CYSTOSCOPY WITH STENT REPLACEMENT;  Surgeon: Hollice Espy, MD;  Location: [facility];  Service: Urology;  Laterality: Left;   CYSTOSCOPY WITH STENT PLACEMENT Left 12/23/2015   Procedure: CYSTOSCOPY WITH STENT PLACEMENT;  Surgeon: Ardis Hughs, MD;  Location:  [facility];  Service: Urology;  Laterality: Left;   DIAGNOSTIC LAPAROSCOPY     EYE SURGERY     kidney stone removed     MELANOMA EXCISION  2015   left posterior thigh   TONSILLECTOMY  1944   TUBAL LIGATION     UPPER GI ENDOSCOPY     URETEROSCOPY WITH HOLMIUM LASER LITHOTRIPSY Left 01/16/2016   Procedure: URETEROSCOPY WITH HOLMIUM LASER LITHOTRIPSY;  Surgeon: Hollice Espy, MD;  Location: [facility];  Service: Urology;  Laterality: Left;   WISDOM TOOTH EXTRACTION       Family History  Problem Relation Age of Onset   Alzheimer's disease Mother    Alcohol abuse Father    COPD Brother    Heart disease Neg Hx    Cancer Neg Hx     Social History   Socioeconomic History   Marital status: Divorced    Spouse name: Not on file   Number of children: 3   Years of education: Not on file   Highest education level: Not on file  Occupational History   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 Then son [name]   Would accept resuscitation attempts but no prolonged ventilation   No tube feeds if cognitively unaware      Social Determinants of Health   Financial Resource Strain: Not on file  Food Insecurity: Not on file  Transportation Needs: Not on file  Physical Activity: Not on file  Stress: Not on file  Social Connections: Not on file  Intimate Partner Violence: Not on file   Review of Systems Eats "but not enough" Weight is down some Not sleeping well---having trouble due to nerves    Objective:   Physical Exam Neurological:     Mental Status: She is alert.  Psychiatric:     Comments: Clearly anxious ?depressed as well           Assessment & Plan:

## 2020-10-26 NOTE — Assessment & Plan Note (Signed)
Has lost about 10% of body weight Hopefully appetite will be better with Rx for the anxiety

## 2020-10-26 NOTE — Patient Instructions (Signed)
Please start the sertraline with 1/2 of the 50mg  tablet for the first 6 days. (Cut the first 3 in half and take 1/2 daily). After the 6 days, if you are not having problems with the medication, increase to the full tablet daily. Please call if you have any problems with the medication, and we can change to a different.

## 2020-11-01 ENCOUNTER — Telehealth: Payer: Self-pay | Admitting: Internal Medicine

## 2020-11-01 NOTE — Telephone Encounter (Signed)
Left detailed message on VM per DPR with Dr Alla German recommendation.

## 2020-11-01 NOTE — Telephone Encounter (Signed)
Have her try just 1/2 (25mg ) daily before we give up on it. All medications have a chance for side effects and this type of thing will sometimes settle down

## 2020-11-01 NOTE — Telephone Encounter (Signed)
Pt said that the sertraline (ZOLOFT) 50 MG tablet is causing her to have bad headaches since she started taking it on 10/26/20. She wanted to know if something else could be called in with less side effects

## 2020-11-02 NOTE — Telephone Encounter (Signed)
Katie Bell left a voicemail stating that he was calling on behalf of the patient. Gerald Stabs stated that the patient is having problems with the Sertraline 50 mg that she has been taking. Gerald Stabs stated that the patient has called him because she thought a different medication was going to be called in for her. Gerald Stabs requested that someone reach out to the patient to discuss.

## 2020-11-03 NOTE — Telephone Encounter (Signed)
Left message for pt to call office

## 2020-11-03 NOTE — Telephone Encounter (Signed)
I called the pt, again, and had to leave a message and repeated what Dr Silvio Pate suggested to do with the Zoloft. I called the pharmacy and had to leave a message for Gerald Stabs letting him know what Dr Silvio Pate wanted her to do.

## 2020-11-03 NOTE — Telephone Encounter (Signed)
Please find out if she is willing to try the other medication I mentioned. If so, send Rx for duloxetine 20mg  daily (#30 x 1) and make sure she has follow up in a month or so. Please put sertraline on her intolerance list  (We may need to consider lower dose diazepam if she doesn't tolerate this either ---not to tell her yet)

## 2020-11-03 NOTE — Telephone Encounter (Signed)
Patient called stating that she has tried 1/2 and a quarter size tablet and it gave her migraine type of a headache. She can not take it. Please advise

## 2020-11-04 MED ORDER — DULOXETINE HCL 20 MG PO CPEP: 20.0000 mg | ORAL_CAPSULE | Freq: Every day | ORAL | 1 refills | Status: DC

## 2020-11-04 NOTE — Telephone Encounter (Signed)
Patient advised. RX sent in. Patient already has appointment in August. Sertraline added to the allergy list. I did not mention Diazepam.

## 2020-11-04 NOTE — Addendum Note (Signed)
Addended by: Kris Mouton on: 11/04/2020 08:48 AM   Modules accepted: Orders

## 2020-11-23 ENCOUNTER — Other Ambulatory Visit: Payer: Self-pay

## 2020-11-23 ENCOUNTER — Encounter: Payer: Self-pay | Admitting: Internal Medicine

## 2020-11-23 ENCOUNTER — Ambulatory Visit (INDEPENDENT_AMBULATORY_CARE_PROVIDER_SITE_OTHER): Payer: Medicare HMO | Admitting: Internal Medicine

## 2020-11-23 DIAGNOSIS — F39 Unspecified mood [affective] disorder: Secondary | ICD-10-CM

## 2020-11-23 NOTE — Progress Notes (Signed)
Subjective:    Patient ID: Katie Bell, female    DOB: Feb 09, 1941, 80 y.o.   MRN: [id number]  HPI Here for follow up of anxiety This visit occurred during the SARS-CoV-2 public health emergency.  Safety protocols were in place, including screening questions prior to the visit, additional usage of staff PPE, and extensive cleaning of exam room while observing appropriate contact time as indicated for disinfecting solutions.   Didn't tolerate duloxetine---couldn't sleep Feels better now though No panicky feelings or palpitations Anxiety seems better  Current Outpatient Medications on File Prior to Visit  Medication Sig Dispense Refill   amLODipine (NORVASC) 5 MG tablet TAKE 1 TABLET BY MOUTH ONCE DAILY 90 tablet 3   Cholecalciferol (VITAMIN D) 400 UNITS capsule Take 800 Units by mouth daily.      clotrimazole-betamethasone (LOTRISONE) cream Apply 1 application topically 2 (two) times daily. To groin and peri-anal area rash, for 1-2 weeks (until healed). 30 g 0   potassium chloride SA (KLOR-CON) 20 MEQ tablet Take 1 tablet (20 mEq total) by mouth daily. 30 tablet 11   hydrocortisone 2.5 % cream Apply topically 3 (three) times daily as needed. To hemorrhoids 28 g 3   No current facility-administered medications on file prior to visit.    Allergies  Allergen Reactions   Sulfamethoxazole-Trimethoprim Swelling    Angioedema and pruritis per 01/01/2017 documentation in care everywhere   Estradiol Other (See Comments)    Vaginal irritation Other reaction(s): Other (See Comments) Vaginal irritation   Morphine     agitation   Sertraline Hcl Other (See Comments)    Migraine headaches, severe   Morphine And Related Anxiety    Past Medical History:  Diagnosis Date   Anxiety    Cancer ([place]) 2015   basal cell removed from leg   Depression    Episodic mood disorder ([place])    mostly anxiety--depression in the past   Gastric ulcer    History   GERD (gastroesophageal reflux disease)     History of blood transfusion    At [facility] appro [facility] yrs ago   History of hiatal hernia    History of kidney stones 1960   Hypertension    Osteoarthritis, hand    hand   Postmenopausal disorder    SVD (spontaneous vaginal delivery)    x 3    Past Surgical History:  Procedure Laterality Date   CATARACT EXTRACTION W/PHACO Right 02/12/2019   Procedure: CATARACT EXTRACTION PHACO AND INTRAOCULAR LENS PLACEMENT ([place]);  Surgeon: Marchia Meiers, MD;  Location: [facility];  Service: Ophthalmology;  Laterality: Right;  [place] 01:34.2 CDE 17.97 Fluid Pack lot # [id number]   CATARACT EXTRACTION W/PHACO Left 03/26/2019   Procedure: CATARACT EXTRACTION PHACO AND INTRAOCULAR LENS PLACEMENT ([place]) LEFT VISION BLUE;  Surgeon: Marchia Meiers, MD;  Location: [facility];  Service: Ophthalmology;  Laterality: Left;  [place] 01:16.2 CDE 11.70 FLUID PACK LOT # [id number]   CHOLECYSTECTOMY  2008   COLONOSCOPY  04/2011   CYSTOCELE REPAIR N/A 12/31/2013   Procedure: ANTERIOR REPAIR (CYSTOCELE);  Surgeon: Donnamae Jude, MD;  Location: [facility];  Service: Gynecology;  Laterality: N/A;   CYSTOSCOPY W/ URETERAL STENT PLACEMENT Left 01/16/2016   Procedure: CYSTOSCOPY WITH STENT REPLACEMENT;  Surgeon: Hollice Espy, MD;  Location: [facility];  Service: Urology;  Laterality: Left;   CYSTOSCOPY WITH STENT PLACEMENT Left 12/23/2015   Procedure: CYSTOSCOPY WITH STENT PLACEMENT;  Surgeon: Ardis Hughs, MD;  Location: [facility];  Service: Urology;  Laterality: Left;   DIAGNOSTIC LAPAROSCOPY     EYE SURGERY     kidney stone removed     MELANOMA EXCISION  2015   left posterior thigh   TONSILLECTOMY  1944   TUBAL LIGATION     UPPER GI ENDOSCOPY     URETEROSCOPY WITH HOLMIUM LASER LITHOTRIPSY Left 01/16/2016   Procedure: URETEROSCOPY WITH HOLMIUM LASER LITHOTRIPSY;  Surgeon: Hollice Espy, MD;  Location: [facility];  Service: Urology;  Laterality: Left;   WISDOM TOOTH EXTRACTION      Family History  Problem Relation  Age of Onset   Alzheimer's disease Mother    Alcohol abuse Father    COPD Brother    Heart disease Neg Hx    Cancer Neg Hx     Social History   Socioeconomic History   Marital status: Divorced    Spouse name: Not on file   Number of children: 3   Years of education: Not on file   Highest education level: Not on file  Occupational History   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 Then son [name]   Would accept resuscitation attempts but no prolonged ventilation   No tube feeds if cognitively unaware      Social Determinants of Health   Financial Resource Strain: Not on file  Food Insecurity: Not on file  Transportation Needs: Not on file  Physical Activity: Not on file  Stress: Not on file  Social Connections: Not on file  Intimate Partner Violence: Not on file   Review of Systems Sleeping better  Appetite is okay--has gained back 5# ("I make myself eat")     Objective:   Physical Exam Constitutional:      Appearance: Normal appearance.  Neurological:     Mental Status: She is alert.  Psychiatric:        Mood and Affect: Mood normal.        Behavior: Behavior normal.           Assessment & Plan:

## 2020-11-23 NOTE — Assessment & Plan Note (Signed)
Didn't tolerate the duloxetine I suspect she had a withdrawal reaction from the diazepam---and is now doing better No medication now

## 2020-12-12 ENCOUNTER — Other Ambulatory Visit: Payer: Self-pay | Admitting: Internal Medicine

## 2020-12-12 DIAGNOSIS — E876 Hypokalemia: Secondary | ICD-10-CM

## 2020-12-21 ENCOUNTER — Ambulatory Visit: Payer: Medicare HMO | Admitting: Internal Medicine

## 2020-12-28 ENCOUNTER — Other Ambulatory Visit: Payer: Self-pay

## 2020-12-28 ENCOUNTER — Emergency Department
Admission: EM | Admit: 2020-12-28 | Discharge: 2020-12-28 | Disposition: A | Discharge status: Home / Self Care | Payer: Medicare HMO | Attending: Emergency Medicine | Admitting: Emergency Medicine

## 2020-12-28 ENCOUNTER — Telehealth: Payer: Self-pay | Admitting: *Deleted

## 2020-12-28 DIAGNOSIS — K625 Hemorrhage of anus and rectum: Secondary | ICD-10-CM | POA: Diagnosis present

## 2020-12-28 DIAGNOSIS — Z79899 Other long term (current) drug therapy: Secondary | ICD-10-CM | POA: Insufficient documentation

## 2020-12-28 DIAGNOSIS — K648 Other hemorrhoids: Secondary | ICD-10-CM | POA: Insufficient documentation

## 2020-12-28 DIAGNOSIS — Z85828 Personal history of other malignant neoplasm of skin: Secondary | ICD-10-CM | POA: Diagnosis not present

## 2020-12-28 DIAGNOSIS — Z87891 Personal history of nicotine dependence: Secondary | ICD-10-CM | POA: Diagnosis not present

## 2020-12-28 DIAGNOSIS — I10 Essential (primary) hypertension: Secondary | ICD-10-CM | POA: Diagnosis not present

## 2020-12-28 LAB — CBC
HCT: 41.5 % (ref 36.0–46.0)
Hemoglobin: 14.1 g/dL (ref 12.0–15.0)
MCH: 31.5 pg (ref 26.0–34.0)
MCHC: 34 g/dL (ref 30.0–36.0)
MCV: 92.6 fL (ref 80.0–100.0)
Platelets: 255 10*3/uL (ref 150–400)
RBC: 4.48 MIL/uL (ref 3.87–5.11)
RDW: 11.9 % (ref 11.5–15.5)
WBC: 8.8 10*3/uL (ref 4.0–10.5)
nRBC: 0 % (ref 0.0–0.2)

## 2020-12-28 LAB — COMPREHENSIVE METABOLIC PANEL
ALT: 12 U/L (ref 0–44)
AST: 20 U/L (ref 15–41)
Albumin: 4.2 g/dL (ref 3.5–5.0)
Alkaline Phosphatase: 57 U/L (ref 38–126)
Anion gap: 9 (ref 5–15)
BUN: 15 mg/dL (ref 8–23)
CO2: 22 mmol/L (ref 22–32)
Calcium: 9.3 mg/dL (ref 8.9–10.3)
Chloride: 104 mmol/L (ref 98–111)
Creatinine, Ser: 0.98 mg/dL (ref 0.44–1.00)
GFR, Estimated: 58 mL/min — ABNORMAL LOW (ref >60)
Glucose, Bld: 138 mg/dL — ABNORMAL HIGH (ref 70–99)
Potassium: 3.9 mmol/L (ref 3.5–5.1)
Sodium: 135 mmol/L (ref 135–145)
Total Bilirubin: 0.8 mg/dL (ref 0.3–1.2)
Total Protein: 7.3 g/dL (ref 6.5–8.1)

## 2020-12-28 LAB — TYPE AND SCREEN
ABO/RH(D): O POS
Antibody Screen: NEGATIVE

## 2020-12-28 MED ORDER — HYDROCORTISONE ACETATE 25 MG RE SUPP: 25.0000 mg | Freq: Two times a day (BID) | RECTAL | 1 refills | Status: AC | PRN

## 2020-12-28 NOTE — Telephone Encounter (Signed)
PLEASE NOTE: All timestamps contained within this report are represented as [place] Standard Time. CONFIDENTIALTY NOTICE: This fax transmission is intended only for the addressee. It contains information that is legally privileged, confidential or otherwise protected from use or disclosure. If you are not the intended recipient, you are strictly prohibited from reviewing, disclosing, copying using or disseminating any of this information or taking any action in reliance on or regarding this information. If you have received this fax in error, please notify us immediately by telephone so that we can arrange for its return to [place]. Phone: [phone], Toll-Free: [phone], Fax: [phone] Page: 1 of 2 Call Id: [id number] [place] Day - Client TELEPHONE ADVICE RECORD [facility] Patient Name: Katie Bell Gender: Female DOB: 1940-05-20 Age: 80 Y 74 M 3 D Return Phone Number: [phone] (Secondary), [phone] (Alternate) Address: City/ State/ Zip: [place] [address] Client [facility] - Client Client Site [place] - Day Physician Viviana Simpler- MD Contact Type Call Who Is Calling Patient / Member / Family / Caregiver Call Type Triage / Clinical Relationship To Patient Self Return Phone Number [phone] (Secondary) Chief Complaint BLEEDING - Uncontrollable (not vaginal) Reason for Call Symptomatic / Request for [place] states she has severe rectal bleeding and is getting worse, and non stop. Translation No Nurse Assessment Nurse: Gildardo Pounds, RN, Amy Date/Time (Eastern Time): 12/28/2020 11:42:06 AM Confirm and document reason for call. If symptomatic, describe symptoms. ---Caller states she has severe rectal bleeding, and it is getting worse. It is usually when she goes to the bathroom. It is with stool. It has happened for 2-3 days now. She has a little bit of  cramping. She does not feel well. Does the patient have any new or worsening symptoms? ---Yes Will a triage be completed? ---Yes Related visit to physician within the last 2 weeks? ---No Does the PT have any chronic conditions? (i.e. diabetes, asthma, this includes High risk factors for pregnancy, etc.) ---No Is this a behavioral health or substance abuse call? ---No Guidelines Guideline Title Affirmed Question Affirmed Notes Nurse Date/Time Eilene Ghazi Time) Rectal Bleeding [1] MODERATE rectal bleeding (small blood clots, passing blood without stool, or toilet water turns red) AND [2] more than once a day Lovelace, RN, Amy 12/28/2020 11:43:30 AM Disp. Time Eilene Ghazi Time) Disposition Final User 12/28/2020 11:39:01 AM Send to Urgent Queue Aldrich Buccino, Leilani PLEASE NOTE: All timestamps contained within this report are represented as [place] Standard Time. CONFIDENTIALTY NOTICE: This fax transmission is intended only for the addressee. It contains information that is legally privileged, confidential or otherwise protected from use or disclosure. If you are not the intended recipient, you are strictly prohibited from reviewing, disclosing, copying using or disseminating any of this information or taking any action in reliance on or regarding this information. If you have received this fax in error, please notify us immediately by telephone so that we can arrange for its return to [place]. Phone: [phone], Toll-Free: [phone], Fax: [phone] Page: 2 of 2 Call Id: [id number] 12/28/2020 11:45:01 AM Go to ED Now Yes Lovelace, RN, Amy Caller Disagree/Comply Disagree Caller Understands Yes PreDisposition InappropriateToAsk Care Advice Given Per Guideline GO TO ED NOW: * You need to be seen in the Emergency Department. * Go to the ED at ___________ [place] now. Drive carefully. NOTE TO TRIAGER - DRIVING: * Another adult should drive. CARE ADVICE given per Rectal Bleeding  (Adult) guideline. Comments User: Wayne Sever,  RN Date/Time Eilene Ghazi Time): 12/28/2020 12:05:54 PM Notified Rollene Fare at the office of patient condition & refusal to go to the ER. Referrals GO TO FACILITY REFUSED

## 2020-12-28 NOTE — Telephone Encounter (Signed)
Received message from Access nurse. Have copied message below. Let son know we were trying to reach mother but were able to reach no further action needed at this time.     [place] Night - Client Nonclinical Telephone Record  [facility] Client [place] Night - Client Client Site [place] Physician [facility] - PHYSICIAN, Verita Schneiders- MD Contact Type Call Who Is Calling Patient / Member / Family / Caregiver Caller Name Lissa Merlin Phone Number [phone] Call Type Message Only Information Provided Reason for Call Returning a Call from the Office Initial Comment The caller states that he missed a call from the office that he believes was concerning his mother, Katie Bell. Disp. Time Disposition Final User 12/28/2020 1:27:49 PM General Information Provided Yes Rosalyn Gess

## 2020-12-28 NOTE — Telephone Encounter (Signed)
It definitely sounds like enough blood that ER evaluation is appropriate

## 2020-12-28 NOTE — ED Triage Notes (Signed)
Pt to ED for rectal bleeding for a couple days, states it is getting worse today. Reports weakness/fatigue started today. Skin color WDL C/o mild pain to mid abd

## 2020-12-28 NOTE — Telephone Encounter (Signed)
Spoke to patient by telephone and was advised that the rectal bleeding stated 2 to 3 days ago. Patient stated that the bleeding has gotten a lot worse today. Patient stated that the toilet is red with blood after a bowel movement. Patient stated the blood is so bad that she has gotten it all over herself. Patient stated that she is having some abdominal cramping. Patient stated that she is not feeling well. Patient started crying stating hat she is concerned and wants to know why she is bleeding like this. Patient was advised that she should be evaluated today with the symptoms that she is having. Patient stated that she does not feel like waiting in the ER. Advised patient that I will call the triage nurse at [facility] and let them know that she is coming. Patient stated that she will get her cousin to take her to the ER shortly. Called and spoke to Opal Sidles the triage nurse and gave her the information on the patient.

## 2020-12-28 NOTE — ED Provider Notes (Signed)
[facility] Emergency Department Provider Note   ____________________________________________   Event Date/Time   First MD Initiated Contact with Patient 12/28/20 1559     (approximate)  I have reviewed the triage vital signs and the nursing notes.   HISTORY  Chief Complaint Rectal Bleeding    HPI Katie Bell is a 80 y.o. female who presents for rectal bleeding  LOCATION: Rectum DURATION: 2 days prior to arrival TIMING: Worsening since onset SEVERITY: Mild QUALITY: Hematochezia CONTEXT: Patient states he has a history of internal and external hemorrhoids with pain and bleeding in the past who began having bright red blood per rectum yesterday that she states has worsened today MODIFYING FACTORS: Denies any exacerbating or relieving factors ASSOCIATED SYMPTOMS: Endorses mild suprapubic abdominal pain   Per medical record review, patient has history of internal and external hemorrhoids with bleeding          Past Medical History:  Diagnosis Date   Anxiety    Cancer ([place]) 2015   basal cell removed from leg   Depression    Episodic mood disorder ([place])    mostly anxiety--depression in the past   Gastric ulcer    History   GERD (gastroesophageal reflux disease)    History of blood transfusion    At [facility] appro 15 yrs ago   History of hiatal hernia    History of kidney stones 1960   Hypertension    Osteoarthritis, hand    hand   Postmenopausal disorder    SVD (spontaneous vaginal delivery)    x 3    Patient Active Problem List   Diagnosis Date Noted   Malnutrition of mild degree ([place]) 10/26/2020   External hemorrhoid, bleeding 07/21/2020   Adnexal mass 07/21/2020   Left ovarian cyst 01/05/2020   Visit for preventive health examination 02/16/2014   Advanced directives, counseling/discussion 02/16/2014   Female cystocele 11/02/2013   Hypertension    Gastric ulcer    Episodic mood disorder ([place])     Osteoarthritis, multiple sites     Past Surgical History:  Procedure Laterality Date   CATARACT EXTRACTION W/PHACO Right 02/12/2019   Procedure: CATARACT EXTRACTION PHACO AND INTRAOCULAR LENS PLACEMENT ([place]);  Surgeon: Marchia Meiers, MD;  Location: [facility] ORS;  Service: Ophthalmology;  Laterality: Right;  [place] 01:34.2 CDE 17.97 Fluid Pack lot # [id number]   CATARACT EXTRACTION W/PHACO Left 03/26/2019   Procedure: CATARACT EXTRACTION PHACO AND INTRAOCULAR LENS PLACEMENT ([place]) LEFT VISION BLUE;  Surgeon: Marchia Meiers, MD;  Location: [facility] ORS;  Service: Ophthalmology;  Laterality: Left;  [place] 01:16.2 CDE 11.70 FLUID PACK LOT # [id number]   CHOLECYSTECTOMY  2008   COLONOSCOPY  04/2011   CYSTOCELE REPAIR N/A 12/31/2013   Procedure: ANTERIOR REPAIR (CYSTOCELE);  Surgeon: Donnamae Jude, MD;  Location: [place] [facility];  Service: Gynecology;  Laterality: N/A;   CYSTOSCOPY W/ URETERAL STENT PLACEMENT Left 01/16/2016   Procedure: CYSTOSCOPY WITH STENT REPLACEMENT;  Surgeon: Hollice Espy, MD;  Location: [facility] ORS;  Service: Urology;  Laterality: Left;   CYSTOSCOPY WITH STENT PLACEMENT Left 12/23/2015   Procedure: CYSTOSCOPY WITH STENT PLACEMENT;  Surgeon: Ardis Hughs, MD;  Location: [facility] OR[facility];  Service: Urology;  Laterality: Left;   DIAGNOSTIC LAPAROSCOPY     EYE SURGERY     kidney stone removed     MELANOMA EXCISION  2015   left posterior thigh   TONSILLECTOMY  1944   TUBAL LIGATION     UPPER GI ENDOSCOPY  URETEROSCOPY WITH HOLMIUM LASER LITHOTRIPSY Left 01/16/2016   Procedure: URETEROSCOPY WITH HOLMIUM LASER LITHOTRIPSY;  Surgeon: Hollice Espy, MD;  Location: [facility] OR[facility];  Service: Urology;  Laterality: Left;   WISDOM TOOTH EXTRACTION      Prior to Admission medications   Medication Sig Start Date End Date Taking? Authorizing Provider  hydrocortisone (ANUSOL-HC) 25 MG suppository Place 1 suppository (25 mg total) rectally 2 (two) times daily as needed for up to 12 days for hemorrhoids or anal itching  (bleeding). 12/28/20 01/09/21 Yes Naaman Plummer, MD  amLODipine (NORVASC) 5 MG tablet TAKE 1 TABLET BY MOUTH ONCE DAILY 12/31/19   Venia Carbon, MD  Cholecalciferol (VITAMIN D) 400 UNITS capsule Take 800 Units by mouth daily.     [provider]  clotrimazole-betamethasone (LOTRISONE) cream Apply 1 application topically 2 (two) times daily. To groin and peri-anal area rash, for 1-2 weeks (until healed). 07/12/20   Duffy Bruce, MD  hydrocortisone 2.5 % cream Apply topically 3 (three) times daily as needed. To hemorrhoids 07/21/20   Viviana Simpler I, MD  potassium chloride SA (KLOR-CON) 20 MEQ tablet TAKE 1 TABLET BY MOUTH ONCE DAILY 12/12/20   Venia Carbon, MD    Allergies Sulfamethoxazole-trimethoprim, Estradiol, Morphine, Sertraline hcl, and Morphine and related  Family History  Problem Relation Age of Onset   Alzheimer's disease Mother    Alcohol abuse Father    COPD Brother    Heart disease Neg Hx    Cancer Neg Hx     Social History Social History   Tobacco Use   Smoking status: Former   Smokeless tobacco: Never  Substance Use Topics   Alcohol use: No    Alcohol/week: 0.0 standard drinks   Drug use: No    Review of Systems Constitutional: No fever/chills Eyes: No visual changes. ENT: No sore throat. Cardiovascular: Denies chest pain. Respiratory: Denies shortness of breath. Gastrointestinal: No abdominal pain.  No nausea, no vomiting.  No diarrhea.  Endorses rectal bleeding Genitourinary: Negative for dysuria. Musculoskeletal: Negative for acute arthralgias Skin: Negative for rash. Neurological: Negative for headaches, weakness/numbness/paresthesias in any extremity Psychiatric: Negative for suicidal ideation/homicidal ideation   ____________________________________________   PHYSICAL EXAM:  VITAL SIGNS: ED Triage Vitals  Enc Vitals Group     BP 12/28/20 1405 (!) 148/79     Pulse Rate 12/28/20 1405 88     Resp 12/28/20 1405 18     Temp  12/28/20 1405 98.1 F (36.7 C)     Temp Source 12/28/20 1405 Oral     SpO2 12/28/20 1405 94 %     Weight 12/28/20 1406 104 lb (47.2 kg)     Height 12/28/20 1406 '5\' 1"'$  (1.549 m)     Head Circumference --      Peak Flow --      Pain Score 12/28/20 1406 4     Pain Loc --      Pain Edu? --      Excl. in [place]? --    Constitutional: Alert and oriented. Well appearing and in no acute distress. Eyes: Conjunctivae are normal. PERRL. Head: Atraumatic. Nose: No congestion/rhinnorhea. Mouth/Throat: Mucous membranes are moist. Neck: No stridor Cardiovascular: Grossly normal heart sounds.  Good peripheral circulation. Respiratory: Normal respiratory effort.  No retractions. Gastrointestinal: Soft and nontender. No distention. Rectal: External nonthrombosed hemorrhoids with small amount of dried red blood and palpable internal hemorrhoids Musculoskeletal: No obvious deformities Neurologic:  Normal speech and language. No gross focal neurologic deficits are appreciated. Skin:  Skin is warm and dry. No rash noted. Psychiatric: Mood and affect are normal. Speech and behavior are normal.  ____________________________________________   LABS (all labs ordered are listed, but only abnormal results are displayed)  Labs Reviewed  COMPREHENSIVE METABOLIC PANEL - Abnormal; Notable for the following components:      Result Value   Glucose, Bld 138 (*)    GFR, Estimated 58 (*)    All other components within normal limits  CBC  POC OCCULT BLOOD, ED  TYPE AND SCREEN   PROCEDURES  Procedure(s) performed (including Critical Care):  .1-3 Lead EKG Interpretation  Date/Time: 12/28/2020 5:56 PM Performed by: Naaman Plummer, MD Authorized by: Naaman Plummer, MD     Interpretation: normal     ECG rate:  78   ECG rate assessment: normal     Rhythm: sinus rhythm     Ectopy: none     Conduction: normal     ____________________________________________   INITIAL IMPRESSION / ASSESSMENT AND PLAN /  ED COURSE  As part of my medical decision making, I reviewed the following data within the electronic medical record, if available:  Nursing notes reviewed and incorporated, Labs reviewed, EKG interpreted, Old chart reviewed, Radiograph reviewed and Notes from prior ED visits reviewed and incorporated        Given history and exam patients presentation most consistent with Lower GI bleed possibly secondary to hemorrhoid or other nonemergent cause of bleeding. I have low suspicion for Aortoenteric fistula, Upper GI Bleed, IBD, Mesenteric Ischemia, Rectal foreign body or ulcer.  Workup: CBC, BMP, PT/INR, Type and Screen  Disposition: Discharge. Hemodynamically stable with no gross blood on rectal exam. SRP and prompt PCP follow up.      ____________________________________________   FINAL CLINICAL IMPRESSION(S) / ED DIAGNOSES  Final diagnoses:  Rectal bleeding  Bleeding internal hemorrhoids     ED Discharge Orders          Ordered    hydrocortisone (ANUSOL-HC) 25 MG suppository  2 times daily PRN        12/28/20 1727             Note:  This document was prepared using Dragon voice recognition software and may include unintentional dictation errors.    Naaman Plummer, MD 12/28/20 [phone]

## 2020-12-29 ENCOUNTER — Telehealth: Payer: Self-pay

## 2020-12-29 NOTE — Telephone Encounter (Signed)
Spoke to pt about ER visit yesterday. She said she is feeling better and has started the hydrocortisone suppositories today. She wants to hold off on an OV for now. Will call back next week if she feels she needs to be seen.

## 2021-01-03 ENCOUNTER — Other Ambulatory Visit: Payer: Self-pay | Admitting: Internal Medicine

## 2021-03-23 ENCOUNTER — Ambulatory Visit: Payer: Medicare HMO | Admitting: Dermatology

## 2021-03-23 ENCOUNTER — Other Ambulatory Visit: Payer: Self-pay

## 2021-03-23 DIAGNOSIS — L2081 Atopic neurodermatitis: Secondary | ICD-10-CM

## 2021-03-23 DIAGNOSIS — L853 Xerosis cutis: Secondary | ICD-10-CM

## 2021-03-23 MED ORDER — TRIAMCINOLONE ACETONIDE 0.1 % EX CREA: 1.0000 "application " | TOPICAL_CREAM | CUTANEOUS | 1 refills | Status: DC

## 2021-03-23 NOTE — Progress Notes (Signed)
   Follow-Up Visit   Subjective  Katie Bell is a 80 y.o. female who presents for the following: Rash (Shoulders, back, arms, 2-3 weeks tried some otc creams didn't help, it has been worrying her).  The following portions of the chart were reviewed this encounter and updated as appropriate:   Tobacco  Allergies  Meds  Problems  Med Hx  Surg Hx  Fam Hx     Review of Systems:  No other skin or systemic complaints except as noted in HPI or Assessment and Plan.  Objective  Well appearing patient in no apparent distress; mood and affect are within normal limits.  A focused examination was performed including trunk, legs, arms. Relevant physical exam findings are noted in the Assessment and Plan.  back, shoulders, arms, legs Xerosis back, shoulders, arms, legs  back, chest, shoulders, legs Mild scale and roughness of skin some areas pink   Assessment & Plan  Xerosis cutis back, shoulders, arms, legs Mild cleansers/Taltz sensitive skin soap and CeraVe cream all over after bath or shower.  Atopic neurodermatitis back, chest, shoulders, legs Atopic dermatitis (eczema) is a chronic, relapsing, pruritic condition that can significantly affect quality of life. It is often associated with allergic rhinitis and/or asthma and can require treatment with topical medications, phototherapy, or in severe cases biologic injectable medication (Dupixent; Adbry) or Oral JAK inhibitors.   Start TMC 0.1% cr to aa rash bid for 2 weeks, then decrease to qd up to 5 days a week  Recommend mild soap and moisturizing cream 1-2 times daily.  Gentle skin care handout provided.    triamcinolone cream (KENALOG) 0.1 % - back, chest, shoulders, legs Apply 1 application topically as directed. Apply to aa rash on back, shoulders, chest, and legs bid for 2 weeks, then Qd 5 days per week  until clear, avoid face, groin, axilla  Return in about 2 months (around 05/24/2021) for Atopic Derm.  I, Othelia Pulling,  RMA, am acting as scribe for Sarina Ser, MD . Documentation: I have reviewed the above documentation for accuracy and completeness, and I agree with the above.  Sarina Ser, MD

## 2021-03-23 NOTE — Patient Instructions (Addendum)
If You Need Anything After Your Visit  If you have any questions or concerns for your doctor, please call our main line at [phone] and press option 4 to reach your doctor's medical assistant. If no one answers, please leave a voicemail as directed and we will return your call as soon as possible. Messages left after 4 pm will be answered the following business day.   You may also send us a message via MyChart. We typically respond to MyChart messages within 1-2 business days.  For prescription refills, please ask your pharmacy to contact our office. Our fax number is [phone].  If you have an urgent issue when the clinic is closed that cannot wait until the next business day, you can page your doctor at the number below.    Please note that while we do our best to be available for urgent issues outside of office hours, we are not available 24/7.   If you have an urgent issue and are unable to reach us, you may choose to seek medical care at your doctor's office, retail clinic, urgent care center, or emergency room.  If you have a medical emergency, please immediately call 911 or go to the emergency department.  Pager Numbers  - Dr. Kowalski: [phone]  - Dr. Moye: [phone]  - Dr. Stewart: [phone]  In the event of inclement weather, please call our main line at [phone] for an update on the status of any delays or closures.  Dermatology Medication Tips: Please keep the boxes that topical medications come in in order to help keep track of the instructions about where and how to use these. Pharmacies typically print the medication instructions only on the boxes and not directly on the medication tubes.   If your medication is too expensive, please contact our office at [phone] option 4 or send us a message through MyChart.   We are unable to tell what your co-pay for medications will be in advance as this is different depending on your insurance coverage.  However, we may be able to find a substitute medication at lower cost or fill out paperwork to get insurance to cover a needed medication.   If a prior authorization is required to get your medication covered by your insurance company, please allow us 1-2 business days to complete this process.  Drug prices often vary depending on where the prescription is filled and some pharmacies may offer cheaper prices.  The website www.goodrx.com contains coupons for medications through different pharmacies. The prices here do not account for what the cost may be with help from insurance (it may be cheaper with your insurance), but the website can give you the price if you did not use any insurance.  - You can print the associated coupon and take it with your prescription to the pharmacy.  - You may also stop by our office during regular business hours and pick up a GoodRx coupon card.  - If you need your prescription sent electronically to a different pharmacy, notify our office through [place] MyChart or by phone at [phone] option 4.     Si Usted Necesita Algo Despus de Su Visita  Tambin puede enviarnos un mensaje a travs de MyChart. Por lo general respondemos a los mensajes de MyChart en el transcurso de 1 a 2 das hbiles.  Para renovar recetas, por favor pida a su farmacia que se ponga en contacto con nuestra oficina. Nuestro nmero de fax es el [phone].  Si tiene   un asunto urgente cuando la clnica est cerrada y que no puede esperar hasta el siguiente da hbil, puede llamar/localizar a su doctor(a) al nmero que aparece a continuacin.   Por favor, tenga en cuenta que aunque hacemos todo lo posible para estar disponibles para asuntos urgentes fuera del horario de [place], no estamos disponibles las 24 horas del da, los 7 das de la [place].   Si tiene un problema urgente y no puede comunicarse con nosotros, puede optar por buscar atencin mdica  en el consultorio de su  doctor(a), en una clnica privada, en un centro de atencin urgente o en una sala de emergencias.  Si tiene Engineering geologist, por favor llame inmediatamente al 911 o vaya a la sala de emergencias.  Nmeros de bper  - Dr. Nehemiah Massed: [phone]  - Dra. Moye: [phone]  - Dra. Nicole Kindred: [phone]  En caso de inclemencias del [place], por favor llame a Johnsie Kindred principal al [phone] para una actualizacin sobre el [place] de cualquier retraso o cierre.  Consejos para la medicacin en dermatologa: Por favor, guarde las cajas en las que vienen los medicamentos de uso tpico para ayudarle a seguir las instrucciones sobre dnde y cmo usarlos. Las farmacias generalmente imprimen las instrucciones del medicamento slo en las cajas y no directamente en los tubos del [place].   Si su medicamento es muy caro, por favor, pngase en contacto con [name] llamando al [phone] y presione la opcin 4 o envenos un mensaje a travs de Pharmacist, community.   No podemos decirle cul ser su copago por los medicamentos por adelantado ya que esto es diferente dependiendo de la cobertura de su seguro. Sin embargo, es posible que podamos encontrar un medicamento sustituto a Electrical engineer un formulario para que el seguro cubra el medicamento que se considera necesario.   Si se requiere una autorizacin previa para que su compaa de seguros [place] su medicamento, por favor permtanos de 1 a 2 das hbiles para completar este proceso.  Los precios de los medicamentos varan con frecuencia dependiendo del Environmental consultant de dnde se surte la receta y alguna farmacias pueden ofrecer precios ms baratos.  El sitio web www.goodrx.com tiene cupones para medicamentos de Airline pilot. Los precios aqu no tienen en cuenta lo que podra costar con la ayuda del seguro (puede ser ms barato con su seguro), pero el sitio web puede darle el precio si no utiliz Research scientist (physical sciences).  - Puede imprimir el cupn  correspondiente y llevarlo con su receta a la farmacia.  - Tambin puede pasar por nuestra oficina durante el horario de atencin regular y Charity fundraiser una tarjeta de cupones de GoodRx.  - Si necesita que su receta se enve electrnicamente a una farmacia diferente, informe a nuestra oficina a travs de MyChart de [place] o por telfono llamando al [phone] y presione la opcin 4.   Start mild soap like dove for sensitive skin and moisturizer daily, moisturizers that are good are Cerave cream or Cetaphil cream

## 2021-04-04 ENCOUNTER — Encounter: Payer: Self-pay | Admitting: Dermatology

## 2021-04-26 ENCOUNTER — Encounter: Payer: Self-pay | Admitting: Medical Oncology

## 2021-04-26 ENCOUNTER — Emergency Department
Admission: EM | Admit: 2021-04-26 | Discharge: 2021-04-26 | Disposition: A | Discharge status: Home / Self Care | Payer: Medicare HMO | Attending: Emergency Medicine | Admitting: Emergency Medicine

## 2021-04-26 DIAGNOSIS — R251 Tremor, unspecified: Secondary | ICD-10-CM

## 2021-04-26 DIAGNOSIS — I10 Essential (primary) hypertension: Secondary | ICD-10-CM | POA: Insufficient documentation

## 2021-04-26 LAB — COMPREHENSIVE METABOLIC PANEL
ALT: 10 U/L (ref 0–44)
AST: 17 U/L (ref 15–41)
Albumin: 4.3 g/dL (ref 3.5–5.0)
Alkaline Phosphatase: 67 U/L (ref 38–126)
Anion gap: 5 (ref 5–15)
BUN: 12 mg/dL (ref 8–23)
CO2: 25 mmol/L (ref 22–32)
Calcium: 9.5 mg/dL (ref 8.9–10.3)
Chloride: 105 mmol/L (ref 98–111)
Creatinine, Ser: 0.86 mg/dL (ref 0.44–1.00)
GFR, Estimated: >60 mL/min (ref >60)
Glucose, Bld: 108 mg/dL — ABNORMAL HIGH (ref 70–99)
Potassium: 3.8 mmol/L (ref 3.5–5.1)
Sodium: 135 mmol/L (ref 135–145)
Total Bilirubin: 0.8 mg/dL (ref 0.3–1.2)
Total Protein: 8 g/dL (ref 6.5–8.1)

## 2021-04-26 LAB — CBC
HCT: 43.3 % (ref 36.0–46.0)
Hemoglobin: 14.6 g/dL (ref 12.0–15.0)
MCH: 31.3 pg (ref 26.0–34.0)
MCHC: 33.7 g/dL (ref 30.0–36.0)
MCV: 92.7 fL (ref 80.0–100.0)
Platelets: 227 10*3/uL (ref 150–400)
RBC: 4.67 MIL/uL (ref 3.87–5.11)
RDW: 11.8 % (ref 11.5–15.5)
WBC: 6.6 10*3/uL (ref 4.0–10.5)
nRBC: 0 % (ref 0.0–0.2)

## 2021-04-26 LAB — CBG MONITORING, ED: Glucose-Capillary: 110 mg/dL — ABNORMAL HIGH (ref 70–99)

## 2021-04-26 NOTE — ED Triage Notes (Signed)
Pt ambulatory to triage reports that she thinks she had a seizure. 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 States that her head was shaking only and she remembers the episode. No incontinence. Pt states that she was laying down when the shaking occurred. Pt denies pain. A/O x 4 at this time.

## 2021-04-26 NOTE — ED Provider Triage Note (Signed)
Emergency Medicine Provider Triage Evaluation Note  Katie Bell , a 81 y.o. female  was evaluated in triage.  Pt states that she believes she had a seizure today. No history of seizures. She states that her head was shaking inside. She denies pain. No incontinence.   Review of Systems   Level 5 Caveat: Confused  Physical Exam  There were no vitals taken for this visit. Gen:   Awake, no distress   Resp:  Normal effort  MSK:   Moves extremities without difficulty  Other:  Confused  Medical Decision Making  Medically screening exam initiated at 1:48 PM.  Appropriate orders placed.  Katie Bell was informed that the remainder of the evaluation will be completed by another provider, this initial triage assessment does not replace that evaluation, and the importance of remaining in the ED until their evaluation is complete.  Altered mental status workup started in triage.    Victorino Dike, FNP 04/26/21 1353

## 2021-04-26 NOTE — ED Provider Notes (Signed)
Available  [facility] Provider Note    Event Date/Time   First MD Initiated Contact with Patient 04/26/21 2106     (approximate)   History   Shaking   HPI  Katie Bell is a 81 y.o. female who presents to the ED for evaluation of Shaking I reviewed PCP visit from 8/3.History of HTN, anxiety and depression. Patient lives at home with her son and she is independently ambulatory.  Patient presents to the ED for evaluation of an episode of sensation that her scalp was shaking this morning.  Reports that she did not sleep last night due to anxiety, and this morning around 9 AM, while sitting in bed and trying to go back to sleep she felt like her scalp at the top of her head and a crown distribution was shaking and tremulous for 2 seconds.  This self resolved and has not recurred.  She denies any associated symptoms with this such as headache, dizziness, pain, shaking of the extremities, incontinence or tongue biting.  She reports feeling fine now and is worried that she had a seizure.   Physical Exam   Triage Vital Signs: ED Triage Vitals  Enc Vitals Group     BP 04/26/21 1352 (!) 155/73     Pulse Rate 04/26/21 1352 86     Resp 04/26/21 1352 16     Temp 04/26/21 1352 98.7 F (37.1 C)     Temp Source 04/26/21 1352 Oral     SpO2 04/26/21 1352 98 %     Weight 04/26/21 1353 105 lb (47.6 kg)     Height 04/26/21 1353 5\' 2"  (1.575 m)     Head Circumference --      Peak Flow --      Pain Score 04/26/21 1353 0     Pain Loc --      Pain Edu? --      Excl. in [place]? --     Most recent vital signs: Vitals:   04/26/21 1352 04/26/21 1818  BP: (!) 155/73 (!) 185/81  Pulse: 86 81  Resp: 16 20  Temp: 98.7 F (37.1 C)   SpO2: 98% 98%    General: Awake, no distress.  Gets up out of bed and is ambulatory with a normal gait independently. CV:  Good peripheral perfusion.  Resp:  Normal effort.  Abd:  No distention.  MSK:  No deformity noted.  Neuro:  No  focal deficits appreciated. Other:  Cranial nerves II through XII intact 5/5 strength and sensation in all 4 extremities   ED Results / Procedures / Treatments   Labs (all labs ordered are listed, but only abnormal results are displayed) Labs Reviewed  COMPREHENSIVE METABOLIC PANEL - Abnormal; Notable for the following components:      Result Value   Glucose, Bld 108 (*)    All other components within normal limits  CBG MONITORING, ED - Abnormal; Notable for the following components:   Glucose-Capillary 110 (*)    All other components within normal limits  CBC    EKG Sinus rhythm with a rate of 88 bpm.  Normal axis and intervals.  No evidence of acute ischemia.   RADIOLOGY   Official radiology report(s): No results found.  PROCEDURES and INTERVENTIONS:  Procedures  Medications - No data to display   IMPRESSION / MDM / [place] / ED COURSE  I reviewed the triage vital signs and the nursing notes.  Nervous 81 year old female presents to  the ED after a very brief episode of the sensation of her scalp shaking this morning, without evidence of acute pathology, and suitable for outpatient management.  She looks clinically well to me without evidence of neurologic or vascular deficits, no signs of trauma or really any pathology.  She is ambulatory with a brisk gait and quite functional at baseline.  Blood work is benign without electrolyte disturbances, blood count derangements.  EKG is nonischemic.  I see no indications for CNS imaging.  Her symptoms are quite atypical for seizure, no evidence of syncope or stroke.  We will have her follow-up with her PCP.  Return precautions for the ED discussed.      FINAL CLINICAL IMPRESSION(S) / ED DIAGNOSES   Final diagnoses:  Episode of shaking     Rx / DC Orders   ED Discharge Orders     None        Note:  This document was prepared using Dragon voice recognition software and may include unintentional  dictation errors.   Vladimir Crofts, MD 04/26/21 2126

## 2021-05-05 ENCOUNTER — Telehealth: Payer: Self-pay

## 2021-05-05 NOTE — Telephone Encounter (Signed)
Spoke to pt. She is doing better after her ER visit 04-24-21

## 2021-05-23 ENCOUNTER — Encounter: Payer: Self-pay | Admitting: Dermatology

## 2021-05-24 ENCOUNTER — Ambulatory Visit: Payer: Medicare HMO | Admitting: Dermatology

## 2021-05-24 ENCOUNTER — Other Ambulatory Visit: Payer: Self-pay

## 2021-05-24 DIAGNOSIS — L853 Xerosis cutis: Secondary | ICD-10-CM

## 2021-05-24 DIAGNOSIS — L2081 Atopic neurodermatitis: Secondary | ICD-10-CM | POA: Diagnosis not present

## 2021-05-24 MED ORDER — TRIAMCINOLONE ACETONIDE 0.1 % EX CREA: 1.0000 "application " | TOPICAL_CREAM | CUTANEOUS | 1 refills | Status: DC

## 2021-05-24 NOTE — Patient Instructions (Signed)
Recommend Cerave cream all daily.   If You Need Anything After Your Visit  If you have any questions or concerns for your doctor, please call our main line at [phone] and press option 4 to reach your doctor's medical assistant. If no one answers, please leave a voicemail as directed and we will return your call as soon as possible. Messages left after 4 pm will be answered the following business day.   You may also send [place] a message via [place]. We typically respond to MyChart messages within 1-2 business days.  For prescription refills, please ask your pharmacy to contact our office. Our fax number is [phone].  If you have an urgent issue when the clinic is closed that cannot wait until the next business day, you can page your doctor at the number below.    Please note that while we do our best to be available for urgent issues outside of office hours, we are not available 24/7.   If you have an urgent issue and are unable to reach [place], you may choose to seek medical care at your doctor's office, retail clinic, urgent care center, or emergency room.  If you have a medical emergency, please immediately call 911 or go to the emergency department.  Pager Numbers  - Dr. Nehemiah Massed: [phone]  - Dr. Laurence Ferrari: [phone]  - Dr. Nicole Kindred: [phone]  In the event of inclement weather, please call our main line at [phone] for an update on the status of any delays or closures.  Dermatology Medication Tips: Please keep the boxes that topical medications come in in order to help keep track of the instructions about where and how to use these. Pharmacies typically print the medication instructions only on the boxes and not directly on the medication tubes.   If your medication is too expensive, please contact our office at [phone] option 4 or send [place] a message through [place].   We are unable to tell what your co-pay for medications will be in advance as this is different  depending on your insurance coverage. However, we may be able to find a substitute medication at lower cost or fill out paperwork to get insurance to cover a needed medication.   If a prior authorization is required to get your medication covered by your insurance company, please allow [place] 1-2 business days to complete this process.  Drug prices often vary depending on where the prescription is filled and some pharmacies may offer cheaper prices.  The website www.goodrx.com contains coupons for medications through different pharmacies. The prices here do not account for what the cost may be with help from insurance (it may be cheaper with your insurance), but the website can give you the price if you did not use any insurance.  - You can print the associated coupon and take it with your prescription to the pharmacy.  - You may also stop by our office during regular business hours and pick up a GoodRx coupon card.  - If you need your prescription sent electronically to a different pharmacy, notify our office through [facility] or by phone at [phone] option 4.     Si Usted Necesita Algo Despus de Su Visita  Tambin puede enviarnos un mensaje a travs de Pharmacist, community. Por lo general respondemos a los mensajes de MyChart en el transcurso de 1 a 2 das hbiles.  Para renovar recetas, por favor pida a su farmacia que se ponga en contacto con nuestra oficina. Nuestro nmero de  fax es el [phone].  Si tiene un asunto urgente cuando la clnica est cerrada y que no puede esperar hasta el siguiente da hbil, puede llamar/localizar a su doctor(a) al nmero que aparece a continuacin.   Por favor, tenga en cuenta que aunque hacemos todo lo posible para estar disponibles para asuntos urgentes fuera del horario de [place], no estamos disponibles las 24 horas del da, los 7 das de la [place].   Si tiene un problema urgente y no puede comunicarse con nosotros, puede optar por buscar atencin  mdica  en el consultorio de su doctor(a), en una clnica privada, en un centro de atencin urgente o en una sala de emergencias.  Si tiene Engineering geologist, por favor llame inmediatamente al 911 o vaya a la sala de emergencias.  Nmeros de bper  - Dr. Nehemiah Massed: [phone]  - Dra. Moye: [phone]  - Dra. Nicole Kindred: [phone]  En caso de inclemencias del [place], por favor llame a Johnsie Kindred principal al [phone] para una actualizacin sobre el [place] de cualquier retraso o cierre.  Consejos para la medicacin en dermatologa: Por favor, guarde las cajas en las que vienen los medicamentos de uso tpico para ayudarle a seguir las instrucciones sobre dnde y cmo usarlos. Las farmacias generalmente imprimen las instrucciones del medicamento slo en las cajas y no directamente en los tubos del [place].   Si su medicamento es muy caro, por favor, pngase en contacto con [name] llamando al [phone] y presione la opcin 4 o envenos un mensaje a travs de Pharmacist, community.   No podemos decirle cul ser su copago por los medicamentos por adelantado ya que esto es diferente dependiendo de la cobertura de su seguro. Sin embargo, es posible que podamos encontrar un medicamento sustituto a Electrical engineer un formulario para que el seguro cubra el medicamento que se considera necesario.   Si se requiere una autorizacin previa para que su compaa de seguros [place] su medicamento, por favor permtanos de 1 a 2 das hbiles para completar este proceso.  Los precios de los medicamentos varan con frecuencia dependiendo del Environmental consultant de dnde se surte la receta y alguna farmacias pueden ofrecer precios ms baratos.  El sitio web www.goodrx.com tiene cupones para medicamentos de Airline pilot. Los precios aqu no tienen en cuenta lo que podra costar con la ayuda del seguro (puede ser ms barato con su seguro), pero el sitio web puede darle el precio si no utiliz Conservation officer, historic buildings.  - Puede imprimir el cupn correspondiente y llevarlo con su receta a la farmacia.  - Tambin puede pasar por nuestra oficina durante el horario de atencin regular y Charity fundraiser una tarjeta de cupones de GoodRx.  - Si necesita que su receta se enve electrnicamente a una farmacia diferente, informe a nuestra oficina a travs de MyChart de [place] o por telfono llamando al [phone] y presione la opcin 4.

## 2021-05-24 NOTE — Progress Notes (Signed)
° °  Follow-Up Visit   Subjective  Katie Bell is a 81 y.o. female who presents for the following: Follow-up (Atopic neurodermatitis - rash is better but not gone/).  The following portions of the chart were reviewed this encounter and updated as appropriate:   Tobacco   Allergies   Meds   Problems   Med Hx   Surg Hx   Fam Hx      Review of Systems:  No other skin or systemic complaints except as noted in HPI or Assessment and Plan.  Objective  Well appearing patient in no apparent distress; mood and affect are within normal limits.  All skin waist up examined.  Xerosis  Right Upper Back Pink patch of right upper back   Assessment & Plan  Atopic dermatitis Right Upper Back Improved, but persistent. Atopic dermatitis (eczema) is a chronic, relapsing, pruritic condition that can significantly affect quality of life. It is often associated with allergic rhinitis and/or asthma and can require treatment with topical medications, phototherapy, or in severe cases biologic injectable medication (Dupixent; Adbry) or Oral JAK inhibitors.  Improved - Continue TMC 0.1% cream qd up to 5 days per weekto affected areas of rash.  triamcinolone cream (KENALOG) 0.1 % - Right Upper Back Apply 1 application topically as directed. Apply to aa rash on back, shoulders, chest, and legs bid for 2 weeks, then Qd 5 days per week  until clear, avoid face, groin, axilla  Xerosis cutis Recommend Cerave cream daily Mild soaps such as Dove sensitive skin cleanser.  Return in about 3 months (around 08/21/2021) for Atopic Dermatitis.  I, Ashok Cordia, CMA, am acting as scribe for Sarina Ser, MD . Documentation: I have reviewed the above documentation for accuracy and completeness, and I agree with the above.  Sarina Ser, MD

## 2021-05-26 ENCOUNTER — Other Ambulatory Visit: Payer: Self-pay

## 2021-05-26 ENCOUNTER — Ambulatory Visit (INDEPENDENT_AMBULATORY_CARE_PROVIDER_SITE_OTHER): Payer: Medicare HMO | Admitting: Internal Medicine

## 2021-05-26 ENCOUNTER — Encounter: Payer: Self-pay | Admitting: Internal Medicine

## 2021-05-26 DIAGNOSIS — I10 Essential (primary) hypertension: Secondary | ICD-10-CM | POA: Diagnosis not present

## 2021-05-26 DIAGNOSIS — F39 Unspecified mood [affective] disorder: Secondary | ICD-10-CM | POA: Diagnosis not present

## 2021-05-26 DIAGNOSIS — E441 Mild protein-calorie malnutrition: Secondary | ICD-10-CM | POA: Diagnosis not present

## 2021-05-26 NOTE — Progress Notes (Signed)
Subjective:    Patient ID: Katie Bell, female    DOB: 08-Oct-1940, 81 y.o.   MRN: [id number]  HPI Here for follow up of mood issues  Doing "better" Able to go to church every Sunday Gets out for shopping and out to eat with cousin (who usually drives) Has some social friends from church  No depression lately Enjoying going out Still worries about her son  No chest pain No SOB She gets shakes---if she is cold  Current Outpatient Medications on File Prior to Visit  Medication Sig Dispense Refill   amLODipine (NORVASC) 5 MG tablet TAKE 1 TABLET BY MOUTH ONCE DAILY 90 tablet 3   Cholecalciferol (VITAMIN D) 400 UNITS capsule Take 800 Units by mouth daily.      potassium chloride SA (KLOR-CON) 20 MEQ tablet TAKE 1 TABLET BY MOUTH ONCE DAILY 30 tablet 11   clotrimazole-betamethasone (LOTRISONE) cream Apply 1 application topically 2 (two) times daily. To groin and peri-anal area rash, for 1-2 weeks (until healed). (Patient not taking: Reported on 05/26/2021) 30 g 0   hydrocortisone 2.5 % cream Apply topically 3 (three) times daily as needed. To hemorrhoids (Patient not taking: Reported on 05/26/2021) 28 g 3   triamcinolone cream (KENALOG) 0.1 % Apply 1 application topically as directed. Apply to aa rash on back, shoulders, chest, and legs bid for 2 weeks, then Qd 5 days per week  until clear, avoid face, groin, axilla (Patient not taking: Reported on 05/26/2021) 454 g 1   No current facility-administered medications on file prior to visit.    Allergies  Allergen Reactions   Sulfamethoxazole-Trimethoprim Swelling    Angioedema and pruritis per 01/01/2017 documentation in care everywhere   Estradiol Other (See Comments)    Vaginal irritation Other reaction(s): Other (See Comments) Vaginal irritation   Morphine     agitation   Sertraline Hcl Other (See Comments)    Migraine headaches, severe   Morphine And Related Anxiety    Past Medical History:  Diagnosis Date   Anxiety     Cancer ([place]) 2015   basal cell removed from leg   Depression    Episodic mood disorder ([place])    mostly anxiety--depression in the past   Gastric ulcer    History   GERD (gastroesophageal reflux disease)    History of blood transfusion    At [facility] appro 15 yrs ago   History of hiatal hernia    History of kidney stones 1960   Hypertension    Malignant melanoma ([place]) 07/14/2014   Left proximal post lat thigh. Breslow's 0.22mm, Anatomic level II. Excised 08/03/2014   Osteoarthritis, hand    hand   Postmenopausal disorder    SVD (spontaneous vaginal delivery)    x 3    Past Surgical History:  Procedure Laterality Date   CATARACT EXTRACTION W/PHACO Right 02/12/2019   Procedure: CATARACT EXTRACTION PHACO AND INTRAOCULAR LENS PLACEMENT ([place]);  Surgeon: Marchia Meiers, MD;  Location: [facility];  Service: Ophthalmology;  Laterality: Right;  [place] 01:34.2 CDE 17.97 Fluid Pack lot # [id number]   CATARACT EXTRACTION W/PHACO Left 03/26/2019   Procedure: CATARACT EXTRACTION PHACO AND INTRAOCULAR LENS PLACEMENT ([place]) LEFT VISION BLUE;  Surgeon: Marchia Meiers, MD;  Location: [facility];  Service: Ophthalmology;  Laterality: Left;  [place] 01:16.2 CDE 11.70 FLUID PACK LOT # [id number]   CHOLECYSTECTOMY  2008   COLONOSCOPY  04/2011   CYSTOCELE REPAIR N/A 12/31/2013   Procedure: ANTERIOR REPAIR (CYSTOCELE);  Surgeon: Standley Dakins  Kennon Rounds, MD;  Location: [facility];  Service: Gynecology;  Laterality: N/A;   CYSTOSCOPY W/ URETERAL STENT PLACEMENT Left 01/16/2016   Procedure: CYSTOSCOPY WITH STENT REPLACEMENT;  Surgeon: Hollice Espy, MD;  Location: [facility];  Service: Urology;  Laterality: Left;   CYSTOSCOPY WITH STENT PLACEMENT Left 12/23/2015   Procedure: CYSTOSCOPY WITH STENT PLACEMENT;  Surgeon: Ardis Hughs, MD;  Location: [facility];  Service: Urology;  Laterality: Left;   DIAGNOSTIC LAPAROSCOPY     EYE SURGERY     kidney stone removed     MELANOMA EXCISION  2015   left posterior thigh    TONSILLECTOMY  1944   TUBAL LIGATION     UPPER GI ENDOSCOPY     URETEROSCOPY WITH HOLMIUM LASER LITHOTRIPSY Left 01/16/2016   Procedure: URETEROSCOPY WITH HOLMIUM LASER LITHOTRIPSY;  Surgeon: Hollice Espy, MD;  Location: [facility];  Service: Urology;  Laterality: Left;   WISDOM TOOTH EXTRACTION      Family History  Problem Relation Age of Onset   Alzheimer's disease Mother    Alcohol abuse Father    COPD Brother    Heart disease Neg Hx    Cancer Neg Hx     Social History   Socioeconomic History   Marital status: Divorced    Spouse name: Not on file   Number of children: 3   Years of education: Not on file   Highest education level: Not on file  Occupational History   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 Then son [name]   Would accept resuscitation attempts but no prolonged ventilation   No tube feeds if cognitively unaware      Social Determinants of Health   Financial Resource Strain: Not on file  Food Insecurity: Not on file  Transportation Needs: Not on file  Physical Activity: Not on file  Stress: Not on file  Social Connections: Not on file  Intimate Partner Violence: Not on file   Review of Systems Appetite is okay Weight up slightly Some trouble initiating sleep--nothing new. Does okay after getting to sleep usually Some joint pains---tylenol helps    Objective:   Physical Exam Constitutional:      Appearance: Normal appearance.  Cardiovascular:     Rate and Rhythm: Normal rate and regular rhythm.     Heart  sounds: No murmur heard.   No gallop.  Pulmonary:     Effort: Pulmonary effort is normal.     Breath sounds: Normal breath sounds. No wheezing or rales.  Musculoskeletal:     Cervical back: Neck supple.     Right lower leg: No edema.     Left lower leg: No edema.  Lymphadenopathy:     Cervical: No cervical adenopathy.  Neurological:     Mental Status: She is alert.  Psychiatric:        Mood and Affect: Mood normal.        Behavior: Behavior normal.  Assessment & Plan:

## 2021-05-26 NOTE — Assessment & Plan Note (Signed)
Did regain some weight---likely from going out to eat with cousin, etc Had considered mirtazapine 7.5mg  at bedtime---will hold off unless she loses again

## 2021-05-26 NOTE — Assessment & Plan Note (Signed)
Chronic anxiety Depression with social isolation from COVID---this seems better Had adverse reaction with the valium---doing okay without anything

## 2021-05-26 NOTE — Assessment & Plan Note (Signed)
BP Readings from Last 3 Encounters:  05/26/21 118/78  04/26/21 (!) 185/81  12/28/20 (!) 154/63   Controlled on amlodpine 5mg  Had been elevated in the ER

## 2021-05-27 ENCOUNTER — Encounter: Payer: Self-pay | Admitting: Dermatology

## 2021-08-10 ENCOUNTER — Ambulatory Visit (INDEPENDENT_AMBULATORY_CARE_PROVIDER_SITE_OTHER): Payer: Medicare HMO | Admitting: Internal Medicine

## 2021-08-10 ENCOUNTER — Encounter: Payer: Self-pay | Admitting: Internal Medicine

## 2021-08-10 VITALS — BP 148/90 | HR 87 | Ht 60.0 in | Wt 110.8 lb

## 2021-08-10 DIAGNOSIS — H9313 Tinnitus, bilateral: Secondary | ICD-10-CM

## 2021-08-10 DIAGNOSIS — F39 Unspecified mood [affective] disorder: Secondary | ICD-10-CM

## 2021-08-10 DIAGNOSIS — H9319 Tinnitus, unspecified ear: Secondary | ICD-10-CM | POA: Insufficient documentation

## 2021-08-10 NOTE — Progress Notes (Signed)
? ?Subjective:  ? ? Patient ID: Katie Bell, female    DOB: 10/25/1940, 81 y.o.   MRN: [id number] ? ?HPI ?Here due to ringing in her ears ? ?Having ringing for 2-3 weeks--more in right ear ?"I can hear it jumping around in there" ?Also with headache--and down into neck---but may be related to anxiety ? ?Had slight vertigo-- but only once in a while ?No change in hearing--seems to be okay ? ?Current Outpatient Medications on File Prior to Visit  ?Medication Sig Dispense Refill  ? amLODipine (NORVASC) 5 MG tablet TAKE 1 TABLET BY MOUTH ONCE DAILY 90 tablet 3  ? Cholecalciferol (VITAMIN D) 400 UNITS capsule Take 800 Units by mouth daily.     ? clotrimazole-betamethasone (LOTRISONE) cream Apply 1 application topically 2 (two) times daily. To groin and peri-anal area rash, for 1-2 weeks (until healed). 30 g 0  ? hydrocortisone 2.5 % cream Apply topically 3 (three) times daily as needed. To hemorrhoids 28 g 3  ? potassium chloride SA (KLOR-CON) 20 MEQ tablet TAKE 1 TABLET BY MOUTH ONCE DAILY 30 tablet 11  ? triamcinolone cream (KENALOG) 0.1 % Apply 1 application topically as directed. Apply to aa rash on back, shoulders, chest, and legs bid for 2 weeks, then Qd 5 days per week  until clear, avoid face, groin, axilla 454 g 1  ? ?No current facility-administered medications on file prior to visit.  ? ? ?Allergies  ?Allergen Reactions  ? Sulfamethoxazole-Trimethoprim Swelling  ?  Angioedema and pruritis per 01/01/2017 documentation in care everywhere  ? Estradiol Other (See Comments)  ?  Vaginal irritation ?Other reaction(s): Other (See Comments) ?Vaginal irritation  ? Morphine   ?  agitation  ? Sertraline Hcl Other (See Comments)  ?  Migraine headaches, severe  ? Morphine And Related Anxiety  ? ? ?Past Medical History:  ?Diagnosis Date  ? Anxiety   ? Cancer [facility]) 2015  ? basal cell removed from leg  ? Depression   ? Episodic mood disorder ([place])   ? mostly anxiety--depression in the past  ? Gastric ulcer   ? History  ?  GERD (gastroesophageal reflux disease)   ? History of blood transfusion   ? At [facility] appro 15 yrs ago  ? History of hiatal hernia   ? History of kidney stones 1960  ? Hypertension   ? Malignant melanoma ([place]) 07/14/2014  ? Left proximal post lat thigh. Breslow's 0.70m, Anatomic level II. Excised 08/03/2014  ? Osteoarthritis, hand   ? hand  ? Postmenopausal disorder   ? SVD (spontaneous vaginal delivery)   ? x 3  ? ? ?Past Surgical History:  ?Procedure Laterality Date  ? CATARACT EXTRACTION W/PHACO Right 02/12/2019  ? Procedure: CATARACT EXTRACTION PHACO AND INTRAOCULAR LENS PLACEMENT ([facility]);  Surgeon: HMarchia Meiers MD;  Location: [facility];  Service: Ophthalmology;  Laterality: Right;  [place]34.2 ?CDE 17.97 ?Fluid Pack lot # [id number]  ? CATARACT EXTRACTION W/PHACO Left 03/26/2019  ? Procedure: CATARACT EXTRACTION PHACO AND INTRAOCULAR LENS PLACEMENT ([place] LEFT VISION BLUE;  Surgeon: HMarchia Meiers MD;  Location: [facility];  Service: Ophthalmology;  Laterality: Left;  [place]01:16.2 ?CDE 11.70 ?FLUID PACK LOT # [id number]  ? CHOLECYSTECTOMY  2008  ? COLONOSCOPY  04/2011  ? CYSTOCELE REPAIR N/A 12/31/2013  ? Procedure: ANTERIOR REPAIR (CYSTOCELE);  Surgeon: TDonnamae Jude MD;  [facility];  Service: Gynecology;  Laterality: N/A;  ? CYSTOSCOPY W/ URETERAL STENT PLACEMENT Left 01/16/2016  ? Procedure: CYSTOSCOPY WITH  STENT REPLACEMENT;  Surgeon: Hollice Espy, MD;  Location: [facility];  Service: Urology;  Laterality: Left;  ? CYSTOSCOPY WITH STENT PLACEMENT Left 12/23/2015  ? Procedure: CYSTOSCOPY WITH STENT PLACEMENT;  Surgeon: Ardis Hughs, MD;  Location: [facility];  Service: Urology;  Laterality: Left;  ? DIAGNOSTIC LAPAROSCOPY    ? EYE SURGERY    ? kidney stone removed    ? MELANOMA EXCISION  2015  ? left posterior thigh  ? TONSILLECTOMY  1944  ? TUBAL LIGATION    ? UPPER GI ENDOSCOPY    ? URETEROSCOPY WITH HOLMIUM LASER LITHOTRIPSY Left 01/16/2016  ? Procedure: URETEROSCOPY WITH HOLMIUM LASER  LITHOTRIPSY;  Surgeon: Hollice Espy, MD;  Location: [facility];  Service: Urology;  Laterality: Left;  ? WISDOM TOOTH EXTRACTION    ? ? ?Family History  ?Problem Relation Age of Onset  ? Alzheimer's disease Mother   ? Alcohol abuse Father   ? COPD Brother   ? Heart disease Neg Hx   ? Cancer Neg Hx   ? ? ?Social History  ? ?Socioeconomic History  ? Marital status: Divorced  ?  Spouse name: Not on file  ? Number of children: 3  ? Years of education: Not on file  ? Highest education level: Not on file  ?Occupational History  ? 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 Smoking status: Former  ? Smokeless tobacco: Never  ?Substance and Sexual Activity  ? Alcohol use: No  ?  Alcohol/week: 0.0 standard drinks  ? Drug use: No  ? Sexual activity: Yes  ?  Birth control/protection: Post-menopausal  ?Other Topics Concern  ? Not on file  ?Social History Narrative  ? 3 sons--- middle one lives with her  ? [name]  ?   ? Has living will  ? Son [name]. Then son [name]  ? Would accept resuscitation attempts but no prolonged ventilation  ? No tube feeds if cognitively unaware  ?   ? ?Social Determinants of Health  ? ?Financial Resource Strain: Not on file  ?Food Insecurity: Not on file  ?Transportation Needs: Not on file  ?Physical Activity: Not on file  ?Stress: Not on file  ?Social Connections: Not on file  ?Intimate Partner Violence: Not on file  ? ?Review of Systems ?No fever ?Not feeling sick--but not feeling right (with these symptoms) ?   ?Objective:  ? Physical Exam ?Constitutional:   ?   Appearance: Normal appearance.  ?HENT:  ?   Right Ear: Tympanic membrane and ear canal normal.  ?   Left Ear: Tympanic membrane and ear canal normal.  ?Musculoskeletal:  ?   Right lower leg: No edema.  ?   Left lower leg: No edema.  ?Neurological:  ?   Mental Status: She is alert and oriented to person, place, and time.  ?   Cranial Nerves: No facial asymmetry.  ?    Motor: No weakness.  ?   Coordination: Romberg sign negative. Coordination normal.  ?   Gait: Gait normal.  ?  ? ? ? ? ?   ?Assessment & Plan:  ? ?

## 2021-08-10 NOTE — Assessment & Plan Note (Signed)
No clear or persistent vertigo ?Normal (though slow) gait ?Nothing to suggest intracranial process ?Will set up with ENT---but generally reassured that things look okay ?

## 2021-08-10 NOTE — Assessment & Plan Note (Signed)
Very anxious now ?Upset about son who lives with her---unemployed/won't try ?Discussed that this is why her BP is up--and likely the reason for the headache ?Counseled about issues with her son ?

## 2021-09-11 ENCOUNTER — Ambulatory Visit: Payer: Medicare HMO | Admitting: Dermatology

## 2021-09-28 ENCOUNTER — Other Ambulatory Visit: Payer: Self-pay | Admitting: Otolaryngology

## 2021-09-28 DIAGNOSIS — H903 Sensorineural hearing loss, bilateral: Secondary | ICD-10-CM | POA: Diagnosis not present

## 2021-09-28 DIAGNOSIS — H9313 Tinnitus, bilateral: Secondary | ICD-10-CM | POA: Diagnosis not present

## 2021-09-28 DIAGNOSIS — H918X9 Other specified hearing loss, unspecified ear: Secondary | ICD-10-CM

## 2021-11-08 ENCOUNTER — Ambulatory Visit: Payer: Medicare HMO | Admitting: Dermatology

## 2021-11-29 ENCOUNTER — Encounter: Payer: Medicare HMO | Admitting: Internal Medicine

## 2021-12-06 ENCOUNTER — Other Ambulatory Visit: Payer: Self-pay | Admitting: Internal Medicine

## 2021-12-06 DIAGNOSIS — E876 Hypokalemia: Secondary | ICD-10-CM

## 2022-01-24 ENCOUNTER — Other Ambulatory Visit: Payer: Self-pay

## 2022-01-24 ENCOUNTER — Emergency Department: Payer: Medicare HMO

## 2022-01-24 ENCOUNTER — Inpatient Hospital Stay: Payer: Medicare HMO

## 2022-01-24 ENCOUNTER — Inpatient Hospital Stay
Admission: EM | Admit: 2022-01-24 | Discharge: 2022-02-09 | DRG: 480 | Disposition: A | Discharge status: Skilled Nursing Facility | Payer: Medicare HMO | Attending: Internal Medicine | Admitting: Internal Medicine

## 2022-01-24 DIAGNOSIS — R1310 Dysphagia, unspecified: Secondary | ICD-10-CM | POA: Diagnosis not present

## 2022-01-24 DIAGNOSIS — I82442 Acute embolism and thrombosis of left tibial vein: Secondary | ICD-10-CM | POA: Diagnosis not present

## 2022-01-24 DIAGNOSIS — R5381 Other malaise: Secondary | ICD-10-CM | POA: Diagnosis not present

## 2022-01-24 DIAGNOSIS — S72141A Displaced intertrochanteric fracture of right femur, initial encounter for closed fracture: Principal | ICD-10-CM | POA: Diagnosis present

## 2022-01-24 DIAGNOSIS — R410 Disorientation, unspecified: Secondary | ICD-10-CM | POA: Diagnosis not present

## 2022-01-24 DIAGNOSIS — R000 Tachycardia, unspecified: Secondary | ICD-10-CM | POA: Diagnosis not present

## 2022-01-24 DIAGNOSIS — S72001A Fracture of unspecified part of neck of right femur, initial encounter for closed fracture: Secondary | ICD-10-CM

## 2022-01-24 DIAGNOSIS — D72823 Leukemoid reaction: Secondary | ICD-10-CM | POA: Diagnosis not present

## 2022-01-24 DIAGNOSIS — I6523 Occlusion and stenosis of bilateral carotid arteries: Secondary | ICD-10-CM | POA: Diagnosis not present

## 2022-01-24 DIAGNOSIS — I38 Endocarditis, valve unspecified: Secondary | ICD-10-CM | POA: Diagnosis not present

## 2022-01-24 DIAGNOSIS — R29818 Other symptoms and signs involving the nervous system: Secondary | ICD-10-CM | POA: Diagnosis not present

## 2022-01-24 DIAGNOSIS — Z87442 Personal history of urinary calculi: Secondary | ICD-10-CM

## 2022-01-24 DIAGNOSIS — R41841 Cognitive communication deficit: Secondary | ICD-10-CM | POA: Diagnosis not present

## 2022-01-24 DIAGNOSIS — Z79899 Other long term (current) drug therapy: Secondary | ICD-10-CM

## 2022-01-24 DIAGNOSIS — R4781 Slurred speech: Secondary | ICD-10-CM | POA: Diagnosis not present

## 2022-01-24 DIAGNOSIS — R29898 Other symptoms and signs involving the musculoskeletal system: Secondary | ICD-10-CM | POA: Diagnosis not present

## 2022-01-24 DIAGNOSIS — Z87891 Personal history of nicotine dependence: Secondary | ICD-10-CM

## 2022-01-24 DIAGNOSIS — I82443 Acute embolism and thrombosis of tibial vein, bilateral: Secondary | ICD-10-CM | POA: Diagnosis not present

## 2022-01-24 DIAGNOSIS — S51011A Laceration without foreign body of right elbow, initial encounter: Secondary | ICD-10-CM | POA: Diagnosis present

## 2022-01-24 DIAGNOSIS — A419 Sepsis, unspecified organism: Secondary | ICD-10-CM

## 2022-01-24 DIAGNOSIS — S72144D Nondisplaced intertrochanteric fracture of right femur, subsequent encounter for closed fracture with routine healing: Secondary | ICD-10-CM | POA: Diagnosis not present

## 2022-01-24 DIAGNOSIS — N179 Acute kidney failure, unspecified: Secondary | ICD-10-CM | POA: Diagnosis not present

## 2022-01-24 DIAGNOSIS — R339 Retention of urine, unspecified: Secondary | ICD-10-CM | POA: Diagnosis not present

## 2022-01-24 DIAGNOSIS — I619 Nontraumatic intracerebral hemorrhage, unspecified: Secondary | ICD-10-CM | POA: Diagnosis not present

## 2022-01-24 DIAGNOSIS — R6520 Severe sepsis without septic shock: Secondary | ICD-10-CM | POA: Diagnosis not present

## 2022-01-24 DIAGNOSIS — I82451 Acute embolism and thrombosis of right peroneal vein: Secondary | ICD-10-CM | POA: Diagnosis not present

## 2022-01-24 DIAGNOSIS — I824Z3 Acute embolism and thrombosis of unspecified deep veins of distal lower extremity, bilateral: Secondary | ICD-10-CM | POA: Diagnosis not present

## 2022-01-24 DIAGNOSIS — K219 Gastro-esophageal reflux disease without esophagitis: Secondary | ICD-10-CM | POA: Diagnosis present

## 2022-01-24 DIAGNOSIS — M6259 Muscle wasting and atrophy, not elsewhere classified, multiple sites: Secondary | ICD-10-CM | POA: Diagnosis not present

## 2022-01-24 DIAGNOSIS — M19049 Primary osteoarthritis, unspecified hand: Secondary | ICD-10-CM | POA: Diagnosis present

## 2022-01-24 DIAGNOSIS — I6340 Cerebral infarction due to embolism of unspecified cerebral artery: Secondary | ICD-10-CM | POA: Diagnosis not present

## 2022-01-24 DIAGNOSIS — Z452 Encounter for adjustment and management of vascular access device: Secondary | ICD-10-CM | POA: Diagnosis not present

## 2022-01-24 DIAGNOSIS — H5703 Miosis: Secondary | ICD-10-CM | POA: Diagnosis not present

## 2022-01-24 DIAGNOSIS — I10 Essential (primary) hypertension: Secondary | ICD-10-CM | POA: Diagnosis present

## 2022-01-24 DIAGNOSIS — R0602 Shortness of breath: Secondary | ICD-10-CM | POA: Diagnosis not present

## 2022-01-24 DIAGNOSIS — D62 Acute posthemorrhagic anemia: Secondary | ICD-10-CM | POA: Diagnosis not present

## 2022-01-24 DIAGNOSIS — I63413 Cerebral infarction due to embolism of bilateral middle cerebral arteries: Secondary | ICD-10-CM | POA: Diagnosis not present

## 2022-01-24 DIAGNOSIS — J9601 Acute respiratory failure with hypoxia: Secondary | ICD-10-CM | POA: Diagnosis not present

## 2022-01-24 DIAGNOSIS — E43 Unspecified severe protein-calorie malnutrition: Secondary | ICD-10-CM | POA: Diagnosis not present

## 2022-01-24 DIAGNOSIS — R4182 Altered mental status, unspecified: Secondary | ICD-10-CM | POA: Diagnosis not present

## 2022-01-24 DIAGNOSIS — G936 Cerebral edema: Secondary | ICD-10-CM | POA: Diagnosis not present

## 2022-01-24 DIAGNOSIS — E872 Acidosis, unspecified: Secondary | ICD-10-CM | POA: Diagnosis not present

## 2022-01-24 DIAGNOSIS — D72829 Elevated white blood cell count, unspecified: Secondary | ICD-10-CM | POA: Diagnosis present

## 2022-01-24 DIAGNOSIS — D696 Thrombocytopenia, unspecified: Secondary | ICD-10-CM | POA: Diagnosis not present

## 2022-01-24 DIAGNOSIS — I639 Cerebral infarction, unspecified: Secondary | ICD-10-CM | POA: Diagnosis not present

## 2022-01-24 DIAGNOSIS — T8484XD Pain due to internal orthopedic prosthetic devices, implants and grafts, subsequent encounter: Secondary | ICD-10-CM | POA: Diagnosis not present

## 2022-01-24 DIAGNOSIS — Y92009 Unspecified place in unspecified non-institutional (private) residence as the place of occurrence of the external cause: Secondary | ICD-10-CM

## 2022-01-24 DIAGNOSIS — Z681 Body mass index (BMI) 19 or less, adult: Secondary | ICD-10-CM

## 2022-01-24 DIAGNOSIS — E860 Dehydration: Secondary | ICD-10-CM | POA: Diagnosis not present

## 2022-01-24 DIAGNOSIS — I82412 Acute embolism and thrombosis of left femoral vein: Secondary | ICD-10-CM | POA: Diagnosis not present

## 2022-01-24 DIAGNOSIS — D72825 Bandemia: Secondary | ICD-10-CM | POA: Diagnosis not present

## 2022-01-24 DIAGNOSIS — R7881 Bacteremia: Secondary | ICD-10-CM

## 2022-01-24 DIAGNOSIS — Z881 Allergy status to other antibiotic agents status: Secondary | ICD-10-CM

## 2022-01-24 DIAGNOSIS — S199XXA Unspecified injury of neck, initial encounter: Secondary | ICD-10-CM | POA: Diagnosis not present

## 2022-01-24 DIAGNOSIS — A4159 Other Gram-negative sepsis: Secondary | ICD-10-CM | POA: Diagnosis not present

## 2022-01-24 DIAGNOSIS — W109XXA Fall (on) (from) unspecified stairs and steps, initial encounter: Secondary | ICD-10-CM | POA: Diagnosis present

## 2022-01-24 DIAGNOSIS — I6389 Other cerebral infarction: Secondary | ICD-10-CM | POA: Diagnosis not present

## 2022-01-24 DIAGNOSIS — R0689 Other abnormalities of breathing: Secondary | ICD-10-CM | POA: Diagnosis not present

## 2022-01-24 DIAGNOSIS — B961 Klebsiella pneumoniae [K. pneumoniae] as the cause of diseases classified elsewhere: Secondary | ICD-10-CM

## 2022-01-24 DIAGNOSIS — M6281 Muscle weakness (generalized): Secondary | ICD-10-CM | POA: Diagnosis not present

## 2022-01-24 DIAGNOSIS — Z888 Allergy status to other drugs, medicaments and biological substances status: Secondary | ICD-10-CM

## 2022-01-24 DIAGNOSIS — T7840XA Allergy, unspecified, initial encounter: Secondary | ICD-10-CM | POA: Diagnosis not present

## 2022-01-24 DIAGNOSIS — E876 Hypokalemia: Secondary | ICD-10-CM | POA: Diagnosis not present

## 2022-01-24 DIAGNOSIS — S7291XA Unspecified fracture of right femur, initial encounter for closed fracture: Secondary | ICD-10-CM | POA: Diagnosis present

## 2022-01-24 DIAGNOSIS — Z8673 Personal history of transient ischemic attack (TIA), and cerebral infarction without residual deficits: Secondary | ICD-10-CM | POA: Diagnosis not present

## 2022-01-24 DIAGNOSIS — Z85828 Personal history of other malignant neoplasm of skin: Secondary | ICD-10-CM

## 2022-01-24 DIAGNOSIS — Z85820 Personal history of malignant melanoma of skin: Secondary | ICD-10-CM

## 2022-01-24 DIAGNOSIS — S0990XA Unspecified injury of head, initial encounter: Secondary | ICD-10-CM | POA: Diagnosis not present

## 2022-01-24 DIAGNOSIS — I69391 Dysphagia following cerebral infarction: Secondary | ICD-10-CM | POA: Diagnosis not present

## 2022-01-24 DIAGNOSIS — W19XXXA Unspecified fall, initial encounter: Secondary | ICD-10-CM | POA: Diagnosis not present

## 2022-01-24 DIAGNOSIS — Z8711 Personal history of peptic ulcer disease: Secondary | ICD-10-CM

## 2022-01-24 DIAGNOSIS — K449 Diaphragmatic hernia without obstruction or gangrene: Secondary | ICD-10-CM | POA: Diagnosis not present

## 2022-01-24 DIAGNOSIS — Z885 Allergy status to narcotic agent status: Secondary | ICD-10-CM

## 2022-01-24 DIAGNOSIS — Z7401 Bed confinement status: Secondary | ICD-10-CM | POA: Diagnosis not present

## 2022-01-24 DIAGNOSIS — R079 Chest pain, unspecified: Secondary | ICD-10-CM | POA: Diagnosis not present

## 2022-01-24 DIAGNOSIS — M25551 Pain in right hip: Secondary | ICD-10-CM | POA: Diagnosis not present

## 2022-01-24 LAB — COMPREHENSIVE METABOLIC PANEL
ALT: 16 U/L (ref 0–44)
AST: 29 U/L (ref 15–41)
Albumin: 4.2 g/dL (ref 3.5–5.0)
Alkaline Phosphatase: 51 U/L (ref 38–126)
Anion gap: 12 (ref 5–15)
BUN: 16 mg/dL (ref 8–23)
CO2: 20 mmol/L — ABNORMAL LOW (ref 22–32)
Calcium: 9.3 mg/dL (ref 8.9–10.3)
Chloride: 106 mmol/L (ref 98–111)
Creatinine, Ser: 0.88 mg/dL (ref 0.44–1.00)
GFR, Estimated: >60 mL/min (ref >60)
Glucose, Bld: 184 mg/dL — ABNORMAL HIGH (ref 70–99)
Potassium: 3.2 mmol/L — ABNORMAL LOW (ref 3.5–5.1)
Sodium: 138 mmol/L (ref 135–145)
Total Bilirubin: 1 mg/dL (ref 0.3–1.2)
Total Protein: 7.4 g/dL (ref 6.5–8.1)

## 2022-01-24 LAB — CBC WITH DIFFERENTIAL/PLATELET
Abs Immature Granulocytes: 0.08 10*3/uL — ABNORMAL HIGH (ref 0.00–0.07)
Basophils Absolute: 0.1 10*3/uL (ref 0.0–0.1)
Basophils Relative: 0 %
Eosinophils Absolute: 0 10*3/uL (ref 0.0–0.5)
Eosinophils Relative: 0 %
HCT: 39.9 % (ref 36.0–46.0)
Hemoglobin: 13.7 g/dL (ref 12.0–15.0)
Immature Granulocytes: 1 %
Lymphocytes Relative: 9 %
Lymphs Abs: 1.1 10*3/uL (ref 0.7–4.0)
MCH: 31.9 pg (ref 26.0–34.0)
MCHC: 34.3 g/dL (ref 30.0–36.0)
MCV: 93 fL (ref 80.0–100.0)
Monocytes Absolute: 0.6 10*3/uL (ref 0.1–1.0)
Monocytes Relative: 5 %
Neutro Abs: 10 10*3/uL — ABNORMAL HIGH (ref 1.7–7.7)
Neutrophils Relative %: 85 %
Platelets: 192 10*3/uL (ref 150–400)
RBC: 4.29 MIL/uL (ref 3.87–5.11)
RDW: 12.2 % (ref 11.5–15.5)
WBC: 11.8 10*3/uL — ABNORMAL HIGH (ref 4.0–10.5)
nRBC: 0 % (ref 0.0–0.2)

## 2022-01-24 MED ORDER — FENTANYL CITRATE PF 50 MCG/ML IJ SOSY: 50.0000 ug | PREFILLED_SYRINGE | Freq: Once | INTRAMUSCULAR | Status: AC

## 2022-01-24 MED ORDER — FENTANYL CITRATE PF 50 MCG/ML IJ SOSY
PREFILLED_SYRINGE | INTRAMUSCULAR | Status: AC
  Administered 2022-01-24: 50 ug via INTRAVENOUS
  Filled 2022-01-24: qty 1

## 2022-01-24 MED ORDER — CEFAZOLIN SODIUM-DEXTROSE 1-4 GM/50ML-% IV SOLN
1.0000 g | INTRAVENOUS | Status: AC
  Administered 2022-01-25: 2 g via INTRAVENOUS
  Filled 2022-01-24: qty 50

## 2022-01-24 MED ORDER — HYDROMORPHONE HCL 1 MG/ML IJ SOLN
0.5000 mg | Freq: Once | INTRAMUSCULAR | Status: AC
  Administered 2022-01-24: 0.5 mg via INTRAVENOUS
  Filled 2022-01-24: qty 0.5

## 2022-01-24 MED ORDER — HYDROMORPHONE HCL 1 MG/ML IJ SOLN
1.0000 mg | INTRAMUSCULAR | Status: DC | PRN
  Administered 2022-01-24 – 2022-01-25 (×3): 1 mg via INTRAVENOUS
  Filled 2022-01-24 (×3): qty 1

## 2022-01-24 MED ORDER — FENTANYL CITRATE PF 50 MCG/ML IJ SOSY
50.0000 ug | PREFILLED_SYRINGE | Freq: Once | INTRAMUSCULAR | Status: AC
  Administered 2022-01-24: 50 ug via INTRAVENOUS
  Filled 2022-01-24: qty 1

## 2022-01-24 NOTE — ED Notes (Signed)
Katie Bell (son): [phone] asks to be called with any update on his mother

## 2022-01-24 NOTE — H&P (Signed)
History and Physical    Patient: Katie Bell ZSW:[id number] DOB: May 17, 1940 DOA: 01/24/2022 DOS: the patient was seen and examined on 01/24/2022 PCP: Venia Carbon, MD  Patient coming from: Home  Chief Complaint:  Chief Complaint  Patient presents with   Fall   HPI: Katie Bell is a 81 y.o. female with medical history significant of essential hypertension, anxiety disorder, previous history of basal cell carcinoma, GERD and kidney stones who presents to the ER after sustaining a mechanical fall at home.  Patient apparently slipped and fell down her stairs.  Sustained pain on the right hip with noticeable deformity.  Did not hit her head.  No evidence of other bodily injuries.  Patient brought to the ER where she was noted to have right intertrochanteric fracture.  Orthopedics consulted and plan to do operative repair tomorrow.  Patient being admitted to the medical service.  She has no new complaints.  Pain is well controlled for the most part.  Currently down to 6 out of 10 after pain medications.  Otherwise no new complaint.  Review of Systems: As mentioned in the history of present illness. All other systems reviewed and are negative. Past Medical History:  Diagnosis Date   Anxiety    Cancer ([place]) 2015   basal cell removed from leg   Depression    Episodic mood disorder ([place])    mostly anxiety--depression in the past   Gastric ulcer    History   GERD (gastroesophageal reflux disease)    History of blood transfusion    At [facility] appro 15 yrs ago   History of hiatal hernia    History of kidney stones 1960   Hypertension    Malignant melanoma ([place]) 07/14/2014   Left proximal post lat thigh. Breslow's 0.72m, Anatomic level II. Excised 08/03/2014   Osteoarthritis, hand    hand   Postmenopausal disorder    SVD (spontaneous vaginal delivery)    x 3   Past Surgical History:  Procedure Laterality Date   CATARACT EXTRACTION W/PHACO Right 02/12/2019    Procedure: CATARACT EXTRACTION PHACO AND INTRAOCULAR LENS PLACEMENT ([place];  Surgeon: HMarchia Meiers MD;  Location: [facility] ORS;  Service: Ophthalmology;  Laterality: Right;  [place]:34.2 CDE 17.97 Fluid Pack lot # [id number]   CATARACT EXTRACTION W/PHACO Left 03/26/2019   Procedure: CATARACT EXTRACTION PHACO AND INTRAOCULAR LENS PLACEMENT ([place] LEFT VISION BLUE;  Surgeon: HMarchia Meiers MD;  Location: [facility] ORS;  Service: Ophthalmology;  Laterality: Left;  [place]:16.2 CDE 11.70 FLUID PACK LOT # [id number]   CHOLECYSTECTOMY  2008   COLONOSCOPY  04/2011   CYSTOCELE REPAIR N/A 12/31/2013   Procedure: ANTERIOR REPAIR (CYSTOCELE);  Surgeon: TDonnamae Jude MD;  [facility];  Service: Gynecology;  Laterality: N/A;   CYSTOSCOPY W/ URETERAL STENT PLACEMENT Left 01/16/2016   Procedure: CYSTOSCOPY WITH STENT REPLACEMENT;  Surgeon: AHollice Espy MD;  Location: [facility] ORS;  Service: Urology;  Laterality: Left;   CYSTOSCOPY WITH STENT PLACEMENT Left 12/23/2015   Procedure: CYSTOSCOPY WITH STENT PLACEMENT;  Surgeon: BArdis Hughs MD;  Location: [facility];  Service: Urology;  Laterality: Left;   DIAGNOSTIC LAPAROSCOPY     EYE SURGERY     kidney stone removed     MELANOMA EXCISION  2015   left posterior thigh   TONSILLECTOMY  1944   TUBAL LIGATION     UPPER GI ENDOSCOPY     URETEROSCOPY WITH HOLMIUM LASER LITHOTRIPSY Left 01/16/2016   Procedure: URETEROSCOPY WITH HOLMIUM  LASER LITHOTRIPSY;  Surgeon: Hollice Espy, MD;  Location: [facility];  Service: Urology;  Laterality: Left;   WISDOM TOOTH EXTRACTION     Social History:  reports that she has quit smoking. She has never used smokeless tobacco. She reports that she does not drink alcohol and does not use drugs.  Allergies  Allergen Reactions   Sulfamethoxazole-Trimethoprim Swelling    Angioedema and pruritis per 01/01/2017 documentation in care everywhere   Estradiol Other (See Comments)    Vaginal irritation Other reaction(s): Other (See  Comments) Vaginal irritation   Morphine     agitation   Sertraline Hcl Other (See Comments)    Migraine headaches, severe   Morphine And Related Anxiety    Family History  Problem Relation Age of Onset   Alzheimer's disease Mother    Alcohol abuse Father    COPD Brother    Heart disease Neg Hx    Cancer Neg Hx     Prior to Admission medications   Medication Sig Start Date End Date Taking? Authorizing Provider  amLODipine (NORVASC) 5 MG tablet TAKE 1 TABLET BY MOUTH ONCE DAILY 12/06/21  Yes Venia Carbon, MD  Cholecalciferol (VITAMIN D) 400 UNITS capsule Take 800 Units by mouth daily.    Yes [provider]  potassium chloride SA (KLOR-CON M) 20 MEQ tablet TAKE 1 TABLET BY MOUTH ONCE DAILY 12/06/21  Yes Venia Carbon, MD    Physical Exam: Vitals:   01/24/22 1614 01/24/22 1615 01/24/22 1621 01/24/22 1621  BP:  (!) 166/75    Pulse:   81   Resp:   (!) 22   Temp:    98 F (36.7 C)  SpO2:   97%   Weight: 49.9 kg     Height: 5' (1.524 m)      Constitutional: NAD, calm, comfortable Eyes: PERRL, lids and conjunctivae normal ENMT: Mucous membranes are moist. Posterior pharynx clear of any exudate or lesions.Normal dentition.  Neck: normal, supple, no masses, no thyromegaly Respiratory: clear to auscultation bilaterally, no wheezing, no crackles. Normal respiratory effort. No accessory muscle use.  Cardiovascular: Regular rate and rhythm, no murmurs / rubs / gallops. No extremity edema. 2+ pedal pulses. No carotid bruits.  Abdomen: no tenderness, no masses palpated. No hepatosplenomegaly. Bowel sounds positive.  Musculoskeletal: Right lower extremity laterally rotated and shortened, good pulses Skin: no rashes, lesions, ulcers. No induration Neurologic: CN 2-12 grossly intact. Sensation intact, DTR normal. Strength 5/5 in all 4.  Psychiatric: Normal judgment and insight. Alert and oriented x 3. Normal mood  Data Reviewed:  Potassium is 3.2, glucose 184, white  count 11.8.  CT cervical spine and head CT without contrast so far negative.  Chest x-ray showed no acute findings.  X-ray of the hip showed mildly comminuted intertrochanteric right femur fracture.  Assessment and Plan:   #1 right intertrochanteric fracture: Secondary to mechanical fall.  Patient appears to be medically stable for any surgical intervention.  Patient will be admitted.  Pain management.  N.p.o. after midnight.  We will follow pre and postoperatively.  #2 essential hypertension: Patient is on amlodipine at home.  We will resume blood pressure medications.  #3 hypokalemia: We will replete potassium.  #4 leukocytosis: Possibly secondary to inflammation.  Monitor the white count.  #5 GERD: Not on PPIs.  We will follow-up monitor.     Advance Care Planning:   Code Status: Prior full code  Consults: Dr. Sharlet Salina, orthopedics  Family Communication: Daughter over the phone  Severity  of Illness: The appropriate patient status for this patient is INPATIENT. Inpatient status is judged to be reasonable and necessary in order to provide the required intensity of service to ensure the patient's safety. The patient's presenting symptoms, physical exam findings, and initial radiographic and laboratory data in the context of their chronic comorbidities is felt to place them at high risk for further clinical deterioration. Furthermore, it is not anticipated that the patient will be medically stable for discharge from the hospital within 2 midnights of admission.   * I certify that at the point of admission it is my clinical judgment that the patient will require inpatient hospital care spanning beyond 2 midnights from the point of admission due to high intensity of service, high risk for further deterioration and high frequency of surveillance required.*  AuthorBarbette Merino, MD 01/24/2022 7:16 PM  For on call review [email or web address].

## 2022-01-24 NOTE — ED Triage Notes (Signed)
Pt BIB [facility] for fall. Pt fell backwards off of first stair. No thinners, did not hit head, no LOC. 159mg fentanyl with EMS. Obvious deformity to R hip.

## 2022-01-24 NOTE — ED Provider Notes (Signed)
[facility] Provider Note    Event Date/Time   First MD Initiated Contact with Patient 01/24/22 1622     (approximate)   History   Fall   HPI  Katie Bell is a 81 y.o. female   Past medical history of cancer, anxiety, depression, GERD, hypertension who presents with a slip and fall down her stairs earlier today with a deformity of the right hip.  No blood thinners.  No loss of consciousness.  No presyncope.  Otherwise has been in her regular state of health.  Baseline ambulatory without assistance.  History was obtained via the patient      Physical Exam   Triage Vital Signs: ED Triage Vitals  Enc Vitals Group     BP 01/24/22 1615 (!) 166/75     Pulse Rate 01/24/22 1621 81     Resp 01/24/22 1621 (!) 22     Temp 01/24/22 1621 98 F (36.7 C)     Temp src --      SpO2 01/24/22 1621 97 %     Weight 01/24/22 1614 110 lb (49.9 kg)     Height 01/24/22 1614 5' (1.524 m)     Head Circumference --      Peak Flow --      Pain Score 01/24/22 1614 10     Pain Loc --      Pain Edu? --      Excl. in [place]? --     Most recent vital signs: Vitals:   01/24/22 1621 01/24/22 2155  BP:  (!) 158/72  Pulse:  84  Resp:  12  Temp: 98 F (36.7 C) 98.2 F (36.8 C)  SpO2:  97%    General: Awake, peers to be in acute distress, pain.  Formally of the right hip.  Neurovascular intact. CV:  Good peripheral perfusion.  Pulses intact pedal right side, sensation intact. Resp:  Normal effort.  Other:  No distention.  Nontender abdomen.  Nontender chest wall.  No signs of head trauma.  Moving upper extremities with full active range of motion, no bony tenderness, she has a small skin tear to the right elbow.   ED Results / Procedures / Treatments   Labs (all labs ordered are listed, but only abnormal results are displayed) Labs Reviewed  CBC WITH DIFFERENTIAL/PLATELET - Abnormal; Notable for the following components:      Result Value   WBC 11.8 (*)     Neutro Abs 10.0 (*)    Abs Immature Granulocytes 0.08 (*)    All other components within normal limits  COMPREHENSIVE METABOLIC PANEL - Abnormal; Notable for the following components:   Potassium 3.2 (*)    CO2 20 (*)    Glucose, Bld 184 (*)    All other components within normal limits     I reviewed labs and they are notable for WBC 11.8  EKG  ED ECG REPORT I, Lucillie Garfinkel, the attending physician, personally viewed and interpreted this ECG.   Date: 01/24/2022  EKG Time: 1619  Rate: 76  Rhythm: normal sinus rhythm  Axis: normal  Intervals: tremors obscure baseline, no stemi. Poor r wave progression    RADIOLOGY I independently reviewed and interpreted hip XR and see intertrochanteric fracture   PROCEDURES:  Critical Care performed: No  Procedures   MEDICATIONS ORDERED IN ED: Medications  HYDROmorphone (DILAUDID) injection 1 mg (1 mg Intravenous Given 01/24/22 1943)  ceFAZolin (ANCEF) IVPB 1 g/50 mL premix (has no  administration in time range)  fentaNYL (SUBLIMAZE) injection 50 mcg (50 mcg Intravenous Given 01/24/22 1622)  fentaNYL (SUBLIMAZE) injection 50 mcg (50 mcg Intravenous Given 01/24/22 1729)  HYDROmorphone (DILAUDID) injection 0.5 mg (0.5 mg Intravenous Given 01/24/22 1734)    Consultants:  I spoke with orthopedist Dr. Sharlet Salina regarding care plan for this patient.   IMPRESSION / MDM / ASSESSMENT AND PLAN / ED COURSE  I reviewed the triage vital signs and the nursing notes.                              Differential diagnosis includes, but is not limited to, fracture or dislocation, CT of the head and neck to rule out intracranial bleed or cervical fracture.  Blunt trauma  MDM: X-ray of the hip with intertrochanteric hip fracture right-sided, Dr. Sharlet Salina notified and will take to the OR tomorrow.  CT head neck pending. Pain control to faciliate scans. NV intact  [name] presentation is most consistent with acute presentation with potential threat to  life or bodily function.       FINAL CLINICAL IMPRESSION(S) / ED DIAGNOSES   Final diagnoses:  Closed fracture of right hip, initial encounter [facility])     Rx / DC Orders   ED Discharge Orders     None        Note:  This document was prepared using Dragon voice recognition software and may include unintentional dictation errors.    Lucillie Garfinkel, MD 01/24/22 2231

## 2022-01-25 ENCOUNTER — Encounter
Admission: EM | Disposition: A | Discharge status: Skilled Nursing Facility | Payer: Self-pay | Source: Home / Self Care | Attending: Internal Medicine

## 2022-01-25 ENCOUNTER — Other Ambulatory Visit: Payer: Self-pay

## 2022-01-25 ENCOUNTER — Inpatient Hospital Stay: Payer: Medicare HMO | Admitting: Anesthesiology

## 2022-01-25 ENCOUNTER — Inpatient Hospital Stay: Payer: Medicare HMO

## 2022-01-25 DIAGNOSIS — S72141A Displaced intertrochanteric fracture of right femur, initial encounter for closed fracture: Secondary | ICD-10-CM | POA: Diagnosis not present

## 2022-01-25 DIAGNOSIS — W19XXXA Unspecified fall, initial encounter: Secondary | ICD-10-CM | POA: Diagnosis not present

## 2022-01-25 HISTORY — PX: INTRAMEDULLARY (IM) NAIL INTERTROCHANTERIC: SHX5875

## 2022-01-25 LAB — TYPE AND SCREEN
ABO/RH(D): O POS
Antibody Screen: NEGATIVE

## 2022-01-25 LAB — COMPREHENSIVE METABOLIC PANEL
ALT: 23 U/L (ref 0–44)
AST: 31 U/L (ref 15–41)
Albumin: 4.2 g/dL (ref 3.5–5.0)
Alkaline Phosphatase: 51 U/L (ref 38–126)
Anion gap: 11 (ref 5–15)
BUN: 17 mg/dL (ref 8–23)
CO2: 23 mmol/L (ref 22–32)
Calcium: 9.5 mg/dL (ref 8.9–10.3)
Chloride: 104 mmol/L (ref 98–111)
Creatinine, Ser: 0.93 mg/dL (ref 0.44–1.00)
GFR, Estimated: >60 mL/min (ref >60)
Glucose, Bld: 200 mg/dL — ABNORMAL HIGH (ref 70–99)
Potassium: 3.8 mmol/L (ref 3.5–5.1)
Sodium: 138 mmol/L (ref 135–145)
Total Bilirubin: 1.1 mg/dL (ref 0.3–1.2)
Total Protein: 7.6 g/dL (ref 6.5–8.1)

## 2022-01-25 LAB — PROTIME-INR
INR: 1 (ref 0.8–1.2)
Prothrombin Time: 13.3 seconds (ref 11.4–15.2)

## 2022-01-25 LAB — CBC
HCT: 42.3 % (ref 36.0–46.0)
Hemoglobin: 14.4 g/dL (ref 12.0–15.0)
MCH: 32 pg (ref 26.0–34.0)
MCHC: 34 g/dL (ref 30.0–36.0)
MCV: 94 fL (ref 80.0–100.0)
Platelets: 193 10*3/uL (ref 150–400)
RBC: 4.5 MIL/uL (ref 3.87–5.11)
RDW: 11.9 % (ref 11.5–15.5)
WBC: 15.2 10*3/uL — ABNORMAL HIGH (ref 4.0–10.5)
nRBC: 0 % (ref 0.0–0.2)

## 2022-01-25 LAB — HEMOGLOBIN AND HEMATOCRIT, BLOOD
HCT: 40.4 % (ref 36.0–46.0)
Hemoglobin: 13.6 g/dL (ref 12.0–15.0)

## 2022-01-25 SURGERY — FIXATION, FRACTURE, INTERTROCHANTERIC, WITH INTRAMEDULLARY ROD
Anesthesia: Spinal | Site: Hip | Laterality: Right

## 2022-01-25 MED ORDER — ONDANSETRON HCL 4 MG PO TABS: 4.0000 mg | ORAL_TABLET | Freq: Four times a day (QID) | ORAL | Status: DC | PRN

## 2022-01-25 MED ORDER — FENTANYL CITRATE (PF) 100 MCG/2ML IJ SOLN: 25.0000 ug | INTRAMUSCULAR | Status: DC | PRN

## 2022-01-25 MED ORDER — FENTANYL CITRATE PF 50 MCG/ML IJ SOSY: 12.5000 ug | PREFILLED_SYRINGE | INTRAMUSCULAR | Status: DC | PRN

## 2022-01-25 MED ORDER — MIDAZOLAM HCL 2 MG/2ML IJ SOLN
INTRAMUSCULAR | Status: AC
  Filled 2022-01-25: qty 2

## 2022-01-25 MED ORDER — MIDAZOLAM HCL 5 MG/5ML IJ SOLN
INTRAMUSCULAR | Status: DC | PRN
  Administered 2022-01-25: 1 mg via INTRAVENOUS

## 2022-01-25 MED ORDER — SODIUM CHLORIDE 0.9 % IR SOLN
Status: DC | PRN
  Administered 2022-01-25: 250 mL

## 2022-01-25 MED ORDER — ASPIRIN 81 MG PO TBEC
81.0000 mg | DELAYED_RELEASE_TABLET | Freq: Two times a day (BID) | ORAL | Status: DC
  Administered 2022-01-26 – 2022-01-29 (×7): 81 mg via ORAL
  Filled 2022-01-25 (×8): qty 1

## 2022-01-25 MED ORDER — SODIUM CHLORIDE 0.9 % IV SOLN: INTRAVENOUS | Status: DC | PRN

## 2022-01-25 MED ORDER — EPHEDRINE 5 MG/ML INJ
INTRAVENOUS | Status: AC
  Filled 2022-01-25: qty 5

## 2022-01-25 MED ORDER — PANTOPRAZOLE SODIUM 40 MG IV SOLR
40.0000 mg | Freq: Two times a day (BID) | INTRAVENOUS | Status: DC
  Administered 2022-01-25 (×2): 40 mg via INTRAVENOUS
  Filled 2022-01-25 (×3): qty 10

## 2022-01-25 MED ORDER — METOCLOPRAMIDE HCL 5 MG/ML IJ SOLN: 5.0000 mg | Freq: Three times a day (TID) | INTRAMUSCULAR | Status: DC | PRN

## 2022-01-25 MED ORDER — KETAMINE HCL 50 MG/ML IJ SOLN
INTRAMUSCULAR | Status: DC | PRN
  Administered 2022-01-25: 25 mg via INTRAVENOUS

## 2022-01-25 MED ORDER — BUPIVACAINE-EPINEPHRINE (PF) 0.5% -1:200000 IJ SOLN
INTRAMUSCULAR | Status: AC
  Filled 2022-01-25: qty 30

## 2022-01-25 MED ORDER — TRANEXAMIC ACID-NACL 1000-0.7 MG/100ML-% IV SOLN
INTRAVENOUS | Status: AC
  Filled 2022-01-25: qty 100

## 2022-01-25 MED ORDER — HYDROMORPHONE HCL 1 MG/ML IJ SOLN
INTRAMUSCULAR | Status: AC
  Administered 2022-01-25: 0.5 mg via INTRAVENOUS
  Filled 2022-01-25: qty 1

## 2022-01-25 MED ORDER — CEFAZOLIN SODIUM-DEXTROSE 1-4 GM/50ML-% IV SOLN
INTRAVENOUS | Status: AC
  Filled 2022-01-25: qty 50

## 2022-01-25 MED ORDER — METOCLOPRAMIDE HCL 5 MG PO TABS: 5.0000 mg | ORAL_TABLET | Freq: Three times a day (TID) | ORAL | Status: DC | PRN

## 2022-01-25 MED ORDER — PROPOFOL 500 MG/50ML IV EMUL
INTRAVENOUS | Status: DC | PRN
  Administered 2022-01-25: 25 ug/kg/min via INTRAVENOUS

## 2022-01-25 MED ORDER — PHENOL 1.4 % MT LIQD: 1.0000 | OROMUCOSAL | Status: DC | PRN

## 2022-01-25 MED ORDER — HYDROMORPHONE HCL 1 MG/ML IJ SOLN
0.5000 mg | INTRAMUSCULAR | Status: DC | PRN
  Administered 2022-01-25: 0.5 mg via INTRAVENOUS

## 2022-01-25 MED ORDER — BUPIVACAINE HCL (PF) 0.5 % IJ SOLN
INTRAMUSCULAR | Status: AC
  Filled 2022-01-25: qty 10

## 2022-01-25 MED ORDER — HYDROMORPHONE HCL 1 MG/ML IJ SOLN
0.5000 mg | INTRAMUSCULAR | Status: DC | PRN
  Administered 2022-01-25 – 2022-02-05 (×7): 0.5 mg via INTRAVENOUS
  Filled 2022-01-25 (×3): qty 1
  Filled 2022-01-25: qty 0.5
  Filled 2022-01-25 (×3): qty 1

## 2022-01-25 MED ORDER — METHOCARBAMOL 1000 MG/10ML IJ SOLN: 500.0000 mg | Freq: Four times a day (QID) | INTRAVENOUS | Status: DC | PRN

## 2022-01-25 MED ORDER — SODIUM CHLORIDE 0.45 % IV SOLN: INTRAVENOUS | Status: DC

## 2022-01-25 MED ORDER — CEFAZOLIN SODIUM-DEXTROSE 2-4 GM/100ML-% IV SOLN
2.0000 g | Freq: Four times a day (QID) | INTRAVENOUS | Status: AC
  Administered 2022-01-25 – 2022-01-26 (×2): 2 g via INTRAVENOUS
  Filled 2022-01-25 (×2): qty 100

## 2022-01-25 MED ORDER — PHENYLEPHRINE HCL (PRESSORS) 10 MG/ML IV SOLN
INTRAVENOUS | Status: DC | PRN
  Administered 2022-01-25: 160 ug via INTRAVENOUS
  Administered 2022-01-25: 80 ug via INTRAVENOUS
  Administered 2022-01-25 (×3): 160 ug via INTRAVENOUS
  Administered 2022-01-25: 80 ug via INTRAVENOUS

## 2022-01-25 MED ORDER — ONDANSETRON HCL 4 MG/2ML IJ SOLN: 4.0000 mg | Freq: Four times a day (QID) | INTRAMUSCULAR | Status: DC | PRN

## 2022-01-25 MED ORDER — MENTHOL 3 MG MT LOZG: 1.0000 | LOZENGE | OROMUCOSAL | Status: DC | PRN

## 2022-01-25 MED ORDER — GENTAMICIN SULFATE 40 MG/ML IJ SOLN
INTRAMUSCULAR | Status: AC
  Filled 2022-01-25: qty 2

## 2022-01-25 MED ORDER — HYDROCODONE-ACETAMINOPHEN 5-325 MG PO TABS: 1.0000 | ORAL_TABLET | ORAL | 0 refills | Status: DC | PRN

## 2022-01-25 MED ORDER — TRANEXAMIC ACID-NACL 1000-0.7 MG/100ML-% IV SOLN: 1000.0000 mg | Freq: Once | INTRAVENOUS | Status: DC

## 2022-01-25 MED ORDER — AMLODIPINE BESYLATE 5 MG PO TABS
5.0000 mg | ORAL_TABLET | Freq: Every day | ORAL | Status: DC
  Administered 2022-01-26 – 2022-01-29 (×3): 5 mg via ORAL
  Filled 2022-01-25 (×4): qty 1

## 2022-01-25 MED ORDER — ACETAMINOPHEN 325 MG PO TABS
650.0000 mg | ORAL_TABLET | Freq: Four times a day (QID) | ORAL | Status: DC | PRN
  Administered 2022-01-26 – 2022-02-09 (×6): 650 mg via ORAL
  Filled 2022-01-25 (×7): qty 2

## 2022-01-25 MED ORDER — 0.9 % SODIUM CHLORIDE (POUR BTL) OPTIME
TOPICAL | Status: DC | PRN
  Administered 2022-01-25: 1000 mL

## 2022-01-25 MED ORDER — DOCUSATE SODIUM 100 MG PO CAPS
100.0000 mg | ORAL_CAPSULE | Freq: Two times a day (BID) | ORAL | Status: DC
  Administered 2022-01-26 – 2022-02-03 (×9): 100 mg via ORAL
  Filled 2022-01-25 (×14): qty 1

## 2022-01-25 MED ORDER — LIDOCAINE HCL (PF) 2 % IJ SOLN
INTRAMUSCULAR | Status: AC
  Filled 2022-01-25: qty 5

## 2022-01-25 MED ORDER — PROPOFOL 10 MG/ML IV BOLUS
INTRAVENOUS | Status: AC
  Filled 2022-01-25: qty 20

## 2022-01-25 MED ORDER — STERILE WATER FOR IRRIGATION IR SOLN
Status: DC | PRN
  Administered 2022-01-25: 1000 mL

## 2022-01-25 MED ORDER — EPHEDRINE SULFATE (PRESSORS) 50 MG/ML IJ SOLN
INTRAMUSCULAR | Status: DC | PRN
  Administered 2022-01-25: 10 mg via INTRAVENOUS
  Administered 2022-01-25: 5 mg via INTRAVENOUS

## 2022-01-25 MED ORDER — BUPIVACAINE HCL (PF) 0.5 % IJ SOLN
INTRAMUSCULAR | Status: DC | PRN
  Administered 2022-01-25: 2.2 mL via INTRATHECAL

## 2022-01-25 MED ORDER — LIDOCAINE HCL (PF) 1 % IJ SOLN
INTRAMUSCULAR | Status: AC
  Filled 2022-01-25: qty 30

## 2022-01-25 MED ORDER — KETAMINE HCL 50 MG/5ML IJ SOSY
PREFILLED_SYRINGE | INTRAMUSCULAR | Status: AC
  Filled 2022-01-25: qty 5

## 2022-01-25 MED ORDER — PHENYLEPHRINE 80 MCG/ML (10ML) SYRINGE FOR IV PUSH (FOR BLOOD PRESSURE SUPPORT)
PREFILLED_SYRINGE | INTRAVENOUS | Status: AC
  Filled 2022-01-25: qty 10

## 2022-01-25 MED ORDER — OXYCODONE HCL 5 MG PO TABS
5.0000 mg | ORAL_TABLET | ORAL | Status: DC | PRN
  Administered 2022-01-27 – 2022-02-05 (×8): 5 mg via ORAL
  Filled 2022-01-25 (×8): qty 1

## 2022-01-25 MED ORDER — ONDANSETRON HCL 4 MG/2ML IJ SOLN
4.0000 mg | Freq: Four times a day (QID) | INTRAMUSCULAR | Status: DC | PRN
  Administered 2022-01-25: 4 mg via INTRAVENOUS
  Filled 2022-01-25: qty 2

## 2022-01-25 SURGICAL SUPPLY — 50 items
BIT DRILL INTERTAN LAG SCREW (BIT) IMPLANT
BIT DRILL LONG 4.0 (BIT) IMPLANT
BNDG COHESIVE 6X5 TAN ST LF (GAUZE/BANDAGES/DRESSINGS) ×3 IMPLANT
CHLORAPREP W/TINT 26 (MISCELLANEOUS) ×1 IMPLANT
DRAPE 3/4 80X56 (DRAPES) ×2 IMPLANT
DRAPE C-ARM 42X72 X-RAY (DRAPES) ×1 IMPLANT
DRAPE SURG 17X11 SM STRL (DRAPES) ×2 IMPLANT
DRAPE U-SHAPE 47X51 STRL (DRAPES) ×1 IMPLANT
DRILL BIT LONG 4.0 (BIT) ×1
DRSG OPSITE POSTOP 3X4 (GAUZE/BANDAGES/DRESSINGS) ×2 IMPLANT
DRSG OPSITE POSTOP 4X14 (GAUZE/BANDAGES/DRESSINGS) IMPLANT
DRSG OPSITE POSTOP 4X6 (GAUZE/BANDAGES/DRESSINGS) ×1 IMPLANT
ELECT REM PT RETURN 9FT ADLT (ELECTROSURGICAL) ×1
ELECTRODE REM PT RTRN 9FT ADLT (ELECTROSURGICAL) ×1 IMPLANT
GAUZE SPONGE 4X4 12PLY STRL (GAUZE/BANDAGES/DRESSINGS) ×1 IMPLANT
GAUZE XEROFORM 1X8 LF (GAUZE/BANDAGES/DRESSINGS) ×1 IMPLANT
GLOVE BIO SURGEON STRL SZ7.5 (GLOVE) ×1 IMPLANT
GLOVE SURG UNDER POLY LF SZ7.5 (GLOVE) ×1 IMPLANT
GOWN STRL REUS W/ TWL XL LVL3 (GOWN DISPOSABLE) ×1 IMPLANT
GOWN STRL REUS W/TWL XL LVL3 (GOWN DISPOSABLE) ×1
GOWN STRL REUS W/TWL XL LVL4 (GOWN DISPOSABLE) ×1 IMPLANT
GUIDE PIN 3.2X343 (PIN) ×2
GUIDE PIN 3.2X343MM (PIN) ×2
HEMOVAC 400CC 10FR (MISCELLANEOUS) IMPLANT
HOLDER FOLEY CATH W/STRAP (MISCELLANEOUS) ×1 IMPLANT
KIT TURNOVER CYSTO (KITS) ×1 IMPLANT
MANIFOLD NEPTUNE II (INSTRUMENTS) ×1 IMPLANT
MAT ABSORB  FLUID 56X50 GRAY (MISCELLANEOUS) ×1
MAT ABSORB FLUID 56X50 GRAY (MISCELLANEOUS) ×1 IMPLANT
NAIL TRIGEN INTERTAN 10X18CM (Nail) IMPLANT
NS IRRIG 1000ML POUR BTL (IV SOLUTION) ×1 IMPLANT
PACK HIP COMPR (MISCELLANEOUS) ×1 IMPLANT
PAD ABD DERMACEA PRESS 5X9 (GAUZE/BANDAGES/DRESSINGS) ×1 IMPLANT
PAD ARMBOARD 7.5X6 YLW CONV (MISCELLANEOUS) ×1 IMPLANT
PIN GUIDE 3.2X343MM (PIN) IMPLANT
SCREW LAG COMPR KIT 85/80 (Screw) IMPLANT
SCREW TRIGEN LOW PROF 5.0X27.5 (Screw) IMPLANT
STAPLER SKIN PROX 35W (STAPLE) ×1 IMPLANT
SUCTION FRAZIER HANDLE 10FR (MISCELLANEOUS) ×1
SUCTION TUBE FRAZIER 10FR DISP (MISCELLANEOUS) ×1 IMPLANT
SUT VIC AB 0 CT1 36 (SUTURE) ×2 IMPLANT
SUT VIC AB 2-0 CT1 (SUTURE) ×1 IMPLANT
SUT VIC AB 2-0 CT1 27 (SUTURE) ×1
SUT VIC AB 2-0 CT1 TAPERPNT 27 (SUTURE) ×1 IMPLANT
SUT VICRYL 0 AB UR-6 (SUTURE) ×1 IMPLANT
SYR 30ML LL (SYRINGE) ×1 IMPLANT
TAPE PAPER 1/2X10 TAN MEDIPORE (MISCELLANEOUS) ×1 IMPLANT
TRAP FLUID SMOKE EVACUATOR (MISCELLANEOUS) ×1 IMPLANT
TRAY FOLEY SLVR 16FR LF STAT (SET/KITS/TRAYS/PACK) ×1 IMPLANT
WATER STERILE IRR 500ML POUR (IV SOLUTION) ×1 IMPLANT

## 2022-01-25 NOTE — Anesthesia Preprocedure Evaluation (Signed)
Anesthesia Evaluation    Airway Mallampati: III  TM Distance: <3 FB Neck ROM: limited    Dental  (+) Chipped, Missing   Pulmonary former smoker,    Pulmonary exam normal        Cardiovascular hypertension, Normal cardiovascular exam     Neuro/Psych PSYCHIATRIC DISORDERS Anxiety Depression Oriented to person. Able to answer questions, but not date/place. Consent via phone w/ son   GI/Hepatic hiatal hernia, PUD, GERD  ,Pt denies GERD at present   Endo/Other    Renal/GU      Musculoskeletal  (+) Arthritis ,   Abdominal   Peds  Hematology   Anesthesia Other Findings   Reproductive/Obstetrics                             Anesthesia Physical Anesthesia Plan  ASA: 3  Anesthesia Plan: Spinal   Post-op Pain Management:    Induction:   PONV Risk Score and Plan: 2 and Ondansetron  Airway Management Planned: Natural Airway and Nasal Cannula  Additional Equipment:   Intra-op Plan:   Post-operative Plan:   Informed Consent: I have reviewed the patients History and Physical, chart, labs and discussed the procedure including the risks, benefits and alternatives for the proposed anesthesia with the patient or authorized representative who has indicated his/her understanding and acceptance.     Dental Advisory Given  Plan Discussed with: Anesthesiologist, CRNA and Surgeon  Anesthesia Plan Comments: (Patient reports no bleeding problems and no anticoagulant use.  Plan for spinal with backup GA  Patient consented for risks of anesthesia including but not limited to:  - adverse reactions to medications - damage to eyes, teeth, lips or other oral mucosa - nerve damage due to positioning  - risk of bleeding, infection and or nerve damage from spinal that could lead to paralysis - risk of headache or failed spinal - damage to teeth, lips or other oral mucosa - sore throat or hoarseness -  damage to heart, brain, nerves, lungs, other parts of body or loss of life  Patient voiced understanding.)        Anesthesia Quick Evaluation

## 2022-01-25 NOTE — Anesthesia Procedure Notes (Signed)
Spinal  Patient location during procedure: OR Reason for block: surgical anesthesia Staffing Performed: resident/CRNA  Anesthesiologist: Martha Clan, MD Resident/CRNA: Rolla Plate, CRNA Performed by: Rolla Plate, CRNA Authorized by: Martha Clan, MD   Preanesthetic Checklist Completed: patient identified, IV checked, site marked, risks and benefits discussed, surgical consent, monitors and equipment checked, pre-op evaluation and timeout performed Spinal Block Patient position: left lateral decubitus Prep: ChloraPrep and site prepped and draped Patient monitoring: heart rate, continuous pulse ox, blood pressure and cardiac monitor Approach: midline Location: L4-5 Injection technique: single-shot Needle Needle type: Whitacre and Introducer  Needle gauge: 24 G Needle length: 9 cm Assessment Events: CSF return Additional Notes Negative paresthesia. Negative blood return. Positive free-flowing CSF. Expiration date of kit checked and confirmed. Patient tolerated procedure well, without complications.

## 2022-01-25 NOTE — Progress Notes (Signed)
PROGRESS NOTE    Katie Bell  EXN:[id number] DOB: 03/30/1941 DOA: 01/24/2022 PCP: Venia Carbon, MD    Brief Narrative:  81 y.o. female with medical history significant of essential hypertension, anxiety disorder, previous history of basal cell carcinoma, GERD and kidney stones who presents to the ER after sustaining a mechanical fall at home.  Patient apparently slipped and fell down her stairs.  Sustained pain on the right hip with noticeable deformity.  Did not hit her head.  No evidence of other bodily injuries.  Patient brought to the ER where she was noted to have right intertrochanteric fracture.  Orthopedics consulted and plan to do operative repair 10/5.  Patient being admitted to the medical service.  She has no new complaints.  Pain is well controlled for the most part.   Assessment & Plan:   Principal Problem:   Closed right femoral fracture [facility]) Active Problems:   Hypertension   Leucocytosis   Fall at home, initial encounter  Right intertrochanteric hip fracture Secondary to mechanical fall.  No evidence of syncopal event.  Medically stable at this time. Plan: N.p.o. Pain control OR today Hold any thinners for DVT prophylaxis PT and OT start postoperative day #1  Essential hypertension Resume home amlodipine  Hypokalemia Monitor and replace as necessary  Leukocytosis Suspect secondary to stress reaction.  No obvious source of infection.  Will monitor white count  GERD Twice daily PPI for now   DVT prophylaxis: On hold Code Status: Full Family Communication: Lauro Regulus [phone] on 10/5 Disposition Plan: Status is: Inpatient Remains inpatient appropriate because: Hip fracture.  Operative repair planned 10/5.  Will likely need skilled nursing facility.   Level of care: Med-Surg  Consultants:  Orthopedic surgery  Procedures:  Hip fracture repair planned 10/5  Antimicrobials: None   Subjective: Patient seen and examined.  Somewhat  flattened affect but medically stable.  Endorses pain in right lower extremity.  Objective: Vitals:   01/25/22 0818 01/25/22 0900 01/25/22 1101 01/25/22 1200  BP: 136/67 136/67 (!) 174/78 (!) 151/64  Pulse: 85 87 (!) 110 (!) 107  Resp: 10 (!) '9 20 19  '$ Temp:  98.1 F (36.7 C)    SpO2: 96%  97% 96%  Weight:      Height:       No intake or output data in the 24 hours ending 01/25/22 1331 Filed Weights   01/24/22 1614  Weight: 49.9 kg    Examination:  General exam: NAD.  Weak voice Respiratory system: Clear to auscultation. Respiratory effort normal. Cardiovascular system: S1 and S2, RRR, no murmurs, no pedal edema Gastrointestinal system: Soft, NT/ND, normal bowel sounds Central nervous system: Alert and oriented. No focal neurological deficits. Extremities: Right lower extremity shortened and externally rotated Skin: No rashes, lesions or ulcers Psychiatry: Judgement and insight appear normal. Mood & affect flattened.     Data Reviewed: I have personally reviewed following labs and imaging studies  CBC: Recent Labs  Lab 01/24/22 1623 01/25/22 0527  WBC 11.8* 15.2*  NEUTROABS 10.0*  --   HGB 13.7 14.4  HCT 39.9 42.3  MCV 93.0 94.0  PLT 192 466   Basic Metabolic Panel: Recent Labs  Lab 01/24/22 1942 01/25/22 0527  NA 138 138  K 3.2* 3.8  CL 106 104  CO2 20* 23  GLUCOSE 184* 200*  BUN 16 17  CREATININE 0.88 0.93  CALCIUM 9.3 9.5   GFR: Estimated Creatinine Clearance: 33.5 mL/min (by C-G formula based on SCr of  0.93 mg/dL). Liver Function Tests: Recent Labs  Lab 01/24/22 1942 01/25/22 0527  AST 29 31  ALT 16 23  ALKPHOS 51 51  BILITOT 1.0 1.1  PROT 7.4 7.6  ALBUMIN 4.2 4.2   No results for input(s): "LIPASE", "AMYLASE" in the last 168 hours. No results for input(s): "AMMONIA" in the last 168 hours. Coagulation Profile: Recent Labs  Lab 01/25/22 0527  INR 1.0   Cardiac Enzymes: No results for input(s): "CKTOTAL", "CKMB", "CKMBINDEX",  "TROPONINI" in the last 168 hours. BNP (last 3 results) No results for input(s): "PROBNP" in the last 8760 hours. HbA1C: No results for input(s): "HGBA1C" in the last 72 hours. CBG: No results for input(s): "GLUCAP" in the last 168 hours. Lipid Profile: No results for input(s): "CHOL", "HDL", "LDLCALC", "TRIG", "CHOLHDL", "LDLDIRECT" in the last 72 hours. Thyroid Function Tests: No results for input(s): "TSH", "T4TOTAL", "FREET4", "T3FREE", "THYROIDAB" in the last 72 hours. Anemia Panel: No results for input(s): "VITAMINB12", "FOLATE", "FERRITIN", "TIBC", "IRON", "RETICCTPCT" in the last 72 hours. Sepsis Labs: No results for input(s): "PROCALCITON", "LATICACIDVEN" in the last 168 hours.  No results found for this or any previous visit (from the past 240 hour(s)).       Radiology Studies: CT Head Wo Contrast  Result Date: 01/24/2022 CLINICAL DATA:  Fall EXAM: CT HEAD WITHOUT CONTRAST CT CERVICAL SPINE WITHOUT CONTRAST TECHNIQUE: Multidetector CT imaging of the head and cervical spine was performed following the standard protocol without intravenous contrast. Multiplanar CT image reconstructions of the cervical spine were also generated. RADIATION DOSE REDUCTION: This exam was performed according to the departmental dose-optimization program which includes automated exposure control, adjustment of the mA and/or kV according to patient size and/or use of iterative reconstruction technique. COMPARISON:  None Available. FINDINGS: CT HEAD FINDINGS Brain: No evidence of acute infarction, hemorrhage, hydrocephalus, extra-axial collection or mass lesion/mass effect. Subcortical white matter and periventricular small vessel ischemic changes. Vascular: Intracranial atherosclerosis. Skull: Normal. Negative for fracture or focal lesion. Sinuses/Orbits: The visualized paranasal sinuses are essentially clear. The mastoid air cells are unopacified. Other: None. CT CERVICAL SPINE FINDINGS Alignment: Normal  cervical lordosis. Skull base and vertebrae: No acute fracture. No primary bone lesion or focal pathologic process. Soft tissues and spinal canal: No prevertebral fluid or swelling. No visible canal hematoma. Disc levels: Very mild degenerative changes of the mid/lower cervical spine. Spinal canal is patent. Upper chest: Visualized lung apices are clear. Other: Visualized thyroid is unremarkable. IMPRESSION: No evidence of acute intracranial abnormality. Small vessel ischemic changes. No traumatic injury to the cervical spine. Electronically Signed   By: Julian Hy M.D.   On: 01/24/2022 20:30   CT Cervical Spine Wo Contrast  Result Date: 01/24/2022 CLINICAL DATA:  Fall EXAM: CT HEAD WITHOUT CONTRAST CT CERVICAL SPINE WITHOUT CONTRAST TECHNIQUE: Multidetector CT imaging of the head and cervical spine was performed following the standard protocol without intravenous contrast. Multiplanar CT image reconstructions of the cervical spine were also generated. RADIATION DOSE REDUCTION: This exam was performed according to the departmental dose-optimization program which includes automated exposure control, adjustment of the mA and/or kV according to patient size and/or use of iterative reconstruction technique. COMPARISON:  None Available. FINDINGS: CT HEAD FINDINGS Brain: No evidence of acute infarction, hemorrhage, hydrocephalus, extra-axial collection or mass lesion/mass effect. Subcortical white matter and periventricular small vessel ischemic changes. Vascular: Intracranial atherosclerosis. Skull: Normal. Negative for fracture or focal lesion. Sinuses/Orbits: The visualized paranasal sinuses are essentially clear. The mastoid air cells are unopacified. Other: None.  CT CERVICAL SPINE FINDINGS Alignment: Normal cervical lordosis. Skull base and vertebrae: No acute fracture. No primary bone lesion or focal pathologic process. Soft tissues and spinal canal: No prevertebral fluid or swelling. No visible canal  hematoma. Disc levels: Very mild degenerative changes of the mid/lower cervical spine. Spinal canal is patent. Upper chest: Visualized lung apices are clear. Other: Visualized thyroid is unremarkable. IMPRESSION: No evidence of acute intracranial abnormality. Small vessel ischemic changes. No traumatic injury to the cervical spine. Electronically Signed   By: Julian Hy M.D.   On: 01/24/2022 20:30   DG Chest 1 View  Result Date: 01/24/2022 CLINICAL DATA:  Pt fell backwards off of first stair.Pt complaining of right hip pain and refuses any attempt to adjust for positioning. Best images obtainable EXAM: CHEST  1 VIEW COMPARISON:  12/18/2019 FINDINGS: Relatively low lung volumes. No confluent airspace infiltrate or overt edema. Heart size and mediastinal contours are within normal limits. Aortic Atherosclerosis (ICD10-170.0). No effusion. Visualized bones unremarkable. IMPRESSION: No acute cardiopulmonary disease. Electronically Signed   By: Lucrezia Europe M.D.   On: 01/24/2022 17:01   DG Hip Unilat  With Pelvis 2-3 Views Right  Result Date: 01/24/2022 CLINICAL DATA:  Pain post fall EXAM: DG HIP (WITH OR WITHOUT PELVIS) 2-3V RIGHT COMPARISON:  CT 07/12/2020 FINDINGS: Mildly comminuted intertrochanteric fracture of the proximal right femur with varus deformity and foreshortening. No dislocation. Bony pelvis intact. Surgical clips in the left pelvis. Mild spondylitic changes in the visualized lower lumbar spine. IMPRESSION: Mildly comminuted intertrochanteric right femur fracture. Electronically Signed   By: Lucrezia Europe M.D.   On: 01/24/2022 16:50        Scheduled Meds:  amLODipine  5 mg Oral Daily   pantoprazole (PROTONIX) IV  40 mg Intravenous Q12H   Continuous Infusions:  sodium chloride 100 mL/hr at 01/25/22 0535    ceFAZolin (ANCEF) IV       LOS: 1 day     Sidney Ace, MD [facility]   If 7PM-7AM, please contact night-coverage  01/25/2022, 1:31 PM

## 2022-01-25 NOTE — Op Note (Signed)
DATE OF SURGERY:  01/25/2022  TIME: 5:15 PM  PATIENT NAME:  Katie Bell  AGE: 81 y.o.  PRE-OPERATIVE DIAGNOSIS:  Right Hip Fracture  POST-OPERATIVE DIAGNOSIS:  SAME  PROCEDURE:  Right INTRAMEDULLARY (IM) NAIL INTERTROCHANTERIC  SURGEON:  Renee Harder  EBL:  672 cc  COMPLICATIONS:  none  OPERATIVE IMPLANTS: [employer] Intertan femoral nail 180 mm x 10 mm  PREOPERATIVE INDICATIONS:  [name] is a 81 y.o. year old who fell and suffered an intertrochanteric hip fracture. She was brought into the ER and then admitted and optimized and then elected for surgical intervention.    The risks benefits and alternatives were discussed with the patient including but not limited to the risks of nonoperative treatment, versus surgical intervention including infection, bleeding, nerve injury, malunion, nonunion, hardware prominence, hardware failure, need for hardware removal, blood clots, cardiopulmonary complications, morbidity, mortality, among others, and they were willing to proceed.    OPERATIVE PROCEDURE:  The patient was brought to the operating room and placed in the supine position.  Spinal anesthesia was administered, with a foley. She was placed on the fracture table.  Closed reduction was performed under C-arm guidance. The length of the femur was also measured using fluoroscopy. Time out was then performed after sterile prep and drape. She received preoperative antibiotics.  Incision was made proximal to the greater trochanter. A guidewire was placed in the appropriate position. Confirmation was made on AP and lateral views. The above-named nail was opened. I opened the proximal femur with a reamer. I then placed the nail by hand easily down. I did not need to ream the femur.  Once the nail was completely seated, I placed a guidepin into the femoral head into the center center position through a second incision.  I measured the length, and then reamed the lateral  cortex and up into the head. I then placed the two interlocking lag screws. Slight compression was applied. Anatomic fixation achieved. Bone quality was poor.  I then secured the proximal interlocking screws.  I then removed the instruments, and took final C-arm pictures AP and lateral the entire length of the leg. Anatomic reconstruction was achieved, and the wounds were irrigated copiously and closed with Vicryl  followed by staples and dry sterile dressing. Sponge and needle count were correct.   The patient was awakened and returned to PACU in stable and satisfactory condition. There no complications and the patient tolerated the procedure well.   POSTOPERATIVE PLAN: She will be weightbearing as tolerated.   Ok to start DVT ppx POD#1- aspirin '81mg'$  BID Dressing change by nursing staff as needed to keep dressing clean and dry Outpatient f/u in clinic in 2 weeks for staple removal and xrays  Renee Harder

## 2022-01-25 NOTE — ED Notes (Signed)
Pt noted to have an episode of possible coffee ground emesis. MD Sudheer notified. Stat H&H sent, IV zofran and protonix given.

## 2022-01-25 NOTE — Transfer of Care (Signed)
Immediate Anesthesia Transfer of Care Note  Patient: Katie Bell  Procedure(s) Performed: INTRAMEDULLARY (IM) NAIL INTERTROCHANTERIC (Right: Hip)  Patient Location: PACU  Anesthesia Type:Spinal  Level of Consciousness: awake  Airway & Oxygen Therapy: Patient Spontanous Breathing and Patient connected to face mask oxygen  Post-op Assessment: Report given to RN and Post -op Vital signs reviewed and stable  Post vital signs: Reviewed  Last Vitals:  Vitals Value Taken Time  BP    Temp    Pulse    Resp    SpO2      Last Pain:         Complications: No notable events documented.

## 2022-01-25 NOTE — Progress Notes (Signed)
Per anesthesia Dr. Rosey Bath:  ketamine given in OR, r/t slow wake up response in pacu. More awake and alert now, taking po ice chips without event.  Indwelling foley in place, remains slightly difficult to assess spinal, will continue to monitor closely.

## 2022-01-25 NOTE — Discharge Instructions (Signed)
Orthopedic discharge instructions: Patient has had right hip intramedullary nail Patient should remain full weightbearing on the operative extremity until follow-up Dressing changes as needed DVT prophylaxis includes '81mg'$  aspirin twice per day Follow up with Dr. Minda Meo PA, Murilo Zanette PA-C at [employer], [place] office in 10-14 days for wound check, staple removal and x-ray.

## 2022-01-25 NOTE — Consult Note (Signed)
ORTHOPAEDIC CONSULTATION  REQUESTING PHYSICIAN: Sidney Ace, MD  Chief Complaint: Right hip pain  HPI: Katie Bell is a 81 y.o. female who complains of right hip pain after mechanical fall. The pain is sharp in character. The pain is worse with movement and better with rest. Denies any numbness, tingling or constitutional symptoms.  History is obtained from chart and from her son.  Patient lives with her son.  She does not use any assistive ambulatory device.  Medical history notable for essential hypertension, anxiety disorder, previous history of basal cell carcinoma, GERD and kidney stones   Past Medical History:  Diagnosis Date   Anxiety    Cancer ([place]) 2015   basal cell removed from leg   Depression    Episodic mood disorder ([place])    mostly anxiety--depression in the past   Gastric ulcer    History   GERD (gastroesophageal reflux disease)    History of blood transfusion    At [facility] appro [facility] yrs ago   History of hiatal hernia    History of kidney stones 1960   Hypertension    Malignant melanoma ([place]) 07/14/2014   Left proximal post lat thigh. Breslow's 0.64m, Anatomic level II. Excised 08/03/2014   Osteoarthritis, hand    hand   Postmenopausal disorder    SVD (spontaneous vaginal delivery)    x 3   Past Surgical History:  Procedure Laterality Date   CATARACT EXTRACTION W/PHACO Right 02/12/2019   Procedure: CATARACT EXTRACTION PHACO AND INTRAOCULAR LENS PLACEMENT ([place];  Surgeon: HMarchia Meiers MD;  Location: [facility];  Service: Ophthalmology;  Laterality: Right;  [place]:34.2 CDE 17.97 Fluid Pack lot # [id number]   CATARACT EXTRACTION W/PHACO Left 03/26/2019   Procedure: CATARACT EXTRACTION PHACO AND INTRAOCULAR LENS PLACEMENT ([place] LEFT VISION BLUE;  Surgeon: HMarchia Meiers MD;  Location: [facility];  Service: Ophthalmology;  Laterality: Left;  [place]01:16.2 CDE 11.70 FLUID PACK LOT # [id number]   CHOLECYSTECTOMY  2008   COLONOSCOPY  04/2011    CYSTOCELE REPAIR N/A 12/31/2013   Procedure: ANTERIOR REPAIR (CYSTOCELE);  Surgeon: TDonnamae Jude MD;  [facility];  Service: Gynecology;  Laterality: N/A;   CYSTOSCOPY W/ URETERAL STENT PLACEMENT Left 01/16/2016   Procedure: CYSTOSCOPY WITH STENT REPLACEMENT;  Surgeon: AHollice Espy MD;  Location: [facility];  Service: Urology;  Laterality: Left;   CYSTOSCOPY WITH STENT PLACEMENT Left 12/23/2015   Procedure: CYSTOSCOPY WITH STENT PLACEMENT;  Surgeon: BArdis Hughs MD;  Location: [facility];  Service: Urology;  Laterality: Left;   DIAGNOSTIC LAPAROSCOPY     EYE SURGERY     kidney stone removed     MELANOMA EXCISION  2015   left posterior thigh   TONSILLECTOMY  1944   TUBAL LIGATION     UPPER GI ENDOSCOPY     URETEROSCOPY WITH HOLMIUM LASER LITHOTRIPSY Left 01/16/2016   Procedure: URETEROSCOPY WITH HOLMIUM LASER LITHOTRIPSY;  Surgeon: AHollice Espy MD;  Location: [facility];  Service: Urology;  Laterality: Left;   WISDOM TOOTH EXTRACTION     Social History   Socioeconomic History   Marital status: Divorced    Spouse name: Not on file   Number of children: 3   Years of education: Not on file   Highest education level: Not on file  Occupational History   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 Then son [name]   Would accept resuscitation attempts but no prolonged ventilation   No tube feeds if cognitively unaware      Social Determinants of Health   Financial Resource Strain: Not on file  Food Insecurity: Not on file   Transportation Needs: Not on file  Physical Activity: Not on file  Stress: Not on file  Social Connections: Not on file   Family History  Problem Relation Age of Onset   Alzheimer's disease Mother    Alcohol abuse Father    COPD Brother    Heart disease Neg Hx    Cancer Neg Hx    Allergies  Allergen Reactions   Sulfamethoxazole-Trimethoprim Swelling    Angioedema and pruritis per 01/01/2017 documentation in care everywhere   Estradiol Other (See Comments)    Vaginal irritation Other reaction(s): Other (See Comments) Vaginal irritation   Morphine     agitation   Sertraline Hcl Other (See Comments)    Migraine headaches, severe   Morphine And Related Anxiety   Prior to Admission medications   Medication Sig Start Date End Date Taking? Authorizing Provider  amLODipine (NORVASC) 5 MG tablet TAKE 1 TABLET BY MOUTH ONCE DAILY 12/06/21  Yes Venia Carbon, MD  Cholecalciferol (VITAMIN D) 400 UNITS capsule Take 800 Units by mouth daily.    Yes [provider]  potassium chloride SA (KLOR-CON M) 20 MEQ tablet TAKE 1 TABLET BY MOUTH ONCE DAILY 12/06/21  Yes Venia Carbon, MD   CT Head Wo Contrast  Result Date: 01/24/2022 CLINICAL DATA:  Fall EXAM: CT HEAD WITHOUT CONTRAST CT CERVICAL SPINE WITHOUT CONTRAST TECHNIQUE: Multidetector CT imaging of the head and cervical spine was performed following the standard protocol without intravenous contrast. Multiplanar CT image reconstructions of the cervical spine were also generated. RADIATION DOSE REDUCTION: This exam was performed according to the departmental dose-optimization program which includes automated exposure control, adjustment of the mA and/or kV according to patient size and/or use of iterative reconstruction technique. COMPARISON:  None Available. FINDINGS: CT HEAD FINDINGS Brain: No evidence of acute infarction, hemorrhage, hydrocephalus, extra-axial collection or mass lesion/mass effect. Subcortical white matter and  periventricular small vessel ischemic changes. Vascular: Intracranial atherosclerosis. Skull: Normal. Negative for fracture or focal lesion. Sinuses/Orbits: The visualized paranasal sinuses are essentially clear. The mastoid air cells are unopacified. Other: None. CT CERVICAL SPINE FINDINGS Alignment: Normal cervical lordosis. Skull base and vertebrae: No acute fracture. No primary bone lesion or focal pathologic process. Soft tissues and spinal canal: No prevertebral fluid or swelling. No visible canal hematoma. Disc levels: Very mild degenerative changes of the mid/lower cervical spine. Spinal canal is patent. Upper chest: Visualized lung apices are clear. Other: Visualized thyroid is unremarkable. IMPRESSION: No evidence of acute intracranial abnormality. Small vessel ischemic changes. No traumatic injury to the cervical spine. Electronically Signed   By: Julian Hy M.D.   On: 01/24/2022 20:30   CT Cervical Spine Wo Contrast  Result Date: 01/24/2022 CLINICAL DATA:  Fall EXAM: CT HEAD WITHOUT CONTRAST CT CERVICAL SPINE WITHOUT CONTRAST TECHNIQUE: Multidetector CT imaging of  the head and cervical spine was performed following the standard protocol without intravenous contrast. Multiplanar CT image reconstructions of the cervical spine were also generated. RADIATION DOSE REDUCTION: This exam was performed according to the departmental dose-optimization program which includes automated exposure control, adjustment of the mA and/or kV according to patient size and/or use of iterative reconstruction technique. COMPARISON:  None Available. FINDINGS: CT HEAD FINDINGS Brain: No evidence of acute infarction, hemorrhage, hydrocephalus, extra-axial collection or mass lesion/mass effect. Subcortical white matter and periventricular small vessel ischemic changes. Vascular: Intracranial atherosclerosis. Skull: Normal. Negative for fracture or focal lesion. Sinuses/Orbits: The visualized paranasal sinuses are  essentially clear. The mastoid air cells are unopacified. Other: None. CT CERVICAL SPINE FINDINGS Alignment: Normal cervical lordosis. Skull base and vertebrae: No acute fracture. No primary bone lesion or focal pathologic process. Soft tissues and spinal canal: No prevertebral fluid or swelling. No visible canal hematoma. Disc levels: Very mild degenerative changes of the mid/lower cervical spine. Spinal canal is patent. Upper chest: Visualized lung apices are clear. Other: Visualized thyroid is unremarkable. IMPRESSION: No evidence of acute intracranial abnormality. Small vessel ischemic changes. No traumatic injury to the cervical spine. Electronically Signed   By: Julian Hy M.D.   On: 01/24/2022 20:30   DG Chest 1 View  Result Date: 01/24/2022 CLINICAL DATA:  Pt fell backwards off of first stair.Pt complaining of right hip pain and refuses any attempt to adjust for positioning. Best images obtainable EXAM: CHEST  1 VIEW COMPARISON:  12/18/2019 FINDINGS: Relatively low lung volumes. No confluent airspace infiltrate or overt edema. Heart size and mediastinal contours are within normal limits. Aortic Atherosclerosis (ICD10-170.0). No effusion. Visualized bones unremarkable. IMPRESSION: No acute cardiopulmonary disease. Electronically Signed   By: Lucrezia Europe M.D.   On: 01/24/2022 17:01   DG Hip Unilat  With Pelvis 2-3 Views Right  Result Date: 01/24/2022 CLINICAL DATA:  Pain post fall EXAM: DG HIP (WITH OR WITHOUT PELVIS) 2-3V RIGHT COMPARISON:  CT 07/12/2020 FINDINGS: Mildly comminuted intertrochanteric fracture of the proximal right femur with varus deformity and foreshortening. No dislocation. Bony pelvis intact. Surgical clips in the left pelvis. Mild spondylitic changes in the visualized lower lumbar spine. IMPRESSION: Mildly comminuted intertrochanteric right femur fracture. Electronically Signed   By: Lucrezia Europe M.D.   On: 01/24/2022 16:50    Positive ROS: All other systems have been  reviewed and were otherwise negative with the exception of those mentioned in the HPI and as above.  Physical Exam: General: Alert, no acute distress Cardiovascular: No pedal edema Respiratory: No cyanosis, no use of accessory musculature GI: No organomegaly, abdomen is soft and non-tender Skin: No lesions in the area of chief complaint Neurologic: Sensation intact distally Psychiatric: Patient is competent for consent with normal mood and affect Lymphatic: No axillary or cervical lymphadenopathy  MUSCULOSKELETAL: tenderness to palpation about the right hip. Compartments soft. Good cap refill. Motor and sensory intact distally.  Assessment: 81 year old female admitted with a displaced right intertrochanteric hip fracture  Plan: I had a discussion with the patient as well as with her son over the telephone regarding both operative and nonoperative treatment options and risk and benefits of each.  Recommendation was made for right hip IM nail.  Informed consent was provided by the patient's son.    Renee Harder, MD    01/25/2022 3:00 PM

## 2022-01-25 NOTE — Progress Notes (Signed)
Patient slow to respond, "stiff'  difficult to assess spinal. Patient did received ketamine in OR, Dr. Rosey Bath  anesthesia made aware, will see patient at bedside in pacu, will continue to monitor closely.

## 2022-01-26 ENCOUNTER — Encounter: Payer: Self-pay | Admitting: Orthopaedic Surgery

## 2022-01-26 DIAGNOSIS — S72141A Displaced intertrochanteric fracture of right femur, initial encounter for closed fracture: Secondary | ICD-10-CM | POA: Diagnosis not present

## 2022-01-26 LAB — BASIC METABOLIC PANEL
Anion gap: 5 (ref 5–15)
BUN: 19 mg/dL (ref 8–23)
CO2: 24 mmol/L (ref 22–32)
Calcium: 8.5 mg/dL — ABNORMAL LOW (ref 8.9–10.3)
Chloride: 107 mmol/L (ref 98–111)
Creatinine, Ser: 1.05 mg/dL — ABNORMAL HIGH (ref 0.44–1.00)
GFR, Estimated: 53 mL/min — ABNORMAL LOW (ref >60)
Glucose, Bld: 160 mg/dL — ABNORMAL HIGH (ref 70–99)
Potassium: 3 mmol/L — ABNORMAL LOW (ref 3.5–5.1)
Sodium: 136 mmol/L (ref 135–145)

## 2022-01-26 LAB — CBC
HCT: 34.9 % — ABNORMAL LOW (ref 36.0–46.0)
Hemoglobin: 11.8 g/dL — ABNORMAL LOW (ref 12.0–15.0)
MCH: 32 pg (ref 26.0–34.0)
MCHC: 33.8 g/dL (ref 30.0–36.0)
MCV: 94.6 fL (ref 80.0–100.0)
Platelets: 109 10*3/uL — ABNORMAL LOW (ref 150–400)
RBC: 3.69 MIL/uL — ABNORMAL LOW (ref 3.87–5.11)
RDW: 12.1 % (ref 11.5–15.5)
WBC: 18.8 10*3/uL — ABNORMAL HIGH (ref 4.0–10.5)
nRBC: 0 % (ref 0.0–0.2)

## 2022-01-26 LAB — BLOOD GAS, ARTERIAL
Acid-base deficit: 0.2 mmol/L (ref 0.0–2.0)
Bicarbonate: 22.1 mmol/L (ref 20.0–28.0)
O2 Content: 2 L/min
O2 Saturation: 98.7 %
Patient temperature: 37
pCO2 arterial: 29 mmHg — ABNORMAL LOW (ref 32–48)
pH, Arterial: 7.49 — ABNORMAL HIGH (ref 7.35–7.45)
pO2, Arterial: 75 mmHg — ABNORMAL LOW (ref 83–108)

## 2022-01-26 MED ORDER — POTASSIUM CHLORIDE IN NACL 40-0.9 MEQ/L-% IV SOLN
INTRAVENOUS | Status: DC
  Filled 2022-01-26 (×2): qty 1000

## 2022-01-26 MED ORDER — POTASSIUM CHLORIDE CRYS ER 20 MEQ PO TBCR
40.0000 meq | EXTENDED_RELEASE_TABLET | Freq: Once | ORAL | Status: AC
  Administered 2022-01-26: 40 meq via ORAL
  Filled 2022-01-26: qty 2

## 2022-01-26 MED ORDER — PANTOPRAZOLE SODIUM 40 MG PO TBEC
40.0000 mg | DELAYED_RELEASE_TABLET | Freq: Every day | ORAL | Status: DC
  Administered 2022-01-26 – 2022-02-09 (×8): 40 mg via ORAL
  Filled 2022-01-26 (×12): qty 1

## 2022-01-26 NOTE — Plan of Care (Signed)
Problem: Education: Goal: Knowledge of General Education information will improve Description: Including pain rating scale, medication(s)/side effects and non-pharmacologic comfort measures 01/26/2022 0636 by Balinda Quails, LPN Outcome: Progressing 01/26/2022 0425 by Balinda Quails, LPN Outcome: Not Progressing 01/26/2022 0424 by Balinda Quails, LPN Outcome: Progressing   Problem: Health Behavior/Discharge Planning: Goal: Ability to manage health-related needs will improve 01/26/2022 0636 by Balinda Quails, LPN Outcome: Progressing 01/26/2022 0425 by Balinda Quails, LPN Outcome: Not Progressing 01/26/2022 0424 by Balinda Quails, LPN Outcome: Progressing   Problem: Clinical Measurements: Goal: Ability to maintain clinical measurements within normal limits will improve 01/26/2022 0636 by Balinda Quails, LPN Outcome: Progressing 01/26/2022 0425 by Balinda Quails, LPN Outcome: Not Progressing 01/26/2022 0424 by Balinda Quails, LPN Outcome: Progressing Goal: Will remain free from infection 01/26/2022 0636 by Balinda Quails, LPN Outcome: Progressing 01/26/2022 0425 by Balinda Quails, LPN Outcome: Not Progressing 01/26/2022 0424 by Balinda Quails, LPN Outcome: Progressing Goal: Diagnostic test results will improve 01/26/2022 0636 by Balinda Quails, LPN Outcome: Progressing 01/26/2022 0425 by Balinda Quails, LPN Outcome: Not Progressing 01/26/2022 0424 by Balinda Quails, LPN Outcome: Progressing Goal: Respiratory complications will improve 01/26/2022 0636 by Balinda Quails, LPN Outcome: Progressing 01/26/2022 0425 by Balinda Quails, LPN Outcome: Not Progressing 01/26/2022 0424 by Balinda Quails, LPN Outcome: Progressing Goal: Cardiovascular complication will be avoided 01/26/2022 0636 by Balinda Quails, LPN Outcome: Progressing 01/26/2022 0425 by Balinda Quails, LPN Outcome: Not Progressing 01/26/2022 0424 by Balinda Quails, LPN Outcome: Progressing   Problem:  Activity: Goal: Risk for activity intolerance will decrease 01/26/2022 0636 by Balinda Quails, LPN Outcome: Progressing 01/26/2022 0425 by Balinda Quails, LPN Outcome: Not Progressing 01/26/2022 0424 by Balinda Quails, LPN Outcome: Progressing   Problem: Nutrition: Goal: Adequate nutrition will be maintained 01/26/2022 0636 by Balinda Quails, LPN Outcome: Progressing 01/26/2022 0425 by Balinda Quails, LPN Outcome: Not Progressing 01/26/2022 0424 by Balinda Quails, LPN Outcome: Progressing   Problem: Coping: Goal: Level of anxiety will decrease 01/26/2022 0636 by Balinda Quails, LPN Outcome: Progressing 01/26/2022 0425 by Balinda Quails, LPN Outcome: Not Progressing 01/26/2022 0424 by Balinda Quails, LPN Outcome: Progressing   Problem: Elimination: Goal: Will not experience complications related to bowel motility 01/26/2022 0636 by Balinda Quails, LPN Outcome: Progressing 01/26/2022 0425 by Balinda Quails, LPN Outcome: Not Progressing 01/26/2022 0424 by Balinda Quails, LPN Outcome: Progressing Goal: Will not experience complications related to urinary retention 01/26/2022 0636 by Balinda Quails, LPN Outcome: Progressing 01/26/2022 0425 by Balinda Quails, LPN Outcome: Not Progressing 01/26/2022 0424 by Balinda Quails, LPN Outcome: Progressing   Problem: Pain Managment: Goal: General experience of comfort will improve 01/26/2022 0636 by Balinda Quails, LPN Outcome: Progressing 01/26/2022 0425 by Balinda Quails, LPN Outcome: Not Progressing 01/26/2022 0424 by Balinda Quails, LPN Outcome: Progressing   Problem: Safety: Goal: Ability to remain free from injury will improve 01/26/2022 0636 by Balinda Quails, LPN Outcome: Progressing 01/26/2022 0425 by Balinda Quails, LPN Outcome: Not Progressing 01/26/2022 0424 by Balinda Quails, LPN Outcome: Progressing   Problem: Skin Integrity: Goal: Risk for impaired skin integrity will decrease 01/26/2022 0636 by Balinda Quails, LPN Outcome: Progressing 01/26/2022 0425 by Balinda Quails, LPN Outcome: Not Progressing 01/26/2022 0424 by Balinda Quails, LPN Outcome: Progressing   Problem: Education: Goal: Verbalization of understanding the information provided (i.e., activity  precautions, restrictions, etc) will improve 01/26/2022 0636 by Balinda Quails, LPN Outcome: Progressing 01/26/2022 0425 by Balinda Quails, LPN Outcome: Not Progressing 01/26/2022 0424 by Balinda Quails, LPN Outcome: Progressing Goal: Individualized Educational Video(s) 01/26/2022 0636 by Balinda Quails, LPN Outcome: Progressing 01/26/2022 0425 by Balinda Quails, LPN Outcome: Not Progressing 01/26/2022 0424 by Balinda Quails, LPN Outcome: Progressing   Problem: Activity: Goal: Ability to ambulate and perform ADLs will improve 01/26/2022 0636 by Balinda Quails, LPN Outcome: Progressing 01/26/2022 0425 by Balinda Quails, LPN Outcome: Not Progressing 01/26/2022 0424 by Balinda Quails, LPN Outcome: Progressing   Problem: Clinical Measurements: Goal: Postoperative complications will be avoided or minimized 01/26/2022 0636 by Balinda Quails, LPN Outcome: Progressing 01/26/2022 0425 by Balinda Quails, LPN Outcome: Not Progressing 01/26/2022 0424 by Balinda Quails, LPN Outcome: Progressing   Problem: Self-Concept: Goal: Ability to maintain and perform role responsibilities to the fullest extent possible will improve 01/26/2022 0636 by Balinda Quails, LPN Outcome: Progressing 01/26/2022 0425 by Balinda Quails, LPN Outcome: Not Progressing 01/26/2022 0424 by Balinda Quails, LPN Outcome: Progressing   Problem: Pain Management: Goal: Pain level will decrease 01/26/2022 0636 by Balinda Quails, LPN Outcome: Progressing 01/26/2022 0425 by Balinda Quails, LPN Outcome: Not Progressing 01/26/2022 0424 by Balinda Quails, LPN Outcome: Not Progressing

## 2022-01-26 NOTE — Care Management Important Message (Signed)
Important Message  Patient Details  Name: Katie Bell MRN: [id number] Date of Birth: 1940-10-09   Medicare Important Message Given:  N/A - LOS <3 / Initial given by admissions     Dannette Barbara 01/26/2022, 9:22 AM

## 2022-01-26 NOTE — Progress Notes (Signed)
PT Cancellation Note  Patient Details Name: Katie Bell MRN: [id number] DOB: 18-Mar-1941   Cancelled Treatment:    Reason Eval/Treat Not Completed: Fatigue/lethargy limiting ability to participate;[name] level of consciousness (Per NSG pt fairly confused this morning, not following commands. WIll attempt evaluation again in afternoon.)  10:56 AM, 01/26/22 Etta Grandchild, PT, DPT Physical Therapist - [facility]  [phone] ([place])    Jakobee Brackins C 01/26/2022, 10:56 AM

## 2022-01-26 NOTE — Progress Notes (Signed)
PROGRESS NOTE    Katie Bell  FTD:[id number] DOB: [birth year] DOA: 01/24/2022 PCP: Venia Carbon, MD    Brief Narrative:  81 y.o. female with medical history significant of essential hypertension, anxiety disorder, previous history of basal cell carcinoma, GERD and kidney stones who presents to the ER after sustaining a mechanical fall at home.  Patient apparently slipped and fell down her stairs.  Sustained pain on the right hip with noticeable deformity.  Did not hit her head.  No evidence of other bodily injuries.  Patient brought to the ER where she was noted to have right intertrochanteric fracture.  Orthopedics consulted and plan to do operative repair 10/5.  Patient being admitted to the medical service.  She has no new complaints.  Pain is well controlled for the most part.  10/6: Status post IM nail for right hip fracture.  Seen by orthopedics.  Okay for DVT prophylaxis.  Patient a bit more confused this morning.   Assessment & Plan:   Principal Problem:   Closed right femoral fracture [facility]) Active Problems:   Hypertension   Leucocytosis   Fall at home, initial encounter  Right intertrochanteric hip fracture Secondary to mechanical fall.  No evidence of syncopal event.  Medically stable at this time.  Status post IM nail for right intertrochanteric hip fracture on 10/5.  Tolerated procedure well. Plan: Postoperative care Wound checks per Ortho Dressing changes by nursing staff Prophylaxis aspirin 81 mg twice daily Therapy evaluations Out of bed as tolerated  Altered mentation Unclear etiology.  Could be related to hospitalization versus recent surgery. ABG reassuring, no CO2 retention Plan: Delirium precautions Ensure pain control  Essential hypertension Continue home amlodipine  Hypokalemia Monitor and replace as necessary  Leukocytosis Suspect secondary to stress reaction.  No obvious source of infection.  Will monitor white count  GERD Twice daily  PPI for now   DVT prophylaxis: On hold Code Status: Full Family Communication: Lauro Regulus [phone] on 10/5, 10/6 Disposition Plan: Status is: Inpatient Remains inpatient appropriate because: Hip fracture.  Operative repair planned 10/5.  Will likely need skilled nursing facility.   Level of care: Med-Surg  Consultants:  Orthopedic surgery  Procedures:  Hip fracture IM nail 10/5  Antimicrobials: None   Subjective: Patient seen and examined.  Somewhat confused this AM.    Objective: Vitals:   01/25/22 2048 01/26/22 0025 01/26/22 0424 01/26/22 0843  BP: (!) 126/56 (!) 116/58 129/82 (!) 154/73  Pulse: 99 98 (!) 108 (!) 102  Resp: '18 16 18 16  '$ Temp: 98.8 F (37.1 C) 98.4 F (36.9 C) 97.8 F (36.6 C) 99 F (37.2 C)  TempSrc:  Axillary    SpO2: 97% 97% 98% 98%  Weight:      Height:        Intake/Output Summary (Last 24 hours) at 01/26/2022 1327 Last data filed at 01/26/2022 5176 Gross per 24 hour  Intake 2434.4 ml  Output 2150 ml  Net 284.4 ml   Filed Weights   01/24/22 1614  Weight: 49.9 kg    Examination:  General exam: NAD.  Confused Respiratory system: Clear to auscultation. Respiratory effort normal. Cardiovascular system: S1 and S2, RRR, no murmurs, no pedal edema Gastrointestinal system: Soft, NT/ND, normal bowel sounds Central nervous system: Alert and oriented. No focal neurological deficits. Extremities: Hip surgical dressing CDI Skin: No rashes, lesions or ulcers Psychiatry: Judgement and insight appear normal. Mood & affect confused.     Data Reviewed: I have personally reviewed following  labs and imaging studies  CBC: Recent Labs  Lab 01/24/22 1623 01/25/22 0527 01/25/22 1320 01/26/22 0455  WBC 11.8* 15.2*  --  18.8*  NEUTROABS 10.0*  --   --   --   HGB 13.7 14.4 13.6 11.8*  HCT 39.9 42.3 40.4 34.9*  MCV 93.0 94.0  --  94.6  PLT 192 193  --  761*   Basic Metabolic Panel: Recent Labs  Lab 01/24/22 1942 01/25/22 0527  01/26/22 0455  NA 138 138 136  K 3.2* 3.8 3.0*  CL 106 104 107  CO2 20* 23 24  GLUCOSE 184* 200* 160*  BUN '16 17 19  '$ CREATININE 0.88 0.93 1.05*  CALCIUM 9.3 9.5 8.5*   GFR: Estimated Creatinine Clearance: 29.7 mL/min (A) (by C-G formula based on SCr of 1.05 mg/dL (H)). Liver Function Tests: Recent Labs  Lab 01/24/22 1942 01/25/22 0527  AST 29 31  ALT 16 23  ALKPHOS 51 51  BILITOT 1.0 1.1  PROT 7.4 7.6  ALBUMIN 4.2 4.2   No results for input(s): "LIPASE", "AMYLASE" in the last 168 hours. No results for input(s): "AMMONIA" in the last 168 hours. Coagulation Profile: Recent Labs  Lab 01/25/22 0527  INR 1.0   Cardiac Enzymes: No results for input(s): "CKTOTAL", "CKMB", "CKMBINDEX", "TROPONINI" in the last 168 hours. BNP (last 3 results) No results for input(s): "PROBNP" in the last 8760 hours. HbA1C: No results for input(s): "HGBA1C" in the last 72 hours. CBG: No results for input(s): "GLUCAP" in the last 168 hours. Lipid Profile: No results for input(s): "CHOL", "HDL", "LDLCALC", "TRIG", "CHOLHDL", "LDLDIRECT" in the last 72 hours. Thyroid Function Tests: No results for input(s): "TSH", "T4TOTAL", "FREET4", "T3FREE", "THYROIDAB" in the last 72 hours. Anemia Panel: No results for input(s): "VITAMINB12", "FOLATE", "FERRITIN", "TIBC", "IRON", "RETICCTPCT" in the last 72 hours. Sepsis Labs: No results for input(s): "PROCALCITON", "LATICACIDVEN" in the last 168 hours.  No results found for this or any previous visit (from the past 240 hour(s)).       Radiology Studies: DG HIP UNILAT WITH PELVIS 2-3 VIEWS RIGHT  Result Date: 01/25/2022 CLINICAL DATA:  Intramedullary nail of right hip. Intraoperative fluoroscopy. EXAM: DG HIP (WITH OR WITHOUT PELVIS) 2-3V RIGHT COMPARISON:  Right hip radiographs 01/24/2022 FINDINGS: Images were performed intraoperatively without the presence of a radiologist. Interval cephalomedullary nail fixation of the previously seen right  intertrochanteric fracture. Improved now normal alignment. No evidence of hardware complication. Total fluoroscopy images: 2 Total fluoroscopy time: 35 seconds Total dose: Radiation Exposure Index (as provided by the fluoroscopic device): 3.189 mGy air Kerma Please see intraoperative findings for further detail. IMPRESSION: Intraoperative fluoroscopy for right intertrochanteric fracture fixation. Electronically Signed   By: Yvonne Kendall M.D.   On: 01/25/2022 18:34   DG C-Arm 1-60 Min-No Report  Result Date: 01/25/2022 Fluoroscopy was utilized by the requesting physician.  No radiographic interpretation.   CT Head Wo Contrast  Result Date: 01/24/2022 CLINICAL DATA:  Fall EXAM: CT HEAD WITHOUT CONTRAST CT CERVICAL SPINE WITHOUT CONTRAST TECHNIQUE: Multidetector CT imaging of the head and cervical spine was performed following the standard protocol without intravenous contrast. Multiplanar CT image reconstructions of the cervical spine were also generated. RADIATION DOSE REDUCTION: This exam was performed according to the departmental dose-optimization program which includes automated exposure control, adjustment of the mA and/or kV according to patient size and/or use of iterative reconstruction technique. COMPARISON:  None Available. FINDINGS: CT HEAD FINDINGS Brain: No evidence of acute infarction, hemorrhage, hydrocephalus, extra-axial collection  or mass lesion/mass effect. Subcortical white matter and periventricular small vessel ischemic changes. Vascular: Intracranial atherosclerosis. Skull: Normal. Negative for fracture or focal lesion. Sinuses/Orbits: The visualized paranasal sinuses are essentially clear. The mastoid air cells are unopacified. Other: None. CT CERVICAL SPINE FINDINGS Alignment: Normal cervical lordosis. Skull base and vertebrae: No acute fracture. No primary bone lesion or focal pathologic process. Soft tissues and spinal canal: No prevertebral fluid or swelling. No visible canal  hematoma. Disc levels: Very mild degenerative changes of the mid/lower cervical spine. Spinal canal is patent. Upper chest: Visualized lung apices are clear. Other: Visualized thyroid is unremarkable. IMPRESSION: No evidence of acute intracranial abnormality. Small vessel ischemic changes. No traumatic injury to the cervical spine. Electronically Signed   By: Julian Hy M.D.   On: 01/24/2022 20:30   CT Cervical Spine Wo Contrast  Result Date: 01/24/2022 CLINICAL DATA:  Fall EXAM: CT HEAD WITHOUT CONTRAST CT CERVICAL SPINE WITHOUT CONTRAST TECHNIQUE: Multidetector CT imaging of the head and cervical spine was performed following the standard protocol without intravenous contrast. Multiplanar CT image reconstructions of the cervical spine were also generated. RADIATION DOSE REDUCTION: This exam was performed according to the departmental dose-optimization program which includes automated exposure control, adjustment of the mA and/or kV according to patient size and/or use of iterative reconstruction technique. COMPARISON:  None Available. FINDINGS: CT HEAD FINDINGS Brain: No evidence of acute infarction, hemorrhage, hydrocephalus, extra-axial collection or mass lesion/mass effect. Subcortical white matter and periventricular small vessel ischemic changes. Vascular: Intracranial atherosclerosis. Skull: Normal. Negative for fracture or focal lesion. Sinuses/Orbits: The visualized paranasal sinuses are essentially clear. The mastoid air cells are unopacified. Other: None. CT CERVICAL SPINE FINDINGS Alignment: Normal cervical lordosis. Skull base and vertebrae: No acute fracture. No primary bone lesion or focal pathologic process. Soft tissues and spinal canal: No prevertebral fluid or swelling. No visible canal hematoma. Disc levels: Very mild degenerative changes of the mid/lower cervical spine. Spinal canal is patent. Upper chest: Visualized lung apices are clear. Other: Visualized thyroid is unremarkable.  IMPRESSION: No evidence of acute intracranial abnormality. Small vessel ischemic changes. No traumatic injury to the cervical spine. Electronically Signed   By: Julian Hy M.D.   On: 01/24/2022 20:30   DG Chest 1 View  Result Date: 01/24/2022 CLINICAL DATA:  Pt fell backwards off of first stair.Pt complaining of right hip pain and refuses any attempt to adjust for positioning. Best images obtainable EXAM: CHEST  1 VIEW COMPARISON:  12/18/2019 FINDINGS: Relatively low lung volumes. No confluent airspace infiltrate or overt edema. Heart size and mediastinal contours are within normal limits. Aortic Atherosclerosis (ICD10-170.0). No effusion. Visualized bones unremarkable. IMPRESSION: No acute cardiopulmonary disease. Electronically Signed   By: Lucrezia Europe M.D.   On: 01/24/2022 17:01   DG Hip Unilat  With Pelvis 2-3 Views Right  Result Date: 01/24/2022 CLINICAL DATA:  Pain post fall EXAM: DG HIP (WITH OR WITHOUT PELVIS) 2-3V RIGHT COMPARISON:  CT 07/12/2020 FINDINGS: Mildly comminuted intertrochanteric fracture of the proximal right femur with varus deformity and foreshortening. No dislocation. Bony pelvis intact. Surgical clips in the left pelvis. Mild spondylitic changes in the visualized lower lumbar spine. IMPRESSION: Mildly comminuted intertrochanteric right femur fracture. Electronically Signed   By: Lucrezia Europe M.D.   On: 01/24/2022 16:50        Scheduled Meds:  amLODipine  5 mg Oral Daily   aspirin EC  81 mg Oral BID   docusate sodium  100 mg Oral BID   pantoprazole  40 mg Oral Daily   Continuous Infusions:  tranexamic acid       LOS: 2 days     Sidney Ace, MD [facility]   If 7PM-7AM, please contact night-coverage  01/26/2022, 1:27 PM

## 2022-01-26 NOTE — Progress Notes (Signed)
VAST consulted to obtain IV access. Assessed patient's arms bilaterally utilizing ultrasound. No appropriate veins visualized for IV placement. Patient in need of fluids and protonix. Advised unit nurse of findings and suggested she call pharmacy to obtain subcutaneous needle and discuss with pharmacy ability to infuse fluids via this route. Olivia Mackie, LPN verbalized understanding.

## 2022-01-26 NOTE — Anesthesia Postprocedure Evaluation (Signed)
Anesthesia Post Note  Patient: Katie Bell  Procedure(s) Performed: INTRAMEDULLARY (IM) NAIL INTERTROCHANTERIC (Right: Hip)  Patient location during evaluation: Nursing Unit Anesthesia Type: Spinal Level of consciousness: awake and alert and patient cooperative Pain management: pain level controlled Vital Signs Assessment: post-procedure vital signs reviewed and stable Respiratory status: spontaneous breathing and respiratory function stable Cardiovascular status: blood pressure returned to baseline and stable Postop Assessment: no headache, no backache, no apparent nausea or vomiting and patient able to bend at knees Anesthetic complications: no   No notable events documented.   Last Vitals:  Vitals:   01/26/22 0424 01/26/22 0843  BP: 129/82 (!) 154/73  Pulse: (!) 108 (!) 102  Resp: 18 16  Temp: 36.6 C 37.2 C  SpO2: 98% 98%    Last Pain:  Vitals:   01/26/22 0025  TempSrc: Axillary  PainSc:                  Lia Foyer

## 2022-01-26 NOTE — Progress Notes (Signed)
PT Cancellation Note  Patient Details Name: Katie Bell MRN: [id number] DOB: 1940/09/08   Cancelled Treatment:    Reason Eval/Treat Not Completed: Fatigue/lethargy limiting ability to participate (Pt somnolent, lethargic, interactive, but perseverative on "thank you, sir," or "thank you, Jesus," for several minutes. Pt slightly trying to crawl out of bed, but once repositioned, appears more restful and less restless.) Distal surgical wound is visualized without dressing, steristrips loose in bed, honeycomb dressing on floor. Pt is picking at her lower lip scab. RN made aware of undressed wound. Will defer PT evaluation until later date once mentation improves.   2:06 PM, 01/26/22 Etta Grandchild, PT, DPT Physical Therapist - [facility]  [phone] ([place])     [name] 01/26/2022, 2:04 PM

## 2022-01-26 NOTE — Progress Notes (Addendum)
OT Cancellation Note  Patient Details Name: ZELIE ASBILL MRN: [id number] DOB: 09/12/1940   Cancelled Treatment:    Reason Eval/Treat Not Completed: Other (comment). Consult received, chart reviewed. Per RN, pt confused this morning, unable to follow commands. RN agreeable to OT holding this morning and re-attempting at later time.  Addendum 2:02pm: On 2nd attempt, pt remains confused, unable to participate. Per PT, pt removed honeycomb bandaging. RN notified. Will re-attempt next date as appropriate.    Ardeth Perfect., MPH, MS, OTR/L ascom [phone] 01/26/22, 8:02 AM

## 2022-01-26 NOTE — Progress Notes (Signed)
     Subjective: 1 Day Post-Op Procedure(s) (LRB): INTRAMEDULLARY (IM) NAIL INTERTROCHANTERIC (Right)   Patient reports pain as mild, pain appears to be controlled. No reported events throughout the night. Patient resting comfortably in bed.     Objective:   VITALS:   Vitals:   01/26/22 0025 01/26/22 0424  BP: (!) 116/58 129/82  Pulse: 98 (!) 108  Resp: 16 18  Temp: 98.4 F (36.9 C) 97.8 F (36.6 C)  SpO2: 97% 98%    Neurovascular intact Sensation intact distally Dorsiflexion/Plantar flexion intact Incision: dressing C/D/I No cellulitis present Compartment soft  LABS Recent Labs    01/24/22 1623 01/25/22 0527 01/25/22 1320 01/26/22 0455  HGB 13.7 14.4 13.6 11.8*  HCT 39.9 42.3 40.4 34.9*  WBC 11.8* 15.2*  --  18.8*  PLT 192 193  --  109*    Recent Labs    01/24/22 1942 01/25/22 0527 01/26/22 0455  NA 138 138 136  K 3.2* 3.8 3.0*  BUN '16 17 19  '$ CREATININE 0.88 0.93 1.05*  GLUCOSE 184* 200* 160*     Assessment/Plan: 1 Day Post-Op Procedure(s) (LRB): INTRAMEDULLARY (IM) NAIL INTERTROCHANTERIC (Right)   Advance diet Up with therapy Plan for discharge when medically ready  POSTOPERATIVE PLAN: She will be weightbearing as tolerated.   Ok to start DVT ppx POD#1- aspirin '81mg'$  BID Dressing change by nursing staff as needed to keep dressing clean and dry Outpatient f/u in clinic in 2 weeks for staple removal and xrays      [place] PA-C  [place]

## 2022-01-27 DIAGNOSIS — S72141A Displaced intertrochanteric fracture of right femur, initial encounter for closed fracture: Secondary | ICD-10-CM | POA: Diagnosis not present

## 2022-01-27 LAB — CBC WITH DIFFERENTIAL/PLATELET
Abs Immature Granulocytes: 0.07 10*3/uL (ref 0.00–0.07)
Basophils Absolute: 0 10*3/uL (ref 0.0–0.1)
Basophils Relative: 0 %
Eosinophils Absolute: 0 10*3/uL (ref 0.0–0.5)
Eosinophils Relative: 0 %
HCT: 33.9 % — ABNORMAL LOW (ref 36.0–46.0)
Hemoglobin: 11.6 g/dL — ABNORMAL LOW (ref 12.0–15.0)
Immature Granulocytes: 0 %
Lymphocytes Relative: 3 %
Lymphs Abs: 0.5 10*3/uL — ABNORMAL LOW (ref 0.7–4.0)
MCH: 31.8 pg (ref 26.0–34.0)
MCHC: 34.2 g/dL (ref 30.0–36.0)
MCV: 92.9 fL (ref 80.0–100.0)
Monocytes Absolute: 1 10*3/uL (ref 0.1–1.0)
Monocytes Relative: 6 %
Neutro Abs: 14.8 10*3/uL — ABNORMAL HIGH (ref 1.7–7.7)
Neutrophils Relative %: 91 %
Platelets: 126 10*3/uL — ABNORMAL LOW (ref 150–400)
RBC: 3.65 MIL/uL — ABNORMAL LOW (ref 3.87–5.11)
RDW: 11.9 % (ref 11.5–15.5)
WBC: 16.3 10*3/uL — ABNORMAL HIGH (ref 4.0–10.5)
nRBC: 0 % (ref 0.0–0.2)

## 2022-01-27 LAB — BASIC METABOLIC PANEL
Anion gap: 10 (ref 5–15)
Anion gap: 8 (ref 5–15)
BUN: 19 mg/dL (ref 8–23)
BUN: 23 mg/dL (ref 8–23)
CO2: 21 mmol/L — ABNORMAL LOW (ref 22–32)
CO2: 23 mmol/L (ref 22–32)
Calcium: 9.1 mg/dL (ref 8.9–10.3)
Calcium: 9 mg/dL (ref 8.9–10.3)
Chloride: 108 mmol/L (ref 98–111)
Chloride: 109 mmol/L (ref 98–111)
Creatinine, Ser: 0.91 mg/dL (ref 0.44–1.00)
Creatinine, Ser: 0.98 mg/dL (ref 0.44–1.00)
GFR, Estimated: >60 mL/min (ref >60)
GFR, Estimated: 58 mL/min — ABNORMAL LOW (ref >60)
Glucose, Bld: 152 mg/dL — ABNORMAL HIGH (ref 70–99)
Glucose, Bld: 146 mg/dL — ABNORMAL HIGH (ref 70–99)
Potassium: 3.2 mmol/L — ABNORMAL LOW (ref 3.5–5.1)
Potassium: 3 mmol/L — ABNORMAL LOW (ref 3.5–5.1)
Sodium: 139 mmol/L (ref 135–145)
Sodium: 140 mmol/L (ref 135–145)

## 2022-01-27 LAB — CBC
HCT: 34.6 % — ABNORMAL LOW (ref 36.0–46.0)
Hemoglobin: 11.8 g/dL — ABNORMAL LOW (ref 12.0–15.0)
MCH: 31.9 pg (ref 26.0–34.0)
MCHC: 34.1 g/dL (ref 30.0–36.0)
MCV: 93.5 fL (ref 80.0–100.0)
Platelets: 128 10*3/uL — ABNORMAL LOW (ref 150–400)
RBC: 3.7 MIL/uL — ABNORMAL LOW (ref 3.87–5.11)
RDW: 11.9 % (ref 11.5–15.5)
WBC: 17.5 10*3/uL — ABNORMAL HIGH (ref 4.0–10.5)
nRBC: 0 % (ref 0.0–0.2)

## 2022-01-27 LAB — GLUCOSE, CAPILLARY: Glucose-Capillary: 145 mg/dL — ABNORMAL HIGH (ref 70–99)

## 2022-01-27 LAB — MAGNESIUM: Magnesium: 2.3 mg/dL (ref 1.7–2.4)

## 2022-01-27 NOTE — NC FL2 (Signed)
[place] LEVEL OF CARE SCREENING TOOL     IDENTIFICATION  Patient Name: Katie Bell Birthdate: May 13, 1940 Sex: female Admission Date (Current Location): 01/24/2022  [facility] and [place] Number:  Engineering geologist and Address:  [facility], [address] [place], [place] [address]      Provider Number: [id number]  Attending Physician Name and Address:  Sidney Ace, MD  Relative Name and Phone Number:  mark Thedore Mins [phone]    Current Level of Care: Hospital Recommended Level of Care:   Prior Approval Number:    Date Approved/Denied:   PASRR Number:    Discharge Plan: SNF    Current Diagnoses: Patient Active Problem List   Diagnosis Date Noted   Leucocytosis 01/24/2022   Fall at home, initial encounter 01/24/2022   Closed right femoral fracture ([place]) 01/24/2022   Tinnitus 08/10/2021   Malnutrition of mild degree ([place]) 10/26/2020   External hemorrhoid, bleeding 07/21/2020   Adnexal mass 07/21/2020   Left ovarian cyst 01/05/2020   Visit for preventive health examination 02/16/2014   Advanced directives, counseling/discussion 02/16/2014   Female cystocele 11/02/2013   Hypertension    Gastric ulcer    Episodic mood disorder (HCC)    Osteoarthritis, multiple sites     Orientation RESPIRATION BLADDER Height & Weight     Self  Normal Incontinent Weight: 110 lb (49.9 kg) Height:  5' (152.4 cm)  BEHAVIORAL SYMPTOMS/MOOD NEUROLOGICAL BOWEL NUTRITION STATUS      Continent Diet  AMBULATORY STATUS COMMUNICATION OF NEEDS Skin   Extensive Assist Verbally Normal                       Personal Care Assistance Level of Assistance  Bathing, Feeding, Dressing Bathing Assistance: Maximum assistance Feeding assistance: Limited assistance Dressing Assistance: Maximum assistance     Functional Limitations Info  Sight, Hearing, Speech Sight Info: Adequate Hearing Info: Adequate Speech Info: Impaired     SPECIAL CARE FACTORS FREQUENCY  PT (By licensed PT), OT (By licensed OT)     PT Frequency: 5x per week OT Frequency: 5x per week            Contractures Contractures Info: Not present    Additional Factors Info  Allergies Code Status Info: full code Allergies Info: sulfamethoxazole, estradiol, morphine, sertraline           Current Medications (01/27/2022):  This is the current hospital active medication list Current Facility-Administered Medications  Medication Dose Route Frequency Provider Last Rate Last Admin   acetaminophen (TYLENOL) tablet 650 mg  650 mg Oral Q6H PRN Renee Harder, MD   650 mg at 01/26/22 1026   amLODipine (NORVASC) tablet 5 mg  5 mg Oral Daily Renee Harder, MD   5 mg at 01/26/22 1027   aspirin EC tablet 81 mg  81 mg Oral BID Renee Harder, MD   81 mg at 01/26/22 1026   docusate sodium (COLACE) capsule 100 mg  100 mg Oral BID Renee Harder, MD   100 mg at 01/26/22 1026   HYDROmorphone (DILAUDID) injection 0.5 mg  0.5 mg Intravenous Q3H PRN Renee Harder, MD   0.5 mg at 01/25/22 1252   HYDROmorphone (DILAUDID) injection 0.5-1 mg  0.5-1 mg Intravenous Q4H PRN Renee Harder, MD   0.5 mg at 01/25/22 1842   menthol-cetylpyridinium (CEPACOL) lozenge 3 mg  1 lozenge Oral PRN Renee Harder, MD       Or   phenol [facility]) mouth  spray 1 spray  1 spray Mouth/Throat PRN Renee Harder, MD       metoCLOPramide (REGLAN) tablet 5-10 mg  5-10 mg Oral Q8H PRN Renee Harder, MD       Or   metoCLOPramide (REGLAN) injection 5-10 mg  5-10 mg Intravenous Q8H PRN Renee Harder, MD       ondansetron [facility]) tablet 4 mg  4 mg Oral Q6H PRN Renee Harder, MD       Or   ondansetron [facility]) injection 4 mg  4 mg Intravenous Q6H PRN Renee Harder, MD       oxyCODONE (Oxy IR/ROXICODONE) immediate release tablet 5 mg  5 mg Oral Q4H PRN Renee Harder, MD       pantoprazole (PROTONIX) EC tablet 40 mg  40 mg Oral Daily  Ralene Muskrat B, MD   40 mg at 01/26/22 1410   tranexamic acid (CYKLOKAPRON) IVPB 1,000 mg  1,000 mg Intravenous Once Renee Harder, MD         Discharge Medications: Please see discharge summary for a list of discharge medications.  Relevant Imaging Results:  Relevant Lab Results:   Additional Information [phone]  Elliot Gurney [place], [place]

## 2022-01-27 NOTE — Plan of Care (Signed)
  Problem: Health Behavior/Discharge Planning: Goal: Ability to manage health-related needs will improve Outcome: Progressing   Problem: Clinical Measurements: Goal: Will remain free from infection Outcome: Progressing   Problem: Clinical Measurements: Goal: Respiratory complications will improve Outcome: Progressing   Problem: Elimination: Goal: Will not experience complications related to bowel motility Outcome: Progressing   Problem: Pain Managment: Goal: General experience of comfort will improve Outcome: Progressing   Problem: Activity: Goal: Ability to ambulate and perform ADLs will improve Outcome: Progressing   Problem: Clinical Measurements: Goal: Respiratory complications will improve Outcome: Progressing

## 2022-01-27 NOTE — Evaluation (Signed)
Occupational Therapy Evaluation Patient Details Name: Katie Bell MRN: [id number] DOB: July 31, 1940 Today's Date: 01/27/2022   History of Present Illness 81yo female with PMHx of cancer, anxiety, depression, GERD, HTN who presents to [facility] after falling down her stairs. Brought by EMS on 01/24/22 with obvious deformity of R hip. Now s/p R IM nailing on 01/25/22.   Clinical Impression   Pt was seen for OT evaluation this date. PLOF/home setup updated after session based on phone call PT had with family. Pt with eyes closed, partially undressed, and mitts off in bed upon OT's arrival. Pt alerts to her name but tends to keep her eyes closed throughout session, briefly opening them with VC to do so. Pt follows ~50% of simple commands during session. Pain limited with R hip requiring MAX A for bed mobility and MOD-MAX A to maintain static sitting balance. MAX A to don hospital gown with VC not to pull gown off. Once covered back up pt endorses being more comfortable. RN assisted with MAX A +2 to boost up in bed. Mitts left off, RN aware. Pt presents to acute OT demonstrating impaired ADL performance and functional mobility 2/2 cognition, R hip pain, decreased strength, and balance (See OT problem list). Pt would benefit from skilled OT services to address noted impairments and functional limitations (see below for any additional details) in order to maximize safety and independence while minimizing falls risk and caregiver burden. Upon hospital discharge, recommend STR to maximize pt safety and return to PLOF.     Recommendations for follow up therapy are one component of a multi-disciplinary discharge planning process, led by the attending physician.  Recommendations may be updated based on patient status, additional functional criteria and insurance authorization.   Follow Up Recommendations  Skilled nursing-short term rehab (<3 hours/day)    Assistance Recommended at Discharge Frequent or constant  Supervision/Assistance  Patient can return home with the following A lot of help with walking and/or transfers;A lot of help with bathing/dressing/bathroom;Assistance with cooking/housework;Assist for transportation;Help with stairs or ramp for entrance;Assistance with feeding;Direct supervision/assist for medications management;Direct supervision/assist for financial management    Functional Status Assessment  Patient has had a recent decline in their functional status and demonstrates the ability to make significant improvements in function in a reasonable and predictable amount of time.  Equipment Recommendations  BSC/3in1;Other (comment) (2WW)    Recommendations for Other Services       Precautions / Restrictions Precautions Precautions: Fall Restrictions Weight Bearing Restrictions: Yes RLE Weight Bearing: Weight bearing as tolerated      Mobility Bed Mobility Overal bed mobility: Needs Assistance Bed Mobility: Supine to Sit, Sit to Supine     Supine to sit: Max assist, HOB elevated Sit to supine: Max assist   General bed mobility comments: very slow moving RLE, cues for sequencing    Transfers                   General transfer comment: unsafe to attempt      Balance Overall balance assessment: Needs assistance Sitting-balance support: Bilateral upper extremity supported, Feet unsupported Sitting balance-Leahy Scale: Zero Sitting balance - Comments: MOD-MAX A for maintaining static sitting balance, tendency to offweight R hip 2/2 R hip pain Postural control: Left lateral lean                                 ADL either performed or assessed with clinical  judgement   ADL Overall ADL's : Needs assistance/impaired                                       General ADL Comments: Pt requires MAX A for all ADL at this time     Vision         Perception     Praxis      Pertinent Vitals/Pain Pain Assessment Pain Assessment:  Faces Faces Pain Scale: Hurts little more Pain Location: R hip Pain Descriptors / Indicators: Aching, Moaning, Guarding, Grimacing Pain Intervention(s): Limited activity within patient's tolerance, Monitored during session, Repositioned     Hand Dominance     Extremity/Trunk Assessment Upper Extremity Assessment Upper Extremity Assessment: Generalized weakness;Difficult to assess due to impaired cognition   Lower Extremity Assessment Lower Extremity Assessment: Difficult to assess due to impaired cognition;Generalized weakness;RLE deficits/detail RLE Deficits / Details: pain limited RLE: Unable to fully assess due to pain       Communication     Cognition Arousal/Alertness: Lethargic Behavior During Therapy: Flat affect Overall Cognitive Status: No family/caregiver present to determine baseline cognitive functioning                                 General Comments: Pt keeps eyes closed most of session, does follow ~50% of simple 1-step commands, oriented to self only     General Comments       Exercises     Shoulder Instructions      Home Living Family/patient expects to be discharged to:: Private residence Living Arrangements: Children (Son) Available Help at Discharge: Family;Available PRN/intermittently Type of Home: House Home Access: Stairs to enter [employer] of Steps: 5 Entrance Stairs-Rails: Right;Left Home Layout: One level               Home Equipment: None   Additional Comments: no family present, pt unable to provide PLOF/home setup      Prior Functioning/Environment Prior Level of Function : Patient poor historian/Family not available             Mobility Comments: Per son, mostly homebound, AMB freely without device, went out to store ~1x weekly, did housework/laundry ad lib, still drives short distances, no history of recent falls/balance. ADLs Comments: Independent at baseline        OT Problem List:  Decreased strength;Pain;Decreased cognition;Decreased safety awareness;Impaired balance (sitting and/or standing);Decreased knowledge of use of DME or AE;Decreased knowledge of precautions      OT Treatment/Interventions: Self-care/ADL training;Therapeutic exercise;Therapeutic activities;Cognitive remediation/compensation;DME and/or AE instruction;Patient/family education;Balance training    OT Goals(Current goals can be found in the care plan section) Acute Rehab OT Goals Patient Stated Goal: pt unable to state OT Goal Formulation: Patient unable to participate in goal setting Time For Goal Achievement: 02/10/22 Potential to Achieve Goals: Good ADL Goals Pt Will Perform Grooming: sitting;with min guard assist;with set-up Pt Will Transfer to Toilet: stand pivot transfer;with mod assist;bedside commode Additional ADL Goal #1: Pt will complete bed mobility following >75% simple commands in anticipation of seated ADL, 3/3 opportunities.  OT Frequency: Min 2X/week    Co-evaluation              AM-PAC OT "6 Clicks" Daily Activity     Outcome Measure Help from another person eating meals?: A Lot Help from another person taking care  of personal grooming?: A Lot Help from another person toileting, which includes using toliet, bedpan, or urinal?: Total Help from another person bathing (including washing, rinsing, drying)?: A Lot Help from another person to put on and taking off regular upper body clothing?: A Lot Help from another person to put on and taking off regular lower body clothing?: A Lot 6 Click Score: 11   End of Session Nurse Communication: Mobility status  Activity Tolerance: Patient limited by pain;Other (comment) (limited by cognition) Patient left: in bed;with call bell/phone within reach;with bed alarm set  OT Visit Diagnosis: Other abnormalities of gait and mobility (R26.89);Muscle weakness (generalized) (M62.81);History of falling (Z91.81);Pain Pain - Right/Left:  Right Pain - part of body: Hip                Time: 1131-1144 OT Time Calculation (min): 13 min Charges:  OT General Charges $OT Visit: 1 Visit OT Evaluation $OT Eval Moderate Complexity: 1 Mod  Ardeth Perfect., MPH, MS, OTR/L ascom [phone] 01/27/22, 1:22 PM

## 2022-01-27 NOTE — TOC Initial Note (Addendum)
Transition of Care [facility]) - Initial/Assessment Note    Patient Details  Name: Katie Bell MRN: [id number] Date of Birth: May 06, 1940  Transition of Care [facility]) CM/SW Contact:    Elliot Gurney [place], [place] Phone Number: 01/27/2022, 3:56 PM  Clinical Narrative:                 Met with patient at bedside to discuss therapy recommendation for SNF. Patient oriented x1, speech difficult to understand. Spoke with son Katie Bell who confirms that patient lives with him. [name] PCP is through [employer]. Patient uses [facility]. Per patient's son, patient has no additional services in the home. [name] son agreeable to SNF, does not have a preference. Fl2 completed and will need MD signature, PASRR pending- additional information requested. Fl2 faxed out and will review bed offers with patient's son once received.  Expected Discharge Plan: Skilled Nursing Facility Barriers to Discharge: Continued Medical Work up   Patient Goals and CMS Choice Patient states their goals for this hospitalization and ongoing recovery are:: to feel better      Expected Discharge Plan and Services Expected Discharge Plan: [place] In-house Referral: Clinical Social Work                                            Prior Living Arrangements/Services   Lives with:: Adult Children (son Katie Bell)   Do you feel safe going back to the place where you live?: Yes      Need for Family Participation in Patient Care: Yes (Comment) Care giver support system in place?: Yes (comment)   Criminal Activity/Legal Involvement Pertinent to Current Situation/Hospitalization: No - Comment as needed  Activities of Daily Living      Permission Sought/Granted      Share Information with NAME: Katie Bell        Permission granted to share info w Contact Information: [phone]  Emotional Assessment Appearance:: Appears stated age Attitude/Demeanor/Rapport: Unable to  Assess Affect (typically observed): Accepting Orientation: : Oriented to Self Alcohol / Substance Use: Not Applicable Psych Involvement: No (comment)  Admission diagnosis:  Closed right femoral fracture ([place]) [S72.91XA] Closed fracture of right hip, initial encounter ([place]) [S72.001A] Patient Active Problem List   Diagnosis Date Noted   Leucocytosis 01/24/2022   Fall at home, initial encounter 01/24/2022   Closed right femoral fracture ([place]) 01/24/2022   Tinnitus 08/10/2021   Malnutrition of mild degree ([place]) 10/26/2020   External hemorrhoid, bleeding 07/21/2020   Adnexal mass 07/21/2020   Left ovarian cyst 01/05/2020   Visit for preventive health examination 02/16/2014   Advanced directives, counseling/discussion 02/16/2014   Female cystocele 11/02/2013   Hypertension    Gastric ulcer    Episodic mood disorder ([place])    Osteoarthritis, multiple sites    PCP:  Venia Carbon, MD Pharmacy:   [facility], [place] [address] Phone: [phone] Fax: [phone]     Social Determinants of Health (SDOH) Interventions    Readmission Risk Interventions     No data to display

## 2022-01-27 NOTE — Progress Notes (Signed)
PROGRESS NOTE    Katie Bell  IHK:[id number] DOB: 21-Mar-1941 DOA: 01/24/2022 PCP: Venia Carbon, MD    Brief Narrative:  81 y.o. female with medical history significant of essential hypertension, anxiety disorder, previous history of basal cell carcinoma, GERD and kidney stones who presents to the ER after sustaining a mechanical fall at home.  Patient apparently slipped and fell down her stairs.  Sustained pain on the right hip with noticeable deformity.  Did not hit her head.  No evidence of other bodily injuries.  Patient brought to the ER where she was noted to have right intertrochanteric fracture.  Orthopedics consulted and plan to do operative repair 10/5.  Patient being admitted to the medical service.  She has no new complaints.  Pain is well controlled for the most part.  10/6: Status post IM nail for right hip fracture.  Seen by orthopedics.  Okay for DVT prophylaxis.  Patient a bit more confused this morning.  10/ 7: Mental status appears improved.  Patient more alert and communicative this morning   Assessment & Plan:   Principal Problem:   Closed right femoral fracture [facility]) Active Problems:   Hypertension   Leucocytosis   Fall at home, initial encounter  Right intertrochanteric hip fracture Secondary to mechanical fall.  No evidence of syncopal event.  Medically stable at this time.  Status post IM nail for right intertrochanteric hip fracture on 10/5.  Tolerated procedure well. Plan: Postoperative care Wound checks per Ortho Dressing changes by nursing staff Continue prophylaxis aspirin 81 mg twice daily Request therapy evaluations.  Patient was unable to work with therapy on 10/6 due to confusion Out of bed as tolerated  Altered mentation Unclear etiology.  Could be related to hospitalization versus recent surgery. Appears to be back to baseline ABG reassuring, no CO2 retention Plan: Delirium precautions Ensure pain control Minimize sedative and  narcotic use  Essential hypertension Continue home amlodipine  Hypokalemia Monitor and replace as necessary  Leukocytosis Suspect secondary to stress reaction.  No obvious source of infection.  Will monitor white count  GERD Twice daily PPI for now   DVT prophylaxis: On hold Code Status: Full Family Communication: Lauro Regulus [phone] on 10/5, 10/6 Disposition Plan: Status is: Inpatient Remains inpatient appropriate because: Hip fracture.  This post operative repair 10/5.  Will likely need skilled nursing facility.  Pending therapy evaluations and recommendations   Level of care: Med-Surg  Consultants:  Orthopedic surgery  Procedures:  Hip fracture IM nail 10/5  Antimicrobials: None   Subjective: Patient seen and examined.  Lying in bed.  No visible distress.  Mental status improved this morning.  Objective: Vitals:   01/26/22 2107 01/26/22 2119 01/26/22 2357 01/27/22 0846  BP: (!) 94/48 (!) 139/94 131/67 (!) 159/76  Pulse: 86  88 (!) 110  Resp: '20  16 18  '$ Temp: 97.9 F (36.6 C)  97.7 F (36.5 C) 98.2 F (36.8 C)  TempSrc: Axillary  Oral   SpO2:   99% 97%  Weight:      Height:        Intake/Output Summary (Last 24 hours) at 01/27/2022 1113 Last data filed at 01/27/2022 1059 Gross per 24 hour  Intake 30 ml  Output 0 ml  Net 30 ml   Filed Weights   01/24/22 1614  Weight: 49.9 kg    Examination:  General exam: Cute distress.  Lying in bed Respiratory system: Clear.  Normal work of breathing.  Room air Cardiovascular system: S1  and S2, RRR, no murmurs, no pedal edema Gastrointestinal system: Soft, NT/ND, normal bowel sounds Central nervous system: Alert and oriented. No focal neurological deficits. Extremities: Hip surgical dressing CDI Skin: No rashes, lesions or ulcers Psychiatry: Judgement and insight appear normal. Mood & affect confused.     Data Reviewed: I have personally reviewed following labs and imaging studies  CBC: Recent Labs   Lab 01/24/22 1623 01/25/22 0527 01/25/22 1320 01/26/22 0455 01/27/22 0442  WBC 11.8* 15.2*  --  18.8* 17.5*  NEUTROABS 10.0*  --   --   --   --   HGB 13.7 14.4 13.6 11.8* 11.8*  HCT 39.9 42.3 40.4 34.9* 34.6*  MCV 93.0 94.0  --  94.6 93.5  PLT 192 193  --  109* 109*   Basic Metabolic Panel: Recent Labs  Lab 01/24/22 1942 01/25/22 0527 01/26/22 0455 01/27/22 0442  NA 138 138 136 139  K 3.2* 3.8 3.0* 3.2*  CL 106 104 107 108  CO2 20* 23 24 21*  GLUCOSE 184* 200* 160* 152*  BUN '16 17 19 19  '$ CREATININE 0.88 0.93 1.05* 0.91  CALCIUM 9.3 9.5 8.5* 9.1   GFR: Estimated Creatinine Clearance: 34.2 mL/min (by C-G formula based on SCr of 0.91 mg/dL). Liver Function Tests: Recent Labs  Lab 01/24/22 1942 01/25/22 0527  AST 29 31  ALT 16 23  ALKPHOS 51 51  BILITOT 1.0 1.1  PROT 7.4 7.6  ALBUMIN 4.2 4.2   No results for input(s): "LIPASE", "AMYLASE" in the last 168 hours. No results for input(s): "AMMONIA" in the last 168 hours. Coagulation Profile: Recent Labs  Lab 01/25/22 0527  INR 1.0   Cardiac Enzymes: No results for input(s): "CKTOTAL", "CKMB", "CKMBINDEX", "TROPONINI" in the last 168 hours. BNP (last 3 results) No results for input(s): "PROBNP" in the last 8760 hours. HbA1C: No results for input(s): "HGBA1C" in the last 72 hours. CBG: Recent Labs  Lab 01/27/22 0957  GLUCAP 145*   Lipid Profile: No results for input(s): "CHOL", "HDL", "LDLCALC", "TRIG", "CHOLHDL", "LDLDIRECT" in the last 72 hours. Thyroid Function Tests: No results for input(s): "TSH", "T4TOTAL", "FREET4", "T3FREE", "THYROIDAB" in the last 72 hours. Anemia Panel: No results for input(s): "VITAMINB12", "FOLATE", "FERRITIN", "TIBC", "IRON", "RETICCTPCT" in the last 72 hours. Sepsis Labs: No results for input(s): "PROCALCITON", "LATICACIDVEN" in the last 168 hours.  No results found for this or any previous visit (from the past 240 hour(s)).       Radiology Studies: DG HIP UNILAT  WITH PELVIS 2-3 VIEWS RIGHT  Result Date: 01/25/2022 CLINICAL DATA:  Intramedullary nail of right hip. Intraoperative fluoroscopy. EXAM: DG HIP (WITH OR WITHOUT PELVIS) 2-3V RIGHT COMPARISON:  Right hip radiographs 01/24/2022 FINDINGS: Images were performed intraoperatively without the presence of a radiologist. Interval cephalomedullary nail fixation of the previously seen right intertrochanteric fracture. Improved now normal alignment. No evidence of hardware complication. Total fluoroscopy images: 2 Total fluoroscopy time: 35 seconds Total dose: Radiation Exposure Index (as provided by the fluoroscopic device): 3.189 mGy air Kerma Please see intraoperative findings for further detail. IMPRESSION: Intraoperative fluoroscopy for right intertrochanteric fracture fixation. Electronically Signed   By: Yvonne Kendall M.D.   On: 01/25/2022 18:34   DG C-Arm 1-60 Min-No Report  Result Date: 01/25/2022 Fluoroscopy was utilized by the requesting physician.  No radiographic interpretation.        Scheduled Meds:  amLODipine  5 mg Oral Daily   aspirin EC  81 mg Oral BID   docusate sodium  100 mg Oral BID   pantoprazole  40 mg Oral Daily   Continuous Infusions:  tranexamic acid       LOS: 3 days     Sidney Ace, MD [facility]   If 7PM-7AM, please contact night-coverage  01/27/2022, 11:13 AM

## 2022-01-27 NOTE — Evaluation (Signed)
Physical Therapy Evaluation Patient Details Name: Katie Bell MRN: [id number] DOB: 20-Sep-1940 Today's Date: 01/27/2022  History of Present Illness  Katie Bell is an 56yoF who comes to [facility] on 10/4 after a fall, scans revealing of right intertrochanteric femur fracture. PMH: HTN, GAD, basal cell carcinoma, GERD, renal lithiasis. Pt taken to theatre under direction of Katie Bell for IM nail fixation, then made WBAT. At baseline pt lives with son, most household AMB without device, still drives occasionally, no recent balance or falls issues.  Clinical Impression  Eval completed on 2nd visit attempt today. Pt remains somnolent, calm, confused. Pt has a visitor who attests to patient's altered state. Background collected from Son over telephone. Pt needs cues to open eyes in session, they remain closed most of time, but she is verbally interactive although speech is very slurred and difficulty to understand. Pt follows cues for assistance with repositioning in bed, but most movements or RLE either for ROM education or functional mobility in bed are no tolerates well, pt's cognition limited insight into reason why PT has value. Pt pulled up in bed and elevated to sitting position. Pt has again removed her distal honeycomb dressing and one SCD. Pt interactive with guess evertually, somewhat more alert at end of session, but still very confused. Tried to entice the patient to partake in her still-hot lunch tray, but she expresses no interest in eating. Will continue to follow and progress therapy activities as deemed safe and tolerable. Pt demonstrating impairment of strength, balance, ROM, and activity tolerance, limiting indep in ADL and functional mobility within the home and community. Pt will benefit from skilled PT intervention to address the above deficits in order to restore patient to PLOF and improve safety for return to home.        Recommendations for follow up therapy are one component  of a multi-disciplinary discharge planning process, led by the attending physician.  Recommendations may be updated based on patient status, additional functional criteria and insurance authorization.  Follow Up Recommendations Skilled nursing-short term rehab (<3 hours/day) Can patient physically be transported by private vehicle: No    Assistance Recommended at Discharge Frequent or constant Supervision/Assistance  Patient can return home with the following  Two people to help with walking and/or transfers;Two people to help with bathing/dressing/bathroom;Direct supervision/assist for medications management;Direct supervision/assist for financial management;Assist for transportation;Assistance with cooking/housework;Assistance with feeding;Help with stairs or ramp for entrance    Equipment Recommendations None recommended by PT  Recommendations for Other Services       Functional Status Assessment Patient has had a recent decline in their functional status and demonstrates the ability to make significant improvements in function in a reasonable and predictable amount of time.     Precautions / Restrictions Precautions Precautions: Fall Restrictions Weight Bearing Restrictions: Yes RLE Weight Bearing: Weight bearing as tolerated      Mobility  Bed Mobility Overal bed mobility: Needs Assistance Bed Mobility:  (supine to hooklying, scooting up toward HOB, Rolling Left- all require totalA, pt very weak and sleepy)                Transfers Overall transfer level:  (no safe to attempt at this time)                      Ambulation/Gait                  Stairs  Wheelchair Mobility    Modified Rankin (Stroke Patients Only)       Balance                                             Pertinent Vitals/Pain Pain Assessment Pain Assessment: Faces Faces Pain Scale: Hurts even more (just with mobility and repositioning)     Home Living Family/patient expects to be discharged to:: Private residence Living Arrangements: Children (Son) Available Help at Discharge: Family;Available PRN/intermittently Type of Home: House Home Access: Stairs to enter Entrance Stairs-Rails: Psychiatric nurse of Steps: 5   Home Layout: One level Home Equipment: None Additional Comments: no family present, pt unable to provide PLOF/home setup    Prior Function Prior Level of Function : Patient poor historian/Family not available             Mobility Comments: Per son, mostly homebound, AMB freely without device, went out to store ~1x weekly, did housework/laundry ad lib, still drives short distances, no history of recent falls/balance. ADLs Comments: Independent at baseline     Hand Dominance        Extremity/Trunk Assessment   Upper Extremity Assessment Upper Extremity Assessment: Generalized weakness;Difficult to assess due to impaired cognition    Lower Extremity Assessment Lower Extremity Assessment: Difficult to assess due to impaired cognition;Generalized weakness;RLE deficits/detail RLE Deficits / Details: pain limited RLE: Unable to fully assess due to pain       Communication      Cognition Arousal/Alertness: Lethargic Behavior During Therapy: Restless, Impulsive, Flat affect Overall Cognitive Status: Impaired/Different from baseline                                 General Comments: Pt keeps eyes closed most of session, does follow ~50% of simple 1-step commands, oriented to self only        General Comments      Exercises General Exercises - Lower Extremity Ankle Circles/Pumps: PROM, Both, 5 reps, Supine, Limitations Ankle Circles/Pumps Limitations: unable to follow cues Heel Slides: PROM, Right, 5 reps, Supine, Limitations Heel Slides Limitations: unable to produce much effort, generally poor tolerance due to pain   Assessment/Plan    PT Assessment  Patient needs continued PT services  PT Problem List Decreased range of motion;Decreased strength;Decreased activity tolerance;Decreased balance;Decreased mobility;Decreased safety awareness;Decreased knowledge of use of DME;Decreased cognition       PT Treatment Interventions DME instruction;Balance training;Gait training;Stair training;Cognitive remediation;Functional mobility training;Patient/family education;Therapeutic activities;Therapeutic exercise    PT Goals (Current goals can be found in the Care Plan section)  Acute Rehab PT Goals Patient Stated Goal: have pt regain independent AMB PT Goal Formulation: With family Time For Goal Achievement: 02/10/22 Potential to Achieve Goals: Fair    Frequency Min 2X/week     Co-evaluation               AM-PAC PT "6 Clicks" Mobility  Outcome Measure Help needed turning from your back to your side while in a flat bed without using bedrails?: Total Help needed moving from lying on your back to sitting on the side of a flat bed without using bedrails?: Total Help needed moving to and from a bed to a chair (including a wheelchair)?: Total Help needed standing up from a chair using your arms (e.g., wheelchair or bedside  chair)?: Total Help needed to walk in hospital room?: Total Help needed climbing 3-5 steps with a railing? : Total 6 Click Score: 6    End of Session Equipment Utilized During Treatment: Oxygen Activity Tolerance: Patient limited by lethargy;Patient limited by pain;Patient tolerated treatment well Patient left: in bed;with family/visitor present;with bed alarm set Nurse Communication: Mobility status PT Visit Diagnosis: Muscle weakness (generalized) (M62.81);History of falling (Z91.81)    Time: 4403-4742 PT Time Calculation (min) (ACUTE ONLY): 12 min   Charges:   PT Evaluation $PT Eval Moderate Complexity: 1 Mod         2:59 PM, 01/27/22 Etta Grandchild, PT, DPT Physical Therapist - [facility]  [phone] ([place])    [place] C 01/27/2022, 2:54 PM

## 2022-01-27 NOTE — Progress Notes (Signed)
PT Cancellation Note  Patient Details Name: Katie Bell MRN: [id number] DOB: 1940-12-19   Cancelled Treatment:    Reason Eval/Treat Not Completed: Fatigue/lethargy limiting ability to participate (Chart reviewed, eval attempted. Pt remains lethargic, unable to participate in eval. Pt reponds more coherently today than previously, but maintains eyes closed, appears asleep. OT reports their eval earlier in day did not stimulate much alertness.) Will attempt again later in day.   12:17 PM, 01/27/22 Etta Grandchild, PT, DPT Physical Therapist - [facility]  [phone] ([place])    [name] 01/27/2022, 12:16 PM

## 2022-01-28 DIAGNOSIS — S72141A Displaced intertrochanteric fracture of right femur, initial encounter for closed fracture: Secondary | ICD-10-CM | POA: Diagnosis not present

## 2022-01-28 LAB — CBC WITH DIFFERENTIAL/PLATELET
Abs Immature Granulocytes: 0.09 10*3/uL — ABNORMAL HIGH (ref 0.00–0.07)
Basophils Absolute: 0 10*3/uL (ref 0.0–0.1)
Basophils Relative: 0 %
Eosinophils Absolute: 0 10*3/uL (ref 0.0–0.5)
Eosinophils Relative: 0 %
HCT: 30.6 % — ABNORMAL LOW (ref 36.0–46.0)
Hemoglobin: 10.5 g/dL — ABNORMAL LOW (ref 12.0–15.0)
Immature Granulocytes: 1 %
Lymphocytes Relative: 5 %
Lymphs Abs: 0.8 10*3/uL (ref 0.7–4.0)
MCH: 31.8 pg (ref 26.0–34.0)
MCHC: 34.3 g/dL (ref 30.0–36.0)
MCV: 92.7 fL (ref 80.0–100.0)
Monocytes Absolute: 1.1 10*3/uL — ABNORMAL HIGH (ref 0.1–1.0)
Monocytes Relative: 7 %
Neutro Abs: 13.8 10*3/uL — ABNORMAL HIGH (ref 1.7–7.7)
Neutrophils Relative %: 87 %
Platelets: 136 10*3/uL — ABNORMAL LOW (ref 150–400)
RBC: 3.3 MIL/uL — ABNORMAL LOW (ref 3.87–5.11)
RDW: 11.9 % (ref 11.5–15.5)
WBC: 15.8 10*3/uL — ABNORMAL HIGH (ref 4.0–10.5)
nRBC: 0 % (ref 0.0–0.2)

## 2022-01-28 LAB — BASIC METABOLIC PANEL
Anion gap: 10 (ref 5–15)
BUN: 40 mg/dL — ABNORMAL HIGH (ref 8–23)
CO2: 20 mmol/L — ABNORMAL LOW (ref 22–32)
Calcium: 9.2 mg/dL (ref 8.9–10.3)
Chloride: 108 mmol/L (ref 98–111)
Creatinine, Ser: 1.24 mg/dL — ABNORMAL HIGH (ref 0.44–1.00)
GFR, Estimated: 43 mL/min — ABNORMAL LOW (ref >60)
Glucose, Bld: 130 mg/dL — ABNORMAL HIGH (ref 70–99)
Potassium: 2.9 mmol/L — ABNORMAL LOW (ref 3.5–5.1)
Sodium: 138 mmol/L (ref 135–145)

## 2022-01-28 LAB — MAGNESIUM: Magnesium: 2.5 mg/dL — ABNORMAL HIGH (ref 1.7–2.4)

## 2022-01-28 MED ORDER — KCL IN DEXTROSE-NACL 40-5-0.45 MEQ/L-%-% IV SOLN
INTRAVENOUS | Status: DC
  Filled 2022-01-28 (×11): qty 1000

## 2022-01-28 MED ORDER — POTASSIUM CHLORIDE CRYS ER 20 MEQ PO TBCR
40.0000 meq | EXTENDED_RELEASE_TABLET | Freq: Once | ORAL | Status: AC
  Administered 2022-01-28: 40 meq via ORAL
  Filled 2022-01-28: qty 2

## 2022-01-28 MED ORDER — ADULT MULTIVITAMIN W/MINERALS CH
1.0000 | ORAL_TABLET | Freq: Every day | ORAL | Status: DC
  Administered 2022-01-28 – 2022-02-09 (×10): 1 via ORAL
  Filled 2022-01-28 (×10): qty 1

## 2022-01-28 MED ORDER — ENSURE ENLIVE PO LIQD
237.0000 mL | Freq: Two times a day (BID) | ORAL | Status: DC
  Administered 2022-01-28 – 2022-02-01 (×3): 237 mL via ORAL

## 2022-01-28 NOTE — Plan of Care (Signed)
  Problem: Education: Goal: Knowledge of General Education information will improve Description: Including pain rating scale, medication(s)/side effects and non-pharmacologic comfort measures Outcome: Not Progressing   Problem: Health Behavior/Discharge Planning: Goal: Ability to manage health-related needs will improve Outcome: Not Progressing   Problem: Clinical Measurements: Goal: Ability to maintain clinical measurements within normal limits will improve Outcome: Not Progressing Goal: Will remain free from infection Outcome: Not Progressing Goal: Diagnostic test results will improve Outcome: Not Progressing Goal: Respiratory complications will improve Outcome: Not Progressing Goal: Cardiovascular complication will be avoided Outcome: Not Progressing   Problem: Activity: Goal: Risk for activity intolerance will decrease Outcome: Not Progressing   Problem: Nutrition: Goal: Adequate nutrition will be maintained Outcome: Not Progressing   Problem: Coping: Goal: Level of anxiety will decrease Outcome: Not Progressing   Problem: Elimination: Goal: Will not experience complications related to bowel motility Outcome: Not Progressing Goal: Will not experience complications related to urinary retention Outcome: Not Progressing   Problem: Pain Managment: Goal: General experience of comfort will improve Outcome: Not Progressing   Problem: Safety: Goal: Ability to remain free from injury will improve Outcome: Not Progressing   Problem: Skin Integrity: Goal: Risk for impaired skin integrity will decrease Outcome: Not Progressing   Problem: Education: Goal: Verbalization of understanding the information provided (i.e., activity precautions, restrictions, etc) will improve Outcome: Not Progressing Goal: Individualized Educational Video(s) Outcome: Not Progressing   Problem: Activity: Goal: Ability to ambulate and perform ADLs will improve Outcome: Not Progressing    Problem: Clinical Measurements: Goal: Postoperative complications will be avoided or minimized Outcome: Not Progressing   Problem: Self-Concept: Goal: Ability to maintain and perform role responsibilities to the fullest extent possible will improve Outcome: Not Progressing   Problem: Pain Management: Goal: Pain level will decrease Outcome: Not Progressing   

## 2022-01-28 NOTE — TOC Progression Note (Signed)
Transition of Care [facility]) - Progression Note    Patient Details  Name: Katie Bell MRN: [id number] Date of Birth: [birth year]  Transition of Care [facility]) CM/SW [place], LCSW Phone Number: 01/28/2022, 11:39 AM  Clinical Narrative:    Additional info uploaded into [place] Must for PASRR. PASRR PENDING. TOC to continue to follow.    Expected Discharge Plan: [place] Barriers to Discharge: Continued Medical Work up  Expected Discharge Plan and Services Expected Discharge Plan: [place] In-house Referral: Clinical Social Work                                             Social Determinants of Health (SDOH) Interventions    Readmission Risk Interventions     No data to display

## 2022-01-28 NOTE — Progress Notes (Signed)
PROGRESS NOTE    Katie Bell  TDD:[id number] DOB: 1940/10/26 DOA: 01/24/2022 PCP: Venia Carbon, MD    Brief Narrative:  81 y.o. female with medical history significant of essential hypertension, anxiety disorder, previous history of basal cell carcinoma, GERD and kidney stones who presents to the ER after sustaining a mechanical fall at home.  Patient apparently slipped and fell down her stairs.  Sustained pain on the right hip with noticeable deformity.  Did not hit her head.  No evidence of other bodily injuries.  Patient brought to the ER where she was noted to have right intertrochanteric fracture.  Orthopedics consulted and plan to do operative repair 10/5.  Patient being admitted to the medical service.  She has no new complaints.  Pain is well controlled for the most part.  10/6: Status post IM nail for right hip fracture.  Seen by orthopedics.  Okay for DVT prophylaxis.  Patient a bit more confused this morning.  10/ 7: Mental status appears improved.  Patient more alert and communicative this morning  10/8: Sitting up in bed.  Attempting to eat.  Very poor p.o. intake.   Assessment & Plan:   Principal Problem:   Closed right femoral fracture [facility]) Active Problems:   Hypertension   Leucocytosis   Fall at home, initial encounter  Right intertrochanteric hip fracture Secondary to mechanical fall.  No evidence of syncopal event.  Medically stable at this time.  Status post IM nail for right intertrochanteric hip fracture on 10/5.  Tolerated procedure well. Postoperative pain mild Postoperative oral intake has been poor Plan: Postoperative care Wound checks per Ortho Dressing changes by nursing staff Continue prophylaxis aspirin 81 mg twice daily Therapy evaluations as tolerated Out of bed as tolerated  Altered mentation Unclear etiology.  Could be related to hospitalization versus recent surgery. Appears to be back to baseline ABG reassuring, no CO2  retention Plan: Delirium precautions Ensure pain control Minimize sedative and narcotic use RD consultation  Suspected protein calorie malnutrition We will request RD consultation At twice daily shakes  Essential hypertension Continue home amlodipine  Hypokalemia Monitor and replace as necessary  Leukocytosis Suspect secondary to stress reaction.  No obvious source of infection.  Will monitor white count  GERD Twice daily PPI for now   DVT prophylaxis: On hold Code Status: Full Family Communication: Lauro Regulus [phone] on 10/5, 10/6, 10/8 Disposition Plan: Status is: Inpatient Remains inpatient appropriate because: Hip fracture status postoperative repair 10/5.  Will need skilled nursing facility.  Poor p.o. intake.   Level of care: Med-Surg  Consultants:  Orthopedic surgery  Procedures:  Hip fracture IM nail 10/5  Antimicrobials: None   Subjective: Patient sitting in bed.  Attempting to eat with assistance of nursing staff.  Poor p.o. intake.  Objective: Vitals:   01/27/22 1618 01/27/22 2250 01/28/22 0544 01/28/22 0835  BP: (!) 151/120 (!) 157/77 (!) 148/64 (!) 159/86  Pulse: (!) 101 (!) 108 (!) 101 (!) 105  Resp: '16 18 20 16  '$ Temp: (!) 97.3 F (36.3 C) 98 F (36.7 C) 98.2 F (36.8 C) 98.6 F (37 C)  TempSrc:  Oral Oral   SpO2: 99% 97% 98% 94%  Weight:      Height:        Intake/Output Summary (Last 24 hours) at 01/28/2022 1006 Last data filed at 01/27/2022 2215 Gross per 24 hour  Intake 120 ml  Output 2 ml  Net 118 ml   Filed Weights   01/24/22 1614  Weight: 49.9 kg    Examination:  General exam: No acute distress.  Appears fatigued Respiratory system: Lungs clear.  Normal work of breathing.  Room air Cardiovascular system: S1 and S2, RRR, no murmurs, no pedal edema Gastrointestinal system: Soft, NT/ND, normal bowel sounds Central nervous system: Alert and oriented. No focal neurological deficits. Extremities: Hip surgical dressing  CDI Skin: No rashes, lesions or ulcers Psychiatry: Judgement and insight appear normal. Mood & affect confused.     Data Reviewed: I have personally reviewed following labs and imaging studies  CBC: Recent Labs  Lab 01/24/22 1623 01/25/22 0527 01/25/22 1320 01/26/22 0455 01/27/22 0442 01/27/22 1034 01/28/22 0740  WBC 11.8* 15.2*  --  18.8* 17.5* 16.3* 15.8*  NEUTROABS 10.0*  --   --   --   --  14.8* 13.8*  HGB 13.7 14.4 13.6 11.8* 11.8* 11.6* 10.5*  HCT 39.9 42.3 40.4 34.9* 34.6* 33.9* 30.6*  MCV 93.0 94.0  --  94.6 93.5 92.9 92.7  PLT 192 193  --  109* 128* 126* 478*   Basic Metabolic Panel: Recent Labs  Lab 01/25/22 0527 01/26/22 0455 01/27/22 0442 01/27/22 1034 01/28/22 0740  NA 138 136 139 140 138  K 3.8 3.0* 3.2* 3.0* 2.9*  CL 104 107 108 109 108  CO2 23 24 21* 23 20*  GLUCOSE 200* 160* 152* 146* 130*  BUN '17 19 19 23 '$ 40*  CREATININE 0.93 1.05* 0.91 0.98 1.24*  CALCIUM 9.5 8.5* 9.1 9.0 9.2  MG  --   --   --  2.3 2.5*   GFR: Estimated Creatinine Clearance: 25.1 mL/min (A) (by C-G formula based on SCr of 1.24 mg/dL (H)). Liver Function Tests: Recent Labs  Lab 01/24/22 1942 01/25/22 0527  AST 29 31  ALT 16 23  ALKPHOS 51 51  BILITOT 1.0 1.1  PROT 7.4 7.6  ALBUMIN 4.2 4.2   No results for input(s): "LIPASE", "AMYLASE" in the last 168 hours. No results for input(s): "AMMONIA" in the last 168 hours. Coagulation Profile: Recent Labs  Lab 01/25/22 0527  INR 1.0   Cardiac Enzymes: No results for input(s): "CKTOTAL", "CKMB", "CKMBINDEX", "TROPONINI" in the last 168 hours. BNP (last 3 results) No results for input(s): "PROBNP" in the last 8760 hours. HbA1C: No results for input(s): "HGBA1C" in the last 72 hours. CBG: Recent Labs  Lab 01/27/22 0957  GLUCAP 145*   Lipid Profile: No results for input(s): "CHOL", "HDL", "LDLCALC", "TRIG", "CHOLHDL", "LDLDIRECT" in the last 72 hours. Thyroid Function Tests: No results for input(s): "TSH",  "T4TOTAL", "FREET4", "T3FREE", "THYROIDAB" in the last 72 hours. Anemia Panel: No results for input(s): "VITAMINB12", "FOLATE", "FERRITIN", "TIBC", "IRON", "RETICCTPCT" in the last 72 hours. Sepsis Labs: No results for input(s): "PROCALCITON", "LATICACIDVEN" in the last 168 hours.  No results found for this or any previous visit (from the past 240 hour(s)).       Radiology Studies: No results found.      Scheduled Meds:  amLODipine  5 mg Oral Daily   aspirin EC  81 mg Oral BID   docusate sodium  100 mg Oral BID   feeding supplement  237 mL Oral BID BM   pantoprazole  40 mg Oral Daily   Continuous Infusions:  dextrose 5 % and 0.45 % NaCl with KCl 40 mEq/L     tranexamic acid       LOS: 4 days     Sidney Ace, MD [facility]   If 7PM-7AM, please contact night-coverage  01/28/2022, 10:06 AM

## 2022-01-28 NOTE — Progress Notes (Signed)
RE: Katie Bell Date of Birth:  04/06/1941 Date: 01/28/22   To Whom It May Concern:  Please be advised that the above-named patient will require a short-term nursing home stay - anticipated 30 days or less for rehabilitation and strengthening.  The plan is for return home.

## 2022-01-28 NOTE — Plan of Care (Signed)
  Problem: Health Behavior/Discharge Planning: Goal: Ability to manage health-related needs will improve Outcome: Progressing   Problem: Clinical Measurements: Goal: Ability to maintain clinical measurements within normal limits will improve Outcome: Progressing   Problem: Clinical Measurements: Goal: Diagnostic test results will improve Outcome: Progressing   Problem: Nutrition: Goal: Adequate nutrition will be maintained Outcome: Progressing   Problem: Safety: Goal: Ability to remain free from injury will improve Outcome: Progressing

## 2022-01-28 NOTE — Progress Notes (Signed)
Initial Nutrition Assessment  DOCUMENTATION CODES:   Not applicable  INTERVENTION:   Liberalize pt diet to regular due to no clinical indication for carb modified diet. Encourage good PO intake Feeding assistance with all meals Multivitamin w/ minerals daily Ensure Enlive po BID, each supplement provides 350 kcal and 20 grams of protein. Magic cup BID with meals, each supplement provides 290 kcal and 9 grams of protein  NUTRITION DIAGNOSIS:   Inadequate oral intake related to poor appetite as evidenced by meal completion < 25%.  GOAL:   Patient will meet greater than or equal to 90% of their needs  MONITOR:   PO intake, Supplement acceptance, Labs, Weight trends  REASON FOR ASSESSMENT:   Consult Assessment of nutrition requirement/status  ASSESSMENT:   81 y.o. female presented to the ED after a fall at home. PMH includes malnutrition, GERD, and HTN. Pt admitted with R femoral fracture.   10/04 - Admitted  10/05 - s/p R intramedullary nail intertrochanteric  RD working remotely at time of assessment. Meal Documentation (10/6-10/7): 0% x 4 meals  Discussed with RN, RN reports that pt had a banana for breakfast and some crackers for lunch when her son was there.   Per EMR, pt with no weight loss within the past year.  RD to order additional nutritional supplements to assist in PO intake.   Medications reviewed and include: Colace, Protonix Labs reviewed: Potassium 2.9, BUN 40, Creatinine 1.24, Magnesium 2.5    NUTRITION - FOCUSED PHYSICAL EXAM:  Deferred to follow-up.   Diet Order:   Diet Order             Diet regular Room service appropriate? Yes with Assist; Fluid consistency: Thin  Diet effective now                   EDUCATION NEEDS:   No education needs have been identified at this time  Skin:  Skin Assessment: Reviewed RN Assessment  Last BM:  Unknown  Height:   Ht Readings from Last 1 Encounters:  01/24/22 5' (1.524 m)    Weight:    Wt Readings from Last 1 Encounters:  01/24/22 49.9 kg    Ideal Body Weight:  45.5 kg  BMI:  Body mass index is 21.48 kg/m.  Estimated Nutritional Needs:   Kcal:  1500-1700  Protein:  75-90 grams  Fluid:  >/= 1.5 L    Hermina Barters RD, LDN Clinical Dietitian See [facility] for contact information.

## 2022-01-29 DIAGNOSIS — S72141A Displaced intertrochanteric fracture of right femur, initial encounter for closed fracture: Secondary | ICD-10-CM | POA: Diagnosis not present

## 2022-01-29 LAB — BASIC METABOLIC PANEL
Anion gap: 7 (ref 5–15)
BUN: 45 mg/dL — ABNORMAL HIGH (ref 8–23)
CO2: 21 mmol/L — ABNORMAL LOW (ref 22–32)
Calcium: 9.1 mg/dL (ref 8.9–10.3)
Chloride: 113 mmol/L — ABNORMAL HIGH (ref 98–111)
Creatinine, Ser: 1.6 mg/dL — ABNORMAL HIGH (ref 0.44–1.00)
GFR, Estimated: 32 mL/min — ABNORMAL LOW (ref >60)
Glucose, Bld: 168 mg/dL — ABNORMAL HIGH (ref 70–99)
Potassium: 3.6 mmol/L (ref 3.5–5.1)
Sodium: 141 mmol/L (ref 135–145)

## 2022-01-29 LAB — CBC WITH DIFFERENTIAL/PLATELET
Abs Immature Granulocytes: 0.11 10*3/uL — ABNORMAL HIGH (ref 0.00–0.07)
Basophils Absolute: 0 10*3/uL (ref 0.0–0.1)
Basophils Relative: 0 %
Eosinophils Absolute: 0 10*3/uL (ref 0.0–0.5)
Eosinophils Relative: 0 %
HCT: 28.8 % — ABNORMAL LOW (ref 36.0–46.0)
Hemoglobin: 9.9 g/dL — ABNORMAL LOW (ref 12.0–15.0)
Immature Granulocytes: 1 %
Lymphocytes Relative: 6 %
Lymphs Abs: 1 10*3/uL (ref 0.7–4.0)
MCH: 31.6 pg (ref 26.0–34.0)
MCHC: 34.4 g/dL (ref 30.0–36.0)
MCV: 92 fL (ref 80.0–100.0)
Monocytes Absolute: 1.6 10*3/uL — ABNORMAL HIGH (ref 0.1–1.0)
Monocytes Relative: 10 %
Neutro Abs: 13.8 10*3/uL — ABNORMAL HIGH (ref 1.7–7.7)
Neutrophils Relative %: 83 %
Platelets: 145 10*3/uL — ABNORMAL LOW (ref 150–400)
RBC: 3.13 MIL/uL — ABNORMAL LOW (ref 3.87–5.11)
RDW: 12.3 % (ref 11.5–15.5)
WBC: 16.5 10*3/uL — ABNORMAL HIGH (ref 4.0–10.5)
nRBC: 0 % (ref 0.0–0.2)

## 2022-01-29 NOTE — Progress Notes (Signed)
  Subjective:  Patient reports pain as well controlled.  She is sitting up in bedside chair.  Objective:   VITALS:   Vitals:   01/28/22 1742 01/28/22 2251 01/29/22 0537 01/29/22 0745  BP: 137/75 (!) 141/81 118/74 138/70  Pulse: 99 97  (!) 102  Resp: 16 (!) 22 20   Temp: 97.7 F (36.5 C) 98.8 F (37.1 C) (!) 97.5 F (36.4 C) (!) 97.4 F (36.3 C)  TempSrc:  Oral Oral   SpO2: 96% 94% 96% 95%  Weight:      Height:        PHYSICAL EXAM:  General: Pleasantly confused Right lower extremity, dressings are clean dry and intact.  Grossly motor and sensory intact  LABS  Results for orders placed or performed during the hospital encounter of 01/24/22 (from the past 24 hour(s))  Basic metabolic panel     Status: Abnormal   Collection Time: 01/29/22  9:04 AM  Result Value Ref Range   Sodium 141 135 - 145 mmol/L   Potassium 3.6 3.5 - 5.1 mmol/L   Chloride 113 (H) 98 - 111 mmol/L   CO2 21 (L) 22 - 32 mmol/L   Glucose, Bld 168 (H) 70 - 99 mg/dL   BUN 45 (H) 8 - 23 mg/dL   Creatinine, Ser 1.60 (H) 0.44 - 1.00 mg/dL   Calcium 9.1 8.9 - 10.3 mg/dL   GFR, Estimated 32 (L) >60 mL/min   Anion gap 7 5 - 15  CBC with Differential/Platelet     Status: Abnormal   Collection Time: 01/29/22  9:36 AM  Result Value Ref Range   WBC 16.5 (H) 4.0 - 10.5 K/uL   RBC 3.13 (L) 3.87 - 5.11 MIL/uL   Hemoglobin 9.9 (L) 12.0 - 15.0 g/dL   HCT 28.8 (L) 36.0 - 46.0 %   MCV 92.0 80.0 - 100.0 fL   MCH 31.6 26.0 - 34.0 pg   MCHC 34.4 30.0 - 36.0 g/dL   RDW 12.3 11.5 - 15.5 %   Platelets 145 (L) 150 - 400 K/uL   nRBC 0.0 0.0 - 0.2 %   Neutrophils Relative % 83 %   Neutro Abs 13.8 (H) 1.7 - 7.7 K/uL   Lymphocytes Relative 6 %   Lymphs Abs 1.0 0.7 - 4.0 K/uL   Monocytes Relative 10 %   Monocytes Absolute 1.6 (H) 0.1 - 1.0 K/uL   Eosinophils Relative 0 %   Eosinophils Absolute 0.0 0.0 - 0.5 K/uL   Basophils Relative 0 %   Basophils Absolute 0.0 0.0 - 0.1 K/uL   Immature Granulocytes 1 %   Abs  Immature Granulocytes 0.11 (H) 0.00 - 0.07 K/uL    No results found.  Assessment/Plan: 4 Days Post-Op   Status post right hip IM nail  Principal Problem:   Closed right femoral fracture [facility]) Active Problems:   Hypertension   Leucocytosis   Fall at home, initial encounter   PT/OT: Weight-bear as tolerated DVT prophylaxis: ASA 81 mg twice daily Follow-up in clinic in 2 weeks for staple removal and x-rays   Renee Harder , MD 01/29/2022, 12:18 PM

## 2022-01-29 NOTE — Progress Notes (Signed)
PROGRESS NOTE    Katie Bell  YNW:[id number] DOB: 30-Jul-1940 DOA: 01/24/2022 PCP: Venia Carbon, MD    Brief Narrative:  81 y.o. female with medical history significant of essential hypertension, anxiety disorder, previous history of basal cell carcinoma, GERD and kidney stones who presents to the ER after sustaining a mechanical fall at home.  Patient apparently slipped and fell down her stairs.  Sustained pain on the right hip with noticeable deformity.  Did not hit her head.  No evidence of other bodily injuries.  Patient brought to the ER where she was noted to have right intertrochanteric fracture.  Orthopedics consulted and plan to do operative repair 10/5.  Patient being admitted to the medical service.  She has no new complaints.  Pain is well controlled for the most part.  10/6: Status post IM nail for right hip fracture.  Seen by orthopedics.  Okay for DVT prophylaxis.  Patient a bit more confused this morning.  10/ 7: Mental status appears improved.  Patient more alert and communicative this morning  10/8: Sitting up in bed.  Attempting to eat.  Very poor p.o. intake.   Assessment & Plan:   Principal Problem:   Closed right femoral fracture [facility]) Active Problems:   Hypertension   Leucocytosis   Fall at home, initial encounter  Right intertrochanteric hip fracture Secondary to mechanical fall.  No evidence of syncopal event.  Medically stable at this time.  Status post IM nail for right intertrochanteric hip fracture on 10/5.  Tolerated procedure well. Postoperative pain mild Postoperative oral intake has been poor Plan: Postoperative care dressing changes per nursing staff Continue prophylaxis aspirin 81 mg twice daily Therapy evaluations as tolerated Out of bed as tolerated  Acute kidney injury Increase rate of fluids to 100 cc/h Avoid nephrotoxins Encourage p.o. fluid intake  Altered mentation Unclear etiology.  Could be related to hospitalization  versus recent surgery. Appears to be back to baseline ABG reassuring, no CO2 retention Plan: Delirium precautions Ensure pain control Minimize sedative and narcotic use RD consultation  Suspected protein calorie malnutrition Nutritional supplementation Assistance with meals  Essential hypertension Continue home amlodipine  Hypokalemia Monitor and replace as necessary  Leukocytosis Suspect secondary to stress reaction.  No obvious source of infection.  Will monitor white count  GERD Twice daily PPI for now   DVT prophylaxis: On hold Code Status: Full Family Communication: Lauro Regulus [phone] on 10/5, 10/6, 10/8 Disposition Plan: Status is: Inpatient Remains inpatient appropriate because: Hip fracture status postoperative repair 10/5.  Will need skilled nursing facility.  Poor p.o. intake.   Level of care: Med-Surg  Consultants:  Orthopedic surgery  Procedures:  Hip fracture IM nail 10/5  Antimicrobials: None   Subjective: Patient lying in bed.  Endorses hunger.  Objective: Vitals:   01/28/22 1742 01/28/22 2251 01/29/22 0537 01/29/22 0745  BP: 137/75 (!) 141/81 118/74 138/70  Pulse: 99 97  (!) 102  Resp: 16 (!) 22 20   Temp: 97.7 F (36.5 C) 98.8 F (37.1 C) (!) 97.5 F (36.4 C) (!) 97.4 F (36.3 C)  TempSrc:  Oral Oral   SpO2: 96% 94% 96% 95%  Weight:      Height:        Intake/Output Summary (Last 24 hours) at 01/29/2022 1125 Last data filed at 01/28/2022 2245 Gross per 24 hour  Intake 304.1 ml  Output 200 ml  Net 104.1 ml   Filed Weights   01/24/22 1614  Weight: 49.9 kg  Examination:  General exam: No acute distress.  Appears fatigued.  Appears frail Respiratory system: Lungs clear.  Normal work of breathing.  Room air Cardiovascular system: S1 and S2, RRR, no murmurs, no pedal edema Gastrointestinal system: Soft, NT/ND, normal bowel sounds Central nervous system: Alert and oriented. No focal neurological deficits. Extremities:  Hip surgical dressing CDI.  Decreased ROM Skin: No rashes, lesions or ulcers Psychiatry: Judgement and insight appear normal. Mood & affect confused.     Data Reviewed: I have personally reviewed following labs and imaging studies  CBC: Recent Labs  Lab 01/24/22 1623 01/25/22 0527 01/26/22 0455 01/27/22 0442 01/27/22 1034 01/28/22 0740 01/29/22 0936  WBC 11.8*   < > 18.8* 17.5* 16.3* 15.8* 16.5*  NEUTROABS 10.0*  --   --   --  14.8* 13.8* 13.8*  HGB 13.7   < > 11.8* 11.8* 11.6* 10.5* 9.9*  HCT 39.9   < > 34.9* 34.6* 33.9* 30.6* 28.8*  MCV 93.0   < > 94.6 93.5 92.9 92.7 92.0  PLT 192   < > 109* 128* 126* 136* 145*   < > = values in this interval not displayed.   Basic Metabolic Panel: Recent Labs  Lab 01/26/22 0455 01/27/22 0442 01/27/22 1034 01/28/22 0740 01/29/22 0904  NA 136 139 140 138 141  K 3.0* 3.2* 3.0* 2.9* 3.6  CL 107 108 109 108 113*  CO2 24 21* 23 20* 21*  GLUCOSE 160* 152* 146* 130* 168*  BUN '19 19 23 '$ 40* 45*  CREATININE 1.05* 0.91 0.98 1.24* 1.60*  CALCIUM 8.5* 9.1 9.0 9.2 9.1  MG  --   --  2.3 2.5*  --    GFR: Estimated Creatinine Clearance: 19.5 mL/min (A) (by C-G formula based on SCr of 1.6 mg/dL (H)). Liver Function Tests: Recent Labs  Lab 01/24/22 1942 01/25/22 0527  AST 29 31  ALT 16 23  ALKPHOS 51 51  BILITOT 1.0 1.1  PROT 7.4 7.6  ALBUMIN 4.2 4.2   No results for input(s): "LIPASE", "AMYLASE" in the last 168 hours. No results for input(s): "AMMONIA" in the last 168 hours. Coagulation Profile: Recent Labs  Lab 01/25/22 0527  INR 1.0   Cardiac Enzymes: No results for input(s): "CKTOTAL", "CKMB", "CKMBINDEX", "TROPONINI" in the last 168 hours. BNP (last 3 results) No results for input(s): "PROBNP" in the last 8760 hours. HbA1C: No results for input(s): "HGBA1C" in the last 72 hours. CBG: Recent Labs  Lab 01/27/22 0957  GLUCAP 145*   Lipid Profile: No results for input(s): "CHOL", "HDL", "LDLCALC", "TRIG", "CHOLHDL",  "LDLDIRECT" in the last 72 hours. Thyroid Function Tests: No results for input(s): "TSH", "T4TOTAL", "FREET4", "T3FREE", "THYROIDAB" in the last 72 hours. Anemia Panel: No results for input(s): "VITAMINB12", "FOLATE", "FERRITIN", "TIBC", "IRON", "RETICCTPCT" in the last 72 hours. Sepsis Labs: No results for input(s): "PROCALCITON", "LATICACIDVEN" in the last 168 hours.  No results found for this or any previous visit (from the past 240 hour(s)).       Radiology Studies: No results found.      Scheduled Meds:  amLODipine  5 mg Oral Daily   aspirin EC  81 mg Oral BID   docusate sodium  100 mg Oral BID   feeding supplement  237 mL Oral BID BM   multivitamin with minerals  1 tablet Oral Daily   pantoprazole  40 mg Oral Daily   Continuous Infusions:  dextrose 5 % and 0.45 % NaCl with KCl 40 mEq/L 75 mL/hr at 01/28/22  1644   tranexamic acid       LOS: 5 days     Sidney Ace, MD [facility]   If 7PM-7AM, please contact night-coverage  01/29/2022, 11:25 AM

## 2022-01-29 NOTE — Plan of Care (Signed)

## 2022-01-29 NOTE — TOC Progression Note (Addendum)
Transition of Care [facility]) - Progression Note    Patient Details  Name: Katie Bell MRN: [id number] Date of Birth: 1940/09/06  [facility] CM/SW [place], [place] Phone Number: 01/29/2022, 11:07 AM  Clinical Narrative:     The patient PASSR is [id number]. Patient has acceptances from [facility] and [facility]. Current insurance through [employer].  1405: The patient has selected [facility] as her SNF of choice. PASSR has been completed. SW obtaining insurance authorization from [place]. MD will inform SW when patient is medically ready.  Expected Discharge Plan: [place] Barriers to Discharge: Continued Medical Work up  Expected Discharge Plan and Services Expected Discharge Plan: [place] In-house Referral: Clinical Social Work                                             Social Determinants of Health (SDOH) Interventions    Readmission Risk Interventions     No data to display

## 2022-01-29 NOTE — Progress Notes (Addendum)
PT Cancellation Note  Patient Details Name: Katie Bell MRN: [id number] DOB: [birth year]   Cancelled Treatment:    Reason Eval/Treat Not Completed: Fatigue/lethargy limiting ability to participate (2 attempts made to see patient today for PT. Pt is awake on both accounts, but remains slurred in words and preference to eyes closed.) Pt tangential throughout most of interaction. Cues multiple times to open eyes, which do not remain open for >2-3 seconds, also auditory localization of gaze curiously off quite a bit upon eye opening, roughly 25-30 degrees off toward the Left side with saccadic and cervical rotation righting to the right to author's gaze. Pupils equal and both around 78m. Pt not following cues for leg exercise on Left side which seems more a cognitive issue than anything else today. Heels are elevated, HOB elevated to 40 degrees, alarm armed. Pt appears comfortable at exit, all needs met. Of note, pt was in recliner x1 hour at first attempt with lunch tray presented, then already back to bed upon return for 2nd attempt.   2:22 PM, 01/29/22 AEtta Grandchild PT, DPT Physical Therapist - [facility] [phone]([place]    BW[place]C 01/29/2022, 2:18 PM

## 2022-01-30 ENCOUNTER — Inpatient Hospital Stay: Payer: Medicare HMO

## 2022-01-30 ENCOUNTER — Inpatient Hospital Stay: Admit: 2022-01-30 | Payer: Medicare HMO

## 2022-01-30 DIAGNOSIS — R0602 Shortness of breath: Secondary | ICD-10-CM | POA: Diagnosis not present

## 2022-01-30 DIAGNOSIS — I63413 Cerebral infarction due to embolism of bilateral middle cerebral arteries: Secondary | ICD-10-CM

## 2022-01-30 DIAGNOSIS — G936 Cerebral edema: Secondary | ICD-10-CM | POA: Diagnosis not present

## 2022-01-30 DIAGNOSIS — S72141A Displaced intertrochanteric fracture of right femur, initial encounter for closed fracture: Secondary | ICD-10-CM | POA: Diagnosis not present

## 2022-01-30 LAB — CBC
HCT: 28.5 % — ABNORMAL LOW (ref 36.0–46.0)
Hemoglobin: 9.7 g/dL — ABNORMAL LOW (ref 12.0–15.0)
MCH: 32.2 pg (ref 26.0–34.0)
MCHC: 34 g/dL (ref 30.0–36.0)
MCV: 94.7 fL (ref 80.0–100.0)
Platelets: 105 10*3/uL — ABNORMAL LOW (ref 150–400)
RBC: 3.01 MIL/uL — ABNORMAL LOW (ref 3.87–5.11)
RDW: 12.2 % (ref 11.5–15.5)
WBC: 12.4 10*3/uL — ABNORMAL HIGH (ref 4.0–10.5)
nRBC: 0 % (ref 0.0–0.2)

## 2022-01-30 LAB — GLUCOSE, CAPILLARY: Glucose-Capillary: 144 mg/dL — ABNORMAL HIGH (ref 70–99)

## 2022-01-30 LAB — D-DIMER, QUANTITATIVE: D-Dimer, Quant: >20 ug/mL-FEU — ABNORMAL HIGH (ref 0.00–0.50)

## 2022-01-30 LAB — BASIC METABOLIC PANEL
Anion gap: 9 (ref 5–15)
BUN: 49 mg/dL — ABNORMAL HIGH (ref 8–23)
CO2: 18 mmol/L — ABNORMAL LOW (ref 22–32)
Calcium: 8.4 mg/dL — ABNORMAL LOW (ref 8.9–10.3)
Chloride: 111 mmol/L (ref 98–111)
Creatinine, Ser: 2.11 mg/dL — ABNORMAL HIGH (ref 0.44–1.00)
GFR, Estimated: 23 mL/min — ABNORMAL LOW (ref >60)
Glucose, Bld: 131 mg/dL — ABNORMAL HIGH (ref 70–99)
Potassium: 3.5 mmol/L (ref 3.5–5.1)
Sodium: 138 mmol/L (ref 135–145)

## 2022-01-30 LAB — LACTIC ACID, PLASMA
Lactic Acid, Venous: 3.4 mmol/L (ref 0.5–1.9)
Lactic Acid, Venous: 2.8 mmol/L (ref 0.5–1.9)

## 2022-01-30 LAB — PROCALCITONIN: Procalcitonin: 5.36 ng/mL

## 2022-01-30 LAB — MRSA NEXT GEN BY PCR, NASAL: MRSA by PCR Next Gen: NOT DETECTED

## 2022-01-30 MED ORDER — TECHNETIUM TO 99M ALBUMIN AGGREGATED
4.2400 | Freq: Once | INTRAVENOUS | Status: AC | PRN
  Administered 2022-01-30: 4.24 via INTRAVENOUS

## 2022-01-30 MED ORDER — VANCOMYCIN HCL IN DEXTROSE 1-5 GM/200ML-% IV SOLN
1000.0000 mg | Freq: Once | INTRAVENOUS | Status: AC
  Administered 2022-01-30: 1000 mg via INTRAVENOUS
  Filled 2022-01-30: qty 200

## 2022-01-30 MED ORDER — POLYETHYLENE GLYCOL 3350 17 G PO PACK
17.0000 g | PACK | Freq: Every day | ORAL | Status: DC
  Filled 2022-01-30: qty 1

## 2022-01-30 MED ORDER — SODIUM CHLORIDE 0.9 % IV SOLN
2.0000 g | INTRAVENOUS | Status: DC
  Administered 2022-01-30 – 2022-02-09 (×9): 2 g via INTRAVENOUS
  Filled 2022-01-30 (×3): qty 20
  Filled 2022-01-30: qty 2
  Filled 2022-01-30: qty 20
  Filled 2022-01-30 (×2): qty 2
  Filled 2022-01-30: qty 20
  Filled 2022-01-30: qty 2
  Filled 2022-01-30 (×2): qty 20

## 2022-01-30 MED ORDER — HYDRALAZINE HCL 20 MG/ML IJ SOLN: 5.0000 mg | INTRAMUSCULAR | Status: DC | PRN

## 2022-01-30 MED ORDER — VANCOMYCIN VARIABLE DOSE PER UNSTABLE RENAL FUNCTION (PHARMACIST DOSING): Status: DC

## 2022-01-30 MED ORDER — ACETAMINOPHEN 10 MG/ML IV SOLN
1000.0000 mg | Freq: Four times a day (QID) | INTRAVENOUS | Status: AC | PRN
  Administered 2022-01-30: 1000 mg via INTRAVENOUS
  Filled 2022-01-30: qty 100

## 2022-01-30 MED ORDER — ACETAMINOPHEN 650 MG RE SUPP
650.0000 mg | RECTAL | Status: DC | PRN
  Administered 2022-01-30: 650 mg via RECTAL
  Filled 2022-01-30: qty 1

## 2022-01-30 MED ORDER — CLOPIDOGREL BISULFATE 75 MG PO TABS
75.0000 mg | ORAL_TABLET | Freq: Every day | ORAL | Status: DC
  Administered 2022-02-02 – 2022-02-06 (×5): 75 mg via ORAL
  Filled 2022-01-30 (×5): qty 1

## 2022-01-30 MED ORDER — GADOBUTROL 1 MMOL/ML IV SOLN
5.0000 mL | Freq: Once | INTRAVENOUS | Status: AC | PRN
  Administered 2022-01-30: 5 mL via INTRAVENOUS

## 2022-01-30 MED ORDER — NALOXONE HCL 0.4 MG/ML IJ SOLN
0.4000 mg | INTRAMUSCULAR | Status: DC | PRN
  Administered 2022-01-30: 0.4 mg via INTRAVENOUS
  Filled 2022-01-30: qty 1

## 2022-01-30 MED ORDER — SODIUM CHLORIDE 0.9 % IV BOLUS
1000.0000 mL | Freq: Once | INTRAVENOUS | Status: AC
  Administered 2022-01-30: 1000 mL via INTRAVENOUS

## 2022-01-30 NOTE — Significant Event (Signed)
Rapid Response Event Note   Reason for Call :  Hypertension, tachycardia, and tachypnea  Initial Focused Assessment:  Patient RR 37 on arrival to room 02 97% on 3L Patient working hard to breath but lungs are clear throughout.  HR 143 on dina map and BP 163/103. Patient at baseline mentation of alert to self. Upon arrival to patients room patient answered yes pain and feeling hot. Patient unable to express where pain it.    Interventions:  -EKG -Chest Xray -PRN Dilaudid - Labs  Plan of Care:  Plan is to rule out PE and infection. After reciving pain medications prior to rapid nurse leaving the room patients work of breathing, blood pressure, and heart rate had decreased.  BP 140/62 HR 122 RR 24 O2 97 on 3L.     MD Notified: Neomia Glass NP Call [place]  Gonzella Lex, RN/

## 2022-01-30 NOTE — Progress Notes (Addendum)
PROGRESS NOTE    Katie Bell  JTT:[id number] DOB: [birth year] DOA: 01/24/2022 PCP: Venia Carbon, MD    Brief Narrative:  81 y.o. female with medical history significant of essential hypertension, anxiety disorder, previous history of basal cell carcinoma, GERD and kidney stones who presents to the ER after sustaining a mechanical fall at home.  Patient apparently slipped and fell down her stairs.  Sustained pain on the right hip with noticeable deformity.  Did not hit her head.  No evidence of other bodily injuries.  Patient brought to the ER where she was noted to have right intertrochanteric fracture.  Orthopedics consulted and plan to do operative repair 10/5.  Patient being admitted to the medical service.  She has no new complaints.  Pain is well controlled for the most part.  10/6: Status post IM nail for right hip fracture.  Seen by orthopedics.  Okay for DVT prophylaxis.  Patient a bit more confused this morning.  10/ 7: Mental status appears improved.  Patient more alert and communicative this morning  10/8: Sitting up in bed.  Attempting to eat.  Very poor p.o. intake.  10/10: Patient had RRT called early this a.m. due to alterations in mental status, respiratory distress, accelerated hypertension and tachycardia associated with tachypnea.  Cross cover responded to bedside.  Patient apparently with constricted pupils.  On my evaluation patient is essentially at her baseline over the past couple days.  Advised RN to avoid any narcotic administration.  0.4 Narcan given with improvement in mental status.  Vital signs of improved as well.  Follow-up head CT discussed with radiology.  Demonstrates acute ischemic infarct in parietal lobe.  Neurology consulted.   Assessment & Plan:   Principal Problem:   Closed right femoral fracture [facility]) Active Problems:   Hypertension   Leucocytosis   Fall at home, initial encounter  Acute CVA Noted on head CT after RRT and change in mental  status.  Unknown last time well. Plan: Telemetry monitoring Continue aspirin Neurology regarding addition of antiplatelet agent Permissive hypertension Echo with bubble MRI brain with and without contrast Unable to do CT angio due to acute kidney injury Again will defer to neurology regarding necessity vascular work-up  Tachycardia Hypoxia Tachypnea Worsened early this morning.  Unclear whether this is related to acute CVA seen on imaging Plan: Need to rule out pulmonary embolism Unable to get CT angio thorax due to AKI We will get VQ scan  Acute kidney injury Patient was saline locked overnight for some reason Suspect prerenal azotemia Plan: Restart IVF 100 cc/h Bolus 1 L normal saline Daily BMP Avoid nephrotoxins Encourage p.o. fluid intake   Right intertrochanteric hip fracture Secondary to mechanical fall.  No evidence of syncopal event.  Medically stable at this time.  Status post IM nail for right intertrochanteric hip fracture on 10/5.  Tolerated procedure well. Postoperative pain mild Postoperative oral intake has been poor Plan: Postoperative care dressing changes per nursing staff Continue prophylaxis aspirin 81 mg twice daily Therapy evaluations as tolerated Out of bed as tolerated   Poor p.o. intake Nutritional supplementation Assistance with meals  Essential hypertension Continue home amlodipine  Hypokalemia Monitor and replace as necessary  Leukocytosis Suspect secondary to stress reaction.  No obvious source of infection.  Will monitor white count  GERD Twice daily PPI for now   DVT prophylaxis: Aspirin 81 mg twice daily Code Status: Full Family Communication: Lauro Regulus [phone] on 10/5, 10/6, 10/8, 10/10 Disposition Plan: Status is:  Inpatient Remains inpatient appropriate because: Hip fracture status postoperative repair 10/5.  Will need skilled nursing facility.  Likely new CVA seen on imaging.  Neurology consult pending.   Level  of care: Med-Surg  Consultants:  Orthopedic surgery  Procedures:  Hip fracture IM nail 10/5  Antimicrobials: None   Subjective: Lying in bed.  Lethargic but does answer questions appropriately.  Objective: Vitals:   01/30/22 0553 01/30/22 0623 01/30/22 0806 01/30/22 1041  BP: (!) 140/62 (!) 110/57 (!) 141/73 126/64  Pulse: (!) 122 (!) 114 (!) 108 (!) 116  Resp: (!) '24 19 20 20  '$ Temp:      TempSrc:      SpO2: 96% 100% 97% 92%  Weight:      Height:        Intake/Output Summary (Last 24 hours) at 01/30/2022 1357 Last data filed at 01/29/2022 1900 Gross per 24 hour  Intake 240 ml  Output --  Net 240 ml   Filed Weights   01/24/22 1614  Weight: 49.9 kg    Examination:  General exam: NAD.  Appears fatigued and lethargic Respiratory system: Lungs clear.  Normal work of breathing.  Room air Cardiovascular system: Tachycardic, regular rhythm, no murmurs Gastrointestinal system: Soft, NT/ND, normal bowel sounds Central nervous system: Alert and oriented. No focal neurological deficits. Extremities: Hip surgical dressing CDI.  Decreased ROM Skin: No rashes, lesions or ulcers Psychiatry: Judgement and insight appear impaired. Mood & affect flattened.     Data Reviewed: I have personally reviewed following labs and imaging studies  CBC: Recent Labs  Lab 01/24/22 1623 01/25/22 0527 01/27/22 0442 01/27/22 1034 01/28/22 0740 01/29/22 0936 01/30/22 0637  WBC 11.8*   < > 17.5* 16.3* 15.8* 16.5* 12.4*  NEUTROABS 10.0*  --   --  14.8* 13.8* 13.8*  --   HGB 13.7   < > 11.8* 11.6* 10.5* 9.9* 9.7*  HCT 39.9   < > 34.6* 33.9* 30.6* 28.8* 28.5*  MCV 93.0   < > 93.5 92.9 92.7 92.0 94.7  PLT 192   < > 128* 126* 136* 145* 105*   < > = values in this interval not displayed.   Basic Metabolic Panel: Recent Labs  Lab 01/27/22 0442 01/27/22 1034 01/28/22 0740 01/29/22 0904 01/30/22 0637  NA 139 140 138 141 138  K 3.2* 3.0* 2.9* 3.6 3.5  CL 108 109 108 113* 111  CO2  21* 23 20* 21* 18*  GLUCOSE 152* 146* 130* 168* 131*  BUN 19 23 40* 45* 49*  CREATININE 0.91 0.98 1.24* 1.60* 2.11*  CALCIUM 9.1 9.0 9.2 9.1 8.4*  MG  --  2.3 2.5*  --   --    GFR: Estimated Creatinine Clearance: 14.8 mL/min (A) (by C-G formula based on SCr of 2.11 mg/dL (H)). Liver Function Tests: Recent Labs  Lab 01/24/22 1942 01/25/22 0527  AST 29 31  ALT 16 23  ALKPHOS 51 51  BILITOT 1.0 1.1  PROT 7.4 7.6  ALBUMIN 4.2 4.2   No results for input(s): "LIPASE", "AMYLASE" in the last 168 hours. No results for input(s): "AMMONIA" in the last 168 hours. Coagulation Profile: Recent Labs  Lab 01/25/22 0527  INR 1.0   Cardiac Enzymes: No results for input(s): "CKTOTAL", "CKMB", "CKMBINDEX", "TROPONINI" in the last 168 hours. BNP (last 3 results) No results for input(s): "PROBNP" in the last 8760 hours. HbA1C: No results for input(s): "HGBA1C" in the last 72 hours. CBG: Recent Labs  Lab 01/27/22 0957 01/30/22 0539  GLUCAP 145* 144*   Lipid Profile: No results for input(s): "CHOL", "HDL", "LDLCALC", "TRIG", "CHOLHDL", "LDLDIRECT" in the last 72 hours. Thyroid Function Tests: No results for input(s): "TSH", "T4TOTAL", "FREET4", "T3FREE", "THYROIDAB" in the last 72 hours. Anemia Panel: No results for input(s): "VITAMINB12", "FOLATE", "FERRITIN", "TIBC", "IRON", "RETICCTPCT" in the last 72 hours. Sepsis Labs: Recent Labs  Lab 01/30/22 0637 01/30/22 0754  PROCALCITON 5.36  --   LATICACIDVEN 3.4* 2.8*    No results found for this or any previous visit (from the past 240 hour(s)).       Radiology Studies: CT HEAD WO CONTRAST (5MM)  Result Date: 01/30/2022 CLINICAL DATA:  Mental status change. EXAM: CT HEAD WITHOUT CONTRAST TECHNIQUE: Contiguous axial images were obtained from the base of the skull through the vertex without intravenous contrast. RADIATION DOSE REDUCTION: This exam was performed according to the departmental dose-optimization program which  includes automated exposure control, adjustment of the mA and/or kV according to patient size and/or use of iterative reconstruction technique. COMPARISON:  01/24/22 CT Brain FINDINGS: Limitations: Motion degraded exam. Brain: Compared to 01/24/2022 CT head, there is a new infarct in the right parietal lobe. No evidence of hemorrhage. No extra-axial fluid collection. No hydrocephalus. Vascular: No hyperdense vessel or unexpected calcification. Skull: Normal. Negative for fracture or focal lesion. Sinuses/Orbits: No acute finding. Other: None. IMPRESSION: New right parietal lobe infarct. No evidence of hemorrhage. These results will be called to the ordering clinician or representative by the Radiologist Assistant, and communication documented in the PACS or [employer]. Electronically Signed   By: Marin Roberts M.D.   On: 01/30/2022 09:51   DG Chest Port 1 View  Result Date: 01/30/2022 CLINICAL DATA:  81 year old female with shortness of breath. EXAM: PORTABLE CHEST 1 VIEW COMPARISON:  Portable chest 01/24/2022 and earlier. FINDINGS: Portable AP upright view at 0547 hours. Larger lung volumes. Mediastinal contours are within normal limits. Visualized tracheal air column is within normal limits. Allowing for portable technique the lungs are clear. No pneumothorax or pleural effusion. No acute osseous abnormality identified. Negative visible bowel gas. IMPRESSION: No acute cardiopulmonary abnormality. Electronically Signed   By: Genevie Ann M.D.   On: 01/30/2022 06:56        Scheduled Meds:  aspirin EC  81 mg Oral BID   docusate sodium  100 mg Oral BID   feeding supplement  237 mL Oral BID BM   multivitamin with minerals  1 tablet Oral Daily   pantoprazole  40 mg Oral Daily   Continuous Infusions:  dextrose 5 % and 0.45 % NaCl with KCl 40 mEq/L 100 mL/hr at 01/30/22 1232   tranexamic acid       LOS: 6 days     Sidney Ace, MD [facility]   If 7PM-7AM, please contact  night-coverage  01/30/2022, 1:57 PM

## 2022-01-30 NOTE — Progress Notes (Signed)
       CROSS COVER NOTE  NAME: MICAELLA GITTO MRN: [id number] DOB : 02-27-1941    Date of Service   01/30/2022   HPI/Events of Note   Rapid response called for respiratory distress, hypertension, tachycardia, and tachypnea.   BP 203/186--> 163/103 HR 145 RR 41 SPO2 96% on 3L via [place] Temp 37F  On arrival to bedside M(r)s [place] is somnolent and responds yes/no to questions. RN reports mental status is unchanged. Lungs are clear to auscultation. RN reports patient's increased work of breathing is already much improved on my arrival. When asked if she is in pain M(r)s [place] endorses pain and feeling warm. PRN Dilaudid dose given.  At conclusion of rapid vitals are as below:  BP 140/62 HR 122 RR 24 SPO2 96% on 3L via [place]  0640: Notified of change in size of bilateral pupils from 56m to 262mwithout change in mentation or focal deficits. Likely due to Dilaudid administration. Monitor for change in mentation, respiratory status, and focal deficits.  Interventions   Assessment/Plan:  Dilaudid PRN dose given EKG--> Sinus Tachycardia HR 125 CXR--> Negative CBC, BMP, Ddimer, Lactic, Procalcitonin   Lactic Acidosis--> Lactic 3.4 IVF  Acute Kidney Injury secondary to dehydration and poor PO Intake Creatinine 2.11 increased from 1.6 yesterday. BUN 49 Restart IVF- RN reports patient has been saline locked and disconnected from fluids this shift   Workup still pending, overnight events signed out to Attending physician    This document was prepared using Dragon voice recognition software and may include unintentional dictation errors.  KaNeomia GlassNP, MBA, FNP-BC Nurse Practitioner [facility] ([phone]

## 2022-01-30 NOTE — Plan of Care (Signed)

## 2022-01-30 NOTE — Progress Notes (Signed)
PT Cancellation Note  Patient Details Name: Katie Bell MRN: [id number] DOB: Mar 29, 1941   Cancelled Treatment:    Reason Eval/Treat Not Completed: Medical issues which prohibited therapy (Chart reviewed- pt noted to be tachycardic (120s) and febrile (>102) at last vitals check. Of note: earlier findings of acute cerebral infarct. Will defer PT treatment to later date/time once medically ready.)  3:00 PM, 01/30/22 Etta Grandchild, PT, DPT Physical Therapist - [facility]  [phone] ([place])    Joash Tony C 01/30/2022, 3:00 PM

## 2022-01-30 NOTE — TOC Progression Note (Signed)
Transition of Care [facility]) - Progression Note    Patient Details  Name: Katie Bell MRN: [id number] Date of Birth: 01-10-1941  [facility]) CM/SW [place], RN Phone Number: 01/30/2022, 4:20 PM  Clinical Narrative:    Requested that the Ins request be withdrawn until she is able to work with PT, otherwise will likely get a denial   Expected Discharge Plan: [place] Barriers to Discharge: Continued Medical Work up  Expected Discharge Plan and Services Expected Discharge Plan: [place] In-house Referral: Clinical Social Work                                             Social Determinants of Health (SDOH) Interventions    Readmission Risk Interventions     No data to display

## 2022-01-30 NOTE — Progress Notes (Signed)
   01/30/22 0530  Assess: MEWS Score  BP (!) 203/186  MAP (mmHg) 193  Pulse Rate (!) 156  Resp (!) 41  Assess: MEWS Score  MEWS Temp 0  MEWS Systolic 2  MEWS Pulse 3  MEWS RR 3  MEWS LOC 0  MEWS Score 8  MEWS Score Color Red  Assess: if the MEWS score is Yellow or Red  Were vital signs taken at a resting state? Yes  Does the patient meet 2 or more of the SIRS criteria? Yes  Does the patient have a confirmed or suspected source of infection? No  MEWS guidelines implemented *See Row Information* No, vital signs rechecked  Treat  MEWS Interventions Administered prn meds/treatments  Take Vital Signs  Increase Vital Sign Frequency  Red: Q 1hr X 4 then Q 4hr X 4, if remains red, continue Q 4hrs  Escalate  MEWS: Escalate Red: discuss with charge nurse/RN and provider, consider discussing with RRT  Notify: Charge Nurse/RN  Name of Charge Nurse/RN Notified Airline pilot, RN  Date Charge Nurse/RN Notified 01/30/22  Time Charge Nurse/RN Notified 2563  Notify: Provider  Provider Name/Title Neomia Glass, NP  Date Provider Notified 01/30/22  Time Provider Notified 0532  Method of Notification Page;Face-to-face  Notification Reason Change in status (new tachypnea and tachycardia)  Provider response At bedside;See new orders  Notify: Rapid Response  Name of Rapid Response RN Notified Mariah, RN  Date Rapid Response Notified 01/30/22  Time Rapid Response Notified 0530  Assess: SIRS CRITERIA  SIRS Temperature  0  SIRS Pulse 1  SIRS Respirations  1  SIRS WBC 1  SIRS Score Sum  3

## 2022-01-30 NOTE — TOC Progression Note (Signed)
Transition of Care [facility]) - Progression Note    Patient Details  Name: Katie Bell MRN: [id number] Date of Birth: May 30, 1940  Transition of Care [facility]) CM/SW [place], RN Phone Number: 01/30/2022, 1:59 PM  Clinical Narrative:     Ins is pending ref number [id number] to go to [facility]  Expected Discharge Plan: [place] Barriers to Discharge: Continued Medical Work up  Expected Discharge Plan and Services Expected Discharge Plan: [place] In-house Referral: Clinical Social Work                                             Social Determinants of Health (SDOH) Interventions    Readmission Risk Interventions     No data to display

## 2022-01-30 NOTE — Consult Note (Signed)
Pharmacy Antibiotic Note  Katie Bell is a 81 y.o. female admitted on 01/24/2022 with  acute CVA, AKI, right intertrochanteric hip fracture s/p IM nailing on 10/5. Patient further with tachycardia, tachypnea, and hypoxia which acutely worsened 10/10 AM requiring supplemental oxygen. Persistent leukocytosis though slightly improved, lactate elevated, febrile. CXR no acute abnormalities. D-dimer markedly elevated but VQ scan with low probability PE. Pharmacy has been consulted for vancomycin dosing. Patient is also ordered ceftriaxone.  Plan:  Vancomycin 1 g IV LD --Dose per levels for now given unstable renal function --Daily Scr per protocol --Continue to assess for timing of levels versus start of maintenance regimen based on Scr trend  Height: 5' (152.4 cm) Weight: 49.9 kg (110 lb) IBW/kg (Calculated) : 45.5  Temp (24hrs), Avg:100 F (37.8 C), Min:97.8 F (36.6 C), Max:102.1 F (38.9 C)  Recent Labs  Lab 01/27/22 0442 01/27/22 1034 01/28/22 0740 01/29/22 0904 01/29/22 0936 01/30/22 0637 01/30/22 0754  WBC 17.5* 16.3* 15.8*  --  16.5* 12.4*  --   CREATININE 0.91 0.98 1.24* 1.60*  --  2.11*  --   LATICACIDVEN  --   --   --   --   --  3.4* 2.8*    Estimated Creatinine Clearance: 14.8 mL/min (A) (by C-G formula based on SCr of 2.11 mg/dL (H)).    Allergies  Allergen Reactions   Sulfamethoxazole-Trimethoprim Swelling    Angioedema and pruritis per 01/01/2017 documentation in care everywhere   Estradiol Other (See Comments)    Vaginal irritation Other reaction(s): Other (See Comments) Vaginal irritation   Morphine     agitation   Sertraline Hcl Other (See Comments)    Migraine headaches, severe   Morphine And Related Anxiety    Antimicrobials this admission: Cefazolin 10/5 >> 10/6 (surgical prophylaxis) Ceftriaxone 10/10 >>  Vancomycin 10/10 >>   Dose adjustments this admission: N/A  Microbiology results: 10/10 BCx: pending 10/10 UCx: pending  10/10 MRSA  PCR: pending  Thank you for allowing pharmacy to be a part of this patient's care.  Benita Gutter 01/30/2022 5:14 PM

## 2022-01-30 NOTE — Progress Notes (Signed)
Upon rounding, patient tachypneic and tachycardic. RR 42 and HR 156. Pt also wheezing and SOB when trying to talk cannot get words out. Patient placed on 3L [place] and repositioned in the bed. Charge RN notified and rapid response called due to acute respiratory distress. NP on call notified of calling rapid and arrived at bedside. BG was 144. EKG obtained which showed sinus tachycardia. 0.5 mg of dilaudid given per [place], NP and RR now down to 19. Manual bp obtained and was 140/62. CXR obtained and stat labs ordered. Pt also noted to have pinpoint pupils smaller than prior in the shift. NP on call notified of pupils. Patient also placed on telemetry.

## 2022-01-30 NOTE — Progress Notes (Signed)
OT Cancellation Note  Patient Details Name: Katie Bell MRN: [id number] DOB: 22-Jun-1940   Cancelled Treatment:    Reason Eval/Treat Not Completed: Other (comment). Chart reviewed. Pt noted with rapid response this morning. MRI reveals + new R parietal lobe infarct. Neurology consulted. Will hold OT today and re-attempt next date as medically appropriate and pending updated plan of care.   Ardeth Perfect., MPH, MS, OTR/L ascom [phone] 01/30/22, 11:49 AM

## 2022-01-30 NOTE — Progress Notes (Signed)
Lactic acid 3.4, Dr Priscella Mann notified of this along with rapid response event from earlier this AM.  Notified that she is still very lethargic and pupils are pinpoint.  Bolus was ordered and started along with Narcan, pt started to become more restless just after narcan given.  Pupil also became more reactive.  MD ordered more tests for pt, will continue to assess pt.

## 2022-01-31 ENCOUNTER — Inpatient Hospital Stay (HOSPITAL_COMMUNITY)
Admit: 2022-01-31 | Discharge: 2022-01-31 | Disposition: A | Discharge status: Home / Self Care | Payer: Medicare HMO | Attending: Internal Medicine | Admitting: Internal Medicine

## 2022-01-31 ENCOUNTER — Inpatient Hospital Stay: Payer: Medicare HMO

## 2022-01-31 ENCOUNTER — Encounter: Payer: Self-pay | Admitting: Internal Medicine

## 2022-01-31 DIAGNOSIS — S72001A Fracture of unspecified part of neck of right femur, initial encounter for closed fracture: Secondary | ICD-10-CM | POA: Diagnosis not present

## 2022-01-31 DIAGNOSIS — D72823 Leukemoid reaction: Secondary | ICD-10-CM | POA: Diagnosis not present

## 2022-01-31 DIAGNOSIS — W19XXXA Unspecified fall, initial encounter: Secondary | ICD-10-CM | POA: Diagnosis not present

## 2022-01-31 DIAGNOSIS — N179 Acute kidney failure, unspecified: Secondary | ICD-10-CM | POA: Diagnosis not present

## 2022-01-31 DIAGNOSIS — A419 Sepsis, unspecified organism: Secondary | ICD-10-CM | POA: Diagnosis not present

## 2022-01-31 DIAGNOSIS — D72825 Bandemia: Secondary | ICD-10-CM

## 2022-01-31 DIAGNOSIS — B961 Klebsiella pneumoniae [K. pneumoniae] as the cause of diseases classified elsewhere: Secondary | ICD-10-CM | POA: Diagnosis not present

## 2022-01-31 DIAGNOSIS — I6389 Other cerebral infarction: Secondary | ICD-10-CM | POA: Diagnosis not present

## 2022-01-31 DIAGNOSIS — R6520 Severe sepsis without septic shock: Secondary | ICD-10-CM | POA: Diagnosis not present

## 2022-01-31 DIAGNOSIS — R29818 Other symptoms and signs involving the nervous system: Secondary | ICD-10-CM | POA: Diagnosis not present

## 2022-01-31 DIAGNOSIS — R7881 Bacteremia: Secondary | ICD-10-CM | POA: Diagnosis not present

## 2022-01-31 DIAGNOSIS — I639 Cerebral infarction, unspecified: Secondary | ICD-10-CM

## 2022-01-31 DIAGNOSIS — Y92009 Unspecified place in unspecified non-institutional (private) residence as the place of occurrence of the external cause: Secondary | ICD-10-CM | POA: Diagnosis not present

## 2022-01-31 DIAGNOSIS — S72141A Displaced intertrochanteric fracture of right femur, initial encounter for closed fracture: Secondary | ICD-10-CM | POA: Diagnosis not present

## 2022-01-31 LAB — BLOOD CULTURE ID PANEL (REFLEXED) - BCID2

## 2022-01-31 LAB — ECHOCARDIOGRAM COMPLETE BUBBLE STUDY
AR max vel: 1.81 cm2
AV Area VTI: 1.87 cm2
AV Area mean vel: 1.77 cm2
AV Mean grad: 3 mmHg
AV Peak grad: 5 mmHg
Ao pk vel: 1.12 m/s
Area-P 1/2: 4.86 cm2
S' Lateral: 2.2 cm

## 2022-01-31 LAB — URINALYSIS, COMPLETE (UACMP) WITH MICROSCOPIC
Bilirubin Urine: NEGATIVE
Glucose, UA: NEGATIVE mg/dL
Ketones, ur: NEGATIVE mg/dL
Nitrite: NEGATIVE
Protein, ur: NEGATIVE mg/dL
Specific Gravity, Urine: 1.018 (ref 1.005–1.030)
pH: 5 (ref 5.0–8.0)

## 2022-01-31 LAB — CBC WITH DIFFERENTIAL/PLATELET
Abs Immature Granulocytes: 2.6 10*3/uL — ABNORMAL HIGH (ref 0.00–0.07)
Abs Immature Granulocytes: 2.57 10*3/uL — ABNORMAL HIGH (ref 0.00–0.07)
Basophils Absolute: 0 10*3/uL (ref 0.0–0.1)
Basophils Absolute: 0.2 10*3/uL — ABNORMAL HIGH (ref 0.0–0.1)
Basophils Relative: 0 %
Basophils Relative: 0 %
Eosinophils Absolute: 0 10*3/uL (ref 0.0–0.5)
Eosinophils Absolute: 0.1 10*3/uL (ref 0.0–0.5)
Eosinophils Relative: 0 %
Eosinophils Relative: 0 %
HCT: 26 % — ABNORMAL LOW (ref 36.0–46.0)
HCT: 27.7 % — ABNORMAL LOW (ref 36.0–46.0)
Hemoglobin: 8.7 g/dL — ABNORMAL LOW (ref 12.0–15.0)
Hemoglobin: 9.3 g/dL — ABNORMAL LOW (ref 12.0–15.0)
Immature Granulocytes: 5 %
Immature Granulocytes: 6 %
Lymphocytes Relative: 2 %
Lymphocytes Relative: 3 %
Lymphs Abs: 1.2 10*3/uL (ref 0.7–4.0)
Lymphs Abs: 1.3 10*3/uL (ref 0.7–4.0)
MCH: 31.5 pg (ref 26.0–34.0)
MCH: 32.3 pg (ref 26.0–34.0)
MCHC: 33.5 g/dL (ref 30.0–36.0)
MCHC: 33.6 g/dL (ref 30.0–36.0)
MCV: 94.2 fL (ref 80.0–100.0)
MCV: 96.2 fL (ref 80.0–100.0)
Monocytes Absolute: 1.2 10*3/uL — ABNORMAL HIGH (ref 0.1–1.0)
Monocytes Absolute: 1 10*3/uL (ref 0.1–1.0)
Monocytes Relative: 2 %
Monocytes Relative: 2 %
Neutro Abs: 47.4 10*3/uL — ABNORMAL HIGH (ref 1.7–7.7)
Neutro Abs: 41.7 10*3/uL — ABNORMAL HIGH (ref 1.7–7.7)
Neutrophils Relative %: 91 %
Neutrophils Relative %: 89 %
Platelets: 96 10*3/uL — ABNORMAL LOW (ref 150–400)
Platelets: 100 10*3/uL — ABNORMAL LOW (ref 150–400)
RBC: 2.76 MIL/uL — ABNORMAL LOW (ref 3.87–5.11)
RBC: 2.88 MIL/uL — ABNORMAL LOW (ref 3.87–5.11)
RDW: 12.8 % (ref 11.5–15.5)
RDW: 13 % (ref 11.5–15.5)
Smear Review: NORMAL
Smear Review: NORMAL
WBC: 52.3 10*3/uL (ref 4.0–10.5)
WBC: 46.8 10*3/uL — ABNORMAL HIGH (ref 4.0–10.5)
nRBC: 0 % (ref 0.0–0.2)
nRBC: 0 % (ref 0.0–0.2)

## 2022-01-31 LAB — BASIC METABOLIC PANEL
Anion gap: 9 (ref 5–15)
BUN: 54 mg/dL — ABNORMAL HIGH (ref 8–23)
CO2: 18 mmol/L — ABNORMAL LOW (ref 22–32)
Calcium: 8 mg/dL — ABNORMAL LOW (ref 8.9–10.3)
Chloride: 114 mmol/L — ABNORMAL HIGH (ref 98–111)
Creatinine, Ser: 2.14 mg/dL — ABNORMAL HIGH (ref 0.44–1.00)
GFR, Estimated: 23 mL/min — ABNORMAL LOW (ref >60)
Glucose, Bld: 145 mg/dL — ABNORMAL HIGH (ref 70–99)
Potassium: 3.9 mmol/L (ref 3.5–5.1)
Sodium: 141 mmol/L (ref 135–145)

## 2022-01-31 LAB — VANCOMYCIN, RANDOM: Vancomycin Rm: 13 ug/mL

## 2022-01-31 LAB — LIPID PANEL
Cholesterol: 86 mg/dL (ref 0–200)
HDL: 25 mg/dL — ABNORMAL LOW (ref >40)
LDL Cholesterol: 47 mg/dL (ref 0–99)
Total CHOL/HDL Ratio: 3.4 RATIO
Triglycerides: 70 mg/dL (ref <150)
VLDL: 14 mg/dL (ref 0–40)

## 2022-01-31 LAB — HEMOGLOBIN A1C
Hgb A1c MFr Bld: 5.1 % (ref 4.8–5.6)
Mean Plasma Glucose: 99.67 mg/dL

## 2022-01-31 MED ORDER — ASPIRIN 300 MG RE SUPP
150.0000 mg | Freq: Every day | RECTAL | Status: DC
  Administered 2022-01-31 – 2022-02-01 (×2): 150 mg via RECTAL
  Filled 2022-01-31 (×3): qty 1

## 2022-01-31 NOTE — Progress Notes (Signed)
*  PRELIMINARY RESULTS* Echocardiogram 2D Echocardiogram has been performed.  Sherrie Sport 01/31/2022, 7:41 AM

## 2022-01-31 NOTE — Evaluation (Signed)
Occupational Therapy Re-Evaluation Patient Details Name: Katie Bell MRN: [id number] DOB: 12/09/1940 Today's Date: 01/31/2022   History of Present Illness Katie Bell is an 83yoF who comes to [facility] on 10/[facility] after a fall, scans revealing of right intertrochanteric femur fracture. PMH: HTN, GAD, basal cell carcinoma, GERD, renal lithiasis. Pt taken to theatre under direction of Renee Harder for IM nail fixation, then made WBAT. At baseline pt lives with son, most household AMB without device, still drives occasionally, no recent balance or falls issues. Rapid response was called on 10/10, pt found to have acute/subacute infarcts. Additional workup revealing for urinary retention and bacteremia.   Clinical Impression   Chart reviewed, re evaluation completed on this date due to RR/stroke work up 01/30/22. Hand off provided from PT. Pt is oriented to self only, fair one step direction following. MAX A required for STS attempt with RW, MAX A required for UB/LB dressing. Pt with coughing, use of suction required for removal of mucus from mouth. Pt with fair sitting balance on edge of bed. Pt continues to present with functional deficits affecting safe and optimal ADL completion, discharge recommendation remains appropriate.   Pt son in room, educated on NPO status as he was requesting to leave drink for pt. Pt is left in bed, safety intact, all needs met. OT will continue to follow acutely.      Recommendations for follow up therapy are one component of a multi-disciplinary discharge planning process, led by the attending physician.  Recommendations may be updated based on patient status, additional functional criteria and insurance authorization.   Follow Up Recommendations  Skilled nursing-short term rehab (<3 hours/day)    Assistance Recommended at Discharge Frequent or constant Supervision/Assistance  Patient can return home with the following A lot of help with walking and/or transfers;A  lot of help with bathing/dressing/bathroom;Assistance with cooking/housework;Assist for transportation;Help with stairs or ramp for entrance;Assistance with feeding;Direct supervision/assist for medications management;Direct supervision/assist for financial management    Functional Status Assessment  Patient has had a recent decline in their functional status and demonstrates the ability to make significant improvements in function in a reasonable and predictable amount of time.  Equipment Recommendations  BSC/3in1;Other (comment) (2ww)    Recommendations for Other Services       Precautions / Restrictions Precautions Precautions: Fall Restrictions Weight Bearing Restrictions: Yes RLE Weight Bearing: Weight bearing as tolerated      Mobility Bed Mobility Overal bed mobility: Needs Assistance Bed Mobility: Sit to Supine       Sit to supine: Max assist        Transfers Overall transfer level: Needs assistance Equipment used: Rolling walker (2 wheels) Transfers: Sit to/from Stand Sit to Stand: Max assist           General transfer comment: step by step vcs for technique      Balance Overall balance assessment: Needs assistance Sitting-balance support: Bilateral upper extremity supported, Feet unsupported Sitting balance-Leahy Scale: Fair       Standing balance-Leahy Scale: Zero                             ADL either performed or assessed with clinical judgement   ADL Overall ADL's : Needs assistance/impaired Eating/Feeding: NPO   Grooming: Wash/dry face;Moderate assistance;Sitting;Cueing for sequencing           Upper Body Dressing : Maximal assistance;Sitting   Lower Body Dressing: Maximal assistance  Vision   Additional Comments: will continue to assess     Perception     Praxis      Pertinent Vitals/Pain Pain Assessment Pain Assessment: Faces Faces Pain Scale: Hurts a little bit Pain Location:  R hip Pain Descriptors / Indicators: Aching, Moaning, Guarding, Grimacing Pain Intervention(s): Limited activity within patient's tolerance, Monitored during session, Repositioned     Hand Dominance Right   Extremity/Trunk Assessment Upper Extremity Assessment Upper Extremity Assessment: Generalized weakness;Difficult to assess due to impaired cognition;RUE deficits/detail;LUE deficits/detail RUE Deficits / Details: Shoulder flexion: AROM to 90 degrees, unable to maintain against resistance; elbow, wrist AROM WFL, weak grip strength LUE Deficits / Details: AROM appears WFL, strength 3+/5 throughout   Lower Extremity Assessment Lower Extremity Assessment: Defer to PT evaluation       Communication Communication Communication: Expressive difficulties   Cognition Arousal/Alertness: Awake/alert Behavior During Therapy: Impulsive Overall Cognitive Status: Impaired/Different from baseline Area of Impairment: Awareness, Memory, Orientation, Attention, Following commands, Safety/judgement                 Orientation Level: Disoriented to, Place, Time, Situation Current Attention Level: Focused Memory: Decreased recall of precautions, Decreased short-term memory Following Commands: Follows one step commands with increased time Safety/Judgement: Decreased awareness of safety, Decreased awareness of deficits Awareness: Emergent         General Comments       Exercises Other Exercises Other Exercises: use of suction to remove residual mucus from mouth   Shoulder Instructions      Home Living Family/patient expects to be discharged to:: Private residence Living Arrangements: Children Available Help at Discharge: Family;Available PRN/intermittently Type of Home: House Home Access: Stairs to enter [employer] of Steps: 5 Entrance Stairs-Rails: Right;Left Home Layout: One level               Home Equipment: None          Prior  Functioning/Environment Prior Level of Function : Patient poor historian/Family not available             Mobility Comments: per chart (and per son) pt amb without AD, driving short distances ADLs Comments: MOD I with ADL/IADL at baseline per chart review        OT Problem List: Decreased strength;Pain;Decreased cognition;Decreased safety awareness;Impaired balance (sitting and/or standing);Decreased knowledge of use of DME or AE;Decreased knowledge of precautions      OT Treatment/Interventions: Self-care/ADL training;Therapeutic exercise;Therapeutic activities;Cognitive remediation/compensation;DME and/or AE instruction;Patient/family education;Balance training    OT Goals(Current goals can be found in the care plan section) Acute Rehab OT Goals Patient Stated Goal: stand OT Goal Formulation: With patient/family Time For Goal Achievement: 02/14/22 Potential to Achieve Goals: Good  OT Frequency: Min 2X/week    Co-evaluation              AM-PAC OT "6 Clicks" Daily Activity     Outcome Measure Help from another person eating meals?: Total Help from another person taking care of personal grooming?: A Lot Help from another person toileting, which includes using toliet, bedpan, or urinal?: Total Help from another person bathing (including washing, rinsing, drying)?: A Lot Help from another person to put on and taking off regular upper body clothing?: A Lot Help from another person to put on and taking off regular lower body clothing?: A Lot 6 Click Score: 10   End of Session Equipment Utilized During Treatment: Gait belt;Rolling walker (2 wheels) Nurse Communication: Mobility status  Activity Tolerance: Patient tolerated treatment well Patient  left: in bed;with call bell/phone within reach;with bed alarm set;with family/visitor present  OT Visit Diagnosis: Other abnormalities of gait and mobility (R26.89);Muscle weakness (generalized) (M62.81);History of falling  (Z91.81);Pain                Time: 1145-1158 OT Time Calculation (min): 13 min Charges:  OT General Charges $OT Visit: 1 Visit OT Evaluation $OT Re-eval: 1 Re-eval OT Treatments $Self Care/Home Management : 8-22 mins  Shanon Payor, OTD OTR/L  01/31/22, 1:34 PM

## 2022-01-31 NOTE — Progress Notes (Signed)
Eeg done 

## 2022-01-31 NOTE — TOC Progression Note (Signed)
Transition of Care [facility]) - Progression Note    Patient Details  Name: Katie Bell MRN: [id number] Date of Birth: 07/29/1940  Transition of Care [facility]) CM/SW [place], RN Phone Number: 01/31/2022, 2:43 PM  Clinical Narrative:    Patient has worked with PT and OT and able to show progress, Ins auth Pending to go to [facility]   Expected Discharge Plan: [place] Barriers to Discharge: Continued Medical Work up  Expected Discharge Plan and Services Expected Discharge Plan: [place] In-house Referral: Clinical Social Work                                             Social Determinants of Health (SDOH) Interventions    Readmission Risk Interventions     No data to display

## 2022-01-31 NOTE — Progress Notes (Addendum)
Physical Therapy re-valuation Patient Details Name: Katie Bell MRN: [id number] DOB: 04-21-1941 Today's Date: 01/31/2022  History of Present Illness  Katie Bell is an 59yoF who comes [facility] on 10/[facility] after a fall, scans revealing of right intertrochanteric femur fracture. PMH: HTN, GAD, basal cell carcinoma, GERD, renal lithiasis. Pt taken to theatre under direction of Renee Harder for IM nail fixation, then made WBAT. At baseline pt lives with son, most household AMB without device, still drives occasionally, no recent balance or falls issues. Rapid response was called on 10/10, pt found to have acute/subacute infarcts. Additional workup revealing for urinary retention and bacteremia.  Clinical Impression  Pt admitted with above Dx. Pt has functional limitations due to deficits below (see "PT Problem List"). Pt presenting for re-evaluation this date after findings of new acute CVA, bacteremia. Pt more awake today for author than any other prior attempt since surgery. Pt very chatty initially, difficult to understand, ultimately noted to have a throughout full of thick mucous secretions which NSG assists with cleaning out, pt easier to understand thereafter. Pt begging for water, very thirsty/hungry, made aware of NPO status, but does not remember this for very long in session, as she is reminded several times. Pt maintains eyes open almost entire session, mentation much improved, but still has difficulty with follow commands >75% of time. MinA to come to EOB, good balance there. Min-modA to rise to standing for brief duration but weakness precludes being up >5 seconds each time. Son arrives at end of session, educated on what PT is working on with patient. Pain in RLE from ORIF seems to be a non issue, but cognition makes it difficult to get a full assessment, although pt does endorse bilat leg weakness. Pt appears to do well with spoons of water with NSG at beginning of session, but later at EOB  appears to be aspirating with spoon of water, coughing for several minutes, watering of eyes. Pt left with OT at bedside. Today pt requires considerable physical assistance to perform bed mobility, transfers, and short distance walking. [name] performance this date reveals decreased ability, independence, and tolerance in performing all basic mobility required for performance of activities of daily living. Pt requires additional DME, close physical assistance, and cues for safe participate in mobility. Pt will benefit from skilled PT intervention to increase independence and safety with basic mobility in preparation for discharge to the venue listed below.     No data found.       Recommendations for follow up therapy are one component of a multi-disciplinary discharge planning process, led by the attending physician.  Recommendations may be updated based on patient status, additional functional criteria and insurance authorization.  Follow Up Recommendations Skilled nursing-short term rehab (<3 hours/day) Can patient physically be transported by private vehicle: No    Assistance Recommended at Discharge Frequent or constant Supervision/Assistance  Patient can return home with the following  Two people to help with walking and/or transfers;Two people to help with bathing/dressing/bathroom;Direct supervision/assist for medications management;Direct supervision/assist for financial management;Assist for transportation;Assistance with cooking/housework;Assistance with feeding;Help with stairs or ramp for entrance    Equipment Recommendations None recommended by PT  Recommendations for Other Services       Functional Status Assessment       Precautions / Restrictions Precautions Precautions: Fall Restrictions RLE Weight Bearing: Weight bearing as tolerated      Mobility  Bed Mobility   Bed Mobility: Supine to Sit     Supine to  sit: Min assist     General bed mobility comments:  moving pretty well, instictively uses LUE to assist RLE to left EOB, but still needs minA due to weakness; no balance issues at EOB c static sitting    Transfers Overall transfer level: Needs assistance Equipment used: None (dependent hug transfer)               General transfer comment: does not tolerate >5sec, pain appears well controlled despite reduceed RLE weightbearing, cognition makes rating difficult    Ambulation/Gait                  Stairs            Wheelchair Mobility    Modified Rankin (Stroke Patients Only)       Balance                                             Pertinent Vitals/Pain Pain Assessment Pain Assessment: Faces Faces Pain Scale: Hurts a little bit    Home Living                          Prior Function                       Hand Dominance        Extremity/Trunk Assessment                Communication      Cognition Arousal/Alertness: Awake/alert Behavior During Therapy: Impulsive Overall Cognitive Status: Impaired/Different from baseline Area of Impairment: Awareness, Memory                     Memory: Decreased recall of precautions, Decreased short-term memory     Awareness: Anticipatory, Emergent            General Comments      Exercises Other Exercises Other Exercises: Recumbent sitting RUE overheaad reaching/gripping x10 Other Exercises: SAQ 1x10 bilat (VERY difficult to achieve Other Exercises: EOB sitting x20+ minutes; Other Exercises: encouragement of coughing, throat clearing, assistance with suction, etc Other Exercises: STS c huug transfers x6 with breaks between   Assessment/Plan    PT Assessment    PT Problem List         PT Treatment Interventions      PT Goals (Current goals can be found in the Care Plan section)  Acute Rehab PT Goals Patient Stated Goal: have pt regain independent AMB PT Goal Formulation: With family Time  For Goal Achievement: 02/14/22 Potential to Achieve Goals: Fair    Frequency 7X/week     Co-evaluation               AM-PAC PT "6 Clicks" Mobility  Outcome Measure Help needed turning from your back to your side while in a flat bed without using bedrails?: A Lot Help needed moving from lying on your back to sitting on the side of a flat bed without using bedrails?: A Lot Help needed moving to and from a bed to a chair (including a wheelchair)?: A Lot Help needed standing up from a chair using your arms (e.g., wheelchair or bedside chair)?: A Lot Help needed to walk in hospital room?: Total Help needed climbing 3-5 steps with a railing? : Total 6 Click Score: 10  End of Session Equipment Utilized During Treatment: Oxygen Activity Tolerance: Patient limited by lethargy;Patient limited by pain;Patient tolerated treatment well Patient left: in bed;with family/visitor present;with nursing/sitter in room Nurse Communication: Mobility status PT Visit Diagnosis: Muscle weakness (generalized) (M62.81);History of falling (Z91.81)    Time: 3295-1884 PT Time Calculation (min) (ACUTE ONLY): 42 min   Charges:   PT Evaluation $PT Re-evaluation: 1 Re-eval PT Treatments $Therapeutic Exercise: 8-22 mins $Neuromuscular Re-education: 8-22 mins       1:15 PM, 01/31/22 Etta Grandchild, PT, DPT Physical Therapist - [facility]  [phone] ([place])    Camille Thau C 01/31/2022, 1:06 PM

## 2022-01-31 NOTE — Plan of Care (Addendum)
Pt having low UOP during shift despite continuous IVF. Abdomen firm and distended. Bladder scan showed >935 ml. NP on call notified and ordered I+O cath. I+O cath performed and 1250 ml of urine drained from bladder.  Problem: Education: Goal: Knowledge of General Education information will improve Description: Including pain rating scale, medication(s)/side effects and non-pharmacologic comfort measures 01/31/2022 0331 by Katie Maus, RN Outcome: Not Progressing 01/31/2022 0331 by Katie Maus, RN Outcome: Progressing   Problem: Health Behavior/Discharge Planning: Goal: Ability to manage health-related needs will improve 01/31/2022 0331 by Katie Maus, RN Outcome: Not Progressing 01/31/2022 0331 by Katie Maus, RN Outcome: Progressing   Problem: Clinical Measurements: Goal: Ability to maintain clinical measurements within normal limits will improve 01/31/2022 0331 by Katie Maus, RN Outcome: Not Progressing 01/31/2022 0331 by Katie Maus, RN Outcome: Progressing Goal: Will remain free from infection 01/31/2022 0331 by Katie Maus, RN Outcome: Not Progressing 01/31/2022 0331 by Katie Maus, RN Outcome: Progressing Goal: Diagnostic test results will improve 01/31/2022 0331 by Katie Maus, RN Outcome: Not Progressing 01/31/2022 0331 by Katie Maus, RN Outcome: Progressing Goal: Respiratory complications will improve 01/31/2022 0331 by Katie Maus, RN Outcome: Not Progressing 01/31/2022 0331 by Katie Maus, RN Outcome: Progressing Goal: Cardiovascular complication will be avoided 01/31/2022 0331 by Katie Maus, RN Outcome: Not Progressing 01/31/2022 0331 by Katie Maus, RN Outcome: Progressing   Problem: Activity: Goal: Risk for activity intolerance will decrease 01/31/2022 0331 by Katie Maus, RN Outcome: Not Progressing 01/31/2022 0331 by Katie Maus, RN Outcome: Progressing   Problem: Nutrition: Goal: Adequate nutrition will be  maintained 01/31/2022 0331 by Katie Maus, RN Outcome: Not Progressing 01/31/2022 0331 by Katie Maus, RN Outcome: Progressing   Problem: Coping: Goal: Level of anxiety will decrease 01/31/2022 0331 by Katie Maus, RN Outcome: Not Progressing 01/31/2022 0331 by Katie Maus, RN Outcome: Progressing   Problem: Elimination: Goal: Will not experience complications related to bowel motility 01/31/2022 0331 by Katie Maus, RN Outcome: Not Progressing 01/31/2022 0331 by Katie Maus, RN Outcome: Progressing Goal: Will not experience complications related to urinary retention 01/31/2022 0331 by Katie Maus, RN Outcome: Not Progressing 01/31/2022 0331 by Katie Maus, RN Outcome: Progressing   Problem: Pain Managment: Goal: General experience of comfort will improve 01/31/2022 0331 by Katie Maus, RN Outcome: Not Progressing 01/31/2022 0331 by Katie Maus, RN Outcome: Progressing   Problem: Safety: Goal: Ability to remain free from injury will improve 01/31/2022 0331 by Katie Maus, RN Outcome: Not Progressing 01/31/2022 0331 by Katie Maus, RN Outcome: Progressing   Problem: Skin Integrity: Goal: Risk for impaired skin integrity will decrease 01/31/2022 0331 by Katie Maus, RN Outcome: Not Progressing 01/31/2022 0331 by Katie Maus, RN Outcome: Progressing   Problem: Education: Goal: Verbalization of understanding the information provided (i.e., activity precautions, restrictions, etc) will improve 01/31/2022 0331 by Katie Maus, RN Outcome: Not Progressing 01/31/2022 0331 by Katie Maus, RN Outcome: Progressing Goal: Individualized Educational Video(s) 01/31/2022 0331 by Katie Maus, RN Outcome: Not Progressing 01/31/2022 0331 by Katie Maus, RN Outcome: Progressing   Problem: Activity: Goal: Ability to ambulate and perform ADLs will improve 01/31/2022 0331 by Katie Maus, RN Outcome: Not Progressing 01/31/2022 0331 by Katie Maus, RN Outcome: Progressing   Problem: Self-Concept: Goal: Ability to maintain and perform role responsibilities to the fullest extent possible will improve 01/31/2022 0331 by Katie Maus, RN Outcome: Not Progressing 01/31/2022 0331 by Katie Maus, RN Outcome: Progressing   Problem: Pain Management: Goal: Pain level will decrease 01/31/2022 0331 by Katie Maus, RN Outcome: Not  Progressing 01/31/2022 0331 by Katie Maus, RN Outcome: Progressing

## 2022-01-31 NOTE — Consult Note (Signed)
NEUROLOGY CONSULTATION NOTE   Date of service: January 31, 2022 Patient Name: Katie Bell MRN:  [id number] DOB:  Oct 14, 1940 Reason for consult:  Requesting physician: Noah Delaine MD _ _ _   _ __   _ __ _ _  __ __   _ __   __ _  History of Present Illness   This is a 81 yo woman with hx melanoma 2016, HTN, depression admitted after mechanical fall causing a hip fracture now s/p operative repair. Mental status has been waxing and waning since admission. RRT called this AM for HTN and tachycardia. She had miosis and received narcan with some improvement. CT head after RRT showed new R parietal lobe hypodensity not seen on CT on admission (personal review). MRI brain confirmed bilateral frontoparietal infarcts as well as one in the right temporal lobe, also personally reviewed. Infarcts appear embolic and subacute.    ROS   UTA 2/2 mental status  Past History   I have reviewed the following:  Past Medical History:  Diagnosis Date   Anxiety    Cancer ([place]) 2015   basal cell removed from leg   Depression    Episodic mood disorder ([place])    mostly anxiety--depression in the past   Gastric ulcer    History   GERD (gastroesophageal reflux disease)    History of blood transfusion    At [facility] appro [facility] yrs ago   History of hiatal hernia    History of kidney stones 1960   Hypertension    Malignant melanoma ([place]) 07/14/2014   Left proximal post lat thigh. Breslow's 0.80m, Anatomic level II. Excised 08/03/2014   Osteoarthritis, hand    hand   Postmenopausal disorder    SVD (spontaneous vaginal delivery)    x 3   Past Surgical History:  Procedure Laterality Date   CATARACT EXTRACTION W/PHACO Right 02/12/2019   Procedure: CATARACT EXTRACTION PHACO AND INTRAOCULAR LENS PLACEMENT ([place];  Surgeon: HMarchia Meiers MD;  Location: [facility] ORS;  Service: Ophthalmology;  Laterality: Right;  [place]:34.2 CDE 17.97 Fluid Pack lot # [id number]   CATARACT EXTRACTION  W/PHACO Left 03/26/2019   Procedure: CATARACT EXTRACTION PHACO AND INTRAOCULAR LENS PLACEMENT ([place] LEFT VISION BLUE;  Surgeon: HMarchia Meiers MD;  Location: [facility] ORS;  Service: Ophthalmology;  Laterality: Left;  [place]01:16.2 CDE 11.70 FLUID PACK LOT # [id number]   CHOLECYSTECTOMY  2008   COLONOSCOPY  04/2011   CYSTOCELE REPAIR N/A 12/31/2013   Procedure: ANTERIOR REPAIR (CYSTOCELE);  Surgeon: TDonnamae Jude MD;  [facility];  Service: Gynecology;  Laterality: N/A;   CYSTOSCOPY W/ URETERAL STENT PLACEMENT Left 01/16/2016   Procedure: CYSTOSCOPY WITH STENT REPLACEMENT;  Surgeon: AHollice Espy MD;  Location: [facility] OR[facility];  Service: Urology;  Laterality: Left;   CYSTOSCOPY WITH STENT PLACEMENT Left 12/23/2015   Procedure: CYSTOSCOPY WITH STENT PLACEMENT;  Surgeon: BArdis Hughs MD;  Location: [facility];  Service: Urology;  Laterality: Left;   DIAGNOSTIC LAPAROSCOPY     EYE SURGERY     INTRAMEDULLARY (IM) NAIL INTERTROCHANTERIC Right 01/25/2022   Procedure: INTRAMEDULLARY (IM) NAIL INTERTROCHANTERIC;  Surgeon: CRenee Harder MD;  Location: [facility] ORS;  Service: Orthopedics;  Laterality: Right;   kidney stone removed     MELANOMA EXCISION  2015   left posterior thigh   TONSILLECTOMY  1944   TUBAL LIGATION     UPPER GI ENDOSCOPY     URETEROSCOPY WITH HOLMIUM LASER LITHOTRIPSY Left 01/16/2016   Procedure: URETEROSCOPY WITH HOLMIUM  LASER LITHOTRIPSY;  Surgeon: Hollice Espy, MD;  Location: [facility] OR[facility];  Service: Urology;  Laterality: Left;   WISDOM TOOTH EXTRACTION     Family History  Problem Relation Age of Onset   Alzheimer's disease Mother    Alcohol abuse Father    COPD Brother    Heart disease Neg Hx    Cancer Neg Hx    Social History   Socioeconomic History   Marital status: Divorced    Spouse name: Not on file   Number of children: 3   Years of education: Not on file   Highest education level: Not on file  Occupational History   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 Then son [name]   Would accept resuscitation attempts but no prolonged ventilation   No tube feeds if cognitively unaware      Social Determinants of Health   Financial Resource Strain: Not on file  Food Insecurity: Not on file  Transportation Needs: Not on file  Physical Activity: Not on file  Stress: Not on file  Social Connections: Not on file   Allergies  Allergen Reactions   Sulfamethoxazole-Trimethoprim Swelling    Angioedema and pruritis per 01/01/2017 documentation in care everywhere   Estradiol Other (See Comments)    Vaginal irritation Other reaction(s): Other (See Comments) Vaginal irritation   Morphine     agitation   Sertraline Hcl Other (See Comments)    Migraine headaches, severe   Morphine And Related Anxiety    Medications   Medications Prior to Admission  Medication Sig Dispense Refill Last Dose   amLODipine (NORVASC) 5 MG tablet TAKE 1 TABLET BY MOUTH ONCE DAILY 90 tablet 1 01/23/2022 at Unknown   Cholecalciferol (VITAMIN D) 400 UNITS capsule Take 800 Units by mouth daily.       potassium chloride SA (KLOR-CON M) 20 MEQ tablet TAKE 1 TABLET BY MOUTH ONCE DAILY 30 tablet 5 01/23/2022 at Unknown      Current Facility-Administered Medications:    acetaminophen (OFIRMEV) IV 1,000 mg, 1,000 mg, Intravenous, Q6H PRN, Ralene Muskrat B, MD, Stopped at 01/30/22 1900   acetaminophen (TYLENOL) suppository 650 mg, 650 mg, Rectal, Q4H PRN, Priscella Mann, Sudheer B, MD, 650 mg at 01/30/22 1440   acetaminophen (TYLENOL) tablet  650 mg, 650 mg, Oral, Q6H PRN, Renee Harder, MD, 650 mg at 01/29/22 2300   aspirin EC tablet 81 mg, 81 mg, Oral, BID, Renee Harder, MD, 81 mg at 01/29/22 2134   cefTRIAXone (ROCEPHIN) 2 g in sodium chloride 0.9 % 100 mL IVPB, 2 g, Intravenous, Q24H, Sreenath, Sudheer B, MD, Stopped at 01/30/22 1900   clopidogrel (PLAVIX) tablet 75 mg, 75 mg, Oral, Daily, Sreenath, Sudheer B, MD   dextrose 5 % and 0.45 % NaCl with KCl 40 mEq/L infusion, , Intravenous, Continuous, Sreenath, Sudheer B, MD, Last Rate: 100 mL/hr at 01/31/22 0219, New Bag at 01/31/22 0219   docusate sodium (COLACE) capsule 100 mg,  100 mg, Oral, BID, Renee Harder, MD, 100 mg at 01/29/22 1138   feeding supplement (ENSURE ENLIVE / ENSURE PLUS) liquid 237 mL, 237 mL, Oral, BID BM, Sreenath, Sudheer B, MD, 237 mL at 01/28/22 1420   hydrALAZINE (APRESOLINE) injection 5 mg, 5 mg, Intravenous, Q4H PRN, Priscella Mann, Sudheer B, MD   HYDROmorphone (DILAUDID) injection 0.5 mg, 0.5 mg, Intravenous, Q3H PRN, Renee Harder, MD, 0.5 mg at 01/30/22 0545   menthol-cetylpyridinium (CEPACOL) lozenge 3 mg, 1 lozenge, Oral, PRN **OR** phenol (CHLORASEPTIC) mouth spray 1 spray, 1 spray, Mouth/Throat, PRN, Renee Harder, MD   metoCLOPramide (REGLAN) tablet 5-10 mg, 5-10 mg, Oral, Q8H PRN **OR** metoCLOPramide (REGLAN) injection 5-10 mg, 5-10 mg, Intravenous, Q8H PRN, Renee Harder, MD   multivitamin with minerals tablet 1 tablet, 1 tablet, Oral, Daily, Sreenath, Sudheer B, MD, 1 tablet at 01/29/22 1138   naloxone (NARCAN) injection 0.4 mg, 0.4 mg, Intravenous, PRN, Priscella Mann, Sudheer B, MD, 0.4 mg at 01/30/22 0731   ondansetron (ZOFRAN) tablet 4 mg, 4 mg, Oral, Q6H PRN **OR** ondansetron (ZOFRAN) injection 4 mg, 4 mg, Intravenous, Q6H PRN, Renee Harder, MD   oxyCODONE (Oxy IR/ROXICODONE) immediate release tablet 5 mg, 5 mg, Oral, Q4H PRN, Renee Harder, MD, 5 mg at 01/29/22 1138   pantoprazole (PROTONIX) EC tablet 40 mg, 40 mg,  Oral, Daily, Sreenath, Sudheer B, MD, 40 mg at 01/29/22 1138   polyethylene glycol (MIRALAX / GLYCOLAX) packet 17 g, 17 g, Oral, Daily, Sreenath, Sudheer B, MD   vancomycin variable dose per unstable renal function (pharmacist dosing), , Does not apply, See admin instructions, Benita Gutter, RPH  Vitals   Vitals:   01/30/22 2124 01/30/22 2331 01/31/22 0041 01/31/22 0315  BP: (!) 111/52 (!) 113/44 111/61 115/64  Pulse: 100 (!) 101 98 95  Resp:  18 (!) 22 20  Temp: (!) 97.5 F (36.4 C) 98 F (36.7 C)  98 F (36.7 C)  TempSrc: Axillary     SpO2: 100% 99%  99%  Weight:      Height:         Body mass index is 21.48 kg/m.  Physical Exam   Physical Exam Gen: alert but unable to answer orientation questions 2/2 no intelligible speech Resp: CTAB, no w/r/r CV: RRR, no m/g/r; nml S1 and S2. 2+ symmetric peripheral pulses.  Neuro: *MS: alert but unable to answer orientation questions 2/2 no intelligible speech, does not follow commands *Speech: severely dysarthric, unable to name or repeat *CN: PERRLA, blinks to threat bilat, tracks examiner, sensation intact, face symmetric at rest, hearing intact to voice *Motor & sensory: withdraws to noxious stimuli x4 extrem *Coordination:  UTA *Reflexes:  2+ and symmetric throughout without clonus; toes down-going bilat *Gait: deferred  NIHSS  1a Level of Conscious.: 0 1b LOC Questions: 2 1c LOC Commands: 2 2 Best Gaze: 0 3 Visual: 0 4 Facial Palsy: 0 5a Motor Arm - left: 3 5b Motor Arm - Right: 3 6a Motor Leg - Left: 3 6b Motor Leg - Right: 3 7 Limb Ataxia: 0 8 Sensory: 0 9 Best Language: 2 10 Dysarthria: 3 11 Extinct. and Inatten.: 0  TOTAL: 21   Premorbid mRS = 4   Labs   CBC:  Recent Labs  Lab 01/28/22 0740 01/29/22 0936 01/30/22 0637  WBC 15.8* 16.5* 12.4*  NEUTROABS 13.8* 13.8*  --   HGB 10.5* 9.9* 9.7*  HCT 30.6* 28.8* 28.5*  MCV 92.7 92.0 94.7  PLT 136* 145* 105*    Basic  Metabolic Panel:  Lab  Results  Component Value Date   NA 138 01/30/2022   K 3.5 01/30/2022   CO2 18 (L) 01/30/2022   GLUCOSE 131 (H) 01/30/2022   BUN 49 (H) 01/30/2022   CREATININE 2.11 (H) 01/30/2022   CALCIUM 8.4 (L) 01/30/2022   GFRNONAA 23 (L) 01/30/2022   GFRAA >60 12/20/2019   Lipid Panel:  Lab Results  Component Value Date   LDLCALC 125 (H) 10/17/2016   HgbA1c: No results found for: "HGBA1C" Urine Drug Screen: No results found for: "LABOPIA", "COCAINSCRNUR", "LABBENZ", "AMPHETMU", "THCU", "LABBARB"  Alcohol Level No results found for: "ETH"   Impression   This is a 81 yo woman with hx melanoma 2016, HTN, depression admitted after mechanical fall causing a hip fracture now s/p operative repair on whom neurology is consulted for multifocal subacute embolic infarcts.  Recommendations   - Permissive HTN x48 hrs goal BP <220/110. PRN labetalol or hydralazine if BP above these parameters. Avoid oral antihypertensives. - MRA H&N - TTE w/ bubble - Check A1c and LDL + add statin per guidelines - ASA '81mg'$  daily + plavix '75mg'$  daily x21 days f/b ASA '81mg'$  daily monotherapy after that - q4 hr neuro checks - STAT head CT for any change in neuro exam - Tele - PT/OT/SLP - Stroke education - Amb referral to neurology upon discharge  - Will continue to follow  ______________________________________________________________________   Thank you for the opportunity to take part in the care of this patient. If you have any further questions, please contact the neurology consultation attending.  Signed,  Su Monks, MD [facility] [phone]  If 7pm- 7am, please page neurology on call as listed in [place].  **Any copied and pasted documentation in this note was written by me in another application not billed for and pasted by me into this document.

## 2022-01-31 NOTE — Consult Note (Signed)
NAME: Katie Bell  DOB: 06-10-1940  MRN: [id number]  Date/Time: 01/31/2022 2:59 PM  REQUESTING PROVIDERDr.Ayiku Subjective:  REASON FOR CONSULT: klebsiella bacteremia ? Katie Bell is a 81 y.o. female with a history of HTN, Anxiety disorder, GERD, renal stone presented to ED after a fall at home on 01/24/22 She had rt intertrochanteric fracture. CT head was normal On 01/25/22 underwent Rt intramedullary nail placement- had foley during surgery which was removed the next morning 01/26/22 Has had some confusion which has been intermittent since then. On 1010/23 Am a RRR was called . Her BP was 203/186>163/103, HR 143 and temp 99 RR 41 Work up showed lactate of 3.4,CT head done on 10/10 revealed a new rt parietal lobe infarct MRI showed b/l frontoparietal diffusion restriction and rt temporal lobe area consistent with subacute infarcts  Fever of 102.1 on 01/30/22  afternoon and blood culture sent- and started on vanco and ceftriaxone came back as Klebsiella and I am seeing the patient fro the same Today the WBC count is 52  Past Medical History:  Diagnosis Date   Anxiety    Cancer ([place]) 2015   basal cell removed from leg   Depression    Episodic mood disorder ([place])    mostly anxiety--depression in the past   Gastric ulcer    History   GERD (gastroesophageal reflux disease)    History of blood transfusion    At [facility] appro 15 yrs ago   History of hiatal hernia    History of kidney stones 1960   Hypertension    Malignant melanoma ([place]) 07/14/2014   Left proximal post lat thigh. Breslow's 0.71m, Anatomic level II. Excised 08/03/2014   Osteoarthritis, hand    hand   Postmenopausal disorder    SVD (spontaneous vaginal delivery)    x 3    Past Surgical History:  Procedure Laterality Date   CATARACT EXTRACTION W/PHACO Right 02/12/2019   Procedure: CATARACT EXTRACTION PHACO AND INTRAOCULAR LENS PLACEMENT ([place];  Surgeon: HMarchia Meiers MD;  Location: [facility]  ORS;  Service: Ophthalmology;  Laterality: Right;  [place]:34.2 CDE 17.97 Fluid Pack lot # [id number]   CATARACT EXTRACTION W/PHACO Left 03/26/2019   Procedure: CATARACT EXTRACTION PHACO AND [place]TRAOCULAR LENS PLACEMENT ([place] VISION BLUE;  Surgeon: HMarchia Meiers MD;  Location: [facility] ORS;  Service: Ophthalmology;  Laterality: Left;  [place]01:16.2 CDE 11.70 FLUID PACK LOT # [id number]   CHOLECYSTECTOMY  2008   COLONOSCOPY  04/2011   CYSTOCELE REPAIR N/A 12/31/2013   Procedure: ANTERIOR REPAIR (CYSTOCELE);  Surgeon: TDonnamae Jude MD;  [place];  Service: Gynecology;  Laterality: N/A;   CYSTOSCOPY W/ URETERAL STENT PLACEMENT Left 01/16/2016   Procedure: CYSTOSCOPY WITH STENT REPLACEMENT;  Surgeon: AHollice Espy MD;  Location: [facility] ORS;  Service: Urology;  Laterality: Left;   CYSTOSCOPY WITH STENT PLACEMENT Left 12/23/2015   Procedure: CYSTOSCOPY WITH STENT PLACEMENT;  Surgeon: BArdis Hughs MD;  Location: [facility] ORS;  Service: Urology;  Laterality: Left;   DIAGNOSTIC LAPAROSCOPY     EYE SURGERY     INTRAMEDULLARY (IM) NAIL INTERTROCHANTERIC Right 01/25/2022   Procedure: INTRAMEDULLARY (IM) NAIL INTERTROCHANTERIC;  Surgeon: CRenee Harder MD;  Location: [facility] ORS;  Service: Orthopedics;  Laterality: Right;   kidney stone removed     MELANOMA EXCISION  2015   left posterior thigh   TONSILLECTOMY  1944   TUBAL LIGATION     UPPER GI ENDOSCOPY     URETEROSCOPY WITH HOLMIUM LASER LITHOTRIPSY Left  01/16/2016   Procedure: URETEROSCOPY WITH HOLMIUM LASER LITHOTRIPSY;  Surgeon: Hollice Espy, MD;  Location: [facility] ORS;  Service: Urology;  Laterality: Left;   WISDOM TOOTH EXTRACTION      Social History   Socioeconomic History   Marital status: Divorced    Spouse name: Not on file   Number of children: 3   Years of education: Not on file   Highest education level: Not on file  Occupational History   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 Then son [name]   Would accept resuscitation attempts but no prolonged ventilation   No tube feeds if cognitively unaware      Social Determinants of Health   Financial Resource Strain: Not on file  Food Insecurity: Not on file  Transportation Needs: Not on file  Physical Activity: Not on file  Stress: Not on file  Social Connections: Not on file  Intimate Partner Violence: Not on file    Family History  Problem Relation Age of Onset   Alzheimer's disease Mother    Alcohol abuse Father    COPD Brother    Heart disease Neg Hx    Cancer Neg Hx    Allergies  Allergen Reactions   Sulfamethoxazole-Trimethoprim Swelling    Angioedema and pruritis per 01/01/2017 documentation in care everywhere   Estradiol Other (See Comments)    Vaginal irritation Other reaction(s): Other (See Comments) Vaginal irritation   Morphine     agitation   Sertraline Hcl Other (See Comments)    Migraine headaches, severe   Morphine And Related Anxiety   I? Current Facility-Administered Medications  Medication Dose Route Frequency Provider Last Rate Last Admin   acetaminophen (OFIRMEV) IV 1,000 mg  1,000 mg Intravenous Q6H PRN Ralene Muskrat B, MD   Stopped at 01/30/22 1900   acetaminophen (TYLENOL) suppository 650 mg  650 mg Rectal Q4H PRN Ralene Muskrat B, MD   650 mg at 01/30/22 1440   acetaminophen (TYLENOL) tablet 650 mg  650 mg Oral Q6H PRN Renee Harder, MD   650 mg at 01/29/22 2300   aspirin EC tablet 81 mg  81 mg Oral BID Renee Harder, MD   81 mg at 01/29/22 2134    cefTRIAXone (ROCEPHIN) 2 g in sodium chloride 0.9 % 100 mL IVPB  2 g Intravenous Q24H Ralene Muskrat B, MD   Stopped at 01/30/22 1900   clopidogrel (PLAVIX) tablet 75 mg  75 mg Oral Daily Sreenath, Sudheer B, MD       dextrose 5 % and 0.45 % NaCl with KCl 40 mEq/L infusion   Intravenous Continuous Ralene Muskrat B, MD 100 mL/hr at 01/31/22 0219 New Bag at 01/31/22 0219   docusate sodium (COLACE) capsule 100 mg  100 mg Oral BID Renee Harder, MD   100 mg at 01/29/22 1138   feeding supplement (ENSURE ENLIVE / ENSURE PLUS) liquid 237 mL  237 mL Oral BID BM Sreenath, Sudheer B,  MD   237 mL at 01/28/22 1420   hydrALAZINE (APRESOLINE) injection 5 mg  5 mg Intravenous Q4H PRN Ralene Muskrat B, MD       HYDROmorphone (DILAUDID) injection 0.5 mg  0.5 mg Intravenous Q3H PRN Renee Harder, MD   0.5 mg at 01/30/22 0545   menthol-cetylpyridinium (CEPACOL) lozenge 3 mg  1 lozenge Oral PRN Renee Harder, MD       Or   phenol (CHLORASEPTIC) mouth spray 1 spray  1 spray Mouth/Throat PRN Renee Harder, MD       metoCLOPramide (REGLAN) tablet 5-10 mg  5-10 mg Oral Q8H PRN Renee Harder, MD       Or   metoCLOPramide (REGLAN) injection 5-10 mg  5-10 mg Intravenous Q8H PRN Renee Harder, MD       multivitamin with minerals tablet 1 tablet  1 tablet Oral Daily Priscella Mann, Sudheer B, MD   1 tablet at 01/29/22 1138   naloxone (NARCAN) injection 0.4 mg  0.4 mg Intravenous PRN Ralene Muskrat B, MD   0.4 mg at 01/30/22 0731   ondansetron (ZOFRAN) tablet 4 mg  4 mg Oral Q6H PRN Renee Harder, MD       Or   ondansetron [facility]) injection 4 mg  4 mg Intravenous Q6H PRN Renee Harder, MD       oxyCODONE (Oxy IR/ROXICODONE) immediate release tablet 5 mg  5 mg Oral Q4H PRN Renee Harder, MD   5 mg at 01/29/22 1138   pantoprazole (PROTONIX) EC tablet 40 mg  40 mg Oral Daily Ralene Muskrat B, MD   40 mg at 01/29/22 1138   polyethylene glycol (MIRALAX / GLYCOLAX) packet 17 g  17 g  Oral Daily Sreenath, Sudheer B, MD         Abtx:  Anti-infectives (From admission, onward)    Start     Dose/Rate Route Frequency Ordered Stop   01/30/22 1800  vancomycin (VANCOCIN) IVPB 1000 mg/200 mL premix        1,000 mg 200 mL/hr over 60 Minutes Intravenous  Once 01/30/22 1713 01/30/22 1945   01/30/22 1713  vancomycin variable dose per unstable renal function (pharmacist dosing)  Status:  Discontinued         Does not apply See admin instructions 01/30/22 1713 01/31/22 1339   01/30/22 1600  cefTRIAXone (ROCEPHIN) 2 g in sodium chloride 0.9 % 100 mL IVPB        2 g 200 mL/hr over 30 Minutes Intravenous Every 24 hours 01/30/22 1501     01/25/22 2200  ceFAZolin (ANCEF) IVPB 2g/100 mL premix        2 g 200 mL/hr over 30 Minutes Intravenous Every 6 hours 01/25/22 2031 01/26/22 0510   01/25/22 1700  gentamicin 80 mg in 0.9% sodium chloride 250 mL irrigation  Status:  Discontinued          As needed 01/25/22 1700 01/25/22 1712   01/25/22 1350  ceFAZolin (ANCEF) 1-4 GM/50ML-% IVPB       Note to Pharmacy: Herby Abraham W: cabinet override      01/25/22 1350 01/25/22 1624   01/25/22 0800  ceFAZolin (ANCEF) IVPB 1 g/50 mL premix        1 g 100 mL/hr over 30 Minutes Intravenous To Surgery 01/24/22 2112 01/25/22 1645       REVIEW OF SYSTEMS:  Difficult to get review of systems because of lethargydysartriai, weakness: Objective:  VITALS:  BP (!) 117/58 (BP Location: Right Arm)   Pulse 89  Temp 98 F (36.7 C)   Resp 20   Ht 5' (1.524 m)   Wt 49.9 kg   SpO2 100%   BMI 21.48 kg/m   PHYSICAL EXAM:  General: lethargic, but easily arousable and verbally responds to questions appropriately most of the time- dysarthria, some confusion Head: Normocephalic, without obvious abnormality, atraumatic. Eyes: Conjunctivae clear, anicteric sclerae. Pupils are equal ENT Nares normal. No drainage or sinus tenderness. Lips, mucosa, and tongue dry. No Thrush Neck:  symmetrical, no adenopathy,  thyroid: non tender no carotid bruit and no JVD. Back: No CVA tenderness. Lungs: b/l air entry Heart: s1s2 Abdomen: Soft, non-tender,not distended. Bowel sounds normal. No masses Extremities: rt hip area-around surgical site  ecchymosis-  Also inner thigh rt      Skin: as above Lymph: Cervical, supraclavicular normal. Neurologic: dysarthria Expressive aphasia Pertinent Labs Lab Results CBC    Component Value Date/Time   WBC 52.3 (HH) 01/31/2022 0541   RBC 2.76 (L) 01/31/2022 0541   HGB 8.7 (L) 01/31/2022 0541   HCT 26.0 (L) 01/31/2022 0541   PLT 96 (L) 01/31/2022 0541   MCV 94.2 01/31/2022 0541   MCH 31.5 01/31/2022 0541   MCHC 33.5 01/31/2022 0541   RDW 12.8 01/31/2022 0541   LYMPHSABS 1.2 01/31/2022 0541   MONOABS 1.2 (H) 01/31/2022 0541   EOSABS 0.0 01/31/2022 0541   BASOSABS 0.0 01/31/2022 0541       Latest Ref Rng & Units 01/31/2022    5:41 AM 01/30/2022    6:37 AM 01/29/2022    9:04 AM  CMP  Glucose 70 - 99 mg/dL 145  131  168   BUN 8 - 23 mg/dL 54  49  45   Creatinine 0.44 - 1.00 mg/dL 2.14  2.11  1.60   Sodium 135 - 145 mmol/L 141  138  141   Potassium 3.5 - 5.1 mmol/L 3.9  3.5  3.6   Chloride 98 - 111 mmol/L 114  111  113   CO2 22 - 32 mmol/L '18  18  21   '$ Calcium 8.9 - 10.3 mg/dL 8.0  8.4  9.1       Microbiology: Recent Results (from the past 240 hour(s))  Culture, blood (Routine X 2) w Reflex to ID Panel     Status: None (Preliminary result)   Collection Time: 01/30/22  3:04 PM   Specimen: BLOOD  Result Value Ref Range Status   Specimen Description   Final    BLOOD RIGHT ANTECUBITAL Performed at [facility], [address] [place], [place] [address]    Special Requests   Final    BOTTLES DRAWN AEROBIC AND ANAEROBIC Blood Culture adequate volume Performed at [facility], [address] [place], [place] [address]    Culture  Setup Time   Final    GRAM NEGATIVE RODS IN BOTH AEROBIC AND ANAEROBIC BOTTLES CRITICAL  RESULT CALLED TO, READ BACK BY AND VERIFIED WITH: BRANDON BEERS @ 6314 01/31/22 LFD CRITICAL VALUE NOTED.  VALUE IS CONSISTENT WITH PREVIOUSLY REPORTED AND CALLED VALUE. Performed at [facility], [place] [address], [place], [place] [address]    Culture GRAM NEGATIVE RODS  Final   Report Status PENDING  Incomplete  Culture, blood (Routine X 2) w Reflex to ID Panel     Status: None (Preliminary result)   Collection Time: 01/30/22  3:04 PM   Specimen: BLOOD  Result Value Ref Range Status   Specimen Description BLOOD LEFT ANTECUBITAL  Final  Special Requests   Final    BOTTLES DRAWN AEROBIC AND ANAEROBIC Blood Culture adequate volume   Culture  Setup Time   Final    GRAM NEGATIVE RODS IN BOTH AEROBIC AND ANAEROBIC BOTTLES Organism ID to follow CRITICAL RESULT CALLED TO, READ BACK BY AND VERIFIED WITH: BRANDON BEERS @ 6606 01/31/22 LFD Performed at [facility], [address] [place], [place] [address]    Culture GRAM NEGATIVE RODS  Final   Report Status PENDING  Incomplete  Blood Culture ID Panel (Reflexed)     Status: Abnormal   Collection Time: 01/30/22  3:04 PM  Result Value Ref Range Status   Enterococcus faecalis NOT DETECTED NOT DETECTED Final   Enterococcus Faecium NOT DETECTED NOT DETECTED Final   Listeria monocytogenes NOT DETECTED NOT DETECTED Final   Staphylococcus species NOT DETECTED NOT DETECTED Final   Staphylococcus aureus (BCID) NOT DETECTED NOT DETECTED Final   Staphylococcus epidermidis NOT DETECTED NOT DETECTED Final   Staphylococcus lugdunensis NOT DETECTED NOT DETECTED Final   Streptococcus species NOT DETECTED NOT DETECTED Final   Streptococcus agalactiae NOT DETECTED NOT DETECTED Final   Streptococcus pneumoniae NOT DETECTED NOT DETECTED Final   Streptococcus pyogenes NOT DETECTED NOT DETECTED Final   A.calcoaceticus-baumannii NOT DETECTED NOT DETECTED Final   Bacteroides fragilis NOT DETECTED NOT DETECTED Final   Enterobacterales DETECTED  (A) NOT DETECTED Final    Comment: Enterobacterales represent a large order of gram negative bacteria, not a single organism. CRITICAL RESULT CALLED TO, READ BACK BY AND VERIFIED WITH: BRANDON BEERS @ 1093 01/31/22 LFD    Enterobacter cloacae complex NOT DETECTED NOT DETECTED Final   Escherichia coli NOT DETECTED NOT DETECTED Final   Klebsiella aerogenes NOT DETECTED NOT DETECTED Final   Klebsiella oxytoca NOT DETECTED NOT DETECTED Final   Klebsiella pneumoniae DETECTED (A) NOT DETECTED Final    Comment: CRITICAL RESULT CALLED TO, READ BACK BY AND VERIFIED WITH: BRANDON BEERS @ 2355 01/31/22 LFD    Proteus species NOT DETECTED NOT DETECTED Final   Salmonella species NOT DETECTED NOT DETECTED Final   Serratia marcescens NOT DETECTED NOT DETECTED Final   Haemophilus influenzae NOT DETECTED NOT DETECTED Final   Neisseria meningitidis NOT DETECTED NOT DETECTED Final   Pseudomonas aeruginosa NOT DETECTED NOT DETECTED Final   Stenotrophomonas maltophilia NOT DETECTED NOT DETECTED Final   Candida albicans NOT DETECTED NOT DETECTED Final   Candida auris NOT DETECTED NOT DETECTED Final   Candida glabrata NOT DETECTED NOT DETECTED Final   Candida krusei NOT DETECTED NOT DETECTED Final   Candida parapsilosis NOT DETECTED NOT DETECTED Final   Candida tropicalis NOT DETECTED NOT DETECTED Final   Cryptococcus neoformans/gattii NOT DETECTED NOT DETECTED Final   CTX-M ESBL NOT DETECTED NOT DETECTED Final   Carbapenem resistance IMP NOT DETECTED NOT DETECTED Final   Carbapenem resistance KPC NOT DETECTED NOT DETECTED Final   Carbapenem resistance NDM NOT DETECTED NOT DETECTED Final   Carbapenem resist OXA 48 LIKE NOT DETECTED NOT DETECTED Final   Carbapenem resistance VIM NOT DETECTED NOT DETECTED Final    Comment: Performed at [facility], [address] [place], [place] [address]  MRSA Next Gen by PCR, Nasal     Status: None   Collection Time: 01/30/22  5:26 PM   Specimen: Nasal  Mucosa; Nasal Swab  Result Value Ref Range Status   MRSA by PCR Next Gen NOT DETECTED NOT DETECTED Final    Comment: (NOTE) The GeneXpert MRSA Assay (FDA approved  for NASAL specimens only), is one component of a comprehensive MRSA colonization surveillance program. It is not intended to diagnose MRSA infection nor to guide or monitor treatment for MRSA infections. Test performance is not FDA approved in patients less than 22 years old. Performed at [facility], [address] [place], [place] [address]     IMAGING RESULTS: CXR  I have personally reviewed the films No infiltrate   ?MRI- rt temporal lobe subacute infarct B/l frontoparietal regions have restricted diffusion  Impression/Recommendation ?Klebsiella bacteremia -likely Hospital acquired- no blood culture was done on admission Source- unclear- r/o UTI- foley was there for a few hours for surgery and was removed Surgical site less likely the source Continue Ceftriaxone  Fall- with fracture rt femur - s/p intertrochanteric nailing CT head on admission did not show any stroke   CVA- somnolent was it there on admission and she fell because of it , or did she develop CVA yesterday She will need Y+TEE to r/o embolic source from valve /endocarditis  Severe leucocytosis- leukemoid reaction Could it be from ecchymosis, b/l popliteal vein DVT or other causes  Anemia  ? ? ___________________________________________________ Discussed with care team Note:  This document was prepared using Dragon voice recognition software and may include unintentional dictation errors.

## 2022-01-31 NOTE — Consult Note (Signed)
PHARMACY - PHYSICIAN COMMUNICATION CRITICAL VALUE ALERT - BLOOD CULTURE IDENTIFICATION (BCID)  DEVRI KREHER is a 81 y.o. female admitted on 01/24/2022 with acute CVA, AKI, right intertrochanteric hip fracture s/p IM nailing on 10/5. Patient further with tachycardia, tachypnea, and hypoxia which acutely worsened 10/10 AM requiring supplemental oxygen. Persistent leukocytosis though slightly improved, lactate elevated, febrile. CXR no acute abnormalities. D-dimer markedly elevated but VQ scan with low probability PE. Pharmacy has been consulted for vancomycin dosing. Patient is also ordered ceftriaxone.   Assessment:  4of4 vials with klebsiella pneumoniae (no resistances detected)  Name of physician (or Provider) Contacted: Neomia Glass NP  Current antibiotics: Rocephin 2g IV q24h & vancomycin (renal Var. Dosing)  Changes to prescribed antibiotics recommended:  Continue with current culture appropriate for rocephin pending full susceptibilities   Historic susceptibilities:   Results for orders placed or performed during the hospital encounter of 01/24/22  Blood Culture ID Panel (Reflexed) (Collected: 01/30/2022  3:04 PM)  Result Value Ref Range   Enterococcus faecalis NOT DETECTED NOT DETECTED   Enterococcus Faecium NOT DETECTED NOT DETECTED   Listeria monocytogenes NOT DETECTED NOT DETECTED   Staphylococcus species NOT DETECTED NOT DETECTED   Staphylococcus aureus (BCID) NOT DETECTED NOT DETECTED   Staphylococcus epidermidis NOT DETECTED NOT DETECTED   Staphylococcus lugdunensis NOT DETECTED NOT DETECTED   Streptococcus species NOT DETECTED NOT DETECTED   Streptococcus agalactiae NOT DETECTED NOT DETECTED   Streptococcus pneumoniae NOT DETECTED NOT DETECTED   Streptococcus pyogenes NOT DETECTED NOT DETECTED   A.calcoaceticus-baumannii NOT DETECTED NOT DETECTED   Bacteroides fragilis NOT DETECTED NOT DETECTED   Enterobacterales DETECTED (A) NOT DETECTED   Enterobacter cloacae  complex NOT DETECTED NOT DETECTED   Escherichia coli NOT DETECTED NOT DETECTED   Klebsiella aerogenes NOT DETECTED NOT DETECTED   Klebsiella oxytoca NOT DETECTED NOT DETECTED   Klebsiella pneumoniae DETECTED (A) NOT DETECTED   Proteus species NOT DETECTED NOT DETECTED   Salmonella species NOT DETECTED NOT DETECTED   Serratia marcescens NOT DETECTED NOT DETECTED   Haemophilus influenzae NOT DETECTED NOT DETECTED   Neisseria meningitidis NOT DETECTED NOT DETECTED   Pseudomonas aeruginosa NOT DETECTED NOT DETECTED   Stenotrophomonas maltophilia NOT DETECTED NOT DETECTED   Candida albicans NOT DETECTED NOT DETECTED   Candida auris NOT DETECTED NOT DETECTED   Candida glabrata NOT DETECTED NOT DETECTED   Candida krusei NOT DETECTED NOT DETECTED   Candida parapsilosis NOT DETECTED NOT DETECTED   Candida tropicalis NOT DETECTED NOT DETECTED   Cryptococcus neoformans/gattii NOT DETECTED NOT DETECTED   CTX-M ESBL NOT DETECTED NOT DETECTED   Carbapenem resistance IMP NOT DETECTED NOT DETECTED   Carbapenem resistance KPC NOT DETECTED NOT DETECTED   Carbapenem resistance NDM NOT DETECTED NOT DETECTED   Carbapenem resist OXA 48 LIKE NOT DETECTED NOT DETECTED   Carbapenem resistance VIM NOT DETECTED NOT DETECTED    Shanon Brow Gaylan Fauver 01/31/2022  3:39 AM

## 2022-01-31 NOTE — Progress Notes (Addendum)
Progress Note    Katie Bell  QQI:[id number] DOB: 1940-06-10  DOA: 01/24/2022 PCP: Venia Carbon, MD      Brief Narrative:    Medical records reviewed and are as summarized below:  Katie Bell is a 81 y.o. female medical history significant for hypertension, anxiety, history of basal cell carcinoma, GERD, kidney stones, who presented to the hospital after sustaining a mechanical fall at home.  She was found to have right hip fracture.        Assessment/Plan:   Principal Problem:   Closed right femoral fracture (HCC) Active Problems:   Hypertension   Severe sepsis ([place])   Leucocytosis   Fall at home, initial encounter   Bacteremia due to Klebsiella pneumoniae   CVA (cerebral vascular accident) [facility])   Nutrition Problem: Inadequate oral intake Etiology: poor appetite  Signs/Symptoms: meal completion < 25%   Body mass index is 21.48 kg/m.   Acute embolic stroke:  Use aspirin and Plavix as able.  Aspirin can be  given rectally but okay to hold Plavix if she is unable to swallow.  Case discussed with Dr. Quinn Axe, neurologist.  2D echo is pending.  Dysphagia: Nursing staff suctioned copious secretions from her mouth.  Keep n.p.o. for now.  Speech therapist has been consulted for swallow evaluation.  Acute lower extremity (bilateral calf) DVT: 1 right peroneal vein and left posterior tibial vein DVT.  No anticoagulation for now because of increased risk for hemorrhagic conversion of embolic infarcts in the brain.  Discussed with Dr. Quinn Axe, neurologist.  Severe sepsis secondary to Klebsiella pneumonia bacteremia, severe leukocytosis, recent fever (Tmax 102.1 F) on 01/30/2022: WBC up to 52.3.  Continue IV ceftriaxone.  Case discussed with Dr. Delaine Lame, [place] specialist, to assist with management.  Urine culture is pending.  Lactic acid down from 3.4-2.8.  Procalcitonin 5.36.  Acute hypoxic respiratory failure: Continue 2 L/min oxygen via nasal  cannula  AKI: Creatinine is trending upward.  Continue IV fluids for hydration.  Right hip fracture: S/p right intramedullary nail intertrochanteric on 01/25/2022.  Acute blood loss anemia: Monitor H&H and transfuse as needed.    Diet Order             Diet NPO time specified  Diet effective now                            Consultants: Neurologist ID specialist Orthopedic surgeon  Procedures: Right intramedullary nail intertrochanteric for right hip fracture on 01/25/2022    Medications:    aspirin  150 mg Rectal Daily   clopidogrel  75 mg Oral Daily   docusate sodium  100 mg Oral BID   feeding supplement  237 mL Oral BID BM   multivitamin with minerals  1 tablet Oral Daily   pantoprazole  40 mg Oral Daily   polyethylene glycol  17 g Oral Daily   Continuous Infusions:  cefTRIAXone (ROCEPHIN)  IV Stopped (01/30/22 1900)   dextrose 5 % and 0.45 % NaCl with KCl 40 mEq/L 100 mL/hr at 01/31/22 0219     Anti-infectives (From admission, onward)    Start     Dose/Rate Route Frequency Ordered Stop   01/30/22 1800  vancomycin (VANCOCIN) IVPB 1000 mg/200 mL premix        1,000 mg 200 mL/hr over 60 Minutes Intravenous  Once 01/30/22 1713 01/30/22 1945   01/30/22 1713  vancomycin variable dose per unstable renal function (  pharmacist dosing)  Status:  Discontinued         Does not apply See admin instructions 01/30/22 1713 01/31/22 1339   01/30/22 1600  cefTRIAXone (ROCEPHIN) 2 g in sodium chloride 0.9 % 100 mL IVPB        2 g 200 mL/hr over 30 Minutes Intravenous Every 24 hours 01/30/22 1501     01/25/22 2200  ceFAZolin (ANCEF) IVPB 2g/100 mL premix        2 g 200 mL/hr over 30 Minutes Intravenous Every 6 hours 01/25/22 2031 01/26/22 0510   01/25/22 1700  gentamicin 80 mg in 0.9% sodium chloride 250 mL irrigation  Status:  Discontinued          As needed 01/25/22 1700 01/25/22 1712   01/25/22 1350  ceFAZolin (ANCEF) 1-4 GM/50ML-% IVPB       Note to Pharmacy:  Herby Abraham W: cabinet override      01/25/22 1350 01/25/22 1624   01/25/22 0800  ceFAZolin (ANCEF) IVPB 1 g/50 mL premix        1 g 100 mL/hr over 30 Minutes Intravenous To Surgery 01/24/22 2112 01/25/22 1645              Family Communication/Anticipated D/C date and plan/Code Status   DVT prophylaxis: SCDs Start: 01/25/22 1708     Code Status: Full Code  Family Communication: Plan discussed with [name], son, over the phone (on 2 different occasions today) Disposition Plan: Plan to discharge to SNF when medically stable   Status is: Inpatient Remains inpatient appropriate because: Acute stroke, Klebsiella bacteremia       Subjective:   Interval events noted.  She is confused unable to provide any history.  Speech is slurred and difficult to understand.  Cheral Bay, PT, was at the bedside.  Objective:    Vitals:   01/30/22 2331 01/31/22 0041 01/31/22 0315 01/31/22 0554  BP: (!) 113/44 111/61 115/64 (!) 117/58  Pulse: (!) 101 98 95 89  Resp: 18 (!) '22 20 20  '$ Temp: 98 F (36.7 C)  98 F (36.7 C)   TempSrc:      SpO2: 99%  99% 100%  Weight:      Height:       No data found.   Intake/Output Summary (Last 24 hours) at 01/31/2022 1715 Last data filed at 01/31/2022 1409 Gross per 24 hour  Intake 1959.69 ml  Output 1250 ml  Net 709.69 ml   Filed Weights   01/24/22 1614  Weight: 49.9 kg    Exam:  GEN: NAD SKIN: No rash EYES: EOMI ENT: MMM CV: RRR PULM: CTA B ABD: soft, ND, NT, +BS CNS: Alert and oriented to person only.  She is confused but able to follows simple commands.  Speech is slurred.  She moves all extremities spontaneously.  Power is about 3/5 in bilateral lower extremities and about 4/5 in bilateral upper extremities EXT: No edema or tenderness        Data Reviewed:   I have personally reviewed following labs and imaging studies:  Labs: Labs show the following:   Basic Metabolic Panel: Recent Labs  Lab 01/27/22 1034  01/28/22 0740 01/29/22 0904 01/30/22 0637 01/31/22 0541  NA 140 138 141 138 141  K 3.0* 2.9* 3.6 3.5 3.9  CL 109 108 113* 111 114*  CO2 23 20* 21* 18* 18*  GLUCOSE 146* 130* 168* 131* 145*  BUN 23 40* 45* 49* 54*  CREATININE 0.98 1.24* 1.60* 2.11* 2.14*  CALCIUM  9.0 9.2 9.1 8.4* 8.0*  MG 2.3 2.5*  --   --   --    GFR Estimated Creatinine Clearance: 14.6 mL/min (A) (by C-G formula based on SCr of 2.14 mg/dL (H)). Liver Function Tests: Recent Labs  Lab 01/24/22 1942 01/25/22 0527  AST 29 31  ALT 16 23  ALKPHOS 51 51  BILITOT 1.0 1.1  PROT 7.4 7.6  ALBUMIN 4.2 4.2   No results for input(s): "LIPASE", "AMYLASE" in the last 168 hours. No results for input(s): "AMMONIA" in the last 168 hours. Coagulation profile Recent Labs  Lab 01/25/22 0527  INR 1.0    CBC: Recent Labs  Lab 01/27/22 1034 01/28/22 0740 01/29/22 0936 01/30/22 0637 01/31/22 0541  WBC 16.3* 15.8* 16.5* 12.4* 52.3*  NEUTROABS 14.8* 13.8* 13.8*  --  47.4*  HGB 11.6* 10.5* 9.9* 9.7* 8.7*  HCT 33.9* 30.6* 28.8* 28.5* 26.0*  MCV 92.9 92.7 92.0 94.7 94.2  PLT 126* 136* 145* 105* 96*   Cardiac Enzymes: No results for input(s): "CKTOTAL", "CKMB", "CKMBINDEX", "TROPONINI" in the last 168 hours. BNP (last 3 results) No results for input(s): "PROBNP" in the last 8760 hours. CBG: Recent Labs  Lab 01/27/22 0957 01/30/22 0539  GLUCAP 145* 144*   D-Dimer: Recent Labs    01/30/22 0637  DDIMER >20.00*   Hgb A1c: Recent Labs    01/31/22 0541  HGBA1C 5.1   Lipid Profile: Recent Labs    01/31/22 0541  CHOL 86  HDL 25*  LDLCALC 47  TRIG 70  CHOLHDL 3.4   Thyroid function studies: No results for input(s): "TSH", "T4TOTAL", "T3FREE", "THYROIDAB" in the last 72 hours.  Invalid input(s): "FREET3" Anemia work up: No results for input(s): "VITAMINB12", "FOLATE", "FERRITIN", "TIBC", "IRON", "RETICCTPCT" in the last 72 hours. Sepsis Labs: Recent Labs  Lab 01/28/22 0740 01/29/22 0936  01/30/22 0637 01/30/22 0754 01/31/22 0541  PROCALCITON  --   --  5.36  --   --   WBC 15.8* 16.5* 12.4*  --  52.3*  LATICACIDVEN  --   --  3.4* 2.8*  --     Microbiology Recent Results (from the past 240 hour(s))  Culture, blood (Routine X 2) w Reflex to ID Panel     Status: None (Preliminary result)   Collection Time: 01/30/22  3:04 PM   Specimen: BLOOD  Result Value Ref Range Status   Specimen Description   Final    BLOOD RIGHT ANTECUBITAL Performed at [facility], [address], [place], [place] [address]    Special Requests   Final    BOTTLES DRAWN AEROBIC AND ANAEROBIC Blood Culture adequate volume Performed at [facility], [address] [place], [place] [address]    Culture  Setup Time   Final    GRAM NEGATIVE RODS IN BOTH AEROBIC AND ANAEROBIC BOTTLES CRITICAL RESULT CALLED TO, READ BACK BY AND VERIFIED WITH: BRANDON BEERS @ 4081 01/31/22 LFD CRITICAL VALUE NOTED.  VALUE IS CONSISTENT WITH PREVIOUSLY REPORTED AND CALLED VALUE. Performed at [facility], [place] [address] [place],  [address]    Culture GRAM NEGATIVE RODS  Final   Report Status PENDING  Incomplete  Culture, blood (Routine X 2) w Reflex to ID Panel     Status: None (Preliminary result)   Collection Time: 01/30/22  3:04 PM   Specimen: BLOOD  Result Value Ref Range Status   Specimen Description BLOOD LEFT ANTECUBITAL  Final   Special Requests   Final  BOTTLES DRAWN AEROBIC AND ANAEROBIC Blood Culture adequate volume   Culture  Setup Time   Final    GRAM NEGATIVE RODS IN BOTH AEROBIC AND ANAEROBIC BOTTLES Organism ID to follow CRITICAL RESULT CALLED TO, READ BACK BY AND VERIFIED WITH: BRANDON BEERS @ 2426 01/31/22 LFD Performed at [facility], [address] [place], [place] [address]    Culture GRAM NEGATIVE RODS  Final   Report Status PENDING  Incomplete  Blood Culture ID Panel (Reflexed)     Status: Abnormal   Collection Time: 01/30/22  3:04 PM   Result Value Ref Range Status   Enterococcus faecalis NOT DETECTED NOT DETECTED Final   Enterococcus Faecium NOT DETECTED NOT DETECTED Final   Listeria monocytogenes NOT DETECTED NOT DETECTED Final   Staphylococcus species NOT DETECTED NOT DETECTED Final   Staphylococcus aureus (BCID) NOT DETECTED NOT DETECTED Final   Staphylococcus epidermidis NOT DETECTED NOT DETECTED Final   Staphylococcus lugdunensis NOT DETECTED NOT DETECTED Final   Streptococcus species NOT DETECTED NOT DETECTED Final   Streptococcus agalactiae NOT DETECTED NOT DETECTED Final   Streptococcus pneumoniae NOT DETECTED NOT DETECTED Final   Streptococcus pyogenes NOT DETECTED NOT DETECTED Final   A.calcoaceticus-baumannii NOT DETECTED NOT DETECTED Final   Bacteroides fragilis NOT DETECTED NOT DETECTED Final   Enterobacterales DETECTED (A) NOT DETECTED Final    Comment: Enterobacterales represent a large order of gram negative bacteria, not a single organism. CRITICAL RESULT CALLED TO, READ BACK BY AND VERIFIED WITH: BRANDON BEERS @ 6222 01/31/22 LFD    Enterobacter cloacae complex NOT DETECTED NOT DETECTED Final   Escherichia coli NOT DETECTED NOT DETECTED Final   Klebsiella aerogenes NOT DETECTED NOT DETECTED Final   Klebsiella oxytoca NOT DETECTED NOT DETECTED Final   Klebsiella pneumoniae DETECTED (A) NOT DETECTED Final    Comment: CRITICAL RESULT CALLED TO, READ BACK BY AND VERIFIED WITH: BRANDON BEERS @ 9798 01/31/22 LFD    Proteus species NOT DETECTED NOT DETECTED Final   Salmonella species NOT DETECTED NOT DETECTED Final   Serratia marcescens NOT DETECTED NOT DETECTED Final   Haemophilus influenzae NOT DETECTED NOT DETECTED Final   Neisseria meningitidis NOT DETECTED NOT DETECTED Final   Pseudomonas aeruginosa NOT DETECTED NOT DETECTED Final   Stenotrophomonas maltophilia NOT DETECTED NOT DETECTED Final   Candida albicans NOT DETECTED NOT DETECTED Final   Candida auris NOT DETECTED NOT DETECTED Final    Candida glabrata NOT DETECTED NOT DETECTED Final   Candida krusei NOT DETECTED NOT DETECTED Final   Candida parapsilosis NOT DETECTED NOT DETECTED Final   Candida tropicalis NOT DETECTED NOT DETECTED Final   Cryptococcus neoformans/gattii NOT DETECTED NOT DETECTED Final   CTX-M ESBL NOT DETECTED NOT DETECTED Final   Carbapenem resistance IMP NOT DETECTED NOT DETECTED Final   Carbapenem resistance KPC NOT DETECTED NOT DETECTED Final   Carbapenem resistance NDM NOT DETECTED NOT DETECTED Final   Carbapenem resist OXA 48 LIKE NOT DETECTED NOT DETECTED Final   Carbapenem resistance VIM NOT DETECTED NOT DETECTED Final    Comment: Performed at [facility], [address] [place], [place] [address]  MRSA Next Gen by PCR, Nasal     Status: None   Collection Time: 01/30/22  5:26 PM   Specimen: Nasal Mucosa; Nasal Swab  Result Value Ref Range Status   MRSA by PCR Next Gen NOT DETECTED NOT DETECTED Final    Comment: (NOTE) The GeneXpert MRSA Assay (FDA approved for NASAL specimens only), is one component of  a comprehensive MRSA colonization surveillance program. It is not intended to diagnose MRSA infection nor to guide or monitor treatment for MRSA infections. Test performance is not FDA approved in patients less than 31 years old. Performed at [facility], [address] [place], [place] [address]     Procedures and diagnostic studies:  US Venous Img Lower Bilateral (DVT)  Result Date: 01/31/2022 CLINICAL DATA:  Recent surgery.  Elevated D-dimer. EXAM: BILATERAL LOWER EXTREMITY VENOUS DOPPLER ULTRASOUND TECHNIQUE: Gray-scale sonography with graded compression, as well as color Doppler and duplex ultrasound were performed to evaluate the lower extremity deep venous systems from the level of the common femoral vein and including the common femoral, femoral, profunda femoral, popliteal and calf veins including the posterior tibial, peroneal and gastrocnemius veins when  visible. The superficial great saphenous vein was also interrogated. Spectral Doppler was utilized to evaluate flow at rest and with distal augmentation maneuvers in the common femoral, femoral and popliteal veins. COMPARISON:  None Available. FINDINGS: RIGHT LOWER EXTREMITY Common Femoral Vein: No evidence of thrombus. Normal compressibility, respiratory phasicity and response to augmentation. Saphenofemoral Junction: No evidence of thrombus. Normal compressibility and flow on color Doppler imaging. Profunda Femoral Vein: No evidence of thrombus. Normal compressibility and flow on color Doppler imaging. Femoral Vein: No evidence of thrombus. Normal compressibility, respiratory phasicity and response to augmentation. Popliteal Vein: No evidence of thrombus. Normal compressibility, respiratory phasicity and response to augmentation. Calf Veins: Positive for thrombus. There is 1 peroneal vein that is not compressible and contains thrombus. The posterior tibial veins appear to be patent with normal compressibility. Other Findings:  None. LEFT LOWER EXTREMITY Common Femoral Vein: No evidence of thrombus. Normal compressibility, respiratory phasicity and response to augmentation. Saphenofemoral Junction: No evidence of thrombus. Normal compressibility and flow on color Doppler imaging. Profunda Femoral Vein: No evidence of thrombus. Normal compressibility and flow on color Doppler imaging. Femoral Vein: No evidence of thrombus. Normal compressibility, respiratory phasicity and response to augmentation. Popliteal Vein: No evidence of thrombus. Normal compressibility, respiratory phasicity and response to augmentation. Calf Veins: Positive for thrombus. There is a non compressible posterior tibial vein compatible with DVT. The second posterior tibial vein appears to be patent with normal compressibility. Left peroneal veins demonstrate normal compressibility and color Doppler flow. Other Findings:  None. IMPRESSION: 1.  Positive for bilateral calf deep vein thrombosis. There is thrombus involving 1 right peroneal vein and 1 left posterior tibial vein. Electronically Signed   By: Markus Daft M.D.   On: 01/31/2022 09:45   MR ANGIO HEAD WO CONTRAST  Result Date: 01/31/2022 CLINICAL DATA:  81 year old female with neurologic deficit and patchy right greater than left hemisphere infarcts on MRI yesterday. EXAM: MRA HEAD WITHOUT CONTRAST TECHNIQUE: Angiographic images of the Circle of Willis were acquired using MRA technique without intravenous contrast. COMPARISON:  Brain MRI 01/30/2022. Neck MRA today reported separately. FINDINGS: Study is moderately degraded by motion artifact despite repeated imaging attempts. Anterior circulation: Antegrade flow in both ICA siphons, and both carotid termini appear patent, but supraclinoid ICA detail is degraded. MCA and ACA origins appear patent, but otherwise the branch detail is degraded. Posterior circulation: Antegrade flow in the posterior circulation with dominant appearing distal left vertebral artery. No distal vertebral or vertebrobasilar junction stenosis. Patent basilar artery without evidence of stenosis, although there is decreased detail of the basilar tip. PCA origins are patent. A right posterior communicating artery is visible. P1 and P2 segments appear patent but otherwise PCA branch detail  is degraded. Anatomic variants: None significant are apparent. Other: No intracranial mass effect or ventriculomegaly is evident. IMPRESSION: Limited intracranial MRA. Patent ICA siphons, and no evidence of a posterior circulation large vessel occlusion. But no other circle-of-Willis branch detail. Electronically Signed   By: Genevie Ann M.D.   On: 01/31/2022 09:20   MR ANGIO NECK WO CONTRAST  Result Date: 01/31/2022 CLINICAL DATA:  81 year old female with neurologic deficit and patchy right greater than left hemisphere infarcts on MRI yesterday. EXAM: MRA NECK WITHOUT CONTRAST TECHNIQUE:  Angiographic images of the neck were acquired using MRA technique without intravenous contrast. Carotid stenosis measurements (when applicable) are obtained utilizing NASCET criteria, using the distal internal carotid diameter as the denominator. COMPARISON:  Brain MRI 01/30/2022. FINDINGS: Study is moderately degraded by motion artifact despite repeated imaging attempts. Evidence of a 3 vessel arch configuration. Antegrade flow in both cervical carotid arteries throughout the neck and to the skull base. Degraded detail of both carotid bifurcations, but no carotid flow gaps to suggest high-grade stenosis. Similarly, degraded detail of both cervical vertebral arteries which demonstrate maintained antegrade flow signal to the skull base. IMPRESSION: Limited noncontrast neck MRA demonstrating patent cervical carotid and vertebral arteries. No high-grade stenosis is apparent. Electronically Signed   By: Genevie Ann M.D.   On: 01/31/2022 09:11   MR BRAIN W WO CONTRAST  Result Date: 01/30/2022 CLINICAL DATA:  Neuro deficit, acute, stroke suspected. EXAM: MRI HEAD WITHOUT AND WITH CONTRAST TECHNIQUE: Multiplanar, multiecho pulse sequences of the brain and surrounding structures were obtained without and with intravenous contrast. CONTRAST:  72m GADAVIST GADOBUTROL 1 MMOL/ML IV SOLN COMPARISON:  Head CT January 31, 2019. FINDINGS: The study is significantly degraded by motion. Brain: Areas of restricted diffusion are seen in the bilateral frontoparietal regions and right temporal lobe. The largest area of restricted diffusion is in the right parietal lobe where there is also interspersed areas of vasogenic edema with sulcal effacement. These areas show prominent hyperintensity on T2/FLAIR and are suggestive of subacute infarcts. No significant mass effect or midline shift. No evidence of hemorrhagic transformation. No acute hemorrhage, hydrocephalus, extra-axial collection or mass lesion. No evidence of abnormal focus of  contrast enhancement, although postcontrast images are significantly degraded by motion. Vascular: Normal flow voids. Skull and upper cervical spine: Normal marrow signal. Sinuses/Orbits: Negative. Other: None. IMPRESSION: Areas of restricted diffusion in the bilateral frontoparietal regions and right temporal lobe consistent with subacute infarcts. No evidence of hemorrhagic transformation or significant mass effect. Findings are concerning for embolic etiology. Electronically Signed   By: KPedro EarlsM.D.   On: 01/30/2022 16:42   NM Pulmonary Perfusion  Result Date: 01/30/2022 CLINICAL DATA:  Chest pain, fall EXAM: NUCLEAR MEDICINE PERFUSION LUNG SCAN TECHNIQUE: Perfusion images were obtained in multiple projections after intravenous injection of radiopharmaceutical. Ventilation scans intentionally deferred if perfusion scan and chest x-ray adequate for interpretation during COVID 19 epidemic. RADIOPHARMACEUTICALS:  4.24 mCi Tc-961mAA IV COMPARISON:  Chest radiographs done earlier today FINDINGS: There are no discrete wedge-shaped perfusion defects. There are ill-defined areas of decreased uptake in mid and lower lung fields, more so in the left side. IMPRESSION: Low probability for pulmonary embolism. Electronically Signed   By: PaElmer Picker.D.   On: 01/30/2022 16:30   CT HEAD WO CONTRAST (5MM)  Result Date: 01/30/2022 CLINICAL DATA:  Mental status change. EXAM: CT HEAD WITHOUT CONTRAST TECHNIQUE: Contiguous axial images were obtained from the base of the skull through the vertex without  intravenous contrast. RADIATION DOSE REDUCTION: This exam was performed according to the departmental dose-optimization program which includes automated exposure control, adjustment of the mA and/or kV according to patient size and/or use of iterative reconstruction technique. COMPARISON:  01/24/22 CT Brain FINDINGS: Limitations: Motion degraded exam. Brain: Compared to 01/24/2022 CT head,  there is a new infarct in the right parietal lobe. No evidence of hemorrhage. No extra-axial fluid collection. No hydrocephalus. Vascular: No hyperdense vessel or unexpected calcification. Skull: Normal. Negative for fracture or focal lesion. Sinuses/Orbits: No acute finding. Other: None. IMPRESSION: New right parietal lobe infarct. No evidence of hemorrhage. These results will be called to the ordering clinician or representative by the Radiologist Assistant, and communication documented in the PACS or [employer]. Electronically Signed   By: Marin Roberts M.D.   On: 01/30/2022 09:51   DG Chest Port 1 View  Result Date: 01/30/2022 CLINICAL DATA:  81 year old female with shortness of breath. EXAM: PORTABLE CHEST 1 VIEW COMPARISON:  Portable chest 01/24/2022 and earlier. FINDINGS: Portable AP upright view at 0547 hours. Larger lung volumes. Mediastinal contours are within normal limits. Visualized tracheal air column is within normal limits. Allowing for portable technique the lungs are clear. No pneumothorax or pleural effusion. No acute osseous abnormality identified. Negative visible bowel gas. IMPRESSION: No acute cardiopulmonary abnormality. Electronically Signed   By: Genevie Ann M.D.   On: 01/30/2022 06:56               LOS: 7 days   Miracle Criado  [facility]   Pager on [email or web address]. If 7PM-7AM, please contact night-coverage at [email or web address]     01/31/2022, 5:15 PM

## 2022-01-31 NOTE — Progress Notes (Signed)
Nutrition Follow-up  DOCUMENTATION CODES:   Not applicable  INTERVENTION:   -RD will follow for diet advancement and add supplements as appropriate -If pt unable to take PO's and is within her goals of care, consider initiation of nutrition support:   Initiate Osmolite 1.2 @ 20 ml/hr and increase by 10 ml every 12 hours to goal rate of 60 ml/hr.   80 ml free water flush every 6 hours  Tube feeding regimen provides 1728 kcal (100% of needs), 80 grams of protein, and 1181 ml of H2O. Total free water: 1501 ml daily  -If feedings are started, monitor Mg, K, and Phos daily and ands replete as needed due to high refeeding risk  NUTRITION DIAGNOSIS:   Inadequate oral intake related to poor appetite as evidenced by meal completion < 25%.  Ongoing  GOAL:   Patient will meet greater than or equal to 90% of their needs  Progressing   MONITOR:   PO intake, Supplement acceptance, Labs, Weight trends  REASON FOR ASSESSMENT:   Consult Assessment of nutrition requirement/status  ASSESSMENT:   81 y.o. female presented to the ED after a fall at home. PMH includes malnutrition, GERD, and HTN. Pt admitted with R femoral fracture.  Reviewed I/O's: +510 ml x 24 hours and +1.3 L since admission  UOP: 1.5 L x 24 hours   Neurology following for multifocal subacute embolic infracts.   Attempted to speak with pt and family members x 2. Pt working with therapy or out of room for procedure at time of visit. Pt with poor oral intake. Noted meal completions 0-50%. Per PT, pt has complains of coughing and choking while drinking liquids. Case discussed with SLP and MD; SLP consult has been ordered. Pt is currently NPO.   Per TOC notes, plan for SNF placement at discharge.   Medications reviewed and include miralax and dextrose 5% and 0.45% NaCl with KCl 40 mEq/L infusion @ 100 ml/hr.   Labs reviewed: CBGS: 144.    Diet Order:   Diet Order             Diet NPO time specified  Diet  effective now                   EDUCATION NEEDS:   No education needs have been identified at this time  Skin:  Skin Assessment: Reviewed RN Assessment  Last BM:  Unknown  Height:   Ht Readings from Last 1 Encounters:  01/24/22 5' (1.524 m)    Weight:   Wt Readings from Last 1 Encounters:  01/24/22 49.9 kg    Ideal Body Weight:  45.5 kg  BMI:  Body mass index is 21.48 kg/m.  Estimated Nutritional Needs:   Kcal:  1500-1700  Protein:  75-90 grams  Fluid:  >/= 1.5 L    Loistine Chance, RD, LDN, [place] Registered Dietitian II Certified Diabetes Care and Education Specialist Please refer to [facility] for RD and/or RD on-call/weekend/after hours pager

## 2022-02-01 ENCOUNTER — Encounter
Admission: EM | Disposition: A | Discharge status: Skilled Nursing Facility | Payer: Self-pay | Source: Home / Self Care | Attending: Internal Medicine

## 2022-02-01 DIAGNOSIS — E876 Hypokalemia: Secondary | ICD-10-CM | POA: Diagnosis not present

## 2022-02-01 DIAGNOSIS — S72141A Displaced intertrochanteric fracture of right femur, initial encounter for closed fracture: Secondary | ICD-10-CM | POA: Diagnosis not present

## 2022-02-01 DIAGNOSIS — S72001A Fracture of unspecified part of neck of right femur, initial encounter for closed fracture: Secondary | ICD-10-CM | POA: Diagnosis not present

## 2022-02-01 DIAGNOSIS — D72823 Leukemoid reaction: Secondary | ICD-10-CM | POA: Diagnosis not present

## 2022-02-01 DIAGNOSIS — A419 Sepsis, unspecified organism: Secondary | ICD-10-CM | POA: Diagnosis not present

## 2022-02-01 DIAGNOSIS — D72825 Bandemia: Secondary | ICD-10-CM | POA: Diagnosis not present

## 2022-02-01 DIAGNOSIS — B961 Klebsiella pneumoniae [K. pneumoniae] as the cause of diseases classified elsewhere: Secondary | ICD-10-CM | POA: Diagnosis not present

## 2022-02-01 DIAGNOSIS — N179 Acute kidney failure, unspecified: Secondary | ICD-10-CM

## 2022-02-01 DIAGNOSIS — Y92009 Unspecified place in unspecified non-institutional (private) residence as the place of occurrence of the external cause: Secondary | ICD-10-CM | POA: Diagnosis not present

## 2022-02-01 DIAGNOSIS — R7881 Bacteremia: Secondary | ICD-10-CM | POA: Diagnosis not present

## 2022-02-01 DIAGNOSIS — W19XXXA Unspecified fall, initial encounter: Secondary | ICD-10-CM | POA: Diagnosis not present

## 2022-02-01 DIAGNOSIS — I639 Cerebral infarction, unspecified: Secondary | ICD-10-CM | POA: Diagnosis not present

## 2022-02-01 DIAGNOSIS — R6520 Severe sepsis without septic shock: Secondary | ICD-10-CM | POA: Diagnosis not present

## 2022-02-01 LAB — URINE CULTURE: Culture: 50000 — AB

## 2022-02-01 LAB — MAGNESIUM: Magnesium: 2.1 mg/dL (ref 1.7–2.4)

## 2022-02-01 LAB — CBC WITH DIFFERENTIAL/PLATELET
Abs Immature Granulocytes: 1.34 10*3/uL — ABNORMAL HIGH (ref 0.00–0.07)
Basophils Absolute: 0.1 10*3/uL (ref 0.0–0.1)
Basophils Relative: 0 %
Eosinophils Absolute: 0.2 10*3/uL (ref 0.0–0.5)
Eosinophils Relative: 1 %
HCT: 32.1 % — ABNORMAL LOW (ref 36.0–46.0)
Hemoglobin: 10.8 g/dL — ABNORMAL LOW (ref 12.0–15.0)
Immature Granulocytes: 4 %
Lymphocytes Relative: 4 %
Lymphs Abs: 1.4 10*3/uL (ref 0.7–4.0)
MCH: 32.2 pg (ref 26.0–34.0)
MCHC: 33.6 g/dL (ref 30.0–36.0)
MCV: 95.8 fL (ref 80.0–100.0)
Monocytes Absolute: 0.6 10*3/uL (ref 0.1–1.0)
Monocytes Relative: 2 %
Neutro Abs: 32.4 10*3/uL — ABNORMAL HIGH (ref 1.7–7.7)
Neutrophils Relative %: 89 %
Platelets: 114 10*3/uL — ABNORMAL LOW (ref 150–400)
RBC: 3.35 MIL/uL — ABNORMAL LOW (ref 3.87–5.11)
RDW: 13.1 % (ref 11.5–15.5)
Smear Review: NORMAL
WBC: 36 10*3/uL — ABNORMAL HIGH (ref 4.0–10.5)
nRBC: 0 % (ref 0.0–0.2)

## 2022-02-01 LAB — COMPREHENSIVE METABOLIC PANEL
ALT: 12 U/L (ref 0–44)
AST: 25 U/L (ref 15–41)
Albumin: 2.7 g/dL — ABNORMAL LOW (ref 3.5–5.0)
Alkaline Phosphatase: 84 U/L (ref 38–126)
Anion gap: 10 (ref 5–15)
BUN: 46 mg/dL — ABNORMAL HIGH (ref 8–23)
CO2: 17 mmol/L — ABNORMAL LOW (ref 22–32)
Calcium: 8.4 mg/dL — ABNORMAL LOW (ref 8.9–10.3)
Chloride: 119 mmol/L — ABNORMAL HIGH (ref 98–111)
Creatinine, Ser: 1.23 mg/dL — ABNORMAL HIGH (ref 0.44–1.00)
GFR, Estimated: 44 mL/min — ABNORMAL LOW (ref >60)
Glucose, Bld: 147 mg/dL — ABNORMAL HIGH (ref 70–99)
Potassium: 4 mmol/L (ref 3.5–5.1)
Sodium: 146 mmol/L — ABNORMAL HIGH (ref 135–145)
Total Bilirubin: 1.2 mg/dL (ref 0.3–1.2)
Total Protein: 6 g/dL — ABNORMAL LOW (ref 6.5–8.1)

## 2022-02-01 LAB — PHOSPHORUS: Phosphorus: 2.7 mg/dL (ref 2.5–4.6)

## 2022-02-01 SURGERY — ECHOCARDIOGRAM, TRANSESOPHAGEAL
Anesthesia: Moderate Sedation

## 2022-02-01 MED ORDER — ENOXAPARIN SODIUM 30 MG/0.3ML IJ SOSY
30.0000 mg | PREFILLED_SYRINGE | Freq: Every day | INTRAMUSCULAR | Status: DC
  Administered 2022-02-01 – 2022-02-03 (×3): 30 mg via SUBCUTANEOUS
  Filled 2022-02-01 (×3): qty 0.3

## 2022-02-01 MED ORDER — SODIUM CHLORIDE 0.9 % IV SOLN: INTRAVENOUS | Status: DC

## 2022-02-01 MED ORDER — ASPIRIN 81 MG PO CHEW
81.0000 mg | CHEWABLE_TABLET | Freq: Every day | ORAL | Status: DC
  Administered 2022-02-02 – 2022-02-06 (×5): 81 mg via ORAL
  Filled 2022-02-01 (×5): qty 1

## 2022-02-01 NOTE — Plan of Care (Signed)
  Problem: Education: Goal: Knowledge of General Education information will improve Description: Including pain rating scale, medication(s)/side effects and non-pharmacologic comfort measures Outcome: Progressing   Problem: Health Behavior/Discharge Planning: Goal: Ability to manage health-related needs will improve Outcome: Progressing   Problem: Clinical Measurements: Goal: Ability to maintain clinical measurements within normal limits will improve Outcome: Progressing Goal: Will remain free from infection Outcome: Progressing Goal: Diagnostic test results will improve Outcome: Progressing Goal: Respiratory complications will improve Outcome: Progressing Goal: Cardiovascular complication will be avoided Outcome: Progressing   Problem: Activity: Goal: Risk for activity intolerance will decrease Outcome: Progressing   Problem: Coping: Goal: Level of anxiety will decrease Outcome: Progressing   Problem: Elimination: Goal: Will not experience complications related to bowel motility Outcome: Progressing   Problem: Pain Managment: Goal: General experience of comfort will improve Outcome: Progressing   Problem: Safety: Goal: Ability to remain free from injury will improve Outcome: Progressing   Problem: Skin Integrity: Goal: Risk for impaired skin integrity will decrease Outcome: Progressing   Problem: Nutrition: Goal: Adequate nutrition will be maintained Outcome: Not Progressing Note: dysphagia   Problem: Elimination: Goal: Will not experience complications related to urinary retention Outcome: Not Progressing Note: Foley placed

## 2022-02-01 NOTE — Progress Notes (Signed)
PHARMACIST - PHYSICIAN COMMUNICATION  CONCERNING:  Enoxaparin (Lovenox) for DVT Prophylaxis    RECOMMENDATION: Patient was prescribed enoxaprin '40mg'$  q24 hours for VTE prophylaxis.   Filed Weights   01/24/22 1614  Weight: 49.9 kg (110 lb)    Body mass index is 21.48 kg/m.  Estimated Creatinine Clearance: 25.3 mL/min (A) (by C-G formula based on SCr of 1.23 mg/dL (H)).   Patient is candidate for enoxaparin '30mg'$  every 24 hours based on CrCl <66m/min or Weight <45kg  DESCRIPTION: [facility]  Patient is now receiving enoxaparin 30 mg every 24 hours    SPernell Dupre PharmD Clinical Pharmacist  02/01/2022 12:42 PM

## 2022-02-01 NOTE — Plan of Care (Signed)
Contacted by Dr. Mal Misty regarding BLE [place] (+) for DVT bilat below the knee. Given the large size of her acute/subacute R parietal infarct, recommend DAPT but not heparin gtt 2/2 risk of hemorrhagic conversion. She may receive ASA '300mg'$  PR daily alone until she is safely able to take po (do not need to pass NGT specifically for plavix at this time).  Su Monks, MD [facility] [phone]  If 7pm- 7am, please page neurology on call as listed in [place].

## 2022-02-01 NOTE — Progress Notes (Addendum)
Progress Note    Katie Bell  JAS:[id number] DOB: May 22, 1940  DOA: 01/24/2022 PCP: Venia Carbon, MD      Brief Narrative:    Medical records reviewed and are as summarized below:  Katie Bell is a 81 y.o. female medical history significant for hypertension, anxiety, history of basal cell carcinoma, GERD, kidney stones, who presented to the hospital after sustaining a mechanical fall at home.  She was found to have right hip fracture.        Assessment/Plan:   Principal Problem:   Closed right femoral fracture (HCC) Active Problems:   Hypertension   Severe sepsis ([place])   Leucocytosis   Fall at home, initial encounter   Bacteremia due to Klebsiella pneumoniae   CVA (cerebral vascular accident) ([place])   AKI (acute kidney injury) ([place])   Hypokalemia   Nutrition Problem: Inadequate oral intake Etiology: poor appetite  Signs/Symptoms: meal completion < 25%   Body mass index is 21.48 kg/m.   Acute embolic stroke: Continue aspirin and Plavix.  Follow-up with neurologist for further recommendations.  2D echo showed EF estimated at 55 to 60%, indeterminate LV diastolic parameters, no evidence of vegetations.  Cardiologist has been consulted for TEE.   Dysphagia: Speech therapist recommended dysphagia 1 diet and nectar thick liquids.   Acute lower extremity (bilateral calf) DVT: 1 right peroneal vein and left posterior tibial vein DVT.  No full dose anticoagulation for now.  Plan to repeat CT head in 3 days.  Full dose anticoagulation will be considered if there is no evidence of hemorrhagic conversion.  Case discussed with Dr. Quinn Axe.  Per Dr. Quinn Axe, neurologist, okay with prophylactic dose of Lovenox.     Severe sepsis secondary to Klebsiella pneumonia bacteremia, severe leukocytosis, recent fever (Tmax 102.1 F) on 01/30/2022: WBC is down from 52.3 to 36.  Continue IV ceftriaxone.  Follow-up with ID specialist for further recommendations.  Urine culture  showed yeast..  Lactic acid down from 3.4-2.8.  Procalcitonin 5.36.  Acute hypoxic respiratory failure: Improved.  She is tolerating room air.  AKI: Improving.  Creatinine is trending down.  Discontinue IV fluids and monitor BMP.  Right hip fracture: S/p right intramedullary nail intertrochanteric on 01/25/2022.  Acute blood loss anemia: H&H is stable with hemoglobin of 10.8.    Diet Order             DIET - DYS 1 Room service appropriate? Yes; Fluid consistency: Nectar Thick  Diet effective now                            Consultants: Neurologist ID specialist Orthopedic surgeon Cardiologist  Procedures: Right intramedullary nail intertrochanteric for right hip fracture on 01/25/2022    Medications:    aspirin  150 mg Rectal Daily   clopidogrel  75 mg Oral Daily   docusate sodium  100 mg Oral BID   enoxaparin (LOVENOX) injection  30 mg Subcutaneous Daily   feeding supplement  237 mL Oral BID BM   multivitamin with minerals  1 tablet Oral Daily   pantoprazole  40 mg Oral Daily   polyethylene glycol  17 g Oral Daily   Continuous Infusions:  sodium chloride     cefTRIAXone (ROCEPHIN)  IV 2 g (02/01/22 0055)   dextrose 5 % and 0.45 % NaCl with KCl 40 mEq/L 100 mL/hr at 02/01/22 0956     Anti-infectives (From admission, onward)  Start     Dose/Rate Route Frequency Ordered Stop   01/30/22 1800  vancomycin (VANCOCIN) IVPB 1000 mg/200 mL premix        1,000 mg 200 mL/hr over 60 Minutes Intravenous  Once 01/30/22 1713 01/30/22 1945   01/30/22 1713  vancomycin variable dose per unstable renal function (pharmacist dosing)  Status:  Discontinued         Does not apply See admin instructions 01/30/22 1713 01/31/22 1339   01/30/22 1600  cefTRIAXone (ROCEPHIN) 2 g in sodium chloride 0.9 % 100 mL IVPB        2 g 200 mL/hr over 30 Minutes Intravenous Every 24 hours 01/30/22 1501     01/25/22 2200  ceFAZolin (ANCEF) IVPB 2g/100 mL premix        2 g 200 mL/hr  over 30 Minutes Intravenous Every 6 hours 01/25/22 2031 01/26/22 0510   01/25/22 1700  gentamicin 80 mg in 0.9% sodium chloride 250 mL irrigation  Status:  Discontinued          As needed 01/25/22 1700 01/25/22 1712   01/25/22 1350  ceFAZolin (ANCEF) 1-4 GM/50ML-% IVPB       Note to Pharmacy: Herby Abraham W: cabinet override      01/25/22 1350 01/25/22 1624   01/25/22 0800  ceFAZolin (ANCEF) IVPB 1 g/50 mL premix        1 g 100 mL/hr over 30 Minutes Intravenous To Surgery 01/24/22 2112 01/25/22 1645              Family Communication/Anticipated D/C date and plan/Code Status   DVT prophylaxis: enoxaparin (LOVENOX) injection 30 mg Start: 02/01/22 2200 SCDs Start: 01/25/22 1708     Code Status: Full Code  Family Communication: Plan discussed with [name] (son)  over the phone. Disposition Plan: Plan to discharge to SNF when medically stable   Status is: Inpatient Remains inpatient appropriate because: Acute stroke, Klebsiella bacteremia       Subjective:   Interval events noted.  She is still confused unable to provide any history.  Speech is slurred and difficult to comprehend.  Cheral Bay, PT, was working with her at the bedside.  Objective:    Vitals:   01/31/22 0315 01/31/22 0554 01/31/22 2302 02/01/22 0854  BP: 115/64 (!) 117/58 134/78 136/89  Pulse: 95 89 93 96  Resp: '20 20 16 16  '$ Temp: 98 F (36.7 C)  98.8 F (37.1 C) 97.6 F (36.4 C)  TempSrc:      SpO2: 99% 100% 97% 98%  Weight:      Height:       No data found.   Intake/Output Summary (Last 24 hours) at 02/01/2022 1412 Last data filed at 02/01/2022 0600 Gross per 24 hour  Intake 683.37 ml  Output 1450 ml  Net -766.63 ml   Filed Weights   01/24/22 1614  Weight: 49.9 kg    Exam:  GEN: NAD, sitting up at the edge of the bed with PT SKIN: Warm and dry. Ecchymosis on right hip EYES: EOMI ENT: MMM CV: RRR PULM: CTA B ABD: soft, ND, NT, +BS CNS: Alert and oriented to person only.  Speech  is slurred.  No focal deficits noted. EXT: No edema or tenderness       Data Reviewed:   I have personally reviewed following labs and imaging studies:  Labs: Labs show the following:   Basic Metabolic Panel: Recent Labs  Lab 01/27/22 1034 01/28/22 0740 01/29/22 0904 01/30/22 2993 01/31/22 0541 02/01/22  0629  NA 140 138 141 138 141 146*  K 3.0* 2.9* 3.6 3.5 3.9 4.0  CL 109 108 113* 111 114* 119*  CO2 23 20* 21* 18* 18* 17*  GLUCOSE 146* 130* 168* 131* 145* 147*  BUN 23 40* 45* 49* 54* 46*  CREATININE 0.98 1.24* 1.60* 2.11* 2.14* 1.23*  CALCIUM 9.0 9.2 9.1 8.4* 8.0* 8.4*  MG 2.3 2.5*  --   --   --  2.1  PHOS  --   --   --   --   --  2.7   GFR Estimated Creatinine Clearance: 25.3 mL/min (A) (by C-G formula based on SCr of 1.23 mg/dL (H)). Liver Function Tests: Recent Labs  Lab 02/01/22 0629  AST 25  ALT 12  ALKPHOS 84  BILITOT 1.2  PROT 6.0*  ALBUMIN 2.7*   No results for input(s): "LIPASE", "AMYLASE" in the last 168 hours. No results for input(s): "AMMONIA" in the last 168 hours. Coagulation profile No results for input(s): "INR", "PROTIME" in the last 168 hours.   CBC: Recent Labs  Lab 01/28/22 0740 01/29/22 0936 01/30/22 0637 01/31/22 0541 01/31/22 1559 02/01/22 0629  WBC 15.8* 16.5* 12.4* 52.3* 46.8* 36.0*  NEUTROABS 13.8* 13.8*  --  47.4* 41.7* 32.4*  HGB 10.5* 9.9* 9.7* 8.7* 9.3* 10.8*  HCT 30.6* 28.8* 28.5* 26.0* 27.7* 32.1*  MCV 92.7 92.0 94.7 94.2 96.2 95.8  PLT 136* 145* 105* 96* 100* 114*   Cardiac Enzymes: No results for input(s): "CKTOTAL", "CKMB", "CKMBINDEX", "TROPONINI" in the last 168 hours. BNP (last 3 results) No results for input(s): "PROBNP" in the last 8760 hours. CBG: Recent Labs  Lab 01/27/22 0957 01/30/22 0539  GLUCAP 145* 144*   D-Dimer: Recent Labs    01/30/22 0637  DDIMER >20.00*   Hgb A1c: Recent Labs    01/31/22 0541  HGBA1C 5.1   Lipid Profile: Recent Labs    01/31/22 0541  CHOL 86  HDL 25*   LDLCALC 47  TRIG 70  CHOLHDL 3.4   Thyroid function studies: No results for input(s): "TSH", "T4TOTAL", "T3FREE", "THYROIDAB" in the last 72 hours.  Invalid input(s): "FREET3" Anemia work up: No results for input(s): "VITAMINB12", "FOLATE", "FERRITIN", "TIBC", "IRON", "RETICCTPCT" in the last 72 hours. Sepsis Labs: Recent Labs  Lab 01/30/22 1610 01/30/22 0754 01/31/22 0541 01/31/22 1559 02/01/22 0629  PROCALCITON 5.36  --   --   --   --   WBC 12.4*  --  52.3* 46.8* 36.0*  LATICACIDVEN 3.4* 2.8*  --   --   --     Microbiology Recent Results (from the past 240 hour(s))  Culture, blood (Routine X 2) w Reflex to ID Panel     Status: None (Preliminary result)   Collection Time: 01/30/22  3:04 PM   Specimen: BLOOD  Result Value Ref Range Status   Specimen Description   Final    BLOOD RIGHT ANTECUBITAL Performed at [facility], [address] [place], [place] [address]    Special Requests   Final    BOTTLES DRAWN AEROBIC AND ANAEROBIC Blood Culture adequate volume Performed at [facility], [address], [place], [place] [address]    Culture  Setup Time   Final    GRAM NEGATIVE RODS IN BOTH AEROBIC AND ANAEROBIC BOTTLES CRITICAL RESULT CALLED TO, READ BACK BY AND VERIFIED WITH: BRANDON BEERS @ 1914 01/31/22 LFD CRITICAL VALUE NOTED.  VALUE IS CONSISTENT WITH PREVIOUSLY REPORTED AND CALLED VALUE. Performed at [facility], [address] [place], [place] [address]    Culture GRAM NEGATIVE RODS  Final   Report Status PENDING  Incomplete  Culture, blood (Routine X 2) w Reflex to ID Panel     Status: None (Preliminary result)   Collection Time: 01/30/22  3:04 PM   Specimen: BLOOD  Result Value Ref Range Status   Specimen Description BLOOD LEFT ANTECUBITAL  Final   Special Requests   Final    BOTTLES DRAWN AEROBIC AND ANAEROBIC Blood Culture adequate volume   Culture  Setup Time   Final    GRAM NEGATIVE RODS IN BOTH AEROBIC AND ANAEROBIC  BOTTLES Organism ID to follow CRITICAL RESULT CALLED TO, READ BACK BY AND VERIFIED WITH: BRANDON BEERS @ 5732 01/31/22 LFD Performed at [facility], [address] [place], [place] [address]    Culture GRAM NEGATIVE RODS  Final   Report Status PENDING  Incomplete  Blood Culture ID Panel (Reflexed)     Status: Abnormal   Collection Time: 01/30/22  3:04 PM  Result Value Ref Range Status   Enterococcus faecalis NOT DETECTED NOT DETECTED Final   Enterococcus Faecium NOT DETECTED NOT DETECTED Final   Listeria monocytogenes NOT DETECTED NOT DETECTED Final   Staphylococcus species NOT DETECTED NOT DETECTED Final   Staphylococcus aureus (BCID) NOT DETECTED NOT DETECTED Final   Staphylococcus epidermidis NOT DETECTED NOT DETECTED Final   Staphylococcus lugdunensis NOT DETECTED NOT DETECTED Final   Streptococcus species NOT DETECTED NOT DETECTED Final   Streptococcus agalactiae NOT DETECTED NOT DETECTED Final   Streptococcus pneumoniae NOT DETECTED NOT DETECTED Final   Streptococcus pyogenes NOT DETECTED NOT DETECTED Final   A.calcoaceticus-baumannii NOT DETECTED NOT DETECTED Final   Bacteroides fragilis NOT DETECTED NOT DETECTED Final   Enterobacterales DETECTED (A) NOT DETECTED Final    Comment: Enterobacterales represent a large order of gram negative bacteria, not a single organism. CRITICAL RESULT CALLED TO, READ BACK BY AND VERIFIED WITH: BRANDON BEERS @ 2706 01/31/22 LFD    Enterobacter cloacae complex NOT DETECTED NOT DETECTED Final   Escherichia coli NOT DETECTED NOT DETECTED Final   Klebsiella aerogenes NOT DETECTED NOT DETECTED Final   Klebsiella oxytoca NOT DETECTED NOT DETECTED Final   Klebsiella pneumoniae DETECTED (A) NOT DETECTED Final    Comment: CRITICAL RESULT CALLED TO, READ BACK BY AND VERIFIED WITH: BRANDON BEERS @ 2376 01/31/22 LFD    Proteus species NOT DETECTED NOT DETECTED Final   Salmonella species NOT DETECTED NOT DETECTED Final   Serratia  marcescens NOT DETECTED NOT DETECTED Final   Haemophilus influenzae NOT DETECTED NOT DETECTED Final   Neisseria meningitidis NOT DETECTED NOT DETECTED Final   Pseudomonas aeruginosa NOT DETECTED NOT DETECTED Final   Stenotrophomonas maltophilia NOT DETECTED NOT DETECTED Final   Candida albicans NOT DETECTED NOT DETECTED Final   Candida auris NOT DETECTED NOT DETECTED Final   Candida glabrata NOT DETECTED NOT DETECTED Final   Candida krusei NOT DETECTED NOT DETECTED Final   Candida parapsilosis NOT DETECTED NOT DETECTED Final   Candida tropicalis NOT DETECTED NOT DETECTED Final   Cryptococcus neoformans/gattii NOT DETECTED NOT DETECTED Final   CTX-M ESBL NOT DETECTED NOT DETECTED Final   Carbapenem resistance IMP NOT DETECTED NOT DETECTED Final   Carbapenem resistance KPC NOT DETECTED NOT DETECTED Final   Carbapenem resistance NDM NOT DETECTED NOT DETECTED Final   Carbapenem resist OXA 48 LIKE NOT DETECTED NOT DETECTED Final   Carbapenem resistance VIM NOT DETECTED NOT DETECTED Final  Comment: Performed at [facility], [address] [place], [place] [address]  MRSA Next Gen by PCR, Nasal     Status: None   Collection Time: 01/30/22  5:26 PM   Specimen: Nasal Mucosa; Nasal Swab  Result Value Ref Range Status   MRSA by PCR Next Gen NOT DETECTED NOT DETECTED Final    Comment: (NOTE) The GeneXpert MRSA Assay (FDA approved for NASAL specimens only), is one component of a comprehensive MRSA colonization surveillance program. It is not intended to diagnose MRSA infection nor to guide or monitor treatment for MRSA infections. Test performance is not FDA approved in patients less than 32 years old. Performed at [facility], [address] [place], [place] [address]   Urine Culture     Status: Abnormal   Collection Time: 01/30/22  8:44 PM   Specimen: Urine, Clean Catch  Result Value Ref Range Status   Specimen Description   Final    URINE, CLEAN CATCH Performed  at [facility], [address] [place], [place] [address]    Special Requests   Final    NONE Performed at [facility], [place], [place] [address]    Culture 50,000 COLONIES/mL YEAST (A)  Final   Report Status 02/01/2022 FINAL  Final    Procedures and diagnostic studies:  ECHOCARDIOGRAM COMPLETE BUBBLE STUDY  Result Date: 01/31/2022    ECHOCARDIOGRAM REPORT   Patient Name:   Katie Bell Date of Exam: 01/31/2022 Medical Rec #:  [id number]        Height:       60.0 in Accession #:    [id number]       Weight:       110.0 lb Date of Birth:  1940/10/30         BSA:          1.448 m Patient Age:    62 years         BP:           117/58 mmHg Patient Gender: F                HR:           89 bpm. Exam Location:  [facility] Procedure: 2D Echo, Cardiac Doppler, Color Doppler and Saline Contrast Bubble            Study Indications:     Stroke 434.91 / I63.9  History:         Patient has no prior history of Echocardiogram examinations.                  Risk Factors:Hypertension.  Sonographer:     Sherrie Sport Referring Phys:  [id number] Sidney Ace Diagnosing Phys: Kate Sable MD IMPRESSIONS  1. Left ventricular ejection fraction, by estimation, is 55 to 60%. The left ventricle has normal function. The left ventricle has no regional wall motion abnormalities. Left ventricular diastolic parameters are indeterminate.  2. Right ventricular systolic function is normal. The right ventricular size is normal.  3. The mitral valve is normal in structure. No evidence of mitral valve regurgitation.  4. The aortic valve is tricuspid. Aortic valve regurgitation is not visualized. Aortic valve sclerosis is present, with no evidence of aortic valve stenosis.  5. The inferior vena cava is normal in size with greater than 50% respiratory variability, suggesting right atrial pressure of 3 mmHg.  6. Agitated saline contrast bubble study was negative, with no evidence  of any interatrial  shunt. FINDINGS  Left Ventricle: Left ventricular ejection fraction, by estimation, is 55 to 60%. The left ventricle has normal function. The left ventricle has no regional wall motion abnormalities. The left ventricular internal cavity size was normal in size. There is  no left ventricular hypertrophy. Left ventricular diastolic parameters are indeterminate. Right Ventricle: The right ventricular size is normal. No increase in right ventricular wall thickness. Right ventricular systolic function is normal. Left Atrium: Left atrial size was normal in size. Right Atrium: Right atrial size was normal in size. Pericardium: There is no evidence of pericardial effusion. Mitral Valve: The mitral valve is normal in structure. No evidence of mitral valve regurgitation. Tricuspid Valve: The tricuspid valve is normal in structure. Tricuspid valve regurgitation is trivial. Aortic Valve: The aortic valve is tricuspid. Aortic valve regurgitation is not visualized. Aortic valve sclerosis is present, with no evidence of aortic valve stenosis. Aortic valve mean gradient measures 3.0 mmHg. Aortic valve peak gradient measures 5.0  mmHg. Aortic valve area, by VTI measures 1.87 cm. Pulmonic Valve: The pulmonic valve was not well visualized. Pulmonic valve regurgitation is not visualized. Aorta: The aortic root is normal in size and structure. Venous: The inferior vena cava is normal in size with greater than 50% respiratory variability, suggesting right atrial pressure of 3 mmHg. IAS/Shunts: The interatrial septum appears to be lipomatous. No atrial level shunt detected by color flow Doppler. Agitated saline contrast was given intravenously to evaluate for intracardiac shunting. Agitated saline contrast bubble study was negative,  with no evidence of any interatrial shunt.  LEFT VENTRICLE PLAX 2D LVIDd:         3.60 cm   Diastology LVIDs:         2.20 cm   LV e' medial:    10.70 cm/s LV PW:         1.00 cm   LV E/e' medial:  6.7 LV  IVS:        1.10 cm   LV e' lateral:   10.80 cm/s LVOT diam:     2.00 cm   LV E/e' lateral: 6.6 LV SV:         36 LV SV Index:   25 LVOT Area:     3.14 cm  LEFT ATRIUM             Index        RIGHT ATRIUM           Index LA diam:        3.60 cm 2.49 cm/m   RA Area:     12.90 cm LA Vol (A2C):   23.9 ml 16.51 ml/m  RA Volume:   31.90 ml  22.03 ml/m LA Vol (A4C):   17.1 ml 11.81 ml/m LA Biplane Vol: 21.9 ml 15.13 ml/m  AORTIC VALVE AV Area (Vmax):    1.81 cm AV Area (Vmean):   1.77 cm AV Area (VTI):     1.87 cm AV Vmax:           112.00 cm/s AV Vmean:          77.000 cm/s AV VTI:            0.192 m AV Peak Grad:      5.0 mmHg AV Mean Grad:      3.0 mmHg LVOT Vmax:         64.50 cm/s LVOT Vmean:        43.500 cm/s LVOT VTI:  0.114 m LVOT/AV VTI ratio: 0.59  AORTA Ao Root diam: 2.90 cm MITRAL VALVE                TRICUSPID VALVE MV Area (PHT): 4.86 cm     TR Peak grad:   17.6 mmHg MV Decel Time: 156 msec     TR Vmax:        210.00 cm/s MV E velocity: 71.30 cm/s MV A velocity: 126.00 cm/s  SHUNTS MV E/A ratio:  0.57         Systemic VTI:  0.11 m                             Systemic Diam: 2.00 cm Kate Sable MD Electronically signed by Kate Sable MD Signature Date/Time: 01/31/2022/5:56:50 PM    Final    US Venous Img Lower Bilateral (DVT)  Result Date: 01/31/2022 CLINICAL DATA:  Recent surgery.  Elevated D-dimer. EXAM: BILATERAL LOWER EXTREMITY VENOUS DOPPLER ULTRASOUND TECHNIQUE: Gray-scale sonography with graded compression, as well as color Doppler and duplex ultrasound were performed to evaluate the lower extremity deep venous systems from the level of the common femoral vein and including the common femoral, femoral, profunda femoral, popliteal and calf veins including the posterior tibial, peroneal and gastrocnemius veins when visible. The superficial great saphenous vein was also interrogated. Spectral Doppler was utilized to evaluate flow at rest and with distal augmentation  maneuvers in the common femoral, femoral and popliteal veins. COMPARISON:  None Available. FINDINGS: RIGHT LOWER EXTREMITY Common Femoral Vein: No evidence of thrombus. Normal compressibility, respiratory phasicity and response to augmentation. Saphenofemoral Junction: No evidence of thrombus. Normal compressibility and flow on color Doppler imaging. Profunda Femoral Vein: No evidence of thrombus. Normal compressibility and flow on color Doppler imaging. Femoral Vein: No evidence of thrombus. Normal compressibility, respiratory phasicity and response to augmentation. Popliteal Vein: No evidence of thrombus. Normal compressibility, respiratory phasicity and response to augmentation. Calf Veins: Positive for thrombus. There is 1 peroneal vein that is not compressible and contains thrombus. The posterior tibial veins appear to be patent with normal compressibility. Other Findings:  None. LEFT LOWER EXTREMITY Common Femoral Vein: No evidence of thrombus. Normal compressibility, respiratory phasicity and response to augmentation. Saphenofemoral Junction: No evidence of thrombus. Normal compressibility and flow on color Doppler imaging. Profunda Femoral Vein: No evidence of thrombus. Normal compressibility and flow on color Doppler imaging. Femoral Vein: No evidence of thrombus. Normal compressibility, respiratory phasicity and response to augmentation. Popliteal Vein: No evidence of thrombus. Normal compressibility, respiratory phasicity and response to augmentation. Calf Veins: Positive for thrombus. There is a non compressible posterior tibial vein compatible with DVT. The second posterior tibial vein appears to be patent with normal compressibility. Left peroneal veins demonstrate normal compressibility and color Doppler flow. Other Findings:  None. IMPRESSION: 1. Positive for bilateral calf deep vein thrombosis. There is thrombus involving 1 right peroneal vein and 1 left posterior tibial vein. Electronically Signed    By: Markus Daft M.D.   On: 01/31/2022 09:45   MR ANGIO HEAD WO CONTRAST  Result Date: 01/31/2022 CLINICAL DATA:  81 year old female with neurologic deficit and patchy right greater than left hemisphere infarcts on MRI yesterday. EXAM: MRA HEAD WITHOUT CONTRAST TECHNIQUE: Angiographic images of the Circle of Willis were acquired using MRA technique without intravenous contrast. COMPARISON:  Brain MRI 01/30/2022. Neck MRA today reported separately. FINDINGS: Study is moderately degraded by motion artifact despite repeated imaging attempts.  Anterior circulation: Antegrade flow in both ICA siphons, and both carotid termini appear patent, but supraclinoid ICA detail is degraded. MCA and ACA origins appear patent, but otherwise the branch detail is degraded. Posterior circulation: Antegrade flow in the posterior circulation with dominant appearing distal left vertebral artery. No distal vertebral or vertebrobasilar junction stenosis. Patent basilar artery without evidence of stenosis, although there is decreased detail of the basilar tip. PCA origins are patent. A right posterior communicating artery is visible. P1 and P2 segments appear patent but otherwise PCA branch detail is degraded. Anatomic variants: None significant are apparent. Other: No intracranial mass effect or ventriculomegaly is evident. IMPRESSION: Limited intracranial MRA. Patent ICA siphons, and no evidence of a posterior circulation large vessel occlusion. But no other circle-of-Willis branch detail. Electronically Signed   By: Genevie Ann M.D.   On: 01/31/2022 09:20   MR ANGIO NECK WO CONTRAST  Result Date: 01/31/2022 CLINICAL DATA:  81 year old female with neurologic deficit and patchy right greater than left hemisphere infarcts on MRI yesterday. EXAM: MRA NECK WITHOUT CONTRAST TECHNIQUE: Angiographic images of the neck were acquired using MRA technique without intravenous contrast. Carotid stenosis measurements (when applicable) are obtained  utilizing NASCET criteria, using the distal internal carotid diameter as the denominator. COMPARISON:  Brain MRI 01/30/2022. FINDINGS: Study is moderately degraded by motion artifact despite repeated imaging attempts. Evidence of a 3 vessel arch configuration. Antegrade flow in both cervical carotid arteries throughout the neck and to the skull base. Degraded detail of both carotid bifurcations, but no carotid flow gaps to suggest high-grade stenosis. Similarly, degraded detail of both cervical vertebral arteries which demonstrate maintained antegrade flow signal to the skull base. IMPRESSION: Limited noncontrast neck MRA demonstrating patent cervical carotid and vertebral arteries. No high-grade stenosis is apparent. Electronically Signed   By: Genevie Ann M.D.   On: 01/31/2022 09:11   MR BRAIN W WO CONTRAST  Result Date: 01/30/2022 CLINICAL DATA:  Neuro deficit, acute, stroke suspected. EXAM: MRI HEAD WITHOUT AND WITH CONTRAST TECHNIQUE: Multiplanar, multiecho pulse sequences of the brain and surrounding structures were obtained without and with intravenous contrast. CONTRAST:  74m GADAVIST GADOBUTROL 1 MMOL/ML IV SOLN COMPARISON:  Head CT January 31, 2019. FINDINGS: The study is significantly degraded by motion. Brain: Areas of restricted diffusion are seen in the bilateral frontoparietal regions and right temporal lobe. The largest area of restricted diffusion is in the right parietal lobe where there is also interspersed areas of vasogenic edema with sulcal effacement. These areas show prominent hyperintensity on T2/FLAIR and are suggestive of subacute infarcts. No significant mass effect or midline shift. No evidence of hemorrhagic transformation. No acute hemorrhage, hydrocephalus, extra-axial collection or mass lesion. No evidence of abnormal focus of contrast enhancement, although postcontrast images are significantly degraded by motion. Vascular: Normal flow voids. Skull and upper cervical spine: Normal  marrow signal. Sinuses/Orbits: Negative. Other: None. IMPRESSION: Areas of restricted diffusion in the bilateral frontoparietal regions and right temporal lobe consistent with subacute infarcts. No evidence of hemorrhagic transformation or significant mass effect. Findings are concerning for embolic etiology. Electronically Signed   By: KPedro EarlsM.D.   On: 01/30/2022 16:42   NM Pulmonary Perfusion  Result Date: 01/30/2022 CLINICAL DATA:  Chest pain, fall EXAM: NUCLEAR MEDICINE PERFUSION LUNG SCAN TECHNIQUE: Perfusion images were obtained in multiple projections after intravenous injection of radiopharmaceutical. Ventilation scans intentionally deferred if perfusion scan and chest x-ray adequate for interpretation during COVID 19 epidemic. RADIOPHARMACEUTICALS:  4.24 mCi Tc-978mAA  IV COMPARISON:  Chest radiographs done earlier today FINDINGS: There are no discrete wedge-shaped perfusion defects. There are ill-defined areas of decreased uptake in mid and lower lung fields, more so in the left side. IMPRESSION: Low probability for pulmonary embolism. Electronically Signed   By: Elmer Picker M.D.   On: 01/30/2022 16:30               LOS: 8 days   Krystan Northrop  [facility]   Pager on [email or web address]. If 7PM-7AM, please contact night-coverage at [email or web address]     02/01/2022, 2:12 PM

## 2022-02-01 NOTE — TOC Progression Note (Signed)
Transition of Care [facility]) - Progression Note    Patient Details  Name: Katie Bell MRN: [id number] Date of Birth: 05/31/1940  Transition of Care [facility]) CM/SW Lorenzo, RN Phone Number: 02/01/2022, 2:33 PM  Clinical Narrative:    Ins approved to go to [facility] cre 02-01-09/16 ref number [id number] Josem Kaufmann ID [id number]  Next review 10/16   Expected Discharge Plan: [place] Barriers to Discharge: Insurance Authorization  Expected Discharge Plan and Services Expected Discharge Plan: [place] In-house Referral: Clinical Social Work                                             Social Determinants of Health (SDOH) Interventions    Readmission Risk Interventions     No data to display

## 2022-02-01 NOTE — Progress Notes (Signed)
ID is following for Klebsiella bacteremia and neurology is following given concern for multifocal subacute embolic infarcts.   Subjective:  [name] son is at bedside.  Patient is sitting up, comfortable.   Objective:   VITALS:   Vitals:   01/31/22 0315 01/31/22 0554 01/31/22 2302 02/01/22 0854  BP: 115/64 (!) 117/58 134/78 136/89  Pulse: 95 89 93 96  Resp: '20 20 16 16  '$ Temp: 98 F (36.7 C)  98.8 F (37.1 C) 97.6 F (36.4 C)  TempSrc:      SpO2: 99% 100% 97% 98%  Weight:      Height:        PHYSICAL EXAM:  General: Somewhat confused Right lower extremity, proximal dressing is saturated, distal dressing is dry.  Grossly motor and sensory intact  LABS  Results for orders placed or performed during the hospital encounter of 01/24/22 (from the past 24 hour(s))  CBC with Differential/Platelet     Status: Abnormal   Collection Time: 01/31/22  3:59 PM  Result Value Ref Range   WBC 46.8 (H) 4.0 - 10.5 K/uL   RBC 2.88 (L) 3.87 - 5.11 MIL/uL   Hemoglobin 9.3 (L) 12.0 - 15.0 g/dL   HCT 27.7 (L) 36.0 - 46.0 %   MCV 96.2 80.0 - 100.0 fL   MCH 32.3 26.0 - 34.0 pg   MCHC 33.6 30.0 - 36.0 g/dL   RDW 13.0 11.5 - 15.5 %   Platelets 100 (L) 150 - 400 K/uL   nRBC 0.0 0.0 - 0.2 %   Neutrophils Relative % 89 %   Neutro Abs 41.7 (H) 1.7 - 7.7 K/uL   Lymphocytes Relative 3 %   Lymphs Abs 1.3 0.7 - 4.0 K/uL   Monocytes Relative 2 %   Monocytes Absolute 1.0 0.1 - 1.0 K/uL   Eosinophils Relative 0 %   Eosinophils Absolute 0.1 0.0 - 0.5 K/uL   Basophils Relative 0 %   Basophils Absolute 0.2 (H) 0.0 - 0.1 K/uL   WBC Morphology DOHLE BODIES    RBC Morphology See Note    Smear Review Normal platelet morphology    Immature Granulocytes 6 %   Abs Immature Granulocytes 2.57 (H) 0.00 - 0.07 K/uL  CBC with Differential/Platelet     Status: Abnormal   Collection Time: 02/01/22  6:29 AM  Result Value Ref Range   WBC 36.0 (H) 4.0 - 10.5 K/uL   RBC 3.35 (L) 3.87 - 5.11 MIL/uL    Hemoglobin 10.8 (L) 12.0 - 15.0 g/dL   HCT 32.1 (L) 36.0 - 46.0 %   MCV 95.8 80.0 - 100.0 fL   MCH 32.2 26.0 - 34.0 pg   MCHC 33.6 30.0 - 36.0 g/dL   RDW 13.1 11.5 - 15.5 %   Platelets 114 (L) 150 - 400 K/uL   nRBC 0.0 0.0 - 0.2 %   Neutrophils Relative % 89 %   Neutro Abs 32.4 (H) 1.7 - 7.7 K/uL   Lymphocytes Relative 4 %   Lymphs Abs 1.4 0.7 - 4.0 K/uL   Monocytes Relative 2 %   Monocytes Absolute 0.6 0.1 - 1.0 K/uL   Eosinophils Relative 1 %   Eosinophils Absolute 0.2 0.0 - 0.5 K/uL   Basophils Relative 0 %   Basophils Absolute 0.1 0.0 - 0.1 K/uL   WBC Morphology MORPHOLOGY UNREMARKABLE    RBC Morphology MORPHOLOGY UNREMARKABLE    Smear Review Normal platelet morphology    Immature Granulocytes 4 %   Abs Immature Granulocytes 1.34 (  H) 0.00 - 0.07 K/uL  Comprehensive metabolic panel     Status: Abnormal   Collection Time: 02/01/22  6:29 AM  Result Value Ref Range   Sodium 146 (H) 135 - 145 mmol/L   Potassium 4.0 3.5 - 5.1 mmol/L   Chloride 119 (H) 98 - 111 mmol/L   CO2 17 (L) 22 - 32 mmol/L   Glucose, Bld 147 (H) 70 - 99 mg/dL   BUN 46 (H) 8 - 23 mg/dL   Creatinine, Ser 1.23 (H) 0.44 - 1.00 mg/dL   Calcium 8.4 (L) 8.9 - 10.3 mg/dL   Total Protein 6.0 (L) 6.5 - 8.1 g/dL   Albumin 2.7 (L) 3.5 - 5.0 g/dL   AST 25 15 - 41 U/L   ALT 12 0 - 44 U/L   Alkaline Phosphatase 84 38 - 126 U/L   Total Bilirubin 1.2 0.3 - 1.2 mg/dL   GFR, Estimated 44 (L) >60 mL/min   Anion gap 10 5 - 15  Magnesium     Status: None   Collection Time: 02/01/22  6:29 AM  Result Value Ref Range   Magnesium 2.1 1.7 - 2.4 mg/dL  Phosphorus     Status: None   Collection Time: 02/01/22  6:29 AM  Result Value Ref Range   Phosphorus 2.7 2.5 - 4.6 mg/dL    ECHOCARDIOGRAM COMPLETE BUBBLE STUDY  Result Date: 01/31/2022    ECHOCARDIOGRAM REPORT   Patient Name:   CALENE PARADISO Date of Exam: 01/31/2022 Medical Rec #:  [id number]        Height:       60.0 in Accession #:    [id number]       Weight:        110.0 lb Date of Birth:  08/17/1940         BSA:          1.448 m Patient Age:    73 years         BP:           117/58 mmHg Patient Gender: F                HR:           89 bpm. Exam Location:  [facility] Procedure: 2D Echo, Cardiac Doppler, Color Doppler and Saline Contrast Bubble            Study Indications:     Stroke 434.91 / I63.9  History:         Patient has no prior history of Echocardiogram examinations.                  Risk Factors:Hypertension.  Sonographer:     Sherrie Sport Referring Phys:  [id number] Sidney Ace Diagnosing Phys: Kate Sable MD IMPRESSIONS  1. Left ventricular ejection fraction, by estimation, is 55 to 60%. The left ventricle has normal function. The left ventricle has no regional wall motion abnormalities. Left ventricular diastolic parameters are indeterminate.  2. Right ventricular systolic function is normal. The right ventricular size is normal.  3. The mitral valve is normal in structure. No evidence of mitral valve regurgitation.  4. The aortic valve is tricuspid. Aortic valve regurgitation is not visualized. Aortic valve sclerosis is present, with no evidence of aortic valve stenosis.  5. The inferior vena cava is normal in size with greater than 50% respiratory variability, suggesting right atrial pressure of 3 mmHg.  6. Agitated saline contrast bubble study was negative, with no evidence  of any interatrial shunt. FINDINGS  Left Ventricle: Left ventricular ejection fraction, by estimation, is 55 to 60%. The left ventricle has normal function. The left ventricle has no regional wall motion abnormalities. The left ventricular internal cavity size was normal in size. There is  no left ventricular hypertrophy. Left ventricular diastolic parameters are indeterminate. Right Ventricle: The right ventricular size is normal. No increase in right ventricular wall thickness. Right ventricular systolic function is normal. Left Atrium: Left atrial size was normal in size. Right Atrium:  Right atrial size was normal in size. Pericardium: There is no evidence of pericardial effusion. Mitral Valve: The mitral valve is normal in structure. No evidence of mitral valve regurgitation. Tricuspid Valve: The tricuspid valve is normal in structure. Tricuspid valve regurgitation is trivial. Aortic Valve: The aortic valve is tricuspid. Aortic valve regurgitation is not visualized. Aortic valve sclerosis is present, with no evidence of aortic valve stenosis. Aortic valve mean gradient measures 3.0 mmHg. Aortic valve peak gradient measures 5.0  mmHg. Aortic valve area, by VTI measures 1.87 cm. Pulmonic Valve: The pulmonic valve was not well visualized. Pulmonic valve regurgitation is not visualized. Aorta: The aortic root is normal in size and structure. Venous: The inferior vena cava is normal in size with greater than 50% respiratory variability, suggesting right atrial pressure of 3 mmHg. IAS/Shunts: The interatrial septum appears to be lipomatous. No atrial level shunt detected by color flow Doppler. Agitated saline contrast was given intravenously to evaluate for intracardiac shunting. Agitated saline contrast bubble study was negative,  with no evidence of any interatrial shunt.  LEFT VENTRICLE PLAX 2D LVIDd:         3.60 cm   Diastology LVIDs:         2.20 cm   LV e' medial:    10.70 cm/s LV PW:         1.00 cm   LV E/e' medial:  6.7 LV IVS:        1.10 cm   LV e' lateral:   10.80 cm/s LVOT diam:     2.00 cm   LV E/e' lateral: 6.6 LV SV:         36 LV SV Index:   25 LVOT Area:     3.14 cm  LEFT ATRIUM             Index        RIGHT ATRIUM           Index LA diam:        3.60 cm 2.49 cm/m   RA Area:     12.90 cm LA Vol (A2C):   23.9 ml 16.51 ml/m  RA Volume:   31.90 ml  22.03 ml/m LA Vol (A4C):   17.1 ml 11.81 ml/m LA Biplane Vol: 21.9 ml 15.13 ml/m  AORTIC VALVE AV Area (Vmax):    1.81 cm AV Area (Vmean):   1.77 cm AV Area (VTI):     1.87 cm AV Vmax:           112.00 cm/s AV Vmean:           77.000 cm/s AV VTI:            0.192 m AV Peak Grad:      5.0 mmHg AV Mean Grad:      3.0 mmHg LVOT Vmax:         64.50 cm/s LVOT Vmean:        43.500 cm/s LVOT VTI:  0.114 m LVOT/AV VTI ratio: 0.59  AORTA Ao Root diam: 2.90 cm MITRAL VALVE                TRICUSPID VALVE MV Area (PHT): 4.86 cm     TR Peak grad:   17.6 mmHg MV Decel Time: 156 msec     TR Vmax:        210.00 cm/s MV E velocity: 71.30 cm/s MV A velocity: 126.00 cm/s  SHUNTS MV E/A ratio:  0.57         Systemic VTI:  0.11 m                             Systemic Diam: 2.00 cm Kate Sable MD Electronically signed by Kate Sable MD Signature Date/Time: 01/31/2022/5:56:50 PM    Final    US Venous Img Lower Bilateral (DVT)  Result Date: 01/31/2022 CLINICAL DATA:  Recent surgery.  Elevated D-dimer. EXAM: BILATERAL LOWER EXTREMITY VENOUS DOPPLER ULTRASOUND TECHNIQUE: Gray-scale sonography with graded compression, as well as color Doppler and duplex ultrasound were performed to evaluate the lower extremity deep venous systems from the level of the common femoral vein and including the common femoral, femoral, profunda femoral, popliteal and calf veins including the posterior tibial, peroneal and gastrocnemius veins when visible. The superficial great saphenous vein was also interrogated. Spectral Doppler was utilized to evaluate flow at rest and with distal augmentation maneuvers in the common femoral, femoral and popliteal veins. COMPARISON:  None Available. FINDINGS: RIGHT LOWER EXTREMITY Common Femoral Vein: No evidence of thrombus. Normal compressibility, respiratory phasicity and response to augmentation. Saphenofemoral Junction: No evidence of thrombus. Normal compressibility and flow on color Doppler imaging. Profunda Femoral Vein: No evidence of thrombus. Normal compressibility and flow on color Doppler imaging. Femoral Vein: No evidence of thrombus. Normal compressibility, respiratory phasicity and response to augmentation.  Popliteal Vein: No evidence of thrombus. Normal compressibility, respiratory phasicity and response to augmentation. Calf Veins: Positive for thrombus. There is 1 peroneal vein that is not compressible and contains thrombus. The posterior tibial veins appear to be patent with normal compressibility. Other Findings:  None. LEFT LOWER EXTREMITY Common Femoral Vein: No evidence of thrombus. Normal compressibility, respiratory phasicity and response to augmentation. Saphenofemoral Junction: No evidence of thrombus. Normal compressibility and flow on color Doppler imaging. Profunda Femoral Vein: No evidence of thrombus. Normal compressibility and flow on color Doppler imaging. Femoral Vein: No evidence of thrombus. Normal compressibility, respiratory phasicity and response to augmentation. Popliteal Vein: No evidence of thrombus. Normal compressibility, respiratory phasicity and response to augmentation. Calf Veins: Positive for thrombus. There is a non compressible posterior tibial vein compatible with DVT. The second posterior tibial vein appears to be patent with normal compressibility. Left peroneal veins demonstrate normal compressibility and color Doppler flow. Other Findings:  None. IMPRESSION: 1. Positive for bilateral calf deep vein thrombosis. There is thrombus involving 1 right peroneal vein and 1 left posterior tibial vein. Electronically Signed   By: Markus Daft M.D.   On: 01/31/2022 09:45   MR ANGIO HEAD WO CONTRAST  Result Date: 01/31/2022 CLINICAL DATA:  81 year old female with neurologic deficit and patchy right greater than left hemisphere infarcts on MRI yesterday. EXAM: MRA HEAD WITHOUT CONTRAST TECHNIQUE: Angiographic images of the Circle of Willis were acquired using MRA technique without intravenous contrast. COMPARISON:  Brain MRI 01/30/2022. Neck MRA today reported separately. FINDINGS: Study is moderately degraded by motion artifact despite repeated imaging attempts. Anterior  circulation:  Antegrade flow in both ICA siphons, and both carotid termini appear patent, but supraclinoid ICA detail is degraded. MCA and ACA origins appear patent, but otherwise the branch detail is degraded. Posterior circulation: Antegrade flow in the posterior circulation with dominant appearing distal left vertebral artery. No distal vertebral or vertebrobasilar junction stenosis. Patent basilar artery without evidence of stenosis, although there is decreased detail of the basilar tip. PCA origins are patent. A right posterior communicating artery is visible. P1 and P2 segments appear patent but otherwise PCA branch detail is degraded. Anatomic variants: None significant are apparent. Other: No intracranial mass effect or ventriculomegaly is evident. IMPRESSION: Limited intracranial MRA. Patent ICA siphons, and no evidence of a posterior circulation large vessel occlusion. But no other circle-of-Willis branch detail. Electronically Signed   By: Genevie Ann M.D.   On: 01/31/2022 09:20   MR ANGIO NECK WO CONTRAST  Result Date: 01/31/2022 CLINICAL DATA:  81 year old female with neurologic deficit and patchy right greater than left hemisphere infarcts on MRI yesterday. EXAM: MRA NECK WITHOUT CONTRAST TECHNIQUE: Angiographic images of the neck were acquired using MRA technique without intravenous contrast. Carotid stenosis measurements (when applicable) are obtained utilizing NASCET criteria, using the distal internal carotid diameter as the denominator. COMPARISON:  Brain MRI 01/30/2022. FINDINGS: Study is moderately degraded by motion artifact despite repeated imaging attempts. Evidence of a 3 vessel arch configuration. Antegrade flow in both cervical carotid arteries throughout the neck and to the skull base. Degraded detail of both carotid bifurcations, but no carotid flow gaps to suggest high-grade stenosis. Similarly, degraded detail of both cervical vertebral arteries which demonstrate maintained antegrade flow signal to  the skull base. IMPRESSION: Limited noncontrast neck MRA demonstrating patent cervical carotid and vertebral arteries. No high-grade stenosis is apparent. Electronically Signed   By: Genevie Ann M.D.   On: 01/31/2022 09:11   MR BRAIN W WO CONTRAST  Result Date: 01/30/2022 CLINICAL DATA:  Neuro deficit, acute, stroke suspected. EXAM: MRI HEAD WITHOUT AND WITH CONTRAST TECHNIQUE: Multiplanar, multiecho pulse sequences of the brain and surrounding structures were obtained without and with intravenous contrast. CONTRAST:  62m GADAVIST GADOBUTROL 1 MMOL/ML IV SOLN COMPARISON:  Head CT January 31, 2019. FINDINGS: The study is significantly degraded by motion. Brain: Areas of restricted diffusion are seen in the bilateral frontoparietal regions and right temporal lobe. The largest area of restricted diffusion is in the right parietal lobe where there is also interspersed areas of vasogenic edema with sulcal effacement. These areas show prominent hyperintensity on T2/FLAIR and are suggestive of subacute infarcts. No significant mass effect or midline shift. No evidence of hemorrhagic transformation. No acute hemorrhage, hydrocephalus, extra-axial collection or mass lesion. No evidence of abnormal focus of contrast enhancement, although postcontrast images are significantly degraded by motion. Vascular: Normal flow voids. Skull and upper cervical spine: Normal marrow signal. Sinuses/Orbits: Negative. Other: None. IMPRESSION: Areas of restricted diffusion in the bilateral frontoparietal regions and right temporal lobe consistent with subacute infarcts. No evidence of hemorrhagic transformation or significant mass effect. Findings are concerning for embolic etiology. Electronically Signed   By: KPedro EarlsM.D.   On: 01/30/2022 16:42   NM Pulmonary Perfusion  Result Date: 01/30/2022 CLINICAL DATA:  Chest pain, fall EXAM: NUCLEAR MEDICINE PERFUSION LUNG SCAN TECHNIQUE: Perfusion images were obtained in  multiple projections after intravenous injection of radiopharmaceutical. Ventilation scans intentionally deferred if perfusion scan and chest x-ray adequate for interpretation during COVID 19 epidemic. RADIOPHARMACEUTICALS:  4.24 mCi Tc-971mAA  IV COMPARISON:  Chest radiographs done earlier today FINDINGS: There are no discrete wedge-shaped perfusion defects. There are ill-defined areas of decreased uptake in mid and lower lung fields, more so in the left side. IMPRESSION: Low probability for pulmonary embolism. Electronically Signed   By: Elmer Picker M.D.   On: 01/30/2022 16:30    Assessment/Plan: 7 Days Post-Op   Status post right hip IM nail  Principal Problem:   Closed right femoral fracture (HCC) Active Problems:   Hypertension   Severe sepsis ([place])   Leucocytosis   Fall at home, initial encounter   Bacteremia due to Klebsiella pneumoniae   CVA (cerebral vascular accident) ([place])  Will ask nursing to change right hip dressings. PT/OT: Weight-bear as tolerated DVT prophylaxis: Per neurology Follow-up in clinic in 2 weeks for staple removal and x-rays   Renee Harder , MD 02/01/2022, 12:59 PM

## 2022-02-01 NOTE — Progress Notes (Signed)
Physical Therapy Treatment Patient Details Name: Katie Bell MRN: [id number] DOB: [birth year] Today's Date: 02/01/2022   History of Present Illness Alexia Dinger is an 72yoF who comes to [facility] on 10/4 after a fall, scans revealing of right intertrochanteric femur fracture. PMH: HTN, GAD, basal cell carcinoma, GERD, renal lithiasis. Pt taken to theatre under direction of Renee Harder for IM nail fixation, then made WBAT. At baseline pt lives with son, most household AMB without device, still drives occasionally, no recent balance or falls issues. Rapid response was called on 10/10, pt found to have acute/subacute infarcts. Additional workup revealing for urinary retention and bacteremia.    PT Comments    Pt awake on entry, working with NA. Pt reports to recognize author, but cannot give recall of author's role nor what we worked on yesterday. Pt perseverative regarding desire for PO liquids, oriented to NPO status and previous day's aspiration multiple times but short term memory deficits render these attempt futile. Pt very anxious about mobility today, fealful of falling, but is verbally emboldened to come to EOB with intermittent cues for best posturing. Pt able to sit EOB for a while with good trunk control, no LOB, good confidence. Did not attempt OOB per MD recommendation in the setting of new DVT bilat and inability to commence anticoagulation. Chief Strategy Officer started a conversation with Pharmacy, attending, and neuro for additional clearance/restrictions for PT/OT.    Recommendations for follow up therapy are one component of a multi-disciplinary discharge planning process, led by the attending physician.  Recommendations may be updated based on patient status, additional functional criteria and insurance authorization.  Follow Up Recommendations  Skilled nursing-short term rehab (<3 hours/day) Can patient physically be transported by private vehicle: No   Assistance Recommended at Discharge  Frequent or constant Supervision/Assistance  Patient can return home with the following Two people to help with walking and/or transfers;Two people to help with bathing/dressing/bathroom;Direct supervision/assist for medications management;Direct supervision/assist for financial management;Assist for transportation;Assistance with cooking/housework;Assistance with feeding;Help with stairs or ramp for entrance   Equipment Recommendations  None recommended by PT    Recommendations for Other Services       Precautions / Restrictions Precautions Precautions: Fall Restrictions Weight Bearing Restrictions: No RLE Weight Bearing: Weight bearing as tolerated     Mobility  Bed Mobility Overal bed mobility: Needs Assistance Bed Mobility: Sit to Supine, Supine to Sit     Supine to sit: Min assist Sit to supine: Max assist        Transfers Overall transfer level:  (deferred; new DVT, MD asking for bed-level sessions until [facility] can be commenced)                      Ambulation/Gait                   Stairs             Wheelchair Mobility    Modified Rankin (Stroke Patients Only)       Balance                                            Cognition Arousal/Alertness: Awake/alert Behavior During Therapy: Restless, Anxious, Impulsive Overall Cognitive Status: Impaired/Different from baseline                   Orientation Level: Disoriented to, Place, Time,  Situation Current Attention Level: Alternating Memory: Decreased recall of precautions, Decreased short-term memory Following Commands: Follows one step commands with increased time Safety/Judgement: Decreased awareness of safety, Decreased awareness of deficits Awareness: Emergent, Anticipatory   General Comments: first day of pt beign very anxious about mobility, falls concerns        Exercises Other Exercises Other Exercises: seated EOB x20 minutes    General  Comments        Pertinent Vitals/Pain Pain Assessment Pain Assessment:  (c/o pain from catheter placement, mentation makes report difficult)    Home Living                          Prior Function            PT Goals (current goals can now be found in the care plan section) Acute Rehab PT Goals Patient Stated Goal: have pt regain independent AMB PT Goal Formulation: With family Time For Goal Achievement: 02/14/22 Potential to Achieve Goals: Fair Progress towards PT goals: Progressing toward goals    Frequency    7X/week      PT Plan Current plan remains appropriate    Co-evaluation              AM-PAC PT "6 Clicks" Mobility   Outcome Measure  Help needed turning from your back to your side while in a flat bed without using bedrails?: A Little Help needed moving from lying on your back to sitting on the side of a flat bed without using bedrails?: A Little Help needed moving to and from a bed to a chair (including a wheelchair)?: A Lot Help needed standing up from a chair using your arms (e.g., wheelchair or bedside chair)?: A Lot Help needed to walk in hospital room?: Total Help needed climbing 3-5 steps with a railing? : Total 6 Click Score: 12    End of Session   Activity Tolerance: Patient tolerated treatment well;Treatment limited secondary to medical complications (Comment) Patient left: in bed;with bed alarm set;with call bell/phone within reach Nurse Communication: Mobility status PT Visit Diagnosis: Muscle weakness (generalized) (M62.81);History of falling (Z91.81)     Time: 7494-4967 PT Time Calculation (min) (ACUTE ONLY): 24 min  Charges:  $Therapeutic Activity: 23-37 mins                    1:11 PM, 02/01/22 Etta Grandchild, PT, DPT Physical Therapist - [facility]  [phone] ([place])     Essynce Munsch C 02/01/2022, 1:05 PM

## 2022-02-01 NOTE — Progress Notes (Signed)
Pt did not void during shift.  She was assisted to bsc and remained there for approximately 20 minutes with water running and cueing to assist with possible voiding (pt did have a BM).  Pt bladder scanned for 545 ml.  On called provider notified.  I/O cath ordered with a 650 ml output.

## 2022-02-01 NOTE — TOC Progression Note (Signed)
Transition of Care [facility]) - Progression Note    Patient Details  Name: Katie Bell MRN: [id number] Date of Birth: 1940-08-08  Transition of [facility]) CM/SW [place], RN Phone Number: 02/01/2022, 12:17 PM  Clinical Narrative:   Ins Josem Kaufmann is still pending    Expected Discharge Plan: [place] Barriers to Discharge: Continued Medical Work up  Expected Discharge Plan and Services Expected Discharge Plan: [place] In-house Referral: Clinical Social Work                                             Social Determinants of Health (SDOH) Interventions    Readmission Risk Interventions     No data to display

## 2022-02-01 NOTE — Care Management Important Message (Signed)
Important Message  Patient Details  Name: Katie Bell MRN: [id number] Date of Birth: [birth year]   Medicare Important Message Given:  Yes     [name] 02/01/2022, 2:45 PM

## 2022-02-01 NOTE — Progress Notes (Signed)
Date of Admission:  01/24/2022     ID: Katie Bell is a 81 y.o. female  Principal Problem:   Closed right femoral fracture ([place]) Active Problems:   Hypertension   Severe sepsis ([place])   Leucocytosis   Fall at home, initial encounter   Bacteremia due to Klebsiella pneumoniae   CVA (cerebral vascular accident) ([place])   AKI (acute kidney injury) ([place])   Hypokalemia    Subjective: Pt is confused  Medications:   aspirin  150 mg Rectal Daily   clopidogrel  75 mg Oral Daily   docusate sodium  100 mg Oral BID   enoxaparin (LOVENOX) injection  30 mg Subcutaneous Daily   feeding supplement  237 mL Oral BID BM   multivitamin with minerals  1 tablet Oral Daily   pantoprazole  40 mg Oral Daily   polyethylene glycol  17 g Oral Daily    Objective: Vital signs in last 24 hours: Temp:  [97.6 F (36.4 C)-98.8 F (37.1 C)] 97.6 F (36.4 C) (10/12 0854) Pulse Rate:  [93-96] 96 (10/12 0854) Resp:  [16] 16 (10/12 0854) BP: (134-136)/(78-89) 136/89 (10/12 0854) SpO2:  [97 %-98 %] 98 % (10/12 0854)  PHYSICAL EXAM:  General: awake, confused, dysarthria, follows some commands Left facial palsy Lungs: b/l air entry Heart: s1s2 Abdomen: Soft, non-tender,not distended. Bowel sounds normal. No masses Extremities: atraumatic, no cyanosis. No edema. No clubbing Skin: No rashes or lesions. Or bruising Lymph: Cervical, supraclavicular normal. Neurologic: left sided weakness Rt thigh ecchymosis  Lab Results Recent Labs    01/31/22 0541 01/31/22 1559 02/01/22 0629  WBC 52.3* 46.8* 36.0*  HGB 8.7* 9.3* 10.8*  HCT 26.0* 27.7* 32.1*  NA 141  --  146*  K 3.9  --  4.0  CL 114*  --  119*  CO2 18*  --  17*  BUN 54*  --  46*  CREATININE 2.14*  --  1.23*   Liver Panel Recent Labs    02/01/22 0629  PROT 6.0*  ALBUMIN 2.7*  AST 25  ALT 12  ALKPHOS 3  BILITOT 1.2  Microbiology: BC- klebsiella  Studies/Results: ECHOCARDIOGRAM COMPLETE BUBBLE STUDY  Result Date:  01/31/2022    ECHOCARDIOGRAM REPORT   Patient Name:   Katie Bell Date of Exam: 01/31/2022 Medical Rec #:  [id number]        Height:       60.0 in Accession #:    [id number]       Weight:       110.0 lb Date of Birth:  July 24, 1940         BSA:          1.448 m Patient Age:    41 years         BP:           117/58 mmHg Patient Gender: F                HR:           89 bpm. Exam Location:  [facility] Procedure: 2D Echo, Cardiac Doppler, Color Doppler and Saline Contrast Bubble            Study Indications:     Stroke 434.91 / I63.9  History:         Patient has no prior history of Echocardiogram examinations.                  Risk Factors:Hypertension.  Sonographer:     Sonia Side  Hege Referring Phys:  [id number] Sidney Ace Diagnosing Phys: Kate Sable MD IMPRESSIONS  1. Left ventricular ejection fraction, by estimation, is 55 to 60%. The left ventricle has normal function. The left ventricle has no regional wall motion abnormalities. Left ventricular diastolic parameters are indeterminate.  2. Right ventricular systolic function is normal. The right ventricular size is normal.  3. The mitral valve is normal in structure. No evidence of mitral valve regurgitation.  4. The aortic valve is tricuspid. Aortic valve regurgitation is not visualized. Aortic valve sclerosis is present, with no evidence of aortic valve stenosis.  5. The inferior vena cava is normal in size with greater than 50% respiratory variability, suggesting right atrial pressure of 3 mmHg.  6. Agitated saline contrast bubble study was negative, with no evidence of any interatrial shunt. FINDINGS  Left Ventricle: Left ventricular ejection fraction, by estimation, is 55 to 60%. The left ventricle has normal function. The left ventricle has no regional wall motion abnormalities. The left ventricular internal cavity size was normal in size. There is  no left ventricular hypertrophy. Left ventricular diastolic parameters are indeterminate. Right Ventricle:  The right ventricular size is normal. No increase in right ventricular wall thickness. Right ventricular systolic function is normal. Left Atrium: Left atrial size was normal in size. Right Atrium: Right atrial size was normal in size. Pericardium: There is no evidence of pericardial effusion. Mitral Valve: The mitral valve is normal in structure. No evidence of mitral valve regurgitation. Tricuspid Valve: The tricuspid valve is normal in structure. Tricuspid valve regurgitation is trivial. Aortic Valve: The aortic valve is tricuspid. Aortic valve regurgitation is not visualized. Aortic valve sclerosis is present, with no evidence of aortic valve stenosis. Aortic valve mean gradient measures 3.0 mmHg. Aortic valve peak gradient measures 5.0  mmHg. Aortic valve area, by VTI measures 1.87 cm. Pulmonic Valve: The pulmonic valve was not well visualized. Pulmonic valve regurgitation is not visualized. Aorta: The aortic root is normal in size and structure. Venous: The inferior vena cava is normal in size with greater than 50% respiratory variability, suggesting right atrial pressure of 3 mmHg. IAS/Shunts: The interatrial septum appears to be lipomatous. No atrial level shunt detected by color flow Doppler. Agitated saline contrast was given intravenously to evaluate for intracardiac shunting. Agitated saline contrast bubble study was negative,  with no evidence of any interatrial shunt.  LEFT VENTRICLE PLAX 2D LVIDd:         3.60 cm   Diastology LVIDs:         2.20 cm   LV e' medial:    10.70 cm/s LV PW:         1.00 cm   LV E/e' medial:  6.7 LV IVS:        1.10 cm   LV e' lateral:   10.80 cm/s LVOT diam:     2.00 cm   LV E/e' lateral: 6.6 LV SV:         36 LV SV Index:   25 LVOT Area:     3.14 cm  LEFT ATRIUM             Index        RIGHT ATRIUM           Index LA diam:        3.60 cm 2.49 cm/m   RA Area:     12.90 cm LA Vol (A2C):   23.9 ml 16.51 ml/m  RA Volume:   31.90 ml  22.03 ml/m LA Vol (A4C):   17.1 ml  11.81 ml/m LA Biplane Vol: 21.9 ml 15.13 ml/m  AORTIC VALVE AV Area (Vmax):    1.81 cm AV Area (Vmean):   1.77 cm AV Area (VTI):     1.87 cm AV Vmax:           112.00 cm/s AV Vmean:          77.000 cm/s AV VTI:            0.192 m AV Peak Grad:      5.0 mmHg AV Mean Grad:      3.0 mmHg LVOT Vmax:         64.50 cm/s LVOT Vmean:        43.500 cm/s LVOT VTI:          0.114 m LVOT/AV VTI ratio: 0.59  AORTA Ao Root diam: 2.90 cm MITRAL VALVE                TRICUSPID VALVE MV Area (PHT): 4.86 cm     TR Peak grad:   17.6 mmHg MV Decel Time: 156 msec     TR Vmax:        210.00 cm/s MV E velocity: 71.30 cm/s MV A velocity: 126.00 cm/s  SHUNTS MV E/A ratio:  0.57         Systemic VTI:  0.11 m                             Systemic Diam: 2.00 cm Kate Sable MD Electronically signed by Kate Sable MD Signature Date/Time: 01/31/2022/5:56:50 PM    Final    US Venous Img Lower Bilateral (DVT)  Result Date: 01/31/2022 CLINICAL DATA:  Recent surgery.  Elevated D-dimer. EXAM: BILATERAL LOWER EXTREMITY VENOUS DOPPLER ULTRASOUND TECHNIQUE: Gray-scale sonography with graded compression, as well as color Doppler and duplex ultrasound were performed to evaluate the lower extremity deep venous systems from the level of the common femoral vein and including the common femoral, femoral, profunda femoral, popliteal and calf veins including the posterior tibial, peroneal and gastrocnemius veins when visible. The superficial great saphenous vein was also interrogated. Spectral Doppler was utilized to evaluate flow at rest and with distal augmentation maneuvers in the common femoral, femoral and popliteal veins. COMPARISON:  None Available. FINDINGS: RIGHT LOWER EXTREMITY Common Femoral Vein: No evidence of thrombus. Normal compressibility, respiratory phasicity and response to augmentation. Saphenofemoral Junction: No evidence of thrombus. Normal compressibility and flow on color Doppler imaging. Profunda Femoral Vein: No  evidence of thrombus. Normal compressibility and flow on color Doppler imaging. Femoral Vein: No evidence of thrombus. Normal compressibility, respiratory phasicity and response to augmentation. Popliteal Vein: No evidence of thrombus. Normal compressibility, respiratory phasicity and response to augmentation. Calf Veins: Positive for thrombus. There is 1 peroneal vein that is not compressible and contains thrombus. The posterior tibial veins appear to be patent with normal compressibility. Other Findings:  None. LEFT LOWER EXTREMITY Common Femoral Vein: No evidence of thrombus. Normal compressibility, respiratory phasicity and response to augmentation. Saphenofemoral Junction: No evidence of thrombus. Normal compressibility and flow on color Doppler imaging. Profunda Femoral Vein: No evidence of thrombus. Normal compressibility and flow on color Doppler imaging. Femoral Vein: No evidence of thrombus. Normal compressibility, respiratory phasicity and response to augmentation. Popliteal Vein: No evidence of thrombus. Normal compressibility, respiratory phasicity and response to augmentation. Calf Veins: Positive for thrombus. There is a non compressible posterior  tibial vein compatible with DVT. The second posterior tibial vein appears to be patent with normal compressibility. Left peroneal veins demonstrate normal compressibility and color Doppler flow. Other Findings:  None. IMPRESSION: 1. Positive for bilateral calf deep vein thrombosis. There is thrombus involving 1 right peroneal vein and 1 left posterior tibial vein. Electronically Signed   By: Markus Daft M.D.   On: 01/31/2022 09:45   MR ANGIO HEAD WO CONTRAST  Result Date: 01/31/2022 CLINICAL DATA:  81 year old female with neurologic deficit and patchy right greater than left hemisphere infarcts on MRI yesterday. EXAM: MRA HEAD WITHOUT CONTRAST TECHNIQUE: Angiographic images of the Circle of Willis were acquired using MRA technique without intravenous  contrast. COMPARISON:  Brain MRI 01/30/2022. Neck MRA today reported separately. FINDINGS: Study is moderately degraded by motion artifact despite repeated imaging attempts. Anterior circulation: Antegrade flow in both ICA siphons, and both carotid termini appear patent, but supraclinoid ICA detail is degraded. MCA and ACA origins appear patent, but otherwise the branch detail is degraded. Posterior circulation: Antegrade flow in the posterior circulation with dominant appearing distal left vertebral artery. No distal vertebral or vertebrobasilar junction stenosis. Patent basilar artery without evidence of stenosis, although there is decreased detail of the basilar tip. PCA origins are patent. A right posterior communicating artery is visible. P1 and P2 segments appear patent but otherwise PCA branch detail is degraded. Anatomic variants: None significant are apparent. Other: No intracranial mass effect or ventriculomegaly is evident. IMPRESSION: Limited intracranial MRA. Patent ICA siphons, and no evidence of a posterior circulation large vessel occlusion. But no other circle-of-Willis branch detail. Electronically Signed   By: Genevie Ann M.D.   On: 01/31/2022 09:20   MR ANGIO NECK WO CONTRAST  Result Date: 01/31/2022 CLINICAL DATA:  81 year old female with neurologic deficit and patchy right greater than left hemisphere infarcts on MRI yesterday. EXAM: MRA NECK WITHOUT CONTRAST TECHNIQUE: Angiographic images of the neck were acquired using MRA technique without intravenous contrast. Carotid stenosis measurements (when applicable) are obtained utilizing NASCET criteria, using the distal internal carotid diameter as the denominator. COMPARISON:  Brain MRI 01/30/2022. FINDINGS: Study is moderately degraded by motion artifact despite repeated imaging attempts. Evidence of a 3 vessel arch configuration. Antegrade flow in both cervical carotid arteries throughout the neck and to the skull base. Degraded detail of both  carotid bifurcations, but no carotid flow gaps to suggest high-grade stenosis. Similarly, degraded detail of both cervical vertebral arteries which demonstrate maintained antegrade flow signal to the skull base. IMPRESSION: Limited noncontrast neck MRA demonstrating patent cervical carotid and vertebral arteries. No high-grade stenosis is apparent. Electronically Signed   By: Genevie Ann M.D.   On: 01/31/2022 09:11   MR BRAIN W WO CONTRAST  Result Date: 01/30/2022 CLINICAL DATA:  Neuro deficit, acute, stroke suspected. EXAM: MRI HEAD WITHOUT AND WITH CONTRAST TECHNIQUE: Multiplanar, multiecho pulse sequences of the brain and surrounding structures were obtained without and with intravenous contrast. CONTRAST:  74m GADAVIST GADOBUTROL 1 MMOL/ML IV SOLN COMPARISON:  Head CT January 31, 2019. FINDINGS: The study is significantly degraded by motion. Brain: Areas of restricted diffusion are seen in the bilateral frontoparietal regions and right temporal lobe. The largest area of restricted diffusion is in the right parietal lobe where there is also interspersed areas of vasogenic edema with sulcal effacement. These areas show prominent hyperintensity on T2/FLAIR and are suggestive of subacute infarcts. No significant mass effect or midline shift. No evidence of hemorrhagic transformation. No acute hemorrhage, hydrocephalus, extra-axial collection  or mass lesion. No evidence of abnormal focus of contrast enhancement, although postcontrast images are significantly degraded by motion. Vascular: Normal flow voids. Skull and upper cervical spine: Normal marrow signal. Sinuses/Orbits: Negative. Other: None. IMPRESSION: Areas of restricted diffusion in the bilateral frontoparietal regions and right temporal lobe consistent with subacute infarcts. No evidence of hemorrhagic transformation or significant mass effect. Findings are concerning for embolic etiology. Electronically Signed   By: Pedro Earls M.D.    On: 01/30/2022 16:42   NM Pulmonary Perfusion  Result Date: 01/30/2022 CLINICAL DATA:  Chest pain, fall EXAM: NUCLEAR MEDICINE PERFUSION LUNG SCAN TECHNIQUE: Perfusion images were obtained in multiple projections after intravenous injection of radiopharmaceutical. Ventilation scans intentionally deferred if perfusion scan and chest x-ray adequate for interpretation during COVID 19 epidemic. RADIOPHARMACEUTICALS:  4.24 mCi Tc-26mMAA IV COMPARISON:  Chest radiographs done earlier today FINDINGS: There are no discrete wedge-shaped perfusion defects. There are ill-defined areas of decreased uptake in mid and lower lung fields, more so in the left side. IMPRESSION: Low probability for pulmonary embolism. Electronically Signed   By: PElmer PickerM.D.   On: 01/30/2022 16:30     Assessment/Plan: ?Klebsiella bacteremia -likely Hospital acquired- no blood culture was done on admission Source- unclear- UTI ruled out - foley was there for a few hours for surgery and was removed- today it was replaced due to urinary retention Surgical site to be observed closely Continue Ceftriaxone Repeat blood culture because of bvery high wbc   Fall- with fracture rt femur - s/p intertrochanteric nailing CT head on admission did not show any stroke     CVA-  She will need TEE to r/o embolic source from valve /endocarditis   Severe leucocytosis- leukemoid reaction Could it be from ecchymosis, b/l popliteal vein DVT or other cause   Anemia  Thrombocytopenia  Discussed the management with the care team

## 2022-02-01 NOTE — Evaluation (Signed)
Clinical/Bedside Swallow Evaluation Patient Details  Name: Katie Bell MRN: [id number] Date of Birth: June 26, 1940  Today's Date: 02/01/2022 Time: SLP Start Time (ACUTE ONLY): 1140 SLP Stop Time (ACUTE ONLY): 1220 SLP Time Calculation (min) (ACUTE ONLY): 40 min  Past Medical History:  Past Medical History:  Diagnosis Date   Anxiety    Cancer ([place]) 2015   basal cell removed from leg   Depression    Episodic mood disorder ([place])    mostly anxiety--depression in the past   Gastric ulcer    History   GERD (gastroesophageal reflux disease)    History of blood transfusion    At [facility] appro 15 yrs ago   History of hiatal hernia    History of kidney stones 1960   Hypertension    Malignant melanoma ([place]) 07/14/2014   Left proximal post lat thigh. Breslow's 0.18m, Anatomic level II. Excised 08/03/2014   Osteoarthritis, hand    hand   Postmenopausal disorder    SVD (spontaneous vaginal delivery)    x 3   Past Surgical History:  Past Surgical History:  Procedure Laterality Date   CATARACT EXTRACTION W/PHACO Right 02/12/2019   Procedure: CATARACT EXTRACTION PHACO AND INTRAOCULAR LENS PLACEMENT ([place];  Surgeon: HMarchia Meiers MD;  Location: [facility] ORS;  Service: Ophthalmology;  Laterality: Right;  [place]:34.2 CDE 17.97 Fluid Pack lot # [id number]   CATARACT EXTRACTION W/PHACO Left 03/26/2019   Procedure: CATARACT EXTRACTION PHACO AND INTRAOCULAR LENS PLACEMENT ([employer] LEFT VISION BLUE;  Surgeon: HMarchia Meiers MD;  Location: [facility];  Service: Ophthalmology;  Laterality: Left;  [place]01:16.2 CDE 11.70 FLUID PACK LOT # [id number]   CHOLECYSTECTOMY  2008   COLONOSCOPY  04/2011   CYSTOCELE REPAIR N/A 12/31/2013   Procedure: ANTERIOR REPAIR (CYSTOCELE);  Surgeon: TDonnamae Jude MD;  [facility];  Service: Gynecology;  Laterality: N/A;   CYSTOSCOPY W/ URETERAL STENT PLACEMENT Left 01/16/2016   Procedure: CYSTOSCOPY WITH STENT REPLACEMENT;  Surgeon: AHollice Espy MD;   Location: [facility];  Service: Urology;  Laterality: Left;   CYSTOSCOPY WITH STENT PLACEMENT Left 12/23/2015   Procedure: CYSTOSCOPY WITH STENT PLACEMENT;  Surgeon: BArdis Hughs MD;  Location: [facility];  Service: Urology;  Laterality: Left;   DIAGNOSTIC LAPAROSCOPY     EYE SURGERY     INTRAMEDULLARY (IM) NAIL INTERTROCHANTERIC Right 01/25/2022   Procedure: INTRAMEDULLARY (IM) NAIL INTERTROCHANTERIC;  Surgeon: CRenee Harder MD;  Location: [facility];  Service: Orthopedics;  Laterality: Right;   kidney stone removed     MELANOMA EXCISION  2015   left posterior thigh   TONSILLECTOMY  1944   TUBAL LIGATION     UPPER GI ENDOSCOPY     URETEROSCOPY WITH HOLMIUM LASER LITHOTRIPSY Left 01/16/2016   Procedure: URETEROSCOPY WITH HOLMIUM LASER LITHOTRIPSY;  Surgeon: AHollice Espy MD;  Location: [facility];  Service: Urology;  Laterality: Left;   WISDOM TOOTH EXTRACTION     HPI: [facility]ana [facility] 82[facility] scans revealing of right intertrochanteric femur fracture. PMH: HTN, GAD, basal cell carcinoma, GERD, renal lithiasis.   Rapid response was called on 10/10, pt found to have acute/subacute infarcts.     Assessment / Plan / Recommendation  Clinical Impression  Pt presents with moderate dysphagia. Pt was tearful and anxious needed consoling throughout assessment. Pts speech was mostly unintelligible and Pt c/o mouth dryness. Once given ice chips, Pts speech became more intelligible but still difficulty to understand. Given trials  of thin liquids, Pt presented with weak cough. She tolerated several sips of nectar thick liquid without s/s of aspiration but did demonstrate coughing when following graham cracker with nectar thick liquids as well as coughing with mulitple sips of nectar thick liquids. Pt tolerated the applesauce well but did occasionally need cues to swallow. Given a bite of graham cracker Pt needed extended time to masticate and presented with oral  residue after the swallow. Rec trial diet of Dysphagia 1 with nectar thick liquids by spoon or very small sips. Meds crushed in applesauce. Stop feeding if any s/s of aspiration. May have ice chips after oral care. ST to follow and alter diet as needed. Prognosis fair. SLP Visit Diagnosis: Dysphagia, oropharyngeal phase (R13.12)    Aspiration Risk  Moderate aspiration risk    Diet Recommendation Dysphagia 1 (Puree);Nectar-thick liquid   Liquid Administration via: Spoon (nectar by spoon) Medication Administration: Crushed with puree Supervision: Full supervision/cueing for compensatory strategies Compensations: Small sips/bites;Minimize environmental distractions;Slow rate Postural Changes: Seated upright at 90 degrees;Remain upright for at least 30 minutes after po intake    Other  Recommendations Oral Care Recommendations: Oral care BID;Oral care prior to ice chip/H20    Recommendations for follow up therapy are one component of a multi-disciplinary discharge planning process, led by the attending physician.  Recommendations may be updated based on patient status, additional functional criteria and insurance authorization.  Follow up Recommendations  (ST at discharge)      Assistance Recommended at Discharge Frequent or constant Supervision/Assistance  Functional Status Assessment Patient has had a recent decline in their functional status and demonstrates the ability to make significant improvements in function in a reasonable and predictable amount of time.  Frequency and Duration min 2x/week  1 week       Prognosis Prognosis for Safe Diet Advancement: Fair Barriers to Reach Goals: Severity of deficits;Behavior      Swallow Study   General     Oral/Motor/Sensory Function Overall Oral Motor/Sensory Function: Generalized oral weakness Facial Symmetry: Within Functional Limits   Ice Chips Ice chips: Within functional limits   Thin Liquid Thin Liquid: Impaired Presentation:  Spoon;Cup Oral Phase Impairments: Reduced labial seal Oral Phase Functional Implications: Oral holding Pharyngeal  Phase Impairments: Cough - Immediate    Nectar Thick Nectar Thick Liquid: Impaired Presentation: Cup;Spoon;Self Fed Oral phase functional implications: Oral holding Pharyngeal Phase Impairments: Cough - Delayed (with large gulps)   Honey Thick Honey Thick Liquid: Not tested   Puree Puree: Impaired Presentation: Spoon Oral Phase Functional Implications: Prolonged oral transit   Solid     Solid: Impaired Oral Phase Impairments: Impaired mastication;Poor awareness of bolus;Reduced lingual movement/coordination Oral Phase Functional Implications: Oral residue;Oral holding      Lucila Maine 02/01/2022,12:40 PM

## 2022-02-02 DIAGNOSIS — S72001A Fracture of unspecified part of neck of right femur, initial encounter for closed fracture: Secondary | ICD-10-CM | POA: Diagnosis not present

## 2022-02-02 DIAGNOSIS — N179 Acute kidney failure, unspecified: Secondary | ICD-10-CM | POA: Diagnosis not present

## 2022-02-02 DIAGNOSIS — D72823 Leukemoid reaction: Secondary | ICD-10-CM | POA: Diagnosis not present

## 2022-02-02 DIAGNOSIS — S72141A Displaced intertrochanteric fracture of right femur, initial encounter for closed fracture: Secondary | ICD-10-CM | POA: Diagnosis not present

## 2022-02-02 DIAGNOSIS — I639 Cerebral infarction, unspecified: Secondary | ICD-10-CM | POA: Diagnosis not present

## 2022-02-02 DIAGNOSIS — R7881 Bacteremia: Secondary | ICD-10-CM | POA: Diagnosis not present

## 2022-02-02 DIAGNOSIS — E43 Unspecified severe protein-calorie malnutrition: Secondary | ICD-10-CM | POA: Insufficient documentation

## 2022-02-02 DIAGNOSIS — E876 Hypokalemia: Secondary | ICD-10-CM

## 2022-02-02 LAB — CBC WITH DIFFERENTIAL/PLATELET
Abs Immature Granulocytes: 0.55 10*3/uL — ABNORMAL HIGH (ref 0.00–0.07)
Basophils Absolute: 0.1 10*3/uL (ref 0.0–0.1)
Basophils Relative: 0 %
Eosinophils Absolute: 0.2 10*3/uL (ref 0.0–0.5)
Eosinophils Relative: 1 %
HCT: 27.9 % — ABNORMAL LOW (ref 36.0–46.0)
Hemoglobin: 9.5 g/dL — ABNORMAL LOW (ref 12.0–15.0)
Immature Granulocytes: 2 %
Lymphocytes Relative: 6 %
Lymphs Abs: 1.6 10*3/uL (ref 0.7–4.0)
MCH: 32.4 pg (ref 26.0–34.0)
MCHC: 34.1 g/dL (ref 30.0–36.0)
MCV: 95.2 fL (ref 80.0–100.0)
Monocytes Absolute: 0.7 10*3/uL (ref 0.1–1.0)
Monocytes Relative: 3 %
Neutro Abs: 22.2 10*3/uL — ABNORMAL HIGH (ref 1.7–7.7)
Neutrophils Relative %: 88 %
Platelets: 120 10*3/uL — ABNORMAL LOW (ref 150–400)
RBC: 2.93 MIL/uL — ABNORMAL LOW (ref 3.87–5.11)
RDW: 13 % (ref 11.5–15.5)
WBC: 25.3 10*3/uL — ABNORMAL HIGH (ref 4.0–10.5)
nRBC: 0 % (ref 0.0–0.2)

## 2022-02-02 LAB — BASIC METABOLIC PANEL
Anion gap: 8 (ref 5–15)
BUN: 25 mg/dL — ABNORMAL HIGH (ref 8–23)
CO2: 20 mmol/L — ABNORMAL LOW (ref 22–32)
Calcium: 8.4 mg/dL — ABNORMAL LOW (ref 8.9–10.3)
Chloride: 117 mmol/L — ABNORMAL HIGH (ref 98–111)
Creatinine, Ser: 0.86 mg/dL (ref 0.44–1.00)
GFR, Estimated: >60 mL/min (ref >60)
Glucose, Bld: 107 mg/dL — ABNORMAL HIGH (ref 70–99)
Potassium: 3.4 mmol/L — ABNORMAL LOW (ref 3.5–5.1)
Sodium: 145 mmol/L (ref 135–145)

## 2022-02-02 LAB — MAGNESIUM: Magnesium: 1.8 mg/dL (ref 1.7–2.4)

## 2022-02-02 MED ORDER — POTASSIUM CHLORIDE 20 MEQ PO PACK
40.0000 meq | PACK | Freq: Once | ORAL | Status: AC
  Administered 2022-02-02: 40 meq via ORAL
  Filled 2022-02-02: qty 2

## 2022-02-02 MED ORDER — NEPRO/CARBSTEADY PO LIQD
237.0000 mL | Freq: Two times a day (BID) | ORAL | Status: DC
  Administered 2022-02-02 – 2022-02-09 (×10): 237 mL via ORAL

## 2022-02-02 NOTE — Plan of Care (Signed)

## 2022-02-02 NOTE — Progress Notes (Signed)
S: Patient remains confused but is able to tell me her first name. 4/4 blood cultures (+) for Klebsiella, now on ceftriaxone. BLE US showed bilat DVT below the knee. Patient was placed on DAPT instead of heparin to reduce risk of hemorrhagic conversion of recent ischemic stroke.  O:  Vitals:   02/01/22 0854 02/01/22 2323  BP: 136/89 116/78  Pulse: 96 92  Resp: 16 18  Temp: 97.6 F (36.4 C) 98.1 F (36.7 C)  SpO2: 98% 99%   Physical Exam Gen: alert, oriented to self only Resp: CTAB, no w/r/r CV: RRR, no m/g/r; nml S1 and S2. 2+ symmetric peripheral pulses.   Neuro: *MS: alert, oriented to self only, follows simple commands *Speech: moderately dysarthric, unable to name or repeat *CN: PERRLA, blinks to threat bilat, tracks examiner, sensation intact, face symmetric at rest, hearing intact to voice *Motor & sensory: withdraws to noxious stimuli x4 extrem. Anti-gravity on command LUE>RUE. RUE exam limited 2/2 pain from IV site *Coordination:  UTA *Reflexes:  2+ and symmetric throughout without clonus; toes down-going bilat *Gait: deferred  MRI brain confirmed bilateral frontoparietal infarcts as well as one in the right temporal lobe, also personally reviewed. Infarcts appear embolic and acute/subacute  A/P: This is a 81 yo woman with hx melanoma 2016, HTN, depression admitted after mechanical fall causing a hip fracture now s/p operative repair on whom neurology is consulted for multifocal subacute embolic infarcts in the setting of sepsis 2/2 Klebsiella bacteremia.   - Goal normotension, strict avoidance of hypotension - Carotid US - MRA motion degraded - TEE r/o vegetations/endocarditis - Check A1c and LDL + add statin per guidelines - ASA '81mg'$  daily + plavix '75mg'$  daily x21 days f/b ASA '81mg'$  daily monotherapy after that. If she starts anticoagulation for DVT antiplatelets can be stopped - Consider transition to anticoagulation in 3 days after head CT r/o interval hemorrhagic  conversion - q4 hr neuro checks - STAT head CT for any change in neuro exam - Tele - PT/OT/SLP - Stroke education - Amb referral to neurology upon discharge  - Will continue to follow  Su Monks, MD [facility] [phone]  If 7pm- 7am, please page neurology on call as listed in [place].

## 2022-02-02 NOTE — Plan of Care (Signed)
  Problem: Clinical Measurements: Goal: Diagnostic test results will improve Outcome: Progressing Goal: Respiratory complications will improve Outcome: Progressing Goal: Cardiovascular complication will be avoided Outcome: Progressing   Problem: Nutrition: Goal: Adequate nutrition will be maintained Outcome: Progressing   Problem: Coping: Goal: Level of anxiety will decrease Outcome: Progressing   Problem: Pain Managment: Goal: General experience of comfort will improve Outcome: Progressing   Problem: Safety: Goal: Ability to remain free from injury will improve Outcome: Progressing

## 2022-02-02 NOTE — Progress Notes (Signed)
OT Cancellation Note  Patient Details Name: Katie Bell MRN: [id number] DOB: 09-30-1940   Cancelled Treatment:    Reason Eval/Treat Not Completed: Other (comment) (per discussion with therapy team, at this time, will hold therapy on this date anticoagulants have been started and clear department protocols, will continue to follow.)  Shanon Payor, OTD OTR/L  02/02/22, 4:09 PM

## 2022-02-02 NOTE — Progress Notes (Signed)
Progress Note    Katie Bell  NTI:[id number] DOB: 1940/05/29  DOA: 01/24/2022 PCP: Venia Carbon, MD      Brief Narrative:    Medical records reviewed and are as summarized below:  Katie Bell is a 81 y.o. female medical history significant for hypertension, anxiety, history of basal cell carcinoma, GERD, kidney stones, who presented to the hospital after sustaining a mechanical fall at home.  She was found to have right hip fracture.        Assessment/Plan:   Principal Problem:   Closed right femoral fracture (HCC) Active Problems:   Hypertension   Severe sepsis ([place])   Leucocytosis   Fall at home, initial encounter   Bacteremia due to Klebsiella pneumoniae   CVA (cerebral vascular accident) ([place])   AKI (acute kidney injury) ([place])   Hypokalemia   Closed fracture of right hip ([employer])   Protein-calorie malnutrition, severe   Nutrition Problem: Severe Malnutrition Etiology: social / environmental circumstances  Signs/Symptoms: severe fat depletion, severe muscle depletion   Body mass index is 21.48 kg/m.   Acute embolic stroke: Continue aspirin and Plavix.  2D echo showed EF estimated at 55 to 60%, indeterminate LV diastolic parameters, no evidence of vegetations.  TEE has been rescheduled for 02/05/2022.  Follow-up with neurologist and cardiologist.   Dysphagia: Speech therapist upgraded diet from dysphagia 1 diet to dysphagia 2 diet with nectar thick liquids.   Acute lower extremity (bilateral calf) DVT: 1 right peroneal vein and left posterior tibial vein DVT.  No full dose anticoagulation for now.  Plan to repeat CT head on 02/04/2022.  Full dose anticoagulation will be considered if there is no evidence of hemorrhagic conversion.  Continue prophylactic dose of Lovenox for now.   Severe sepsis secondary to Klebsiella pneumoniae bacteremia, severe leukocytosis, recent fever (Tmax 102.1 F) on 01/30/2022: WBC is down from 52.3 to 36 to 25.3.   She is still on IV ceftriaxone.  Follow-up with ID for further recommendations. Urine culture showed yeast..  Lactic acid down from 3.4-2.8.  Procalcitonin 5.36.   Right hip fracture: S/p right intramedullary nail intertrochanteric on 01/25/2022.  Outpatient follow-up with Dr. Sharlet Salina, orthopedic surgeon, for staple removal and x-rays.  Acute blood loss anemia: H&H is stable.  No indication for blood transfusion at this time.  AKI, acute hypoxic respiratory failure: Resolved:     Diet Order             DIET DYS 2 Room service appropriate? Yes with Assist; Fluid consistency: Nectar Thick  Diet effective now                            Consultants: Neurologist ID specialist Orthopedic surgeon Cardiologist  Procedures: Right intramedullary nail intertrochanteric for right hip fracture on 01/25/2022    Medications:    aspirin  81 mg Oral Daily   clopidogrel  75 mg Oral Daily   docusate sodium  100 mg Oral BID   enoxaparin (LOVENOX) injection  30 mg Subcutaneous Daily   feeding supplement (NEPRO CARB STEADY)  237 mL Oral BID BM   multivitamin with minerals  1 tablet Oral Daily   pantoprazole  40 mg Oral Daily   polyethylene glycol  17 g Oral Daily   Continuous Infusions:  sodium chloride 20 mL/hr at 02/02/22 0336   cefTRIAXone (ROCEPHIN)  IV Stopped (02/01/22 1610)     Anti-infectives (From admission, onward)  Start     Dose/Rate Route Frequency Ordered Stop   01/30/22 1800  vancomycin (VANCOCIN) IVPB 1000 mg/200 mL premix        1,000 mg 200 mL/hr over 60 Minutes Intravenous  Once 01/30/22 1713 01/30/22 1945   01/30/22 1713  vancomycin variable dose per unstable renal function (pharmacist dosing)  Status:  Discontinued         Does not apply See admin instructions 01/30/22 1713 01/31/22 1339   01/30/22 1600  cefTRIAXone (ROCEPHIN) 2 g in sodium chloride 0.9 % 100 mL IVPB        2 g 200 mL/hr over 30 Minutes Intravenous Every 24 hours 01/30/22 1501      01/25/22 2200  ceFAZolin (ANCEF) IVPB 2g/100 mL premix        2 g 200 mL/hr over 30 Minutes Intravenous Every 6 hours 01/25/22 2031 01/26/22 0510   01/25/22 1700  gentamicin 80 mg in 0.9% sodium chloride 250 mL irrigation  Status:  Discontinued          As needed 01/25/22 1700 01/25/22 1712   01/25/22 1350  ceFAZolin (ANCEF) 1-4 GM/50ML-% IVPB       Note to Pharmacy: Herby Abraham W: cabinet override      01/25/22 1350 01/25/22 1624   01/25/22 0800  ceFAZolin (ANCEF) IVPB 1 g/50 mL premix        1 g 100 mL/hr over 30 Minutes Intravenous To Surgery 01/24/22 2112 01/25/22 1645              Family Communication/Anticipated D/C date and plan/Code Status   DVT prophylaxis: enoxaparin (LOVENOX) injection 30 mg Start: 02/01/22 2200 SCDs Start: 01/25/22 1708     Code Status: Full Code  Family Communication: None Disposition Plan: Plan to discharge to SNF when medically stable   Status is: Inpatient Remains inpatient appropriate because: Acute stroke, Klebsiella bacteremia       Subjective:   Interval events noted.  She is confused unable to provide any history.  Juliann Pulse, speech therapist, was at the bedside.  Objective:    Vitals:   01/31/22 2302 02/01/22 0854 02/01/22 2323 02/02/22 0801  BP: 134/78 136/89 116/78 (!) 169/82  Pulse: 93 96 92 98  Resp: '16 16 18 18  '$ Temp: 98.8 F (37.1 C) 97.6 F (36.4 C) 98.1 F (36.7 C) (!) 97.5 F (36.4 C)  TempSrc:      SpO2: 97% 98% 99% 96%  Weight:      Height:       No data found.   Intake/Output Summary (Last 24 hours) at 02/02/2022 1545 Last data filed at 02/02/2022 0600 Gross per 24 hour  Intake 447.8 ml  Output 800 ml  Net -352.2 ml   Filed Weights   01/24/22 1614  Weight: 49.9 kg    Exam:  GEN: NAD SKIN: Warm and dry.  Ecchymoses on right hip. EYES: No pallor or icterus ENT: MMM CV: RRR PULM: CTA B ABD: soft, ND, NT, +BS CNS: Alert and oriented to person only.,  Confused, slurred speech.  No  focal deficits.  EXT: No edema or tenderness       Data Reviewed:   I have personally reviewed following labs and imaging studies:  Labs: Labs show the following:   Basic Metabolic Panel: Recent Labs  Lab 01/27/22 1034 01/28/22 0740 01/29/22 0904 01/30/22 0637 01/31/22 0541 02/01/22 0629 02/02/22 0449  NA 140 138 141 138 141 146* 145  K 3.0* 2.9* 3.6 3.5 3.9 4.0 3.4*  CL 109 108 113* 111 114* 119* 117*  CO2 23 20* 21* 18* 18* 17* 20*  GLUCOSE 146* 130* 168* 131* 145* 147* 107*  BUN 23 40* 45* 49* 54* 46* 25*  CREATININE 0.98 1.24* 1.60* 2.11* 2.14* 1.23* 0.86  CALCIUM 9.0 9.2 9.1 8.4* 8.0* 8.4* 8.4*  MG 2.3 2.5*  --   --   --  2.1 1.8  PHOS  --   --   --   --   --  2.7  --    GFR Estimated Creatinine Clearance: 36.2 mL/min (by C-G formula based on SCr of 0.86 mg/dL). Liver Function Tests: Recent Labs  Lab 02/01/22 0629  AST 25  ALT 12  ALKPHOS 84  BILITOT 1.2  PROT 6.0*  ALBUMIN 2.7*   No results for input(s): "LIPASE", "AMYLASE" in the last 168 hours. No results for input(s): "AMMONIA" in the last 168 hours. Coagulation profile No results for input(s): "INR", "PROTIME" in the last 168 hours.   CBC: Recent Labs  Lab 01/29/22 0936 01/30/22 0637 01/31/22 0541 01/31/22 1559 02/01/22 0629 02/02/22 0449  WBC 16.5* 12.4* 52.3* 46.8* 36.0* 25.3*  NEUTROABS 13.8*  --  47.4* 41.7* 32.4* 22.2*  HGB 9.9* 9.7* 8.7* 9.3* 10.8* 9.5*  HCT 28.8* 28.5* 26.0* 27.7* 32.1* 27.9*  MCV 92.0 94.7 94.2 96.2 95.8 95.2  PLT 145* 105* 96* 100* 114* 120*   Cardiac Enzymes: No results for input(s): "CKTOTAL", "CKMB", "CKMBINDEX", "TROPONINI" in the last 168 hours. BNP (last 3 results) No results for input(s): "PROBNP" in the last 8760 hours. CBG: Recent Labs  Lab 01/27/22 0957 01/30/22 0539  GLUCAP 145* 144*   D-Dimer: No results for input(s): "DDIMER" in the last 72 hours.  Hgb A1c: Recent Labs    01/31/22 0541  HGBA1C 5.1   Lipid Profile: Recent Labs     01/31/22 0541  CHOL 86  HDL 25*  LDLCALC 47  TRIG 70  CHOLHDL 3.4   Thyroid function studies: No results for input(s): "TSH", "T4TOTAL", "T3FREE", "THYROIDAB" in the last 72 hours.  Invalid input(s): "FREET3" Anemia work up: No results for input(s): "VITAMINB12", "FOLATE", "FERRITIN", "TIBC", "IRON", "RETICCTPCT" in the last 72 hours. Sepsis Labs: Recent Labs  Lab 01/30/22 2979 01/30/22 0754 01/31/22 0541 01/31/22 1559 02/01/22 0629 02/02/22 0449  PROCALCITON 5.36  --   --   --   --   --   WBC 12.4*  --  52.3* 46.8* 36.0* 25.3*  LATICACIDVEN 3.4* 2.8*  --   --   --   --     Microbiology Recent Results (from the past 240 hour(s))  Culture, blood (Routine X 2) w Reflex to ID Panel     Status: Abnormal (Preliminary result)   Collection Time: 01/30/22  3:04 PM   Specimen: BLOOD  Result Value Ref Range Status   Specimen Description   Final    BLOOD RIGHT ANTECUBITAL Performed at [facility], [address], [place], [place] [address]    Special Requests   Final    BOTTLES DRAWN AEROBIC AND ANAEROBIC Blood Culture adequate volume Performed at [facility], [address] [place], [place] [address]    Culture  Setup Time   Final    GRAM NEGATIVE RODS IN BOTH AEROBIC AND ANAEROBIC BOTTLES CRITICAL RESULT CALLED TO, READ BACK BY AND VERIFIED WITH: BRANDON BEERS @ 0814 01/31/22 LFD CRITICAL VALUE NOTED.  VALUE IS CONSISTENT WITH PREVIOUSLY REPORTED AND CALLED VALUE. Performed at [facility], [address] [place], [place] [address]    Culture KLEBSIELLA PNEUMONIAE (A)  Final   Report Status PENDING  Incomplete  Culture, blood (Routine X 2) w Reflex to ID Panel     Status: Abnormal (Preliminary result)   Collection Time: 01/30/22  3:04 PM   Specimen: BLOOD  Result Value Ref Range Status   Specimen Description   Final    BLOOD LEFT ANTECUBITAL Performed at [facility], [address], [place], [place] [address]    Special  Requests   Final    BOTTLES DRAWN AEROBIC AND ANAEROBIC Blood Culture adequate volume Performed at [facility], [address] [place], [place] [address]    Culture  Setup Time   Final    GRAM NEGATIVE RODS IN BOTH AEROBIC AND ANAEROBIC BOTTLES Organism ID to follow CRITICAL RESULT CALLED TO, READ BACK BY AND VERIFIED WITH: BRANDON BEERS @ 2683 01/31/22 LFD Performed at [facility], [address], [place], [place] [address]    Culture (A)  Final    KLEBSIELLA PNEUMONIAE SUSCEPTIBILITIES TO FOLLOW Performed at [facility], [place] [address], [place], [place] [address]    Report Status PENDING  Incomplete  Blood Culture ID Panel (Reflexed)     Status: Abnormal   Collection Time: 01/30/22  3:04 PM  Result Value Ref Range Status   Enterococcus faecalis NOT DETECTED NOT DETECTED Final   Enterococcus Faecium NOT DETECTED NOT DETECTED Final   Listeria monocytogenes NOT DETECTED NOT DETECTED Final   Staphylococcus species NOT DETECTED NOT DETECTED Final   Staphylococcus aureus (BCID) NOT DETECTED NOT DETECTED Final   Staphylococcus epidermidis NOT DETECTED NOT DETECTED Final   Staphylococcus lugdunensis NOT DETECTED NOT DETECTED Final   Streptococcus species NOT DETECTED NOT DETECTED Final   Streptococcus agalactiae NOT DETECTED NOT DETECTED Final   Streptococcus pneumoniae NOT DETECTED NOT DETECTED Final   Streptococcus pyogenes NOT DETECTED NOT DETECTED Final   A.calcoaceticus-baumannii NOT DETECTED NOT DETECTED Final   Bacteroides fragilis NOT DETECTED NOT DETECTED Final   Enterobacterales DETECTED (A) NOT DETECTED Final    Comment: Enterobacterales represent a large order of gram negative bacteria, not a single organism. CRITICAL RESULT CALLED TO, READ BACK BY AND VERIFIED WITH: BRANDON BEERS @ 8921 01/31/22 LFD    Enterobacter cloacae complex NOT DETECTED NOT DETECTED Final   Escherichia coli NOT DETECTED NOT DETECTED Final   Klebsiella aerogenes NOT  DETECTED NOT DETECTED Final   Klebsiella oxytoca NOT DETECTED NOT DETECTED Final   Klebsiella pneumoniae DETECTED (A) NOT DETECTED Final    Comment: CRITICAL RESULT CALLED TO, READ BACK BY AND VERIFIED WITH: BRANDON BEERS @ 1941 01/31/22 LFD    Proteus species NOT DETECTED NOT DETECTED Final   Salmonella species NOT DETECTED NOT DETECTED Final   Serratia marcescens NOT DETECTED NOT DETECTED Final   Haemophilus influenzae NOT DETECTED NOT DETECTED Final   Neisseria meningitidis NOT DETECTED NOT DETECTED Final   Pseudomonas aeruginosa NOT DETECTED NOT DETECTED Final   Stenotrophomonas maltophilia NOT DETECTED NOT DETECTED Final   Candida albicans NOT DETECTED NOT DETECTED Final   Candida auris NOT DETECTED NOT DETECTED Final   Candida glabrata NOT DETECTED NOT DETECTED Final   Candida krusei NOT DETECTED NOT DETECTED Final   Candida parapsilosis NOT DETECTED NOT DETECTED Final   Candida tropicalis NOT DETECTED NOT DETECTED Final   Cryptococcus neoformans/gattii NOT DETECTED NOT DETECTED Final   CTX-M ESBL NOT DETECTED NOT DETECTED Final   Carbapenem resistance IMP  NOT DETECTED NOT DETECTED Final   Carbapenem resistance KPC NOT DETECTED NOT DETECTED Final   Carbapenem resistance NDM NOT DETECTED NOT DETECTED Final   Carbapenem resist OXA 48 LIKE NOT DETECTED NOT DETECTED Final   Carbapenem resistance VIM NOT DETECTED NOT DETECTED Final    Comment: Performed at [facility], [address] [place], [place] [address]  MRSA Next Gen by PCR, Nasal     Status: None   Collection Time: 01/30/22  5:26 PM   Specimen: Nasal Mucosa; Nasal Swab  Result Value Ref Range Status   MRSA by PCR Next Gen NOT DETECTED NOT DETECTED Final    Comment: (NOTE) The GeneXpert MRSA Assay (FDA approved for NASAL specimens only), is one component of a comprehensive MRSA colonization surveillance program. It is not intended to diagnose MRSA infection nor to guide or monitor treatment for MRSA  infections. Test performance is not FDA approved in patients less than 40 years old. Performed at [facility], [address] [place], [place] [address]   Urine Culture     Status: Abnormal   Collection Time: 01/30/22  8:44 PM   Specimen: Urine, Clean Catch  Result Value Ref Range Status   Specimen Description   Final    URINE, CLEAN CATCH Performed at [facility], [address] [place], [place] [address]    Special Requests   Final    NONE Performed at [facility], [place], [place] [address]    Culture 50,000 COLONIES/mL YEAST (A)  Final   Report Status 02/01/2022 FINAL  Final  Culture, blood (Routine X 2) w Reflex to ID Panel     Status: None (Preliminary result)   Collection Time: 02/02/22  4:49 AM   Specimen: BLOOD  Result Value Ref Range Status   Specimen Description BLOOD LEFT WRIST  Final   Special Requests   Final    BOTTLES DRAWN AEROBIC AND ANAEROBIC Blood Culture adequate volume   Culture   Final    NO GROWTH <12 HOURS Performed at [facility], [address], [place], [place] [address]    Report Status PENDING  Incomplete    Procedures and diagnostic studies:  No results found.             LOS: 9 days   Marliyah Reid  [employer], advertising on [email or web address]. If 7PM-7AM, please contact night-coverage at [email or web address]     02/02/2022, 3:45 PM

## 2022-02-02 NOTE — Progress Notes (Signed)
Nutrition Follow-up  DOCUMENTATION CODES:   Severe malnutrition in context of social or environmental circumstances  INTERVENTION:   -D/c Ensure Enlive po BID, each supplement provides 350 kcal and 20 grams of protein.  -Continue MVI with minerals daily -Magic cup TID with meals, each supplement provides 290 kcal and 9 grams of protein  -Nepro Shake po TID, each supplement provides 425 kcal and 19 grams protein  -Feeding assistance with meals  NUTRITION DIAGNOSIS:   Severe Malnutrition related to social / environmental circumstances as evidenced by severe fat depletion, severe muscle depletion.  Ongoing  GOAL:   Patient will meet greater than or equal to 90% of their needs  Progressing  MONITOR:   PO intake, Supplement acceptance, Diet advancement  REASON FOR ASSESSMENT:   Consult Assessment of nutrition requirement/status  ASSESSMENT:   81 y.o. female presented to the ED after a fall at home. PMH includes malnutrition, GERD, and HTN. Pt admitted with R femoral fracture.  10/12- s/p BSE- advanced to dysphagia 1 diet with nectar thick liquids 10/13- s/p BSE- advanced to dysphagia 2 diet with nectar thick liquids  Reviewed I/O's: -232 ml x 24 hours and +287 ml since admission  UOP: 800 ml x 24 hours   Pt lying in bed at time of visit, unable to provide meaningful history. No family at bedside. Per discussion with nurse tech, SLP just evaluated and advanced her diet and did a little better with eating today compared to yesterday. Noted pt consumed about 1/4 of pureed waffle and a few bites of grits.   Per TOC notes, plan to d/c to SNF once medically stable.  Medications reviewed and include colace and miralax.   Labs reviewed: K: 3.4, CBGS: 144 (inpatient orders for glycemic control are none).    NUTRITION - FOCUSED PHYSICAL EXAM:  Flowsheet Row Most Recent Value  Orbital Region Severe depletion  Upper Arm Region Severe depletion  Thoracic and Lumbar Region  Severe depletion  Buccal Region Severe depletion  Temple Region Severe depletion  Clavicle Bone Region Severe depletion  Clavicle and Acromion Bone Region Severe depletion  Scapular Bone Region Severe depletion  Dorsal Hand Severe depletion  Patellar Region Moderate depletion  Anterior Thigh Region Moderate depletion  Posterior Calf Region Moderate depletion  Edema (RD Assessment) None  Hair Reviewed  Eyes Reviewed  Mouth Reviewed  Skin Reviewed  Nails Reviewed       Diet Order:   Diet Order             DIET DYS 2 Room service appropriate? Yes with Assist; Fluid consistency: Nectar Thick  Diet effective now                   EDUCATION NEEDS:   No education needs have been identified at this time  Skin:  Skin Assessment: Skin Integrity Issues: Skin Integrity Issues:: Incisions Incisions: closed rt hip, rt thigh  Last BM:  02/02/22  Height:   Ht Readings from Last 1 Encounters:  01/24/22 5' (1.524 m)    Weight:   Wt Readings from Last 1 Encounters:  01/24/22 49.9 kg    Ideal Body Weight:  45.5 kg  BMI:  Body mass index is 21.48 kg/m.  Estimated Nutritional Needs:   Kcal:  1550-1750  Protein:  85-100 grams  Fluid:  > 1.5 L    Loistine Chance, RD, LDN, [place] Registered Dietitian II Certified Diabetes Care and Education Specialist Please refer to [facility] for RD and/or RD on-call/weekend/after hours pager

## 2022-02-02 NOTE — Progress Notes (Signed)
   Date of Admission:  01/24/2022     ID: Katie Bell is a 81 y.o. female  Principal Problem:   Closed right femoral fracture (HCC) Active Problems:   Hypertension   Severe sepsis ([place])   Leucocytosis   Fall at home, initial encounter   Bacteremia due to Klebsiella pneumoniae   CVA (cerebral vascular accident) ([place])   AKI (acute kidney injury) ([place])   Hypokalemia   Closed fracture of right hip ([place])    Subjective: Pt is still confused Emotional tearful  Medications:   aspirin  81 mg Oral Daily   clopidogrel  75 mg Oral Daily   docusate sodium  100 mg Oral BID   enoxaparin (LOVENOX) injection  30 mg Subcutaneous Daily   feeding supplement (NEPRO CARB STEADY)  237 mL Oral BID BM   multivitamin with minerals  1 tablet Oral Daily   pantoprazole  40 mg Oral Daily   polyethylene glycol  17 g Oral Daily    Objective: Vital signs in last 24 hours: Patient Vitals for the past 24 hrs:  BP Temp Pulse Resp SpO2  02/02/22 0801 (!) 169/82 (!) 97.5 F (36.4 C) 98 18 96 %  02/01/22 2323 116/78 98.1 F (36.7 C) 92 18 99 %     PHYSICAL EXAM:  General: awake, confused, dysarthria, follows some commands Left facial palsy Lungs: b/l air entry Heart: s1s2 Abdomen: Soft, non-tender,not distended. Bowel sounds normal. No masses Neurologic: left sided weakness Rt thigh ecchymosis  Lab Results Recent Labs    02/01/22 0629 02/02/22 0449  WBC 36.0* 25.3*  HGB 10.8* 9.5*  HCT 32.1* 27.9*  NA 146* 145  K 4.0 3.4*  CL 119* 117*  CO2 17* 20*  BUN 46* 25*  CREATININE 1.23* 0.86   Liver Panel Recent Labs    02/01/22 0629  PROT 6.0*  ALBUMIN 2.7*  AST 25  ALT 12  ALKPHOS 19  BILITOT 1.2  Microbiology: BC- klebsiella  Studies/Results: No results found.   Assessment/Plan: ?Klebsiella bacteremia -likely Hospital acquired- no blood culture was done on admission Source- unclear- UTI ruled out - foley was there for a few hours for surgery and was removed- today it  was replaced due to urinary retention Surgical site to be observed closely Continue Ceftriaxone Repeat blood culture because of bvery high wbc   Fall- with fracture rt femur - s/p intertrochanteric nailing CT head on admission did not show any stroke     CVA-  She will need TEE to r/o embolic source from valve /endocarditis   Severe leucocytosis- leukemoid reaction, slowly improving Could it be from ecchymosis, b/l popliteal vein DVT or other cause   Anemia  Thrombocytopenia  Discussed the management with the care team

## 2022-02-02 NOTE — Progress Notes (Signed)
Speech Language Pathology Treatment: Dysphagia  Patient Details Name: Katie Bell MRN: [id number] DOB: 10-Sep-1940 Today's Date: 02/02/2022 Time: 1000-1050 SLP Time Calculation (min) (ACUTE ONLY): 50 min  Assessment / Plan / Recommendation Clinical Impression  Pt seen for f/u assessment of toleration of diet; trials to upgrade diet if possible. Upon entering room, noted pt's apparent Cognitive decline currently -- mumbled speech often; "drinking" behaviors from her Mitted hands at mouth(no cup in hand); leaning off of the bed upon entering room; min labile "I want to go home now". Pt was A/O only to self; NOT time nor place. Pt appears cachectic and weak.  On RA, afebrile.   Per MD, pt still has a procedure w/ Cardiology tentatively planned for tomorrow.   Pt appears to present w/ oropharyngeal phase dysphagia in setting of declined Cognitive status; unsure of pt's Baseline status. Also Acute for pt this admit is dx of acute/subacute R parietal infarct per MD/chart notes. ANY Cognitive decline can impact overall awareness/timing of swallow and safety during po tasks which increases risk for aspiration/aspiration pneumonia -- pt's risk is HIGH d/t more sedentary status s/p hip fx. Pt's risk for aspiration can be reduced when following general aspiration precautions, feeding support at meals d/t Confusion, and using a modified diet consistency of a dysphagia diet w/ thickened liquids.  Pt required Mod verbal/visual/tactile cues for follow through during po tasks and w/ feeding.        Pt consumed several trials of Single ice chips post oral care and prior to any other po's -- No immediate, overt s/s of aspiration noted. Adequate oral phase management and swallow appreciated.  W/ trials of thin liquids via cup/straw, OVERT COUGHING occurred x3/9 trials despite aspiration precautions and pinching straw to limit bolus amount. Pt exhibited decreased oral prep awareness and discoordination of bolus  management -- suspect consistency. W/ trials of Nectar liquids, purees, and minced solids, No overt clinical s/s of aspiration noted: no decline in vocal quality; no cough, and no decline in respiratory status during/post trials. Oral phase was adequate for bolus management and oral clearing was functional alternating foods/liquids to aid clearing -- must check. Mastication effort was adequate w/ the increased textured foods. Time and moistening the foods broken small was needed during all po trials.  Pt required feeding and cues for guidance d/t the Cognitive decline. Hand over hand guidance and visual cues were helpful; she often did "drinking" behaviors b/t trials bringing Mitts to mouth.   In setting of Acute illness/hospitalization, weakness and deconditioning, and Cognitive decline, risk for aspiration increased currently. Pt required full support w/ feeding also.  Recommend the dysphagia level 2(MINCED foods moistened for ease of oral phase/mastication w/ purees) w/ NECTAR liquids; general aspiration precautions; reduce Distractions during meals and engage pt during meals for self-feeding as able. Pills Crushed in Puree for safer swallowing as needed. Support w/ feeding at meals and ensure oral clearing. SINGLE ice chips BETWEEN meals w/ NSG supervision and post oral care; aspiration precautions. MD/NSG updated.   ST services recommends follow at next venue of care for ongoing assessment of Dysphagia and potential for diet upgrade in the future post Acuity of illness(including hip fx and surgery) and Cognitive decline. No further acute needs currently. Recommend f/u w/ Palliative Care for Katie Bell and support moving forward. Precautions posted in room.     HPI HPI: Pt is a 81 y.o. female with medical history significant of essential hypertension, anxiety disorder, previous history of basal cell carcinoma, Malnutrition,  Hiatal Hernia per chart, GERD and kidney stones who presents to the ER after  sustaining a mechanical fall at home.  Patient apparently slipped and fell down her stairs.  Sustained pain on the right hip with noticeable deformity.  Did not hit her head.  No evidence of other bodily injuries.  Patient brought to the ER where she was noted to have right intertrochanteric fracture.  Rapid response called 10/10, Pt found to have acute/subacute R parietal infarct per MRI; Head CT previously revealed Small vessel ischemic  changes.      SLP Plan  Goals updated (ST services to f/u at [facility])      Recommendations for follow up therapy are one component of a multi-disciplinary discharge planning process, led by the attending physician.  Recommendations may be updated based on patient status, additional functional criteria and insurance authorization.    Recommendations  Diet recommendations: Dysphagia 2 (fine chop);Nectar-thick liquid (added purees in diet) Liquids provided via: Cup;Straw (monitor) Medication Administration: Crushed with puree (for safer swallowing) Supervision: Staff to assist with self feeding;Full supervision/cueing for compensatory strategies Compensations: Minimize environmental distractions;Slow rate;Small sips/bites;Lingual sweep for clearance of pocketing;Multiple dry swallows after each bite/sip;Follow solids with liquid Postural Changes and/or Swallow Maneuvers: Out of bed for meals;Seated upright 90 degrees;Upright 30-60 min after meal (Reflux precautions)                General recommendations:  (Dietician f/u) Oral Care Recommendations: Oral care BID;Oral care before and after PO;Oral care prior to ice chip/H20;Staff/trained caregiver to provide oral care Follow Up Recommendations: Skilled nursing-short term rehab (<3 hours/day) Assistance recommended at discharge: Frequent or constant Supervision/Assistance SLP Visit Diagnosis: Dysphagia, oropharyngeal phase (R13.12);Cognitive communication deficit (R41.841) (decline) Plan: Goals updated (ST  services to f/u at SNF)             Orinda Kenner, [place], [place]; [place] [phone] (ascom) [name]  02/02/2022, 11:24 AM

## 2022-02-02 NOTE — Progress Notes (Signed)
PT Cancellation Note  Patient Details Name: Katie Bell MRN: [id number] DOB: 24-Oct-1940   Cancelled Treatment:     Per discussion with PT team, Will hold therapy until anticoagulants have been started and clear department protocols. We will however continue to follow and maintain on caseload.    Willette Pa 02/02/2022, 2:25 PM

## 2022-02-03 DIAGNOSIS — E876 Hypokalemia: Secondary | ICD-10-CM | POA: Diagnosis not present

## 2022-02-03 DIAGNOSIS — S72141A Displaced intertrochanteric fracture of right femur, initial encounter for closed fracture: Secondary | ICD-10-CM | POA: Diagnosis not present

## 2022-02-03 DIAGNOSIS — R7881 Bacteremia: Secondary | ICD-10-CM | POA: Diagnosis not present

## 2022-02-03 DIAGNOSIS — I639 Cerebral infarction, unspecified: Secondary | ICD-10-CM | POA: Diagnosis not present

## 2022-02-03 LAB — BASIC METABOLIC PANEL
Anion gap: 6 (ref 5–15)
BUN: 16 mg/dL (ref 8–23)
CO2: 23 mmol/L (ref 22–32)
Calcium: 7.9 mg/dL — ABNORMAL LOW (ref 8.9–10.3)
Chloride: 114 mmol/L — ABNORMAL HIGH (ref 98–111)
Creatinine, Ser: 0.89 mg/dL (ref 0.44–1.00)
GFR, Estimated: >60 mL/min (ref >60)
Glucose, Bld: 139 mg/dL — ABNORMAL HIGH (ref 70–99)
Potassium: 3 mmol/L — ABNORMAL LOW (ref 3.5–5.1)
Sodium: 143 mmol/L (ref 135–145)

## 2022-02-03 LAB — CBC WITH DIFFERENTIAL/PLATELET
Abs Immature Granulocytes: 0.16 10*3/uL — ABNORMAL HIGH (ref 0.00–0.07)
Basophils Absolute: 0.1 10*3/uL (ref 0.0–0.1)
Basophils Relative: 0 %
Eosinophils Absolute: 0.1 10*3/uL (ref 0.0–0.5)
Eosinophils Relative: 1 %
HCT: 26.5 % — ABNORMAL LOW (ref 36.0–46.0)
Hemoglobin: 9 g/dL — ABNORMAL LOW (ref 12.0–15.0)
Immature Granulocytes: 1 %
Lymphocytes Relative: 8 %
Lymphs Abs: 1.3 10*3/uL (ref 0.7–4.0)
MCH: 32.1 pg (ref 26.0–34.0)
MCHC: 34 g/dL (ref 30.0–36.0)
MCV: 94.6 fL (ref 80.0–100.0)
Monocytes Absolute: 0.8 10*3/uL (ref 0.1–1.0)
Monocytes Relative: 5 %
Neutro Abs: 13.9 10*3/uL — ABNORMAL HIGH (ref 1.7–7.7)
Neutrophils Relative %: 85 %
Platelets: 152 10*3/uL (ref 150–400)
RBC: 2.8 MIL/uL — ABNORMAL LOW (ref 3.87–5.11)
RDW: 12.8 % (ref 11.5–15.5)
WBC: 16.3 10*3/uL — ABNORMAL HIGH (ref 4.0–10.5)
nRBC: 0 % (ref 0.0–0.2)

## 2022-02-03 LAB — CULTURE, BLOOD (ROUTINE X 2)
Special Requests: ADEQUATE
Special Requests: ADEQUATE

## 2022-02-03 LAB — MAGNESIUM: Magnesium: 1.6 mg/dL — ABNORMAL LOW (ref 1.7–2.4)

## 2022-02-03 LAB — PHOSPHORUS: Phosphorus: 3.4 mg/dL (ref 2.5–4.6)

## 2022-02-03 MED ORDER — MAGNESIUM SULFATE 2 GM/50ML IV SOLN: 2.0000 g | Freq: Once | INTRAVENOUS | Status: DC

## 2022-02-03 MED ORDER — CHLORHEXIDINE GLUCONATE CLOTH 2 % EX PADS
6.0000 | MEDICATED_PAD | Freq: Every day | CUTANEOUS | Status: DC
  Administered 2022-02-03 – 2022-02-09 (×7): 6 via TOPICAL

## 2022-02-03 MED ORDER — POTASSIUM CHLORIDE 20 MEQ PO PACK
40.0000 meq | PACK | Freq: Two times a day (BID) | ORAL | Status: AC
  Administered 2022-02-03 (×2): 40 meq via ORAL
  Filled 2022-02-03 (×2): qty 2

## 2022-02-03 MED ORDER — MAGNESIUM SULFATE IN D5W 1-5 GM/100ML-% IV SOLN
1.0000 g | INTRAVENOUS | Status: AC
  Administered 2022-02-03 (×2): 1 g via INTRAVENOUS
  Filled 2022-02-03 (×2): qty 100

## 2022-02-03 NOTE — Progress Notes (Addendum)
Progress Note    Katie Bell  WUJ:[id number] DOB: 02/27/1941  DOA: 01/24/2022 PCP: Venia Carbon, MD      Brief Narrative:    Medical records reviewed and are as summarized below:  Katie Bell is a 81 y.o. female medical history significant for hypertension, anxiety, history of basal cell carcinoma, GERD, kidney stones, who presented to the hospital after sustaining a mechanical fall at home.  She was found to have right hip fracture.        Assessment/Plan:   Principal Problem:   Closed right femoral fracture (HCC) Active Problems:   Hypertension   Severe sepsis ([place])   Leucocytosis   Fall at home, initial encounter   Bacteremia due to Klebsiella pneumoniae   CVA (cerebral vascular accident) ([place])   AKI (acute kidney injury) ([place])   Hypokalemia   Closed fracture of right hip ([place])   Protein-calorie malnutrition, severe   Nutrition Problem: Severe Malnutrition Etiology: social / environmental circumstances  Signs/Symptoms: severe fat depletion, severe muscle depletion   Body mass index is 21.48 kg/m.   Acute embolic stroke: Continue aspirin and Plavix.  2D echo showed EF estimated at 55 to 60%, indeterminate LV diastolic parameters, no evidence of vegetations.  TEE was rescheduled for 11/05/2021.  I spoke to [place], son, and he wants to hold off on TEE for now.    Dysphagia: She is on dysphagia 2 diet with nectar thick liquids.   Acute lower extremity (bilateral calf) DVT: 1 right peroneal vein and left posterior tibial vein DVT.  No full dose anticoagulation for now.  Plan to repeat CT head tomorrow.  Full dose anticoagulation will be considered if there is no evidence of hemorrhagic conversion.  Continue prophylactic dose of Lovenox for now.   Severe sepsis secondary to Klebsiella pneumoniae bacteremia, severe leukocytosis, recent fever (Tmax 102.1 F) on 01/30/2022: Leukocytosis is improving.  Continue IV ceftriaxone.  Follow-up with ID for  further recommendations. Urine culture showed yeast..  Lactic acid down from 3.4-2.8.  Procalcitonin 5.36.   Hypokalemia and hypomagnesemia: Replete potassium with oral potassium chloride and replete magnesium with IV magnesium sulfate.  Monitor levels.  Check phosphorus level and replete as needed.  Right hip fracture: S/p right intramedullary nail intertrochanteric on 01/25/2022.  Outpatient follow-up with Dr. Sharlet Salina, orthopedic surgeon, for staple removal and x-rays.  Acute blood loss anemia: H&H is stable.  No indication for blood transfusion at this time.  AKI, acute hypoxic respiratory failure: Resolved:   Plan discussed with [name], son, over the phone.  He wants to hold off on TEE for now.  Diet Order             DIET DYS 2 Room service appropriate? Yes with Assist; Fluid consistency: Nectar Thick  Diet effective now                            Consultants: Neurologist ID specialist Orthopedic surgeon Cardiologist  Procedures: Right intramedullary nail intertrochanteric for right hip fracture on 01/25/2022    Medications:    aspirin  81 mg Oral Daily   Chlorhexidine Gluconate Cloth  6 each Topical Daily   clopidogrel  75 mg Oral Daily   docusate sodium  100 mg Oral BID   enoxaparin (LOVENOX) injection  30 mg Subcutaneous Daily   feeding supplement (NEPRO CARB STEADY)  237 mL Oral BID BM   multivitamin with minerals  1 tablet Oral Daily  pantoprazole  40 mg Oral Daily   polyethylene glycol  17 g Oral Daily   Continuous Infusions:  sodium chloride 20 mL/hr at 02/02/22 0336   cefTRIAXone (ROCEPHIN)  IV Stopped (02/02/22 1910)     Anti-infectives (From admission, onward)    Start     Dose/Rate Route Frequency Ordered Stop   01/30/22 1800  vancomycin (VANCOCIN) IVPB 1000 mg/200 mL premix        1,000 mg 200 mL/hr over 60 Minutes Intravenous  Once 01/30/22 1713 01/30/22 1945   01/30/22 1713  vancomycin variable dose per unstable renal function  (pharmacist dosing)  Status:  Discontinued         Does not apply See admin instructions 01/30/22 1713 01/31/22 1339   01/30/22 1600  cefTRIAXone (ROCEPHIN) 2 g in sodium chloride 0.9 % 100 mL IVPB        2 g 200 mL/hr over 30 Minutes Intravenous Every 24 hours 01/30/22 1501     01/25/22 2200  ceFAZolin (ANCEF) IVPB 2g/100 mL premix        2 g 200 mL/hr over 30 Minutes Intravenous Every 6 hours 01/25/22 2031 01/26/22 0510   01/25/22 1700  gentamicin 80 mg in 0.9% sodium chloride 250 mL irrigation  Status:  Discontinued          As needed 01/25/22 1700 01/25/22 1712   01/25/22 1350  ceFAZolin (ANCEF) 1-4 GM/50ML-% IVPB       Note to Pharmacy: Herby Abraham W: cabinet override      01/25/22 1350 01/25/22 1624   01/25/22 0800  ceFAZolin (ANCEF) IVPB 1 g/50 mL premix        1 g 100 mL/hr over 30 Minutes Intravenous To Surgery 01/24/22 2112 01/25/22 1645              Family Communication/Anticipated D/C date and plan/Code Status   DVT prophylaxis: enoxaparin (LOVENOX) injection 30 mg Start: 02/01/22 2200 SCDs Start: 01/25/22 1708     Code Status: Full Code  Family Communication: Plan discussed with mother, son over the phone Disposition Plan: Plan to discharge to SNF when medically stable   Status is: Inpatient Remains inpatient appropriate because: Acute stroke, Klebsiella bacteremia       Subjective:   She is unable to provide any history because of confusion.  Interval events noted.  Objective:    Vitals:   02/02/22 1646 02/02/22 2238 02/03/22 0533 02/03/22 0700  BP: (!) 159/79 (!) 159/75 (!) 149/72 (!) 141/65  Pulse: 94 95 98 83  Resp: '16 20 18 18  '$ Temp: 98.7 F (37.1 C) 98.6 F (37 C)  98.1 F (36.7 C)  TempSrc:  Axillary  Oral  SpO2: 100% 97% 99% 99%  Weight:      Height:       No data found.   Intake/Output Summary (Last 24 hours) at 02/03/2022 1144 Last data filed at 02/03/2022 0600 Gross per 24 hour  Intake 150 ml  Output 1751 ml  Net  -1601 ml   Filed Weights   01/24/22 1614  Weight: 49.9 kg    Exam:  GEN: NAD SKIN: Ecchymosis on right hip EYES: EOMI ENT: MMM CV: RRR PULM: CTA B ABD: soft, ND, NT, +BS CNS: Alert but confused.  She is oriented to person.  Speech is slurred.  No focal deficits EXT: No edema or tenderness     Data Reviewed:   I have personally reviewed following labs and imaging studies:  Labs: Labs show the following:  Basic Metabolic Panel: Recent Labs  Lab 01/28/22 0740 01/29/22 0904 01/30/22 3825 01/31/22 0541 02/01/22 0629 02/02/22 0449 02/03/22 0701  NA 138   < > 138 141 146* 145 143  K 2.9*   < > 3.5 3.9 4.0 3.4* 3.0*  CL 108   < > 111 114* 119* 117* 114*  CO2 20*   < > 18* 18* 17* 20* 23  GLUCOSE 130*   < > 131* 145* 147* 107* 139*  BUN 40*   < > 49* 54* 46* 25* 16  CREATININE 1.24*   < > 2.11* 2.14* 1.23* 0.86 0.89  CALCIUM 9.2   < > 8.4* 8.0* 8.4* 8.4* 7.9*  MG 2.5*  --   --   --  2.1 1.8 1.6*  PHOS  --   --   --   --  2.7  --   --    < > = values in this interval not displayed.   GFR Estimated Creatinine Clearance: 35 mL/min (by C-G formula based on SCr of 0.89 mg/dL). Liver Function Tests: Recent Labs  Lab 02/01/22 0629  AST 25  ALT 12  ALKPHOS 84  BILITOT 1.2  PROT 6.0*  ALBUMIN 2.7*   No results for input(s): "LIPASE", "AMYLASE" in the last 168 hours. No results for input(s): "AMMONIA" in the last 168 hours. Coagulation profile No results for input(s): "INR", "PROTIME" in the last 168 hours.   CBC: Recent Labs  Lab 01/31/22 0541 01/31/22 1559 02/01/22 0629 02/02/22 0449 02/03/22 0701  WBC 52.3* 46.8* 36.0* 25.3* 16.3*  NEUTROABS 47.4* 41.7* 32.4* 22.2* 13.9*  HGB 8.7* 9.3* 10.8* 9.5* 9.0*  HCT 26.0* 27.7* 32.1* 27.9* 26.5*  MCV 94.2 96.2 95.8 95.2 94.6  PLT 96* 100* 114* 120* 152   Cardiac Enzymes: No results for input(s): "CKTOTAL", "CKMB", "CKMBINDEX", "TROPONINI" in the last 168 hours. BNP (last 3 results) No results for  input(s): "PROBNP" in the last 8760 hours. CBG: Recent Labs  Lab 01/30/22 0539  GLUCAP 144*   D-Dimer: No results for input(s): "DDIMER" in the last 72 hours.  Hgb A1c: No results for input(s): "HGBA1C" in the last 72 hours.  Lipid Profile: No results for input(s): "CHOL", "HDL", "LDLCALC", "TRIG", "CHOLHDL", "LDLDIRECT" in the last 72 hours.  Thyroid function studies: No results for input(s): "TSH", "T4TOTAL", "T3FREE", "THYROIDAB" in the last 72 hours.  Invalid input(s): "FREET3" Anemia work up: No results for input(s): "VITAMINB12", "FOLATE", "FERRITIN", "TIBC", "IRON", "RETICCTPCT" in the last 72 hours. Sepsis Labs: Recent Labs  Lab 01/30/22 0539 01/30/22 0754 01/31/22 0541 01/31/22 1559 02/01/22 0629 02/02/22 0449 02/03/22 0701  PROCALCITON 5.36  --   --   --   --   --   --   WBC 12.4*  --    < > 46.8* 36.0* 25.3* 16.3*  LATICACIDVEN 3.4* 2.8*  --   --   --   --   --    < > = values in this interval not displayed.    Microbiology Recent Results (from the past 240 hour(s))  Culture, blood (Routine X 2) w Reflex to ID Panel     Status: Abnormal   Collection Time: 01/30/22  3:04 PM   Specimen: BLOOD  Result Value Ref Range Status   Specimen Description   Final    BLOOD RIGHT ANTECUBITAL Performed at [facility], [address], [place], [place] [address]    Special Requests   Final    BOTTLES DRAWN AEROBIC AND ANAEROBIC  Blood Culture adequate volume Performed at [facility], [address] [place], [place] [address]    Culture  Setup Time   Final    GRAM NEGATIVE RODS IN BOTH AEROBIC AND ANAEROBIC BOTTLES CRITICAL RESULT CALLED TO, READ BACK BY AND VERIFIED WITH: BRANDON BEERS @ 9323 01/31/22 LFD CRITICAL VALUE NOTED.  VALUE IS CONSISTENT WITH PREVIOUSLY REPORTED AND CALLED VALUE.    Culture (A)  Final    KLEBSIELLA PNEUMONIAE SUSCEPTIBILITIES PERFORMED ON PREVIOUS CULTURE WITHIN THE LAST 5 DAYS. Performed at [facility], [place] [address], [place], [place] [address]    Report Status 02/03/2022 FINAL  Final  Culture, blood (Routine X 2) w Reflex to ID Panel     Status: Abnormal   Collection Time: 01/30/22  3:04 PM   Specimen: BLOOD  Result Value Ref Range Status   Specimen Description   Final    BLOOD LEFT ANTECUBITAL Performed at [facility], [address] [place], [place] [address]    Special Requests   Final    BOTTLES DRAWN AEROBIC AND ANAEROBIC Blood Culture adequate volume Performed at [facility], [address] [place], [place] [address]    Culture  Setup Time   Final    GRAM NEGATIVE RODS IN BOTH AEROBIC AND ANAEROBIC BOTTLES Organism ID to follow CRITICAL RESULT CALLED TO, READ BACK BY AND VERIFIED WITH: BRANDON BEERS @ 3762 01/31/22 LFD Performed at [facility], [address] [place], [place] [address]    Culture KLEBSIELLA PNEUMONIAE (A)  Final   Report Status 02/03/2022 FINAL  Final   Organism ID, Bacteria KLEBSIELLA PNEUMONIAE  Final      Susceptibility   Klebsiella pneumoniae - MIC*    AMPICILLIN >=32 RESISTANT Resistant     CEFAZOLIN <=4 SENSITIVE Sensitive     CEFEPIME <=0.12 SENSITIVE Sensitive     CEFTAZIDIME <=1 SENSITIVE Sensitive     CEFTRIAXONE <=0.25 SENSITIVE Sensitive     CIPROFLOXACIN <=0.25 SENSITIVE Sensitive     GENTAMICIN <=1 SENSITIVE Sensitive     IMIPENEM <=0.25 SENSITIVE Sensitive     TRIMETH/SULFA <=20 SENSITIVE Sensitive     AMPICILLIN/SULBACTAM 8 SENSITIVE Sensitive     PIP/TAZO <=4 SENSITIVE Sensitive     * KLEBSIELLA PNEUMONIAE  Blood Culture ID Panel (Reflexed)     Status: Abnormal   Collection Time: 01/30/22  3:04 PM  Result Value Ref Range Status   Enterococcus faecalis NOT DETECTED NOT DETECTED Final   Enterococcus Faecium NOT DETECTED NOT DETECTED Final   Listeria monocytogenes NOT DETECTED NOT DETECTED Final   Staphylococcus species NOT DETECTED NOT DETECTED Final   Staphylococcus aureus (BCID) NOT DETECTED  NOT DETECTED Final   Staphylococcus epidermidis NOT DETECTED NOT DETECTED Final   Staphylococcus lugdunensis NOT DETECTED NOT DETECTED Final   Streptococcus species NOT DETECTED NOT DETECTED Final   Streptococcus agalactiae NOT DETECTED NOT DETECTED Final   Streptococcus pneumoniae NOT DETECTED NOT DETECTED Final   Streptococcus pyogenes NOT DETECTED NOT DETECTED Final   A.calcoaceticus-baumannii NOT DETECTED NOT DETECTED Final   Bacteroides fragilis NOT DETECTED NOT DETECTED Final   Enterobacterales DETECTED (A) NOT DETECTED Final    Comment: Enterobacterales represent a large order of gram negative bacteria, not a single organism. CRITICAL RESULT CALLED TO, READ BACK BY AND VERIFIED WITH: BRANDON BEERS @ 7616 01/31/22 LFD    Enterobacter cloacae complex NOT DETECTED NOT DETECTED Final   Escherichia coli NOT DETECTED NOT DETECTED Final   Klebsiella aerogenes NOT  DETECTED NOT DETECTED Final   Klebsiella oxytoca NOT DETECTED NOT DETECTED Final   Klebsiella pneumoniae DETECTED (A) NOT DETECTED Final    Comment: CRITICAL RESULT CALLED TO, READ BACK BY AND VERIFIED WITH: BRANDON BEERS @ 1610 01/31/22 LFD    Proteus species NOT DETECTED NOT DETECTED Final   Salmonella species NOT DETECTED NOT DETECTED Final   Serratia marcescens NOT DETECTED NOT DETECTED Final   Haemophilus influenzae NOT DETECTED NOT DETECTED Final   Neisseria meningitidis NOT DETECTED NOT DETECTED Final   Pseudomonas aeruginosa NOT DETECTED NOT DETECTED Final   Stenotrophomonas maltophilia NOT DETECTED NOT DETECTED Final   Candida albicans NOT DETECTED NOT DETECTED Final   Candida auris NOT DETECTED NOT DETECTED Final   Candida glabrata NOT DETECTED NOT DETECTED Final   Candida krusei NOT DETECTED NOT DETECTED Final   Candida parapsilosis NOT DETECTED NOT DETECTED Final   Candida tropicalis NOT DETECTED NOT DETECTED Final   Cryptococcus neoformans/gattii NOT DETECTED NOT DETECTED Final   CTX-M ESBL NOT DETECTED NOT  DETECTED Final   Carbapenem resistance IMP NOT DETECTED NOT DETECTED Final   Carbapenem resistance KPC NOT DETECTED NOT DETECTED Final   Carbapenem resistance NDM NOT DETECTED NOT DETECTED Final   Carbapenem resist OXA 48 LIKE NOT DETECTED NOT DETECTED Final   Carbapenem resistance VIM NOT DETECTED NOT DETECTED Final    Comment: Performed at [facility], [address] [place], [place] [address]  MRSA Next Gen by PCR, Nasal     Status: None   Collection Time: 01/30/22  5:26 PM   Specimen: Nasal Mucosa; Nasal Swab  Result Value Ref Range Status   MRSA by PCR Next Gen NOT DETECTED NOT DETECTED Final    Comment: (NOTE) The GeneXpert MRSA Assay (FDA approved for NASAL specimens only), is one component of a comprehensive MRSA colonization surveillance program. It is not intended to diagnose MRSA infection nor to guide or monitor treatment for MRSA infections. Test performance is not FDA approved in patients less than 78 years old. Performed at [facility], [address], [place], [place] [address]   Urine Culture     Status: Abnormal   Collection Time: 01/30/22  8:44 PM   Specimen: Urine, Clean Catch  Result Value Ref Range Status   Specimen Description   Final    URINE, CLEAN CATCH Performed at [facility], [address] [place], [place] [address]    Special Requests   Final    NONE Performed at [facility], [place], [place] [address]    Culture 50,000 COLONIES/mL YEAST (A)  Final   Report Status 02/01/2022 FINAL  Final  Culture, blood (Routine X 2) w Reflex to ID Panel     Status: None (Preliminary result)   Collection Time: 02/02/22  4:49 AM   Specimen: BLOOD  Result Value Ref Range Status   Specimen Description BLOOD LEFT WRIST  Final   Special Requests   Final    BOTTLES DRAWN AEROBIC AND ANAEROBIC Blood Culture adequate volume   Culture   Final    NO GROWTH 1 DAY Performed at [facility], [address], [place],  [address]    Report Status PENDING  Incomplete  Culture, blood (Routine X 2) w Reflex to ID Panel     Status: None (Preliminary result)   Collection Time: 02/02/22  6:23 AM   Specimen: Left Antecubital; Blood  Result Value Ref Range Status   Specimen Description LEFT  ANTECUBITAL  Final   Special Requests   Final    BOTTLES DRAWN AEROBIC AND ANAEROBIC BACTEROIDES CACCAE   Culture   Final    NO GROWTH < 24 HOURS Performed at [facility], [address] [place], [place] [address]    Report Status PENDING  Incomplete    Procedures and diagnostic studies:  No results found.             LOS: 10 days   Beatryce Colombo  [facility]   Pager on [email or web address]. If 7PM-7AM, please contact night-coverage at [email or web address]     02/03/2022, 11:44 AM

## 2022-02-03 NOTE — Progress Notes (Signed)
Physical Therapy Treatment Patient Details Name: Katie Bell MRN: [id number] DOB: 1941/01/28 Today's Date: 02/03/2022   History of Present Illness Katie Bell is an 68yoF who comes to [facility] on 10/4 after a fall, scans revealing of right intertrochanteric femur fracture. PMH: HTN, GAD, basal cell carcinoma, GERD, renal lithiasis. Pt taken to theatre under direction of Renee Harder for IM nail fixation, then made WBAT. At baseline pt lives with son, most household AMB without device, still drives occasionally, no recent balance or falls issues. Rapid response was called on 10/10, pt found to have acute/subacute infarcts. Additional workup revealing for urinary retention and bacteremia.    PT Comments    Pt has been cleared for bed level activity by MD until full anticoagulants are able to be administered. Pt was long sitting in bed, alert but only oriented to self. Inconsistent cognition throughout however anxious with all mobility. Pt had BUE mitts donned which were removed throughout session. Untouched breakfast tray at bedside. Lunch tray arrived during session. Pt had  incontinent of stool and was unaware. Author assisted RN tech with hygiene care but pt required extensive assistance to roll in both directions with more assistance required rolling R. Pt continues to present with pain and anxiety. Max assist to progress to/from EOB short sit. Pt presents with intense pain and anxiety and was quickly returned to supine. Repositioned to [facility]. Assisted with a few bites of lunch prior to RN staff taking over. Highly recommend DC to SNF. If pt continues to struggle with progress during therapy, possible down grade in frequency.    Recommendations for follow up therapy are one component of a multi-disciplinary discharge planning process, led by the attending physician.  Recommendations may be updated based on patient status, additional functional criteria and insurance authorization.  Follow Up  Recommendations  Skilled nursing-short term rehab (<3 hours/day)     Assistance Recommended at Discharge Frequent or constant Supervision/Assistance  Patient can return home with the following Two people to help with walking and/or transfers;Two people to help with bathing/dressing/bathroom;Direct supervision/assist for medications management;Direct supervision/assist for financial management;Assist for transportation;Assistance with cooking/housework;Assistance with feeding;Help with stairs or ramp for entrance   Equipment Recommendations   (defer to next level of care)       Precautions / Restrictions Precautions Precautions: Fall Restrictions Weight Bearing Restrictions: Yes RLE Weight Bearing: Weight bearing as tolerated     Mobility  Bed Mobility Overal bed mobility: Needs Assistance Bed Mobility: Rolling, Supine to Sit, Sit to Supine Rolling: Mod assist, Max assist   Supine to sit: Max assist Sit to supine: Max assist   General bed mobility comments: Pt's anxiety greatly limits session. She has incontinence of stool upon arrivng. Assited RN tech with rolling for hygiene care. Session progress to sitting up EOB with max assist + max vcs for relaxation. PT yell out with any movement. Returned to supine with HOB elevated and lunh tray placed in front of her. RN staff contacted that pt will require assistance with eating.    Transfers    General transfer comment: unable to safely advance.    Ambulation/Gait    General Gait Details: currently unable   Balance Overall balance assessment: Needs assistance Sitting-balance support: Bilateral upper extremity supported, Feet unsupported Sitting balance-Leahy Scale: Fair Sitting balance - Comments: Poor mostly due to anxiety and pain       Cognition Arousal/Alertness: Awake/alert Behavior During Therapy: Restless, Anxious Overall Cognitive Status: Impaired/Different from baseline Area of Impairment: Awareness, Memory,  Orientation, Attention, Following commands, Safety/judgement    Orientation Level: Disoriented to, Place, Time, Situation Current Attention Level: Alternating   Following Commands: Follows one step commands inconsistently Safety/Judgement: Decreased awareness of safety, Decreased awareness of deficits     General Comments: Pt is lert however only oriented to self. Hd BUE mitts donned upon arriving. Removed for session. Pt is extremely anxious with any/all movements           General Comments General comments (skin integrity, edema, etc.): Assisted pt with eating a few bites until RN staff arrived. Swollowing precautions were adhered to      Pertinent Vitals/Pain Pain Assessment Pain Assessment: PAINAD Breathing: occasional labored breathing, short period of hyperventilation Negative Vocalization: occasional moan/groan, low speech, negative/disapproving quality Facial Expression: sad, frightened, frown Body Language: tense, distressed pacing, fidgeting Consolability: distracted or reassured by voice/touch PAINAD Score: 5 Facial Expression: Grimacing Muscle Tension: Tense, rigid Pain Location: R hip Pain Descriptors / Indicators: Aching, Grimacing Pain Intervention(s): Limited activity within patient's tolerance, Monitored during session, Premedicated before session, Repositioned     PT Goals (current goals can now be found in the care plan section) Acute Rehab PT Goals Patient Stated Goal: none stated Progress towards PT goals: Not progressing toward goals - comment (limited by pain/cognition/medical complications throughout admission)    Frequency    7X/week      PT Plan Current plan remains appropriate       AM-PAC PT "6 Clicks" Mobility   Outcome Measure  Help needed turning from your back to your side while in a flat bed without using bedrails?: A Lot Help needed moving from lying on your back to sitting on the side of a flat bed without using bedrails?: A  Lot Help needed moving to and from a bed to a chair (including a wheelchair)?: A Lot Help needed standing up from a chair using your arms (e.g., wheelchair or bedside chair)?: A Lot Help needed to walk in hospital room?: Total Help needed climbing 3-5 steps with a railing? : Total 6 Click Score: 10    End of Session   Activity Tolerance: Patient limited by pain (Limited by pain and anxiety) Patient left: in bed;with bed alarm set;with call bell/phone within reach Nurse Communication: Mobility status PT Visit Diagnosis: Muscle weakness (generalized) (M62.81);History of falling (Z91.81)     Time: 4174-0814 PT Time Calculation (min) (ACUTE ONLY): 18 min  Charges:  $Therapeutic Activity: 8-22 mins                     Julaine Fusi PTA 02/03/22, 1:37 PM

## 2022-02-03 NOTE — Plan of Care (Signed)

## 2022-02-04 ENCOUNTER — Inpatient Hospital Stay: Payer: Medicare HMO

## 2022-02-04 DIAGNOSIS — S72141A Displaced intertrochanteric fracture of right femur, initial encounter for closed fracture: Secondary | ICD-10-CM | POA: Diagnosis not present

## 2022-02-04 DIAGNOSIS — I639 Cerebral infarction, unspecified: Secondary | ICD-10-CM | POA: Diagnosis not present

## 2022-02-04 DIAGNOSIS — S72001A Fracture of unspecified part of neck of right femur, initial encounter for closed fracture: Secondary | ICD-10-CM | POA: Diagnosis not present

## 2022-02-04 DIAGNOSIS — D72825 Bandemia: Secondary | ICD-10-CM | POA: Diagnosis not present

## 2022-02-04 DIAGNOSIS — R7881 Bacteremia: Secondary | ICD-10-CM | POA: Diagnosis not present

## 2022-02-04 DIAGNOSIS — E876 Hypokalemia: Secondary | ICD-10-CM | POA: Diagnosis not present

## 2022-02-04 DIAGNOSIS — N179 Acute kidney failure, unspecified: Secondary | ICD-10-CM | POA: Diagnosis not present

## 2022-02-04 LAB — CBC WITH DIFFERENTIAL/PLATELET
Abs Immature Granulocytes: 0.23 10*3/uL — ABNORMAL HIGH (ref 0.00–0.07)
Basophils Absolute: 0 10*3/uL (ref 0.0–0.1)
Basophils Relative: 0 %
Eosinophils Absolute: 0.3 10*3/uL (ref 0.0–0.5)
Eosinophils Relative: 2 %
HCT: 25.7 % — ABNORMAL LOW (ref 36.0–46.0)
Hemoglobin: 8.6 g/dL — ABNORMAL LOW (ref 12.0–15.0)
Immature Granulocytes: 1 %
Lymphocytes Relative: 7 %
Lymphs Abs: 1.5 10*3/uL (ref 0.7–4.0)
MCH: 31.6 pg (ref 26.0–34.0)
MCHC: 33.5 g/dL (ref 30.0–36.0)
MCV: 94.5 fL (ref 80.0–100.0)
Monocytes Absolute: 1 10*3/uL (ref 0.1–1.0)
Monocytes Relative: 5 %
Neutro Abs: 16.6 10*3/uL — ABNORMAL HIGH (ref 1.7–7.7)
Neutrophils Relative %: 85 %
Platelets: 203 10*3/uL (ref 150–400)
RBC: 2.72 MIL/uL — ABNORMAL LOW (ref 3.87–5.11)
RDW: 12.9 % (ref 11.5–15.5)
WBC: 19.5 10*3/uL — ABNORMAL HIGH (ref 4.0–10.5)
nRBC: 0 % (ref 0.0–0.2)

## 2022-02-04 LAB — RENAL FUNCTION PANEL
Albumin: 2.3 g/dL — ABNORMAL LOW (ref 3.5–5.0)
Anion gap: 3 — ABNORMAL LOW (ref 5–15)
BUN: 12 mg/dL (ref 8–23)
CO2: 23 mmol/L (ref 22–32)
Calcium: 7.7 mg/dL — ABNORMAL LOW (ref 8.9–10.3)
Chloride: 115 mmol/L — ABNORMAL HIGH (ref 98–111)
Creatinine, Ser: 0.74 mg/dL (ref 0.44–1.00)
GFR, Estimated: >60 mL/min (ref >60)
Glucose, Bld: 101 mg/dL — ABNORMAL HIGH (ref 70–99)
Phosphorus: 2.9 mg/dL (ref 2.5–4.6)
Potassium: 3.3 mmol/L — ABNORMAL LOW (ref 3.5–5.1)
Sodium: 141 mmol/L (ref 135–145)

## 2022-02-04 LAB — MAGNESIUM: Magnesium: 2.2 mg/dL (ref 1.7–2.4)

## 2022-02-04 MED ORDER — ENOXAPARIN SODIUM 30 MG/0.3ML IJ SOSY
30.0000 mg | PREFILLED_SYRINGE | Freq: Every day | INTRAMUSCULAR | Status: DC
  Administered 2022-02-04 – 2022-02-05 (×2): 30 mg via SUBCUTANEOUS
  Filled 2022-02-04 (×2): qty 0.3

## 2022-02-04 MED ORDER — ENOXAPARIN SODIUM 40 MG/0.4ML IJ SOSY: 40.0000 mg | PREFILLED_SYRINGE | Freq: Every day | INTRAMUSCULAR | Status: DC

## 2022-02-04 MED ORDER — POTASSIUM CHLORIDE 20 MEQ PO PACK
40.0000 meq | PACK | Freq: Two times a day (BID) | ORAL | Status: AC
  Administered 2022-02-04 (×2): 40 meq via ORAL
  Filled 2022-02-04 (×2): qty 2

## 2022-02-04 NOTE — Progress Notes (Signed)
S: Patient remains pleasantly confused, oriented to self only.  Repeat head CT today (day 5 from stroke) shows petechial hemorrhage at side of R parietal infarct w/o frank hematoma  O:  Vitals:   02/04/22 0720 02/04/22 1709  BP: (!) 147/82 (!) 155/77  Pulse: 90 98  Resp:    Temp: 98.7 F (37.1 C) 98 F (36.7 C)  SpO2: 95% 96%   Physical Exam Gen: alert, oriented to self only Resp: CTAB, no w/r/r CV: RRR, no m/g/r; nml S1 and S2. 2+ symmetric peripheral pulses.   Neuro: *MS: alert, oriented to self only, follows simple commands *Speech: moderately dysarthric, unable to name or repeat *CN: PERRLA, blinks to threat bilat, tracks examiner, sensation intact, face symmetric at rest, hearing intact to voice *Motor & sensory: withdraws to noxious stimuli x4 extrem. Anti-gravity on command LUE>RUE. RUE exam limited 2/2 pain from IV site *Coordination:  UTA *Reflexes:  2+ and symmetric throughout without clonus; toes down-going bilat *Gait: deferred  [place] 10/15: small amt petechial hemorrhage into R parietal infarct new from CT 10/10  MRI brain 10/10: bilateral frontoparietal infarcts as well as one in the right temporal lobe, also personally reviewed. Infarcts appear embolic and acute/subacute  CTH 10/10: new R parietal hypodensity not seen on admission head CT  All CNS imaging personally reviewed  A/P: This is a 81 yo woman with hx melanoma 2016, HTN, depression admitted after mechanical fall causing a hip fracture now s/p operative repair on whom neurology is consulted for multifocal subacute embolic infarcts in the setting of sepsis 2/2 Klebsiella bacteremia. She has bilat DVTs below the knee but was not started on heparin gtt immediately 2/2 acute ischemic stroke. The plan was to consider starting anticoagulation today after repeat head CT but the scan showed new petechial hemorrhage into the R parietal infarct. Plan now to repeat head CT in 2 days (10/17) and if stable can start  heparin gtt with no bolus and stop aspirin and plavix at that time.  - Goal normotension, strict avoidance of hypotension - F/u carotid US (MRA was motion degraded) - TEE r/o vegetations/endocarditis - family deferring for now despite discussion of importance, they are concerned she is not well enough to tolerate procedure - No indication for statin patient is at goal without - Continue ASA '81mg'$  daily + plavix '75mg'$  daily - Repeat head CT in 2 days (10/17) and if stable can start heparin gtt with no bolus and stop aspirin and plavix at that time - q4 hr neuro checks - STAT head CT for any change in neuro exam - Tele - PT/OT/SLP - Stroke education - Amb referral to neurology upon discharge  - Will continue to follow  Su Monks, MD [facility] [phone]  If 7pm- 7am, please page neurology on call as listed in [place].

## 2022-02-04 NOTE — Progress Notes (Signed)
Physical Therapy Treatment Patient Details Name: Katie Bell MRN: [id number] DOB: 06/30/1940 Today's Date: 02/04/2022   History of Present Illness Katie Bell is an 70yoF who comes to [facility] on 10/4 after a fall, scans revealing of right intertrochanteric femur fracture. PMH: HTN, GAD, basal cell carcinoma, GERD, renal lithiasis. Pt taken to theatre under direction of Katie Bell for IM nail fixation, then made WBAT. At baseline pt lives with son, most household AMB without device, still drives occasionally, no recent balance or falls issues. Rapid response was called on 10/10, pt found to have acute/subacute infarcts. Additional workup revealing for urinary retention and bacteremia.    PT Comments    Supine AA/PROM x 10 BLE's - pt resisting motion at times.  Pt found to be inc large loose BM.  RN in to assist with care as full bed change is required.  Rolling left/right with max a to roll.  Pt keeps arms close to body and legs stiff making care challenging at times.  Deferred sitting EOB today due to increased pain and some anxiety from activity.   Recommendations for follow up therapy are one component of a multi-disciplinary discharge planning process, led by the attending physician.  Recommendations may be updated based on patient status, additional functional criteria and insurance authorization.  Follow Up Recommendations  Skilled nursing-short term rehab (<3 hours/day)     Assistance Recommended at Discharge Frequent or constant Supervision/Assistance  Patient can return home with the following Two people to help with walking and/or transfers;Two people to help with bathing/dressing/bathroom;Direct supervision/assist for medications management;Direct supervision/assist for financial management;Assist for transportation;Assistance with cooking/housework;Assistance with feeding;Help with stairs or ramp for entrance   Equipment Recommendations       Recommendations for Other  Services       Precautions / Restrictions Precautions Precautions: Fall Restrictions Weight Bearing Restrictions: Yes RLE Weight Bearing: Weight bearing as tolerated     Mobility  Bed Mobility Overal bed mobility: Needs Assistance Bed Mobility: Rolling Rolling: Max assist         General bed mobility comments: deferred as pt was inc large loose BM and care was provided.  pt faitgued and increased pain from activity.    Transfers                   General transfer comment: unable to safely advance.    Ambulation/Gait               General Gait Details: currently unable   Stairs             Wheelchair Mobility    Modified Rankin (Stroke Patients Only)       Balance                                            Cognition Arousal/Alertness: Awake/alert Behavior During Therapy: Restless, Anxious Overall Cognitive Status: Impaired/Different from baseline                                          Exercises Other Exercises Other Exercises: BLE PROM - limited by pain and anxiety Other Exercises: care provided and new linens for inc loose BM    General Comments        Pertinent Vitals/Pain Pain Assessment Pain Assessment:  Faces Faces Pain Scale: Hurts even more Breathing: normal Negative Vocalization: occasional moan/groan, low speech, negative/disapproving quality Facial Expression: facial grimacing Body Language: tense, distressed pacing, fidgeting Consolability: no need to console PAINAD Score: 4 Facial Expression: Grimacing Muscle Tension: Tense, rigid Pain Location: R hip Pain Descriptors / Indicators: Aching, Grimacing Pain Intervention(s): Limited activity within patient's tolerance, Monitored during session, Repositioned    Home Living                          Prior Function            PT Goals (current goals can now be found in the care plan section) Progress towards PT  goals: Not progressing toward goals - comment    Frequency    7X/week      PT Plan Current plan remains appropriate    Co-evaluation              AM-PAC PT "6 Clicks" Mobility   Outcome Measure  Help needed turning from your back to your side while in a flat bed without using bedrails?: Total Help needed moving from lying on your back to sitting on the side of a flat bed without using bedrails?: Total Help needed moving to and from a bed to a chair (including a wheelchair)?: Total Help needed standing up from a chair using your arms (e.g., wheelchair or bedside chair)?: Total Help needed to walk in hospital room?: Total Help needed climbing 3-5 steps with a railing? : Total 6 Click Score: 6    End of Session   Activity Tolerance: Patient limited by pain;Treatment limited secondary to agitation Patient left: in bed;with bed alarm set;with call bell/phone within reach;with nursing/sitter in room Nurse Communication: Mobility status PT Visit Diagnosis: Muscle weakness (generalized) (M62.81);History of falling (Z91.81)     Time: 3343-5686 PT Time Calculation (min) (ACUTE ONLY): 23 min  Charges:  $Therapeutic Exercise: 8-22 mins $Therapeutic Activity: 8-22 mins                   Katie Bell, PTA 02/04/22, 3:01 PM

## 2022-02-04 NOTE — Plan of Care (Signed)

## 2022-02-04 NOTE — Progress Notes (Addendum)
Progress Note    Katie Bell  JME:[id number] DOB: 06-Jan-1941  DOA: 01/24/2022 PCP: Venia Carbon, MD      Brief Narrative:    Medical records reviewed and are as summarized below:  Katie Bell is a 81 y.o. female medical history significant for hypertension, anxiety, history of basal cell carcinoma, GERD, kidney stones, who presented to the hospital after sustaining a mechanical fall at home.  She was found to have right hip fracture.        Assessment/Plan:   Principal Problem:   Closed right femoral fracture (HCC) Active Problems:   Hypertension   Severe sepsis ([place])   Leucocytosis   Fall at home, initial encounter   Bacteremia due to Klebsiella pneumoniae   CVA (cerebral vascular accident) ([place])   AKI (acute kidney injury) ([place])   Hypokalemia   Closed fracture of right hip ([place])   Protein-calorie malnutrition, severe   Nutrition Problem: Severe Malnutrition Etiology: social / environmental circumstances  Signs/Symptoms: severe fat depletion, severe muscle depletion   Body mass index is 21.48 kg/m.   Acute embolic stroke: Continue aspirin and Plavix.   Repeat CT head done on 02/04/2022 showed evolving right parietal infarct with probable tiny petechial hemorrhage.  Case discussed with Dr. Quinn Axe, neurologist.  She recommended repeat CT head on 02/06/2022.  2D echo showed EF estimated at 55 to 60%, indeterminate LV diastolic parameters, no evidence of vegetations.  TEE was rescheduled for 11/05/2021.  I spoke to [name], his son, on 02/03/2022.  He wants to hold off on TEE for now.  I have notified Dr. Clayborn Bigness, cardiologist  Dysphagia: She is on dysphagia 2 diet with nectar thick liquids.   Acute lower extremity (bilateral calf) DVT: 1 right peroneal vein and left posterior tibial vein DVT.  No full dose anticoagulation for now.   Findings on repeat CT head on 02/06/2022.  Decision in respect of full dose anticoagulation will be based on  findings on repeat CT head on 02/06/2022.  Severe sepsis secondary to Klebsiella pneumoniae bacteremia, severe leukocytosis, recent fever (Tmax 102.1 F) on 01/30/2022: Leukocytosis is improving.  Continue IV ceftriaxone.  Follow-up with ID for further recommendations. Urine culture showed yeast..  Lactic acid down from 3.4-2.8.  Procalcitonin 5.36.   Hypokalemia: Continue potassium repletion and monitor levels  Right hip fracture: S/p right intramedullary nail intertrochanteric on 01/25/2022.  Outpatient follow-up with Dr. Sharlet Salina, orthopedic surgeon, for staple removal and x-rays.  Acute blood loss anemia: Hemoglobin is trending down but no indication for blood transfusion at this time.  Monitor H&H.  Acute urinary retention: Foley catheter was placed on 02/01/2022 after multiple in and out urethral catheterization  Hypomagnesemia, AKI, acute hypoxic respiratory failure: Resolved:     Diet Order             DIET DYS 2 Room service appropriate? Yes with Assist; Fluid consistency: Nectar Thick  Diet effective now                            Consultants: Neurologist ID specialist Orthopedic surgeon Cardiologist  Procedures: Right intramedullary nail intertrochanteric for right hip fracture on 01/25/2022    Medications:    aspirin  81 mg Oral Daily   Chlorhexidine Gluconate Cloth  6 each Topical Daily   clopidogrel  75 mg Oral Daily   docusate sodium  100 mg Oral BID   enoxaparin (LOVENOX) injection  30 mg Subcutaneous Daily  feeding supplement (NEPRO CARB STEADY)  237 mL Oral BID BM   multivitamin with minerals  1 tablet Oral Daily   pantoprazole  40 mg Oral Daily   polyethylene glycol  17 g Oral Daily   potassium chloride  40 mEq Oral BID   Continuous Infusions:  sodium chloride 20 mL/hr at 02/03/22 1619   cefTRIAXone (ROCEPHIN)  IV Stopped (02/03/22 1534)     Anti-infectives (From admission, onward)    Start     Dose/Rate Route Frequency Ordered  Stop   01/30/22 1800  vancomycin (VANCOCIN) IVPB 1000 mg/200 mL premix        1,000 mg 200 mL/hr over 60 Minutes Intravenous  Once 01/30/22 1713 01/30/22 1945   01/30/22 1713  vancomycin variable dose per unstable renal function (pharmacist dosing)  Status:  Discontinued         Does not apply See admin instructions 01/30/22 1713 01/31/22 1339   01/30/22 1600  cefTRIAXone (ROCEPHIN) 2 g in sodium chloride 0.9 % 100 mL IVPB        2 g 200 mL/hr over 30 Minutes Intravenous Every 24 hours 01/30/22 1501     01/25/22 2200  ceFAZolin (ANCEF) IVPB 2g/100 mL premix        2 g 200 mL/hr over 30 Minutes Intravenous Every 6 hours 01/25/22 2031 01/26/22 0510   01/25/22 1700  gentamicin 80 mg in 0.9% sodium chloride 250 mL irrigation  Status:  Discontinued          As needed 01/25/22 1700 01/25/22 1712   01/25/22 1350  ceFAZolin (ANCEF) 1-4 GM/50ML-% IVPB       Note to Pharmacy: Herby Abraham W: cabinet override      01/25/22 1350 01/25/22 1624   01/25/22 0800  ceFAZolin (ANCEF) IVPB 1 g/50 mL premix        1 g 100 mL/hr over 30 Minutes Intravenous To Surgery 01/24/22 2112 01/25/22 1645              Family Communication/Anticipated D/C date and plan/Code Status   DVT prophylaxis: enoxaparin (LOVENOX) injection 30 mg Start: 02/01/22 2200 SCDs Start: 01/25/22 1708     Code Status: Full Code  Family Communication: None Disposition Plan: Plan to discharge to SNF when medically stable   Status is: Inpatient Remains inpatient appropriate because: Acute stroke, Klebsiella bacteremia       Subjective:   Interval events noted.  She cannot provide any meaningful history.  Objective:    Vitals:   02/03/22 0533 02/03/22 0700 02/03/22 1548 02/04/22 0011  BP: (!) 149/72 (!) 141/65 (!) 147/67 (!) 156/78  Pulse: 98 83 75 92  Resp: '18 18 18 16  '$ Temp:  98.1 F (36.7 C) 98 F (36.7 C) 98 F (36.7 C)  TempSrc:  Oral Oral   SpO2: 99% 99% 99%   Weight:      Height:       No data  found.   Intake/Output Summary (Last 24 hours) at 02/04/2022 1402 Last data filed at 02/03/2022 2010 Gross per 24 hour  Intake 1072.59 ml  Output 700 ml  Net 372.59 ml   Filed Weights   01/24/22 1614  Weight: 49.9 kg    Exam:  GEN: NAD SKIN: Ecchymosis on right hip.  Dressing on right hip surgical wound is intact. EYES: No pallor or icterus ENT: MMM CV: RRR PULM: CTA B ABD: soft, ND, NT, +BS CNS: Alert and oriented to person only, non focal, slurred speech, confused EXT: No  edema or tenderness     Data Reviewed:   I have personally reviewed following labs and imaging studies:  Labs: Labs show the following:   Basic Metabolic Panel: Recent Labs  Lab 01/31/22 0541 02/01/22 0629 02/02/22 0449 02/03/22 0701 02/04/22 0446  NA 141 146* 145 143 141  K 3.9 4.0 3.4* 3.0* 3.3*  CL 114* 119* 117* 114* 115*  CO2 18* 17* 20* 23 23  GLUCOSE 145* 147* 107* 139* 101*  BUN 54* 46* 25* 16 12  CREATININE 2.14* 1.23* 0.86 0.89 0.74  CALCIUM 8.0* 8.4* 8.4* 7.9* 7.7*  MG  --  2.1 1.8 1.6* 2.2  PHOS  --  2.7  --  3.4 2.9   GFR Estimated Creatinine Clearance: 38.9 mL/min (by C-G formula based on SCr of 0.74 mg/dL). Liver Function Tests: Recent Labs  Lab 02/01/22 0629 02/04/22 0446  AST 25  --   ALT 12  --   ALKPHOS 84  --   BILITOT 1.2  --   PROT 6.0*  --   ALBUMIN 2.7* 2.3*   No results for input(s): "LIPASE", "AMYLASE" in the last 168 hours. No results for input(s): "AMMONIA" in the last 168 hours. Coagulation profile No results for input(s): "INR", "PROTIME" in the last 168 hours.   CBC: Recent Labs  Lab 01/31/22 1559 02/01/22 0629 02/02/22 0449 02/03/22 0701 02/04/22 0446  WBC 46.8* 36.0* 25.3* 16.3* 19.5*  NEUTROABS 41.7* 32.4* 22.2* 13.9* 16.6*  HGB 9.3* 10.8* 9.5* 9.0* 8.6*  HCT 27.7* 32.1* 27.9* 26.5* 25.7*  MCV 96.2 95.8 95.2 94.6 94.5  PLT 100* 114* 120* 152 203   Cardiac Enzymes: No results for input(s): "CKTOTAL", "CKMB", "CKMBINDEX",  "TROPONINI" in the last 168 hours. BNP (last 3 results) No results for input(s): "PROBNP" in the last 8760 hours. CBG: Recent Labs  Lab 01/30/22 0539  GLUCAP 144*   D-Dimer: No results for input(s): "DDIMER" in the last 72 hours.  Hgb A1c: No results for input(s): "HGBA1C" in the last 72 hours.  Lipid Profile: No results for input(s): "CHOL", "HDL", "LDLCALC", "TRIG", "CHOLHDL", "LDLDIRECT" in the last 72 hours.  Thyroid function studies: No results for input(s): "TSH", "T4TOTAL", "T3FREE", "THYROIDAB" in the last 72 hours.  Invalid input(s): "FREET3" Anemia work up: No results for input(s): "VITAMINB12", "FOLATE", "FERRITIN", "TIBC", "IRON", "RETICCTPCT" in the last 72 hours. Sepsis Labs: Recent Labs  Lab 01/30/22 9211 01/30/22 0754 01/31/22 0541 02/01/22 0629 02/02/22 0449 02/03/22 0701 02/04/22 0446  PROCALCITON 5.36  --   --   --   --   --   --   WBC 12.4*  --    < > 36.0* 25.3* 16.3* 19.5*  LATICACIDVEN 3.4* 2.8*  --   --   --   --   --    < > = values in this interval not displayed.    Microbiology Recent Results (from the past 240 hour(s))  Culture, blood (Routine X 2) w Reflex to ID Panel     Status: Abnormal   Collection Time: 01/30/22  3:04 PM   Specimen: BLOOD  Result Value Ref Range Status   Specimen Description   Final    BLOOD RIGHT ANTECUBITAL Performed at [facility], [address] [place], [place] [address]    Special Requests   Final    BOTTLES DRAWN AEROBIC AND ANAEROBIC Blood Culture adequate volume Performed at [facility], [address], [place], [place] [address]    Culture  Setup Time  Final    GRAM NEGATIVE RODS IN BOTH AEROBIC AND ANAEROBIC BOTTLES CRITICAL RESULT CALLED TO, READ BACK BY AND VERIFIED WITH: BRANDON BEERS @ 1610 01/31/22 LFD CRITICAL VALUE NOTED.  VALUE IS CONSISTENT WITH PREVIOUSLY REPORTED AND CALLED VALUE.    Culture (A)  Final    KLEBSIELLA PNEUMONIAE SUSCEPTIBILITIES PERFORMED ON  PREVIOUS CULTURE WITHIN THE LAST 5 DAYS. Performed at [facility], [place] [address], [place], [place] [address]    Report Status 02/03/2022 FINAL  Final  Culture, blood (Routine X 2) w Reflex to ID Panel     Status: Abnormal   Collection Time: 01/30/22  3:04 PM   Specimen: BLOOD  Result Value Ref Range Status   Specimen Description   Final    BLOOD LEFT ANTECUBITAL Performed at [facility], [address] [place], [place] [address]    Special Requests   Final    BOTTLES DRAWN AEROBIC AND ANAEROBIC Blood Culture adequate volume Performed at [facility], [address] [place], [place] [address]    Culture  Setup Time   Final    GRAM NEGATIVE RODS IN BOTH AEROBIC AND ANAEROBIC BOTTLES Organism ID to follow CRITICAL RESULT CALLED TO, READ BACK BY AND VERIFIED WITH: BRANDON BEERS @ 8295 01/31/22 LFD Performed at [facility], [address] [place], [place] [address]    Culture KLEBSIELLA PNEUMONIAE (A)  Final   Report Status 02/03/2022 FINAL  Final   Organism ID, Bacteria KLEBSIELLA PNEUMONIAE  Final      Susceptibility   Klebsiella pneumoniae - MIC*    AMPICILLIN >=32 RESISTANT Resistant     CEFAZOLIN <=4 SENSITIVE Sensitive     CEFEPIME <=0.12 SENSITIVE Sensitive     CEFTAZIDIME <=1 SENSITIVE Sensitive     CEFTRIAXONE <=0.25 SENSITIVE Sensitive     CIPROFLOXACIN <=0.25 SENSITIVE Sensitive     GENTAMICIN <=1 SENSITIVE Sensitive     IMIPENEM <=0.25 SENSITIVE Sensitive     TRIMETH/SULFA <=20 SENSITIVE Sensitive     AMPICILLIN/SULBACTAM 8 SENSITIVE Sensitive     PIP/TAZO <=4 SENSITIVE Sensitive     * KLEBSIELLA PNEUMONIAE  Blood Culture ID Panel (Reflexed)     Status: Abnormal   Collection Time: 01/30/22  3:04 PM  Result Value Ref Range Status   Enterococcus faecalis NOT DETECTED NOT DETECTED Final   Enterococcus Faecium NOT DETECTED NOT DETECTED Final   Listeria monocytogenes NOT DETECTED NOT DETECTED Final   Staphylococcus species  NOT DETECTED NOT DETECTED Final   Staphylococcus aureus (BCID) NOT DETECTED NOT DETECTED Final   Staphylococcus epidermidis NOT DETECTED NOT DETECTED Final   Staphylococcus lugdunensis NOT DETECTED NOT DETECTED Final   Streptococcus species NOT DETECTED NOT DETECTED Final   Streptococcus agalactiae NOT DETECTED NOT DETECTED Final   Streptococcus pneumoniae NOT DETECTED NOT DETECTED Final   Streptococcus pyogenes NOT DETECTED NOT DETECTED Final   A.calcoaceticus-baumannii NOT DETECTED NOT DETECTED Final   Bacteroides fragilis NOT DETECTED NOT DETECTED Final   Enterobacterales DETECTED (A) NOT DETECTED Final    Comment: Enterobacterales represent a large order of gram negative bacteria, not a single organism. CRITICAL RESULT CALLED TO, READ BACK BY AND VERIFIED WITH: BRANDON BEERS @ 8657 01/31/22 LFD    Enterobacter cloacae complex NOT DETECTED NOT DETECTED Final   Escherichia coli NOT DETECTED NOT DETECTED Final   Klebsiella aerogenes NOT DETECTED NOT DETECTED Final   Klebsiella oxytoca NOT DETECTED NOT DETECTED Final   Klebsiella pneumoniae DETECTED (A) NOT DETECTED Final  Comment: CRITICAL RESULT CALLED TO, READ BACK BY AND VERIFIED WITH: BRANDON BEERS @ 6962 01/31/22 LFD    Proteus species NOT DETECTED NOT DETECTED Final   Salmonella species NOT DETECTED NOT DETECTED Final   Serratia marcescens NOT DETECTED NOT DETECTED Final   Haemophilus influenzae NOT DETECTED NOT DETECTED Final   Neisseria meningitidis NOT DETECTED NOT DETECTED Final   Pseudomonas aeruginosa NOT DETECTED NOT DETECTED Final   Stenotrophomonas maltophilia NOT DETECTED NOT DETECTED Final   Candida albicans NOT DETECTED NOT DETECTED Final   Candida auris NOT DETECTED NOT DETECTED Final   Candida glabrata NOT DETECTED NOT DETECTED Final   Candida krusei NOT DETECTED NOT DETECTED Final   Candida parapsilosis NOT DETECTED NOT DETECTED Final   Candida tropicalis NOT DETECTED NOT DETECTED Final   Cryptococcus  neoformans/gattii NOT DETECTED NOT DETECTED Final   CTX-M ESBL NOT DETECTED NOT DETECTED Final   Carbapenem resistance IMP NOT DETECTED NOT DETECTED Final   Carbapenem resistance KPC NOT DETECTED NOT DETECTED Final   Carbapenem resistance NDM NOT DETECTED NOT DETECTED Final   Carbapenem resist OXA 48 LIKE NOT DETECTED NOT DETECTED Final   Carbapenem resistance VIM NOT DETECTED NOT DETECTED Final    Comment: Performed at [facility], [address] [place], [place] [address]  MRSA Next Gen by PCR, Nasal     Status: None   Collection Time: 01/30/22  5:26 PM   Specimen: Nasal Mucosa; Nasal Swab  Result Value Ref Range Status   MRSA by PCR Next Gen NOT DETECTED NOT DETECTED Final    Comment: (NOTE) The GeneXpert MRSA Assay (FDA approved for NASAL specimens only), is one component of a comprehensive MRSA colonization surveillance program. It is not intended to diagnose MRSA infection nor to guide or monitor treatment for MRSA infections. Test performance is not FDA approved in patients less than 13 years old. Performed at [facility], [address] [place], [place] [address]   Urine Culture     Status: Abnormal   Collection Time: 01/30/22  8:44 PM   Specimen: Urine, Clean Catch  Result Value Ref Range Status   Specimen Description   Final    URINE, CLEAN CATCH Performed at [facility], [address], [place], [place] [address]    Special Requests   Final    NONE Performed at [facility], [place], [place] [address]    Culture 50,000 COLONIES/mL YEAST (A)  Final   Report Status 02/01/2022 FINAL  Final  Culture, blood (Routine X 2) w Reflex to ID Panel     Status: None (Preliminary result)   Collection Time: 02/02/22  4:49 AM   Specimen: BLOOD  Result Value Ref Range Status   Specimen Description BLOOD LEFT WRIST  Final   Special Requests   Final    BOTTLES DRAWN AEROBIC AND ANAEROBIC Blood Culture adequate volume    Culture   Final    NO GROWTH 2 DAYS Performed at [facility], [address], [place],  [address]    Report Status PENDING  Incomplete  Culture, blood (Routine X 2) w Reflex to ID Panel     Status: None (Preliminary result)   Collection Time: 02/02/22  6:23 AM   Specimen: Left Antecubital; Blood  Result Value Ref Range Status   Specimen Description LEFT ANTECUBITAL  Final   Special Requests   Final    BOTTLES DRAWN AEROBIC AND ANAEROBIC BACTEROIDES CACCAE   Culture  Final    NO GROWTH 2 DAYS Performed at [facility], [place], [place] [address]    Report Status PENDING  Incomplete    Procedures and diagnostic studies:  No results found.             LOS: 11 days   [place] [employer], advertising on [email or web address]. If 7PM-7AM, please contact night-coverage at [email or web address]     02/04/2022, 2:02 PM

## 2022-02-05 ENCOUNTER — Inpatient Hospital Stay: Payer: Medicare HMO

## 2022-02-05 DIAGNOSIS — A419 Sepsis, unspecified organism: Secondary | ICD-10-CM | POA: Diagnosis not present

## 2022-02-05 DIAGNOSIS — I639 Cerebral infarction, unspecified: Secondary | ICD-10-CM | POA: Diagnosis not present

## 2022-02-05 DIAGNOSIS — E876 Hypokalemia: Secondary | ICD-10-CM | POA: Diagnosis not present

## 2022-02-05 DIAGNOSIS — R7881 Bacteremia: Secondary | ICD-10-CM | POA: Diagnosis not present

## 2022-02-05 DIAGNOSIS — D72823 Leukemoid reaction: Secondary | ICD-10-CM | POA: Diagnosis not present

## 2022-02-05 DIAGNOSIS — S72141A Displaced intertrochanteric fracture of right femur, initial encounter for closed fracture: Secondary | ICD-10-CM | POA: Diagnosis not present

## 2022-02-05 DIAGNOSIS — I82443 Acute embolism and thrombosis of tibial vein, bilateral: Secondary | ICD-10-CM

## 2022-02-05 LAB — BASIC METABOLIC PANEL
Anion gap: 4 — ABNORMAL LOW (ref 5–15)
BUN: 10 mg/dL (ref 8–23)
CO2: 22 mmol/L (ref 22–32)
Calcium: 8 mg/dL — ABNORMAL LOW (ref 8.9–10.3)
Chloride: 115 mmol/L — ABNORMAL HIGH (ref 98–111)
Creatinine, Ser: 0.73 mg/dL (ref 0.44–1.00)
GFR, Estimated: >60 mL/min (ref >60)
Glucose, Bld: 111 mg/dL — ABNORMAL HIGH (ref 70–99)
Potassium: 3.9 mmol/L (ref 3.5–5.1)
Sodium: 141 mmol/L (ref 135–145)

## 2022-02-05 LAB — CBC WITH DIFFERENTIAL/PLATELET
Abs Immature Granulocytes: 0.22 10*3/uL — ABNORMAL HIGH (ref 0.00–0.07)
Basophils Absolute: 0 10*3/uL (ref 0.0–0.1)
Basophils Relative: 0 %
Eosinophils Absolute: 0.3 10*3/uL (ref 0.0–0.5)
Eosinophils Relative: 2 %
HCT: 25.8 % — ABNORMAL LOW (ref 36.0–46.0)
Hemoglobin: 8.4 g/dL — ABNORMAL LOW (ref 12.0–15.0)
Immature Granulocytes: 2 %
Lymphocytes Relative: 10 %
Lymphs Abs: 1.4 10*3/uL (ref 0.7–4.0)
MCH: 31.2 pg (ref 26.0–34.0)
MCHC: 32.6 g/dL (ref 30.0–36.0)
MCV: 95.9 fL (ref 80.0–100.0)
Monocytes Absolute: 0.9 10*3/uL (ref 0.1–1.0)
Monocytes Relative: 6 %
Neutro Abs: 11.1 10*3/uL — ABNORMAL HIGH (ref 1.7–7.7)
Neutrophils Relative %: 80 %
Platelets: 262 10*3/uL (ref 150–400)
RBC: 2.69 MIL/uL — ABNORMAL LOW (ref 3.87–5.11)
RDW: 13 % (ref 11.5–15.5)
WBC: 14 10*3/uL — ABNORMAL HIGH (ref 4.0–10.5)
nRBC: 0 % (ref 0.0–0.2)

## 2022-02-05 MED ORDER — OXYCODONE HCL 5 MG PO TABS
5.0000 mg | ORAL_TABLET | Freq: Four times a day (QID) | ORAL | Status: DC | PRN
  Administered 2022-02-05 – 2022-02-09 (×9): 5 mg via ORAL
  Filled 2022-02-05 (×9): qty 1

## 2022-02-05 MED ORDER — HALOPERIDOL 0.5 MG PO TABS: 0.5000 mg | ORAL_TABLET | Freq: Four times a day (QID) | ORAL | Status: AC | PRN

## 2022-02-05 MED ORDER — HALOPERIDOL LACTATE 5 MG/ML IJ SOLN
2.0000 mg | Freq: Four times a day (QID) | INTRAMUSCULAR | Status: AC | PRN
  Administered 2022-02-05: 2 mg via INTRAMUSCULAR
  Filled 2022-02-05: qty 1

## 2022-02-05 MED ORDER — GERHARDT'S BUTT CREAM
TOPICAL_CREAM | CUTANEOUS | Status: DC | PRN
  Administered 2022-02-06: 1 via TOPICAL
  Filled 2022-02-05: qty 1

## 2022-02-05 NOTE — TOC Progression Note (Signed)
Transition of Care [facility]) - Progression Note    Patient Details  Name: Katie Bell MRN: [id number] Date of Birth: 10-01-1940  Transition of Care [facility]) CM/SW [place], RN Phone Number: 02/05/2022, 10:38 AM  Clinical Narrative:     The patient will have a Head CT tomorrow and will not DC Til earliest Wed, Her Ins approval will expire today, will restart when medically ready to go to [facility]< I notified Tonya at [facility]  Expected Discharge Plan: [place] Barriers to Discharge: Insurance Authorization  Expected Discharge Plan and Services Expected Discharge Plan: [place] In-house Referral: Clinical Social Work                                             Social Determinants of Health ([place]) Interventions    Readmission Risk Interventions     No data to display

## 2022-02-05 NOTE — Progress Notes (Signed)
Occupational Therapy Treatment Patient Details Name: JESSELYN RASK MRN: [id number] DOB: 1940/09/07 Today's Date: 02/05/2022   History of present illness Avah Bashor is an 66yoF who comes to [facility] on 10/4 after a fall, scans revealing of right intertrochanteric femur fracture. PMH: HTN, GAD, basal cell carcinoma, GERD, renal lithiasis. Pt taken to theatre under direction of Renee Harder for IM nail fixation, then made WBAT. At baseline pt lives with son, most household AMB without device, still drives occasionally, no recent balance or falls issues. Rapid response was called on 10/10, pt found to have acute/subacute infarcts. Additional workup revealing for urinary retention and bacteremia.   OT comments  Pt seen for OT tx at end of PTA session. Pt noted with incontinent bowel episode, unaware. Pt required MAX A for log rolling and pericare from bed level with pt endorsing significant pain with any movement. Pt setup for grooming task with 1 VC for initiating. Staff coming to take for imaging/testing shortly after session. Pt continues to be limited by pain and cognition. Continue to recommend SNF at this time for continued therapy to maximize return to PLOF.    Recommendations for follow up therapy are one component of a multi-disciplinary discharge planning process, led by the attending physician.  Recommendations may be updated based on patient status, additional functional criteria and insurance authorization.    Follow Up Recommendations  Skilled nursing-short term rehab (<3 hours/day)    Assistance Recommended at Discharge Frequent or constant Supervision/Assistance  Patient can return home with the following  A lot of help with walking and/or transfers;A lot of help with bathing/dressing/bathroom;Assistance with cooking/housework;Assist for transportation;Help with stairs or ramp for entrance;Assistance with feeding;Direct supervision/assist for medications management;Direct  supervision/assist for financial management   Equipment Recommendations  BSC/3in1;Other (comment) (2WW)    Recommendations for Other Services      Precautions / Restrictions Precautions Precautions: Fall Restrictions Weight Bearing Restrictions: Yes RLE Weight Bearing: Weight bearing as tolerated       Mobility Bed Mobility Overal bed mobility: Needs Assistance Bed Mobility: Rolling Rolling: Max assist         General bed mobility comments: MAX A for rolling with pillow to support comfort, pain limited    Transfers                         Balance                                           ADL either performed or assessed with clinical judgement   ADL Overall ADL's : Needs assistance/impaired     Grooming: Bed level;Set up;Supervision/safety;Wash/dry face           Upper Body Dressing : Bed level;Maximal assistance           Toileting- Clothing Manipulation and Hygiene: Bed level;Maximal assistance              Extremity/Trunk Assessment              Vision       Perception     Praxis      Cognition Arousal/Alertness: Awake/alert Behavior During Therapy: Restless, Anxious Overall Cognitive Status: Impaired/Different from baseline  General Comments: Pt able to follow simple commands but very pain limited and anxious wiht any movement, requiring reassurance        Exercises      Shoulder Instructions       General Comments      Pertinent Vitals/ Pain       Pain Assessment Pain Assessment: Faces Faces Pain Scale: Hurts even more Pain Location: R hip with any movement Pain Descriptors / Indicators: Aching, Grimacing, Moaning, Guarding Pain Intervention(s): Limited activity within patient's tolerance, Monitored during session, Premedicated before session, Repositioned  Home Living                                          Prior  Functioning/Environment              Frequency  Min 2X/week        Progress Toward Goals  OT Goals(current goals can now be found in the care plan section)  Progress towards OT goals: Progressing toward goals  Acute Rehab OT Goals Patient Stated Goal: stand OT Goal Formulation: With patient/family Time For Goal Achievement: 02/14/22 Potential to Achieve Goals: Good  Plan Discharge plan remains appropriate;Frequency remains appropriate    Co-evaluation                 AM-PAC OT "6 Clicks" Daily Activity     Outcome Measure   Help from another person eating meals?: A Lot Help from another person taking care of personal grooming?: A Little Help from another person toileting, which includes using toliet, bedpan, or urinal?: Total Help from another person bathing (including washing, rinsing, drying)?: A Lot Help from another person to put on and taking off regular upper body clothing?: A Lot Help from another person to put on and taking off regular lower body clothing?: A Lot 6 Click Score: 12    End of Session    OT Visit Diagnosis: Other abnormalities of gait and mobility (R26.89);Muscle weakness (generalized) (M62.81);History of falling (Z91.81);Pain Pain - Right/Left: Right Pain - part of body: Hip   Activity Tolerance Patient limited by pain   Patient Left in bed;with call bell/phone within reach;with bed alarm set;with restraints reapplied   Nurse Communication          Time: 7616-0737 OT Time Calculation (min): 16 min  Charges: OT General Charges $OT Visit: 1 Visit OT Treatments $Self Care/Home Management : 8-22 mins  Ardeth Perfect., MPH, MS, OTR/L ascom [phone] 02/05/22, 10:14 AM

## 2022-02-05 NOTE — Consult Note (Signed)
[facility] Vascular Consult Note  MRN : [id number]  ALIANY FIORENZA is a 81 y.o. ([birth year]) female who presents with chief complaint of  Chief Complaint  Patient presents with   Fall  .   Consulting Physician:Bernard Mal Misty, MD Reason for consult: Deep Pauses History of Present Illness: Layann Bluett is a 81 year old female with a previous medical history for hypertension, anxiety, kidney stone, basal cell carcinoma who presented to [facility] following a fall at home and suffering a closed right femoral fracture.  During her hospitalization the patient was found to have bilateral tibial vessel DVTs on 01/31/2022.  The patient was also found to have an embolic stroke and there is concern for possible hemorrhagic conversion.  Due to this there is concern about possible long-term anticoagulation with the setting of bilateral DVT.  Unfortunately the patient is only oriented to self and unable to provide very much history.  Current Facility-Administered Medications  Medication Dose Route Frequency Provider Last Rate Last Admin   0.9 %  sodium chloride infusion   Intravenous Continuous Tristan Schroeder, PA-C 20 mL/hr at 02/03/22 1619 Infusion Verify at 02/03/22 1619   acetaminophen (TYLENOL) suppository 650 mg  650 mg Rectal Q4H PRN Ralene Muskrat B, MD   650 mg at 01/30/22 1440   acetaminophen (TYLENOL) tablet 650 mg  650 mg Oral Q6H PRN Renee Harder, MD   650 mg at 02/04/22 2255   aspirin chewable tablet 81 mg  81 mg Oral Daily Jennye Boroughs, MD   81 mg at 02/05/22 0944   cefTRIAXone (ROCEPHIN) 2 g in sodium chloride 0.9 % 100 mL IVPB  2 g Intravenous Q24H Ralene Muskrat B, MD 200 mL/hr at 02/05/22 1512 2 g at 02/05/22 1512   Chlorhexidine Gluconate Cloth 2 % PADS 6 each  6 each Topical Daily Jennye Boroughs, MD   6 each at 02/05/22 1003   clopidogrel (PLAVIX) tablet 75 mg  75 mg Oral Daily Ralene Muskrat B, MD   75 mg at  02/05/22 0944   enoxaparin (LOVENOX) injection 30 mg  30 mg Subcutaneous Daily Lorna Dibble, RPH   30 mg at 02/04/22 2255   feeding supplement (NEPRO CARB STEADY) liquid 237 mL  237 mL Oral BID BM Jennye Boroughs, MD   237 mL at 02/05/22 1329   Gerhardt's butt cream   Topical PRN Foust, Valetta Fuller L, NP   Given at 02/05/22 0354   haloperidol (HALDOL) tablet 0.5 mg  0.5 mg Oral Q6H PRN Jennye Boroughs, MD       Or   haloperidol lactate (HALDOL) injection 2 mg  2 mg Intramuscular Q6H PRN Jennye Boroughs, MD       hydrALAZINE (APRESOLINE) injection 5 mg  5 mg Intravenous Q4H PRN Sreenath, Sudheer B, MD       menthol-cetylpyridinium (CEPACOL) lozenge 3 mg  1 lozenge Oral PRN Renee Harder, MD       Or   phenol (CHLORASEPTIC) mouth spray 1 spray  1 spray Mouth/Throat PRN Renee Harder, MD       metoCLOPramide (REGLAN) tablet 5-10 mg  5-10 mg Oral Q8H PRN Renee Harder, MD       Or   metoCLOPramide (REGLAN) injection 5-10 mg  5-10 mg Intravenous Q8H PRN Renee Harder, MD       multivitamin with minerals tablet 1 tablet  1 tablet Oral Daily Ralene Muskrat B, MD   1 tablet at 02/05/22 0944   naloxone [facility]) injection 0.4  mg  0.4 mg Intravenous PRN Ralene Muskrat B, MD   0.4 mg at 01/30/22 0731   ondansetron (ZOFRAN) tablet 4 mg  4 mg Oral Q6H PRN Renee Harder, MD       Or   ondansetron [facility]) injection 4 mg  4 mg Intravenous Q6H PRN Renee Harder, MD       oxyCODONE (Oxy IR/ROXICODONE) immediate release tablet 5 mg  5 mg Oral Q6H PRN Jennye Boroughs, MD   5 mg at 02/05/22 1432   pantoprazole (PROTONIX) EC tablet 40 mg  40 mg Oral Daily Ralene Muskrat B, MD   40 mg at 02/04/22 0907    Past Medical History:  Diagnosis Date   Anxiety    Cancer ([place]) 2015   basal cell removed from leg   Depression    Episodic mood disorder ([place])    mostly anxiety--depression in the past   Gastric ulcer    History   GERD (gastroesophageal reflux disease)    History of blood  transfusion    At [facility] appro 15 yrs ago   History of hiatal hernia    History of kidney stones 1960   Hypertension    Malignant melanoma ([place]) 07/14/2014   Left proximal post lat thigh. Breslow's 0.16m, Anatomic level II. Excised 08/03/2014   Osteoarthritis, hand    hand   Postmenopausal disorder    SVD (spontaneous vaginal delivery)    x 3    Past Surgical History:  Procedure Laterality Date   CATARACT EXTRACTION W/PHACO Right 02/12/2019   Procedure: CATARACT EXTRACTION PHACO AND INTRAOCULAR LENS PLACEMENT ([place];  Surgeon: HMarchia Meiers MD;  Location: [facility] ORS;  Service: Ophthalmology;  Laterality: Right;  [place]:34.2 CDE 17.97 Fluid Pack lot # [id number]   CATARACT EXTRACTION W/PHACO Left 03/26/2019   Procedure: CATARACT EXTRACTION PHACO AND INTRAOCULAR LENS PLACEMENT ([place] VISION BLUE;  Surgeon: HMarchia Meiers MD;  Location: [facility] ORS;  Service: Ophthalmology;  Laterality: Left;  [place]01:16.2 CDE 11.70 FLUID PACK LOT # [id number]   CHOLECYSTECTOMY  2008   COLONOSCOPY  04/2011   CYSTOCELE REPAIR N/A 12/31/2013   Procedure: ANTERIOR REPAIR (CYSTOCELE);  Surgeon: TDonnamae Jude MD;  [facility];  Service: Gynecology;  Laterality: N/A;   CYSTOSCOPY W/ URETERAL STENT PLACEMENT Left 01/16/2016   Procedure: CYSTOSCOPY WITH STENT REPLACEMENT;  Surgeon: AHollice Espy MD;  Location: [facility] ORS;  Service: Urology;  Laterality: Left;   CYSTOSCOPY WITH STENT PLACEMENT Left 12/23/2015   Procedure: CYSTOSCOPY WITH STENT PLACEMENT;  Surgeon: BArdis Hughs MD;  Location: [facility] ORS;  Service: Urology;  Laterality: Left;   DIAGNOSTIC LAPAROSCOPY     EYE SURGERY     INTRAMEDULLARY (IM) NAIL INTERTROCHANTERIC Right 01/25/2022   Procedure: INTRAMEDULLARY (IM) NAIL INTERTROCHANTERIC;  Surgeon: CRenee Harder MD;  Location: [facility] ORS;  Service: Orthopedics;  Laterality: Right;   kidney stone removed     MELANOMA EXCISION  2015   left posterior thigh   TONSILLECTOMY  1944    TUBAL LIGATION     UPPER GI ENDOSCOPY     URETEROSCOPY WITH HOLMIUM LASER LITHOTRIPSY Left 01/16/2016   Procedure: URETEROSCOPY WITH HOLMIUM LASER LITHOTRIPSY;  Surgeon: AHollice Espy MD;  Location: [facility] ORS;  Service: Urology;  Laterality: Left;   WISDOM TOOTH EXTRACTION      Social History Social History   Tobacco Use   Smoking status: Former   Smokeless tobacco: Never  Substance Use Topics   Alcohol use: No    Alcohol/week: 0.0  standard drinks of alcohol   Drug use: No    Family History Family History  Problem Relation Age of Onset   Alzheimer's disease Mother    Alcohol abuse Father    COPD Brother    Heart disease Neg Hx    Cancer Neg Hx     Allergies  Allergen Reactions   Sulfamethoxazole-Trimethoprim Swelling    Angioedema and pruritis per 01/01/2017 documentation in care everywhere   Estradiol Other (See Comments)    Vaginal irritation Other reaction(s): Other (See Comments) Vaginal irritation   Morphine     agitation   Sertraline Hcl Other (See Comments)    Migraine headaches, severe   Morphine And Related Anxiety     REVIEW OF SYSTEMS (Negative unless checked)  Unable to participate due to altered mental status  Constitutional: '[]'$ Weight loss  '[]'$ Fever  '[]'$ Chills Cardiac: '[]'$ Chest pain   '[]'$ Chest pressure   '[]'$ Palpitations   '[]'$ Shortness of breath when laying flat   '[]'$ Shortness of breath at rest   '[]'$ Shortness of breath with exertion. Vascular:  '[]'$ Pain in legs with walking   '[]'$ Pain in legs at rest   '[]'$ Pain in legs when laying flat   '[]'$ Claudication   '[]'$ Pain in feet when walking  '[]'$ Pain in feet at rest  '[]'$ Pain in feet when laying flat   '[]'$ History of DVT   '[]'$ Phlebitis   '[]'$ Swelling in legs   '[]'$ Varicose veins   '[]'$ Non-healing ulcers Pulmonary:   '[]'$ Uses home oxygen   '[]'$ Productive cough   '[]'$ Hemoptysis   '[]'$ Wheeze  '[]'$ COPD   '[]'$ Asthma Neurologic:  '[]'$ Dizziness  '[]'$ Blackouts   '[]'$ Seizures   '[]'$ History of stroke   '[]'$ History of TIA  '[]'$ Aphasia   '[]'$ Temporary blindness    '[]'$ Dysphagia   '[]'$ Weakness or numbness in arms   '[]'$ Weakness or numbness in legs Musculoskeletal:  '[]'$ Arthritis   '[]'$ Joint swelling   '[]'$ Joint pain   '[]'$ Low back pain Hematologic:  '[]'$ Easy bruising  '[]'$ Easy bleeding   '[]'$ Hypercoagulable state   '[]'$ Anemic  '[]'$ Hepatitis Gastrointestinal:  '[]'$ Blood in stool   '[]'$ Vomiting blood  '[]'$ Gastroesophageal reflux/heartburn   '[]'$ Difficulty swallowing. Genitourinary:  '[]'$ Chronic kidney disease   '[]'$ Difficult urination  '[]'$ Frequent urination  '[]'$ Burning with urination   '[]'$ Blood in urine Skin:  '[]'$ Rashes   '[]'$ Ulcers   '[]'$ Wounds Psychological:  '[]'$ History of anxiety   '[]'$  History of major depression.  Physical Examination  Vitals:   02/04/22 2249 02/05/22 0335 02/05/22 0838 02/05/22 1537  BP: (!) 105/93 (!) 143/65 111/81 (!) 145/64  Pulse: 98 91 68 98  Resp: '18 20 16 16  '$ Temp: 98.3 F (36.8 C) 98.3 F (36.8 C) 98 F (36.7 C) 97.6 F (36.4 C)  TempSrc:      SpO2: 99% 99% 100%   Weight:      Height:       Body mass index is 21.48 kg/m. Gen:  WD/WN, NAD Head: [place]/AT, No temporalis wasting. Prominent temp pulse not noted. Ear/Nose/Throat: Hearing grossly intact, nares w/o erythema or drainage, oropharynx w/o Erythema/Exudate Eyes: Sclera non-icteric, conjunctiva clear Neck: Trachea midline.  No JVD.  Pulmonary:  Good air movement, respirations not labored, equal bilaterally.  Cardiac: RRR, normal S1, S2. Vascular:  Vessel Right Left  PT Palpable Palpable  DP Palpable Palpable   Gastrointestinal: soft, non-tender/non-distended. No guarding/reflex.  Musculoskeletal: M/S 5/5 throughout.  Extremities without ischemic changes.  No deformity or atrophy. No edema. Neurologic: Sensation grossly intact in extremities.  Symmetrical.  Speech is fluent. Motor exam as listed above. Psychiatric: Oriented to self only Dermatologic: Dressing right  hip  Lymph : No Cervical, Axillary, or Inguinal lymphadenopathy.    CBC Lab Results  Component Value Date   WBC 14.0 (H) 02/05/2022    HGB 8.4 (L) 02/05/2022   HCT 25.8 (L) 02/05/2022   MCV 95.9 02/05/2022   PLT 262 02/05/2022    BMET    Component Value Date/Time   NA 141 02/05/2022 0511   K 3.9 02/05/2022 0511   CL 115 (H) 02/05/2022 0511   CO2 22 02/05/2022 0511   GLUCOSE 111 (H) 02/05/2022 0511   BUN 10 02/05/2022 0511   CREATININE 0.73 02/05/2022 0511   CALCIUM 8.0 (L) 02/05/2022 0511   GFRNONAA >60 02/05/2022 0511   GFRAA >60 12/20/2019 0534   Estimated Creatinine Clearance: 38.9 mL/min (by C-G formula based on SCr of 0.73 mg/dL).  COAG Lab Results  Component Value Date   INR 1.0 01/25/2022   INR 1.0 07/12/2020   INR 1.2 12/19/2019    Radiology US Carotid Bilateral  Result Date: 02/05/2022 CLINICAL DATA:  Cerebral infarction and hypertension. EXAM: BILATERAL CAROTID DUPLEX ULTRASOUND TECHNIQUE: Pearline Cables scale imaging, color Doppler and duplex ultrasound were performed of bilateral carotid and vertebral arteries in the neck. COMPARISON:  None Available. FINDINGS: Criteria: Quantification of carotid stenosis is based on velocity parameters that correlate the residual internal carotid diameter with NASCET-based stenosis levels, using the diameter of the distal internal carotid lumen as the denominator for stenosis measurement. The following velocity measurements were obtained: RIGHT ICA:  78/23 cm/sec CCA:  41/96 cm/sec SYSTOLIC ICA/CCA RATIO:  1.0 ECA:  73 cm/sec LEFT ICA:  87/28 cm/sec CCA:  22/29 cm/sec SYSTOLIC ICA/CCA RATIO:  1.1 ECA:  61 cm/sec RIGHT CAROTID ARTERY: Mild calcified plaque at the level of the carotid bulb and proximal right ICA. Estimated right ICA stenosis is less than 50%. RIGHT VERTEBRAL ARTERY: Antegrade flow with normal waveform and velocity. LEFT CAROTID ARTERY: Mild calcified plaque at the level of the left carotid bulb and proximal left ICA. Estimated left ICA stenosis is less than 50%. LEFT VERTEBRAL ARTERY: Antegrade flow with normal waveform and velocity. IMPRESSION: Mild calcified  plaque at the level of both carotid bulbs and proximal internal carotid arteries. No significant carotid stenosis identified with estimated bilateral ICA stenoses of less than 50%. Electronically Signed   By: Aletta Edouard M.D.   On: 02/05/2022 11:32   CT HEAD WO CONTRAST (5MM)  Result Date: 02/04/2022 CLINICAL DATA:  Stroke follow-up.  Rule out hemorrhagic conversion. EXAM: CT HEAD WITHOUT CONTRAST TECHNIQUE: Contiguous axial images were obtained from the base of the skull through the vertex without intravenous contrast. RADIATION DOSE REDUCTION: This exam was performed according to the departmental dose-optimization program which includes automated exposure control, adjustment of the mA and/or kV according to patient size and/or use of iterative reconstruction technique. COMPARISON:  CT head 01/30/2022, MR head 01/30/2022 FINDINGS: Brain: Again seen is the evolving infarct in the right parietal lobe. There are small areas of hyperdensity within the infarct suspicious for tiny petechial hemorrhage common but without malignant hemorrhage hemorrhagic transformation or hematoma formation. There is no mass effect. The other acute infarcts seen on the recent brain MRI are not well seen on the current study; however, there is no evidence of hemorrhagic transformation of the other infarcts. There is no evidence of new acute territorial infarct. There is no other evidence of acute intracranial hemorrhage or extra-axial fluid collection. The ventricles are stable in size. Background chronic small-vessel ischemic change is stable. There is no mass  lesion. There is no mass effect or midline shift. Vascular: There is calcification of the bilateral carotid siphons. Skull: Normal. Negative for fracture or focal lesion. Sinuses/Orbits: The imaged paranasal sinuses are clear. Bilateral lens implants are in place. The globes and orbits are otherwise unremarkable. Other: None. IMPRESSION: 1. Evolving right parietal infarct  with probable tiny petechial hemorrhage but no hematoma formation or mass effect. The other smaller infarcts seen on the recent MRI are not well seen on the current study. 2. No new acute intracranial pathology. Electronically Signed   By: Valetta Mole M.D.   On: 02/04/2022 14:05   ECHOCARDIOGRAM COMPLETE BUBBLE STUDY  Result Date: 01/31/2022    ECHOCARDIOGRAM REPORT   Patient Name:   ALONDA WEABER Date of Exam: 01/31/2022 Medical Rec #:  [id number]        Height:       60.0 in Accession #:    [id number]       Weight:       110.0 lb Date of Birth:  24-Sep-1940         BSA:          1.448 m Patient Age:    5 years         BP:           117/58 mmHg Patient Gender: F                HR:           89 bpm. Exam Location:  [facility] Procedure: 2D Echo, Cardiac Doppler, Color Doppler and Saline Contrast Bubble            Study Indications:     Stroke 434.91 / I63.9  History:         Patient has no prior history of Echocardiogram examinations.                  Risk Factors:Hypertension.  Sonographer:     Sherrie Sport Referring Phys:  [id number] Sidney Ace Diagnosing Phys: Kate Sable MD IMPRESSIONS  1. Left ventricular ejection fraction, by estimation, is 55 to 60%. The left ventricle has normal function. The left ventricle has no regional wall motion abnormalities. Left ventricular diastolic parameters are indeterminate.  2. Right ventricular systolic function is normal. The right ventricular size is normal.  3. The mitral valve is normal in structure. No evidence of mitral valve regurgitation.  4. The aortic valve is tricuspid. Aortic valve regurgitation is not visualized. Aortic valve sclerosis is present, with no evidence of aortic valve stenosis.  5. The inferior vena cava is normal in size with greater than 50% respiratory variability, suggesting right atrial pressure of 3 mmHg.  6. Agitated saline contrast bubble study was negative, with no evidence of any interatrial shunt. FINDINGS  Left Ventricle: Left  ventricular ejection fraction, by estimation, is 55 to 60%. The left ventricle has normal function. The left ventricle has no regional wall motion abnormalities. The left ventricular internal cavity size was normal in size. There is  no left ventricular hypertrophy. Left ventricular diastolic parameters are indeterminate. Right Ventricle: The right ventricular size is normal. No increase in right ventricular wall thickness. Right ventricular systolic function is normal. Left Atrium: Left atrial size was normal in size. Right Atrium: Right atrial size was normal in size. Pericardium: There is no evidence of pericardial effusion. Mitral Valve: The mitral valve is normal in structure. No evidence of mitral valve regurgitation. Tricuspid Valve: The tricuspid valve is normal  in structure. Tricuspid valve regurgitation is trivial. Aortic Valve: The aortic valve is tricuspid. Aortic valve regurgitation is not visualized. Aortic valve sclerosis is present, with no evidence of aortic valve stenosis. Aortic valve mean gradient measures 3.0 mmHg. Aortic valve peak gradient measures 5.0  mmHg. Aortic valve area, by VTI measures 1.87 cm. Pulmonic Valve: The pulmonic valve was not well visualized. Pulmonic valve regurgitation is not visualized. Aorta: The aortic root is normal in size and structure. Venous: The inferior vena cava is normal in size with greater than 50% respiratory variability, suggesting right atrial pressure of 3 mmHg. IAS/Shunts: The interatrial septum appears to be lipomatous. No atrial level shunt detected by color flow Doppler. Agitated saline contrast was given intravenously to evaluate for intracardiac shunting. Agitated saline contrast bubble study was negative,  with no evidence of any interatrial shunt.  LEFT VENTRICLE PLAX 2D LVIDd:         3.60 cm   Diastology LVIDs:         2.20 cm   LV e' medial:    10.70 cm/s LV PW:         1.00 cm   LV E/e' medial:  6.7 LV IVS:        1.10 cm   LV e' lateral:    10.80 cm/s LVOT diam:     2.00 cm   LV E/e' lateral: 6.6 LV SV:         36 LV SV Index:   25 LVOT Area:     3.14 cm  LEFT ATRIUM             Index        RIGHT ATRIUM           Index LA diam:        3.60 cm 2.49 cm/m   RA Area:     12.90 cm LA Vol (A2C):   23.9 ml 16.51 ml/m  RA Volume:   31.90 ml  22.03 ml/m LA Vol (A4C):   17.1 ml 11.81 ml/m LA Biplane Vol: 21.9 ml 15.13 ml/m  AORTIC VALVE AV Area (Vmax):    1.81 cm AV Area (Vmean):   1.77 cm AV Area (VTI):     1.87 cm AV Vmax:           112.00 cm/s AV Vmean:          77.000 cm/s AV VTI:            0.192 m AV Peak Grad:      5.0 mmHg AV Mean Grad:      3.0 mmHg LVOT Vmax:         64.50 cm/s LVOT Vmean:        43.500 cm/s LVOT VTI:          0.114 m LVOT/AV VTI ratio: 0.59  AORTA Ao Root diam: 2.90 cm MITRAL VALVE                TRICUSPID VALVE MV Area (PHT): 4.86 cm     TR Peak grad:   17.6 mmHg MV Decel Time: 156 msec     TR Vmax:        210.00 cm/s MV E velocity: 71.30 cm/s MV A velocity: 126.00 cm/s  SHUNTS MV E/A ratio:  0.57         Systemic VTI:  0.11 m  Systemic Diam: 2.00 cm Kate Sable MD Electronically signed by Kate Sable MD Signature Date/Time: 01/31/2022/5:56:50 PM    Final    US Venous Img Lower Bilateral (DVT)  Result Date: 01/31/2022 CLINICAL DATA:  Recent surgery.  Elevated D-dimer. EXAM: BILATERAL LOWER EXTREMITY VENOUS DOPPLER ULTRASOUND TECHNIQUE: Gray-scale sonography with graded compression, as well as color Doppler and duplex ultrasound were performed to evaluate the lower extremity deep venous systems from the level of the common femoral vein and including the common femoral, femoral, profunda femoral, popliteal and calf veins including the posterior tibial, peroneal and gastrocnemius veins when visible. The superficial great saphenous vein was also interrogated. Spectral Doppler was utilized to evaluate flow at rest and with distal augmentation maneuvers in the common femoral, femoral  and popliteal veins. COMPARISON:  None Available. FINDINGS: RIGHT LOWER EXTREMITY Common Femoral Vein: No evidence of thrombus. Normal compressibility, respiratory phasicity and response to augmentation. Saphenofemoral Junction: No evidence of thrombus. Normal compressibility and flow on color Doppler imaging. Profunda Femoral Vein: No evidence of thrombus. Normal compressibility and flow on color Doppler imaging. Femoral Vein: No evidence of thrombus. Normal compressibility, respiratory phasicity and response to augmentation. Popliteal Vein: No evidence of thrombus. Normal compressibility, respiratory phasicity and response to augmentation. Calf Veins: Positive for thrombus. There is 1 peroneal vein that is not compressible and contains thrombus. The posterior tibial veins appear to be patent with normal compressibility. Other Findings:  None. LEFT LOWER EXTREMITY Common Femoral Vein: No evidence of thrombus. Normal compressibility, respiratory phasicity and response to augmentation. Saphenofemoral Junction: No evidence of thrombus. Normal compressibility and flow on color Doppler imaging. Profunda Femoral Vein: No evidence of thrombus. Normal compressibility and flow on color Doppler imaging. Femoral Vein: No evidence of thrombus. Normal compressibility, respiratory phasicity and response to augmentation. Popliteal Vein: No evidence of thrombus. Normal compressibility, respiratory phasicity and response to augmentation. Calf Veins: Positive for thrombus. There is a non compressible posterior tibial vein compatible with DVT. The second posterior tibial vein appears to be patent with normal compressibility. Left peroneal veins demonstrate normal compressibility and color Doppler flow. Other Findings:  None. IMPRESSION: 1. Positive for bilateral calf deep vein thrombosis. There is thrombus involving 1 right peroneal vein and 1 left posterior tibial vein. Electronically Signed   By: Markus Daft M.D.   On: 01/31/2022  09:45   MR ANGIO HEAD WO CONTRAST  Result Date: 01/31/2022 CLINICAL DATA:  81 year old female with neurologic deficit and patchy right greater than left hemisphere infarcts on MRI yesterday. EXAM: MRA HEAD WITHOUT CONTRAST TECHNIQUE: Angiographic images of the Circle of Willis were acquired using MRA technique without intravenous contrast. COMPARISON:  Brain MRI 01/30/2022. Neck MRA today reported separately. FINDINGS: Study is moderately degraded by motion artifact despite repeated imaging attempts. Anterior circulation: Antegrade flow in both ICA siphons, and both carotid termini appear patent, but supraclinoid ICA detail is degraded. MCA and ACA origins appear patent, but otherwise the branch detail is degraded. Posterior circulation: Antegrade flow in the posterior circulation with dominant appearing distal left vertebral artery. No distal vertebral or vertebrobasilar junction stenosis. Patent basilar artery without evidence of stenosis, although there is decreased detail of the basilar tip. PCA origins are patent. A right posterior communicating artery is visible. P1 and P2 segments appear patent but otherwise PCA branch detail is degraded. Anatomic variants: None significant are apparent. Other: No intracranial mass effect or ventriculomegaly is evident. IMPRESSION: Limited intracranial MRA. Patent ICA siphons, and no evidence of a posterior circulation large vessel  occlusion. But no other circle-of-Willis branch detail. Electronically Signed   By: Genevie Ann M.D.   On: 01/31/2022 09:20   MR ANGIO NECK WO CONTRAST  Result Date: 01/31/2022 CLINICAL DATA:  81 year old female with neurologic deficit and patchy right greater than left hemisphere infarcts on MRI yesterday. EXAM: MRA NECK WITHOUT CONTRAST TECHNIQUE: Angiographic images of the neck were acquired using MRA technique without intravenous contrast. Carotid stenosis measurements (when applicable) are obtained utilizing NASCET criteria, using the  distal internal carotid diameter as the denominator. COMPARISON:  Brain MRI 01/30/2022. FINDINGS: Study is moderately degraded by motion artifact despite repeated imaging attempts. Evidence of a 3 vessel arch configuration. Antegrade flow in both cervical carotid arteries throughout the neck and to the skull base. Degraded detail of both carotid bifurcations, but no carotid flow gaps to suggest high-grade stenosis. Similarly, degraded detail of both cervical vertebral arteries which demonstrate maintained antegrade flow signal to the skull base. IMPRESSION: Limited noncontrast neck MRA demonstrating patent cervical carotid and vertebral arteries. No high-grade stenosis is apparent. Electronically Signed   By: Genevie Ann M.D.   On: 01/31/2022 09:11   MR BRAIN W WO CONTRAST  Result Date: 01/30/2022 CLINICAL DATA:  Neuro deficit, acute, stroke suspected. EXAM: MRI HEAD WITHOUT AND WITH CONTRAST TECHNIQUE: Multiplanar, multiecho pulse sequences of the brain and surrounding structures were obtained without and with intravenous contrast. CONTRAST:  11m GADAVIST GADOBUTROL 1 MMOL/ML IV SOLN COMPARISON:  Head CT January 31, 2019. FINDINGS: The study is significantly degraded by motion. Brain: Areas of restricted diffusion are seen in the bilateral frontoparietal regions and right temporal lobe. The largest area of restricted diffusion is in the right parietal lobe where there is also interspersed areas of vasogenic edema with sulcal effacement. These areas show prominent hyperintensity on T2/FLAIR and are suggestive of subacute infarcts. No significant mass effect or midline shift. No evidence of hemorrhagic transformation. No acute hemorrhage, hydrocephalus, extra-axial collection or mass lesion. No evidence of abnormal focus of contrast enhancement, although postcontrast images are significantly degraded by motion. Vascular: Normal flow voids. Skull and upper cervical spine: Normal marrow signal. Sinuses/Orbits:  Negative. Other: None. IMPRESSION: Areas of restricted diffusion in the bilateral frontoparietal regions and right temporal lobe consistent with subacute infarcts. No evidence of hemorrhagic transformation or significant mass effect. Findings are concerning for embolic etiology. Electronically Signed   By: KPedro EarlsM.D.   On: 01/30/2022 16:42   NM Pulmonary Perfusion  Result Date: 01/30/2022 CLINICAL DATA:  Chest pain, fall EXAM: NUCLEAR MEDICINE PERFUSION LUNG SCAN TECHNIQUE: Perfusion images were obtained in multiple projections after intravenous injection of radiopharmaceutical. Ventilation scans intentionally deferred if perfusion scan and chest x-ray adequate for interpretation during COVID 19 epidemic. RADIOPHARMACEUTICALS:  4.24 mCi Tc-997mAA IV COMPARISON:  Chest radiographs done earlier today FINDINGS: There are no discrete wedge-shaped perfusion defects. There are ill-defined areas of decreased uptake in mid and lower lung fields, more so in the left side. IMPRESSION: Low probability for pulmonary embolism. Electronically Signed   By: PaElmer Picker.D.   On: 01/30/2022 16:30   CT HEAD WO CONTRAST (5MM)  Result Date: 01/30/2022 CLINICAL DATA:  Mental status change. EXAM: CT HEAD WITHOUT CONTRAST TECHNIQUE: Contiguous axial images were obtained from the base of the skull through the vertex without intravenous contrast. RADIATION DOSE REDUCTION: This exam was performed according to the departmental dose-optimization program which includes automated exposure control, adjustment of the mA and/or kV according to patient size and/or use of  iterative reconstruction technique. COMPARISON:  01/24/22 CT Brain FINDINGS: Limitations: Motion degraded exam. Brain: Compared to 01/24/2022 CT head, there is a new infarct in the right parietal lobe. No evidence of hemorrhage. No extra-axial fluid collection. No hydrocephalus. Vascular: No hyperdense vessel or unexpected calcification.  Skull: Normal. Negative for fracture or focal lesion. Sinuses/Orbits: No acute finding. Other: None. IMPRESSION: New right parietal lobe infarct. No evidence of hemorrhage. These results will be called to the ordering clinician or representative by the Radiologist Assistant, and communication documented in the PACS or [employer]. Electronically Signed   By: Marin Roberts M.D.   On: 01/30/2022 09:51   DG Chest Port 1 View  Result Date: 01/30/2022 CLINICAL DATA:  81 year old female with shortness of breath. EXAM: PORTABLE CHEST 1 VIEW COMPARISON:  Portable chest 01/24/2022 and earlier. FINDINGS: Portable AP upright view at 0547 hours. Larger lung volumes. Mediastinal contours are within normal limits. Visualized tracheal air column is within normal limits. Allowing for portable technique the lungs are clear. No pneumothorax or pleural effusion. No acute osseous abnormality identified. Negative visible bowel gas. IMPRESSION: No acute cardiopulmonary abnormality. Electronically Signed   By: Genevie Ann M.D.   On: 01/30/2022 06:56   DG HIP UNILAT WITH PELVIS 2-3 VIEWS RIGHT  Result Date: 01/25/2022 CLINICAL DATA:  Intramedullary nail of right hip. Intraoperative fluoroscopy. EXAM: DG HIP (WITH OR WITHOUT PELVIS) 2-3V RIGHT COMPARISON:  Right hip radiographs 01/24/2022 FINDINGS: Images were performed intraoperatively without the presence of a radiologist. Interval cephalomedullary nail fixation of the previously seen right intertrochanteric fracture. Improved now normal alignment. No evidence of hardware complication. Total fluoroscopy images: 2 Total fluoroscopy time: 35 seconds Total dose: Radiation Exposure Index (as provided by the fluoroscopic device): 3.189 mGy air Kerma Please see intraoperative findings for further detail. IMPRESSION: Intraoperative fluoroscopy for right intertrochanteric fracture fixation. Electronically Signed   By: Yvonne Kendall M.D.   On: 01/25/2022 18:34   DG C-Arm 1-60 Min-No  Report  Result Date: 01/25/2022 Fluoroscopy was utilized by the requesting physician.  No radiographic interpretation.   CT Head Wo Contrast  Result Date: 01/24/2022 CLINICAL DATA:  Fall EXAM: CT HEAD WITHOUT CONTRAST CT CERVICAL SPINE WITHOUT CONTRAST TECHNIQUE: Multidetector CT imaging of the head and cervical spine was performed following the standard protocol without intravenous contrast. Multiplanar CT image reconstructions of the cervical spine were also generated. RADIATION DOSE REDUCTION: This exam was performed according to the departmental dose-optimization program which includes automated exposure control, adjustment of the mA and/or kV according to patient size and/or use of iterative reconstruction technique. COMPARISON:  None Available. FINDINGS: CT HEAD FINDINGS Brain: No evidence of acute infarction, hemorrhage, hydrocephalus, extra-axial collection or mass lesion/mass effect. Subcortical white matter and periventricular small vessel ischemic changes. Vascular: Intracranial atherosclerosis. Skull: Normal. Negative for fracture or focal lesion. Sinuses/Orbits: The visualized paranasal sinuses are essentially clear. The mastoid air cells are unopacified. Other: None. CT CERVICAL SPINE FINDINGS Alignment: Normal cervical lordosis. Skull base and vertebrae: No acute fracture. No primary bone lesion or focal pathologic process. Soft tissues and spinal canal: No prevertebral fluid or swelling. No visible canal hematoma. Disc levels: Very mild degenerative changes of the mid/lower cervical spine. Spinal canal is patent. Upper chest: Visualized lung apices are clear. Other: Visualized thyroid is unremarkable. IMPRESSION: No evidence of acute intracranial abnormality. Small vessel ischemic changes. No traumatic injury to the cervical spine. Electronically Signed   By: Julian Hy M.D.   On: 01/24/2022 20:30   CT Cervical Spine  Wo Contrast  Result Date: 01/24/2022 CLINICAL DATA:  Fall EXAM: CT  HEAD WITHOUT CONTRAST CT CERVICAL SPINE WITHOUT CONTRAST TECHNIQUE: Multidetector CT imaging of the head and cervical spine was performed following the standard protocol without intravenous contrast. Multiplanar CT image reconstructions of the cervical spine were also generated. RADIATION DOSE REDUCTION: This exam was performed according to the departmental dose-optimization program which includes automated exposure control, adjustment of the mA and/or kV according to patient size and/or use of iterative reconstruction technique. COMPARISON:  None Available. FINDINGS: CT HEAD FINDINGS Brain: No evidence of acute infarction, hemorrhage, hydrocephalus, extra-axial collection or mass lesion/mass effect. Subcortical white matter and periventricular small vessel ischemic changes. Vascular: Intracranial atherosclerosis. Skull: Normal. Negative for fracture or focal lesion. Sinuses/Orbits: The visualized paranasal sinuses are essentially clear. The mastoid air cells are unopacified. Other: None. CT CERVICAL SPINE FINDINGS Alignment: Normal cervical lordosis. Skull base and vertebrae: No acute fracture. No primary bone lesion or focal pathologic process. Soft tissues and spinal canal: No prevertebral fluid or swelling. No visible canal hematoma. Disc levels: Very mild degenerative changes of the mid/lower cervical spine. Spinal canal is patent. Upper chest: Visualized lung apices are clear. Other: Visualized thyroid is unremarkable. IMPRESSION: No evidence of acute intracranial abnormality. Small vessel ischemic changes. No traumatic injury to the cervical spine. Electronically Signed   By: Julian Hy M.D.   On: 01/24/2022 20:30   DG Chest 1 View  Result Date: 01/24/2022 CLINICAL DATA:  Pt fell backwards off of first stair.Pt complaining of right hip pain and refuses any attempt to adjust for positioning. Best images obtainable EXAM: CHEST  1 VIEW COMPARISON:  12/18/2019 FINDINGS: Relatively low lung volumes. No  confluent airspace infiltrate or overt edema. Heart size and mediastinal contours are within normal limits. Aortic Atherosclerosis (ICD10-170.0). No effusion. Visualized bones unremarkable. IMPRESSION: No acute cardiopulmonary disease. Electronically Signed   By: Lucrezia Europe M.D.   On: 01/24/2022 17:01   DG Hip Unilat  With Pelvis 2-3 Views Right  Result Date: 01/24/2022 CLINICAL DATA:  Pain post fall EXAM: DG HIP (WITH OR WITHOUT PELVIS) 2-3V RIGHT COMPARISON:  CT 07/12/2020 FINDINGS: Mildly comminuted intertrochanteric fracture of the proximal right femur with varus deformity and foreshortening. No dislocation. Bony pelvis intact. Surgical clips in the left pelvis. Mild spondylitic changes in the visualized lower lumbar spine. IMPRESSION: Mildly comminuted intertrochanteric right femur fracture. Electronically Signed   By: Lucrezia Europe M.D.   On: 01/24/2022 16:50      Assessment/Plan 1. 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  Typically, we do not anticoagulate for this.  We would routinely have placed the patient on 81 mg of aspirin with close follow-up to ensure that there is no propagation.  In this instance it would be prudent to have the patient undergo a repeat study to ensure there is no propagation of her bilateral DVTs.  If there is none, she can continue with prophylactic anticoagulation and IVC filter is not necessary.  However, if there is propagation beyond the tibial vessels IVC filter would be indicated.  We will reevaluate post ultrasounds.   Plan of care discussed with Dr. Lucky Cowboy and he is in agreement with plan noted above.   Family Communication: None at bedside, will reach our if intervention required   Total Time:75 minutes I spent 75 minutes in this encounter including personally reviewing extensive medical records, personally reviewing imaging studies and compared to prior  scans, counseling the  patient, placing orders, coordinating care and performing appropriate documentation  Thank you for allowing [place] to participate in the care of this patient.   Kris Hartmann, NP [facility] [phone] (Office Phone) [phone] (Office Fax) [phone] (Pager)  02/05/2022 4:09 PM  Staff may message me via secure chat in [place]  but this may not receive immediate response,  please page for urgent matters!  Dictation software was used to generate the above note. Typos may occur and escape review, as with typed/written notes. Any error is purely unintentional.  Please contact me directly for clarity if needed.

## 2022-02-05 NOTE — Progress Notes (Signed)
       CROSS COVER NOTE  NAME: Katie Bell MRN: [id number] DOB : [birth year]    Date of Service   02/05/2022   HPI/Events of Note   Message received from RN reporting skin breakdown of patient's buttocks associated with stool incontinence.  Interventions   Assessment/Plan:  Gerhardt's Butt Cream PRN     This document was prepared using Dragon voice recognition software and may include unintentional dictation errors.  Neomia Glass DNP, MBA, FNP-BC Nurse Practitioner [facility] Pager [phone]

## 2022-02-05 NOTE — Plan of Care (Signed)

## 2022-02-05 NOTE — Progress Notes (Signed)
Progress Note    Katie Bell  RKY:[id number] DOB: 1940-06-14  DOA: 01/24/2022 PCP: Venia Carbon, MD      Brief Narrative:    Medical records reviewed and are as summarized below:  Katie Bell is a 81 y.o. female medical history significant for hypertension, anxiety, history of basal cell carcinoma, GERD, kidney stones, who presented to the hospital after sustaining a mechanical fall at home.  She was found to have right hip fracture.        Assessment/Plan:   Principal Problem:   Closed right femoral fracture (HCC) Active Problems:   Hypertension   Severe sepsis ([place])   Leucocytosis   Fall at home, initial encounter   Bacteremia due to Klebsiella pneumoniae   CVA (cerebral vascular accident) ([place])   AKI (acute kidney injury) ([place])   Hypokalemia   Closed fracture of right hip ([place])   Protein-calorie malnutrition, severe   Nutrition Problem: Severe Malnutrition Etiology: social / environmental circumstances  Signs/Symptoms: severe fat depletion, severe muscle depletion   Body mass index is 21.48 kg/m.   Acute embolic stroke: Continue aspirin and Plavix.   Repeat CT head done on 02/04/2022 showed evolving right parietal infarct with probable tiny petechial hemorrhage.  Case discussed with Dr. Quinn Axe, neurologist, on 10/15.  She recommended repeat CT head tomorrow. 2D echo showed EF estimated at 55 to 60%, indeterminate LV diastolic parameters, no evidence of vegetations.  TEE was rescheduled for 11/05/2021.  I spoke to [name], his son, on 02/03/2022.  [name] wants to hold off on TEE for now.  Dysphagia: She is on dysphagia 2 diet with nectar thick liquids.   Acute lower extremity (bilateral calf) DVT: 1 right peroneal vein and left posterior tibial vein DVT.  No full dose anticoagulation for now.   Consulted Dr. Eduard Clos surgeon, to assist with management.    Severe sepsis secondary to Klebsiella pneumoniae bacteremia, severe leukocytosis,  recent fever (Tmax 102.1 F) on 01/30/2022: Leukocytosis has improved.  Continue IV ceftriaxone and follow-up with ID.   Urine culture showed yeast.  Right hip fracture: S/p right intramedullary nail intertrochanteric on 01/25/2022.  Outpatient follow-up with Dr. Sharlet Salina, orthopedic surgeon, for staple removal and x-rays.  Acute blood loss anemia: Hemoglobin is trending down but no indication for blood transfusion at this time.  Monitor H&H.  Acute urinary retention: Foley catheter was placed on 02/01/2022 after multiple in and out urethral catheterization  Hypokalemia, hypomagnesemia, AKI, acute hypoxic respiratory failure: Resolved:     Diet Order             DIET DYS 2 Room service appropriate? Yes with Assist; Fluid consistency: Nectar Thick  Diet effective now                            Consultants: Neurologist ID specialist Orthopedic surgeon Cardiologist  Procedures: Right intramedullary nail intertrochanteric for right hip fracture on 01/25/2022    Medications:    aspirin  81 mg Oral Daily   Chlorhexidine Gluconate Cloth  6 each Topical Daily   clopidogrel  75 mg Oral Daily   enoxaparin (LOVENOX) injection  30 mg Subcutaneous Daily   feeding supplement (NEPRO CARB STEADY)  237 mL Oral BID BM   multivitamin with minerals  1 tablet Oral Daily   pantoprazole  40 mg Oral Daily   Continuous Infusions:  sodium chloride 20 mL/hr at 02/03/22 1619   cefTRIAXone (ROCEPHIN)  IV 2  g (02/04/22 1500)     Anti-infectives (From admission, onward)    Start     Dose/Rate Route Frequency Ordered Stop   01/30/22 1800  vancomycin (VANCOCIN) IVPB 1000 mg/200 mL premix        1,000 mg 200 mL/hr over 60 Minutes Intravenous  Once 01/30/22 1713 01/30/22 1945   01/30/22 1713  vancomycin variable dose per unstable renal function (pharmacist dosing)  Status:  Discontinued         Does not apply See admin instructions 01/30/22 1713 01/31/22 1339   01/30/22 1600   cefTRIAXone (ROCEPHIN) 2 g in sodium chloride 0.9 % 100 mL IVPB        2 g 200 mL/hr over 30 Minutes Intravenous Every 24 hours 01/30/22 1501     01/25/22 2200  ceFAZolin (ANCEF) IVPB 2g/100 mL premix        2 g 200 mL/hr over 30 Minutes Intravenous Every 6 hours 01/25/22 2031 01/26/22 0510   01/25/22 1700  gentamicin 80 mg in 0.9% sodium chloride 250 mL irrigation  Status:  Discontinued          As needed 01/25/22 1700 01/25/22 1712   01/25/22 1350  ceFAZolin (ANCEF) 1-4 GM/50ML-% IVPB       Note to Pharmacy: Herby Abraham W: cabinet override      01/25/22 1350 01/25/22 1624   01/25/22 0800  ceFAZolin (ANCEF) IVPB 1 g/50 mL premix        1 g 100 mL/hr over 30 Minutes Intravenous To Surgery 01/24/22 2112 01/25/22 1645              Family Communication/Anticipated D/C date and plan/Code Status   DVT prophylaxis: enoxaparin (LOVENOX) injection 30 mg Start: 02/04/22 2200 SCDs Start: 01/25/22 1708     Code Status: Full Code  Family Communication: None Disposition Plan: Plan to discharge to SNF when medically stable   Status is: Inpatient Remains inpatient appropriate because: Acute stroke, Klebsiella bacteremia       Subjective:   Interval events noted.  She is unable to provide any history.  She asked me how I was doing  Objective:    Vitals:   02/04/22 1709 02/04/22 2249 02/05/22 0335 02/05/22 0838  BP: (!) 155/77 (!) 105/93 (!) 143/65 111/81  Pulse: 98 98 91 68  Resp:  '18 20 16  '$ Temp: 98 F (36.7 C) 98.3 F (36.8 C) 98.3 F (36.8 C) 98 F (36.7 C)  TempSrc: Oral     SpO2: 96% 99% 99% 100%  Weight:      Height:       No data found.   Intake/Output Summary (Last 24 hours) at 02/05/2022 1449 Last data filed at 02/05/2022 0358 Gross per 24 hour  Intake --  Output 750 ml  Net -750 ml   Filed Weights   01/24/22 1614  Weight: 49.9 kg    Exam:  GEN: NAD SKIN: Warm and dry.  Ecchymosis on right hip.  Dressing on right hip surgical wound is  clean, dry and intact EYES: EOMI ENT: MMM CV: RRR PULM: CTA B ABD: soft, ND, NT, +BS CNS: Alert to person only.  Speech is slurred.  Non focal EXT: No edema or tenderness        Data Reviewed:   I have personally reviewed following labs and imaging studies:  Labs: Labs show the following:   Basic Metabolic Panel: Recent Labs  Lab 02/01/22 0629 02/02/22 0449 02/03/22 0701 02/04/22 0446 02/05/22 0511  NA 146*  145 143 141 141  K 4.0 3.4* 3.0* 3.3* 3.9  CL 119* 117* 114* 115* 115*  CO2 17* 20* '23 23 22  '$ GLUCOSE 147* 107* 139* 101* 111*  BUN 46* 25* '16 12 10  '$ CREATININE 1.23* 0.86 0.89 0.74 0.73  CALCIUM 8.4* 8.4* 7.9* 7.7* 8.0*  MG 2.1 1.8 1.6* 2.2  --   PHOS 2.7  --  3.4 2.9  --    GFR Estimated Creatinine Clearance: 38.9 mL/min (by C-G formula based on SCr of 0.73 mg/dL). Liver Function Tests: Recent Labs  Lab 02/01/22 0629 02/04/22 0446  AST 25  --   ALT 12  --   ALKPHOS 84  --   BILITOT 1.2  --   PROT 6.0*  --   ALBUMIN 2.7* 2.3*   No results for input(s): "LIPASE", "AMYLASE" in the last 168 hours. No results for input(s): "AMMONIA" in the last 168 hours. Coagulation profile No results for input(s): "INR", "PROTIME" in the last 168 hours.   CBC: Recent Labs  Lab 02/01/22 0629 02/02/22 0449 02/03/22 0701 02/04/22 0446 02/05/22 0511  WBC 36.0* 25.3* 16.3* 19.5* 14.0*  NEUTROABS 32.4* 22.2* 13.9* 16.6* 11.1*  HGB 10.8* 9.5* 9.0* 8.6* 8.4*  HCT 32.1* 27.9* 26.5* 25.7* 25.8*  MCV 95.8 95.2 94.6 94.5 95.9  PLT 114* 120* 152 203 262   Cardiac Enzymes: No results for input(s): "CKTOTAL", "CKMB", "CKMBINDEX", "TROPONINI" in the last 168 hours. BNP (last 3 results) No results for input(s): "PROBNP" in the last 8760 hours. CBG: Recent Labs  Lab 01/30/22 0539  GLUCAP 144*   D-Dimer: No results for input(s): "DDIMER" in the last 72 hours.  Hgb A1c: No results for input(s): "HGBA1C" in the last 72 hours.  Lipid Profile: No results for  input(s): "CHOL", "HDL", "LDLCALC", "TRIG", "CHOLHDL", "LDLDIRECT" in the last 72 hours.  Thyroid function studies: No results for input(s): "TSH", "T4TOTAL", "T3FREE", "THYROIDAB" in the last 72 hours.  Invalid input(s): "FREET3" Anemia work up: No results for input(s): "VITAMINB12", "FOLATE", "FERRITIN", "TIBC", "IRON", "RETICCTPCT" in the last 72 hours. Sepsis Labs: Recent Labs  Lab 01/30/22 9798 01/30/22 0754 01/31/22 0541 02/02/22 0449 02/03/22 0701 02/04/22 0446 02/05/22 0511  PROCALCITON 5.36  --   --   --   --   --   --   WBC 12.4*  --    < > 25.3* 16.3* 19.5* 14.0*  LATICACIDVEN 3.4* 2.8*  --   --   --   --   --    < > = values in this interval not displayed.    Microbiology Recent Results (from the past 240 hour(s))  Culture, blood (Routine X 2) w Reflex to ID Panel     Status: Abnormal   Collection Time: 01/30/22  3:04 PM   Specimen: BLOOD  Result Value Ref Range Status   Specimen Description   Final    BLOOD RIGHT ANTECUBITAL Performed at [facility], [address] [place], [place] [address]    Special Requests   Final    BOTTLES DRAWN AEROBIC AND ANAEROBIC Blood Culture adequate volume Performed at [facility], [address] [place], [place] [address]    Culture  Setup Time   Final    GRAM NEGATIVE RODS IN BOTH AEROBIC AND ANAEROBIC BOTTLES CRITICAL RESULT CALLED TO, READ BACK BY AND VERIFIED WITH: BRANDON BEERS @ 8144 01/31/22 LFD CRITICAL VALUE NOTED.  VALUE IS CONSISTENT WITH PREVIOUSLY REPORTED AND CALLED VALUE.    Culture (A)  Final    KLEBSIELLA PNEUMONIAE SUSCEPTIBILITIES PERFORMED ON PREVIOUS CULTURE WITHIN THE LAST 5 DAYS. Performed at [facility], [place] [address] [place], [place] [address]    Report Status 02/03/2022 FINAL  Final  Culture, blood (Routine X 2) w Reflex to ID Panel     Status: Abnormal   Collection Time: 01/30/22  3:04 PM   Specimen: BLOOD  Result Value Ref Range Status   Specimen Description    Final    BLOOD LEFT ANTECUBITAL Performed at [facility], [address] [place], [place] [address]    Special Requests   Final    BOTTLES DRAWN AEROBIC AND ANAEROBIC Blood Culture adequate volume Performed at [facility], [address] [place], [place] [address]    Culture  Setup Time   Final    GRAM NEGATIVE RODS IN BOTH AEROBIC AND ANAEROBIC BOTTLES Organism ID to follow CRITICAL RESULT CALLED TO, READ BACK BY AND VERIFIED WITH: BRANDON BEERS @ 9169 01/31/22 LFD Performed at [facility], [address] [place], [place] [address]    Culture KLEBSIELLA PNEUMONIAE (A)  Final   Report Status 02/03/2022 FINAL  Final   Organism ID, Bacteria KLEBSIELLA PNEUMONIAE  Final      Susceptibility   Klebsiella pneumoniae - MIC*    AMPICILLIN >=32 RESISTANT Resistant     CEFAZOLIN <=4 SENSITIVE Sensitive     CEFEPIME <=0.12 SENSITIVE Sensitive     CEFTAZIDIME <=1 SENSITIVE Sensitive     CEFTRIAXONE <=0.25 SENSITIVE Sensitive     CIPROFLOXACIN <=0.25 SENSITIVE Sensitive     GENTAMICIN <=1 SENSITIVE Sensitive     IMIPENEM <=0.25 SENSITIVE Sensitive     TRIMETH/SULFA <=20 SENSITIVE Sensitive     AMPICILLIN/SULBACTAM 8 SENSITIVE Sensitive     PIP/TAZO <=4 SENSITIVE Sensitive     * KLEBSIELLA PNEUMONIAE  Blood Culture ID Panel (Reflexed)     Status: Abnormal   Collection Time: 01/30/22  3:04 PM  Result Value Ref Range Status   Enterococcus faecalis NOT DETECTED NOT DETECTED Final   Enterococcus Faecium NOT DETECTED NOT DETECTED Final   Listeria monocytogenes NOT DETECTED NOT DETECTED Final   Staphylococcus species NOT DETECTED NOT DETECTED Final   Staphylococcus aureus (BCID) NOT DETECTED NOT DETECTED Final   Staphylococcus epidermidis NOT DETECTED NOT DETECTED Final   Staphylococcus lugdunensis NOT DETECTED NOT DETECTED Final   Streptococcus species NOT DETECTED NOT DETECTED Final   Streptococcus agalactiae NOT DETECTED NOT DETECTED Final    Streptococcus pneumoniae NOT DETECTED NOT DETECTED Final   Streptococcus pyogenes NOT DETECTED NOT DETECTED Final   A.calcoaceticus-baumannii NOT DETECTED NOT DETECTED Final   Bacteroides fragilis NOT DETECTED NOT DETECTED Final   Enterobacterales DETECTED (A) NOT DETECTED Final    Comment: Enterobacterales represent a large order of gram negative bacteria, not a single organism. CRITICAL RESULT CALLED TO, READ BACK BY AND VERIFIED WITH: BRANDON BEERS @ 8828 01/31/22 LFD    Enterobacter cloacae complex NOT DETECTED NOT DETECTED Final   Escherichia coli NOT DETECTED NOT DETECTED Final   Klebsiella aerogenes NOT DETECTED NOT DETECTED Final   Klebsiella oxytoca NOT DETECTED NOT DETECTED Final   Klebsiella pneumoniae DETECTED (A) NOT DETECTED Final    Comment: CRITICAL RESULT CALLED TO, READ BACK BY AND VERIFIED WITH: BRANDON BEERS @ 0332 01/31/22 LFD    Proteus species NOT DETECTED NOT DETECTED Final   Salmonella species NOT DETECTED NOT DETECTED Final   Serratia marcescens NOT DETECTED NOT DETECTED Final   Haemophilus  influenzae NOT DETECTED NOT DETECTED Final   Neisseria meningitidis NOT DETECTED NOT DETECTED Final   Pseudomonas aeruginosa NOT DETECTED NOT DETECTED Final   Stenotrophomonas maltophilia NOT DETECTED NOT DETECTED Final   Candida albicans NOT DETECTED NOT DETECTED Final   Candida auris NOT DETECTED NOT DETECTED Final   Candida glabrata NOT DETECTED NOT DETECTED Final   Candida krusei NOT DETECTED NOT DETECTED Final   Candida parapsilosis NOT DETECTED NOT DETECTED Final   Candida tropicalis NOT DETECTED NOT DETECTED Final   Cryptococcus neoformans/gattii NOT DETECTED NOT DETECTED Final   CTX-M ESBL NOT DETECTED NOT DETECTED Final   Carbapenem resistance IMP NOT DETECTED NOT DETECTED Final   Carbapenem resistance KPC NOT DETECTED NOT DETECTED Final   Carbapenem resistance NDM NOT DETECTED NOT DETECTED Final   Carbapenem resist OXA 48 LIKE NOT DETECTED NOT DETECTED Final    Carbapenem resistance VIM NOT DETECTED NOT DETECTED Final    Comment: Performed at [facility], [address] [place], [place] [address]  MRSA Next Gen by PCR, Nasal     Status: None   Collection Time: 01/30/22  5:26 PM   Specimen: Nasal Mucosa; Nasal Swab  Result Value Ref Range Status   MRSA by PCR Next Gen NOT DETECTED NOT DETECTED Final    Comment: (NOTE) The GeneXpert MRSA Assay (FDA approved for NASAL specimens only), is one component of a comprehensive MRSA colonization surveillance program. It is not intended to diagnose MRSA infection nor to guide or monitor treatment for MRSA infections. Test performance is not FDA approved in patients less than 71 years old. Performed at [facility], [address], [place], [place] [address]   Urine Culture     Status: Abnormal   Collection Time: 01/30/22  8:44 PM   Specimen: Urine, Clean Catch  Result Value Ref Range Status   Specimen Description   Final    URINE, CLEAN CATCH Performed at [facility], [address], [place], [place] [address]    Special Requests   Final    NONE Performed at [facility], [place], [place] [address]    Culture 50,000 COLONIES/mL YEAST (A)  Final   Report Status 02/01/2022 FINAL  Final  Culture, blood (Routine X 2) w Reflex to ID Panel     Status: None (Preliminary result)   Collection Time: 02/02/22  4:49 AM   Specimen: BLOOD  Result Value Ref Range Status   Specimen Description BLOOD LEFT WRIST  Final   Special Requests   Final    BOTTLES DRAWN AEROBIC AND ANAEROBIC Blood Culture adequate volume   Culture   Final    NO GROWTH 3 DAYS Performed at [facility], [address], [place], [place] [address]    Report Status PENDING  Incomplete  Culture, blood (Routine X 2) w Reflex to ID Panel     Status: None (Preliminary result)   Collection Time: 02/02/22  6:23 AM   Specimen: Left Antecubital; Blood  Result Value Ref Range  Status   Specimen Description LEFT ANTECUBITAL  Final   Special Requests   Final    BOTTLES DRAWN AEROBIC AND ANAEROBIC BACTEROIDES CACCAE   Culture   Final    NO GROWTH 3 DAYS Performed at [facility], [address], [place], [place] [address]    Report Status PENDING  Incomplete    Procedures and diagnostic studies:  US Carotid Bilateral  Result Date: 02/05/2022 CLINICAL DATA:  Cerebral infarction  and hypertension. EXAM: BILATERAL CAROTID DUPLEX ULTRASOUND TECHNIQUE: Pearline Cables scale imaging, color Doppler and duplex ultrasound were performed of bilateral carotid and vertebral arteries in the neck. COMPARISON:  None Available. FINDINGS: Criteria: Quantification of carotid stenosis is based on velocity parameters that correlate the residual internal carotid diameter with NASCET-based stenosis levels, using the diameter of the distal internal carotid lumen as the denominator for stenosis measurement. The following velocity measurements were obtained: RIGHT ICA:  78/23 cm/sec CCA:  70/62 cm/sec SYSTOLIC ICA/CCA RATIO:  1.0 ECA:  73 cm/sec LEFT ICA:  87/28 cm/sec CCA:  37/62 cm/sec SYSTOLIC ICA/CCA RATIO:  1.1 ECA:  61 cm/sec RIGHT CAROTID ARTERY: Mild calcified plaque at the level of the carotid bulb and proximal right ICA. Estimated right ICA stenosis is less than 50%. RIGHT VERTEBRAL ARTERY: Antegrade flow with normal waveform and velocity. LEFT CAROTID ARTERY: Mild calcified plaque at the level of the left carotid bulb and proximal left ICA. Estimated left ICA stenosis is less than 50%. LEFT VERTEBRAL ARTERY: Antegrade flow with normal waveform and velocity. IMPRESSION: Mild calcified plaque at the level of both carotid bulbs and proximal internal carotid arteries. No significant carotid stenosis identified with estimated bilateral ICA stenoses of less than 50%. Electronically Signed   By: Aletta Edouard M.D.   On: 02/05/2022 11:32   CT HEAD WO CONTRAST (5MM)  Result Date:  02/04/2022 CLINICAL DATA:  Stroke follow-up.  Rule out hemorrhagic conversion. EXAM: CT HEAD WITHOUT CONTRAST TECHNIQUE: Contiguous axial images were obtained from the base of the skull through the vertex without intravenous contrast. RADIATION DOSE REDUCTION: This exam was performed according to the departmental dose-optimization program which includes automated exposure control, adjustment of the mA and/or kV according to patient size and/or use of iterative reconstruction technique. COMPARISON:  CT head 01/30/2022, MR head 01/30/2022 FINDINGS: Brain: Again seen is the evolving infarct in the right parietal lobe. There are small areas of hyperdensity within the infarct suspicious for tiny petechial hemorrhage common but without malignant hemorrhage hemorrhagic transformation or hematoma formation. There is no mass effect. The other acute infarcts seen on the recent brain MRI are not well seen on the current study; however, there is no evidence of hemorrhagic transformation of the other infarcts. There is no evidence of new acute territorial infarct. There is no other evidence of acute intracranial hemorrhage or extra-axial fluid collection. The ventricles are stable in size. Background chronic small-vessel ischemic change is stable. There is no mass lesion. There is no mass effect or midline shift. Vascular: There is calcification of the bilateral carotid siphons. Skull: Normal. Negative for fracture or focal lesion. Sinuses/Orbits: The imaged paranasal sinuses are clear. Bilateral lens implants are in place. The globes and orbits are otherwise unremarkable. Other: None. IMPRESSION: 1. Evolving right parietal infarct with probable tiny petechial hemorrhage but no hematoma formation or mass effect. The other smaller infarcts seen on the recent MRI are not well seen on the current study. 2. No new acute intracranial pathology. Electronically Signed   By: Valetta Mole M.D.   On: 02/04/2022 14:05                LOS: 12 days   [facility]   Pager on [email or web address]. If 7PM-7AM, please contact night-coverage at [email or web address]     02/05/2022, 2:49 PM

## 2022-02-05 NOTE — Progress Notes (Signed)
Date of Admission:  01/24/2022     ID: Katie Bell is a 81 y.o. female  Principal Problem:   Closed right femoral fracture (HCC) Active Problems:   Hypertension   Severe sepsis ([place])   Leucocytosis   Fall at home, initial encounter   Bacteremia due to Klebsiella pneumoniae   CVA (cerebral vascular accident) ([place])   AKI (acute kidney injury) ([place])   Hypokalemia   Closed fracture of right hip (HCC)   Protein-calorie malnutrition, severe    Subjective: Says she is feeling okay  Medications:   aspirin  81 mg Oral Daily   Chlorhexidine Gluconate Cloth  6 each Topical Daily   clopidogrel  75 mg Oral Daily   enoxaparin (LOVENOX) injection  30 mg Subcutaneous Daily   feeding supplement (NEPRO CARB STEADY)  237 mL Oral BID BM   multivitamin with minerals  1 tablet Oral Daily   pantoprazole  40 mg Oral Daily    Objective: Vital signs in last 24 hours: Patient Vitals for the past 24 hrs:  BP Temp Temp src Pulse Resp SpO2  02/05/22 0838 111/81 98 F (36.7 C) -- 68 16 100 %  02/05/22 0335 (!) 143/65 98.3 F (36.8 C) -- 91 20 99 %  02/04/22 2249 (!) 105/93 98.3 F (36.8 C) -- 98 18 99 %  02/04/22 1709 (!) 155/77 98 F (36.7 C) Oral 98 -- 96 %     PHYSICAL EXAM:  General: awake, less confused, speech is better  Lungs: b/l air entry Heart: s1s2 Abdomen: Soft, non-tender,not distended. Bowel sounds normal. No masses Neurologic: moves both extremities Rt thigh ecchymosis  Lab Results Recent Labs    02/04/22 0446 02/05/22 0511  WBC 19.5* 14.0*  HGB 8.6* 8.4*  HCT 25.7* 25.8*  NA 141 141  K 3.3* 3.9  CL 115* 115*  CO2 23 22  BUN 12 10  CREATININE 0.74 0.73   Liver Panel Recent Labs    02/04/22 0446  ALBUMIN 2.3*  Microbiology: [facility]- klebsiella  Studies/Results: US Carotid Bilateral  Result Date: 02/05/2022 CLINICAL DATA:  Cerebral infarction and hypertension. EXAM: BILATERAL CAROTID DUPLEX ULTRASOUND TECHNIQUE: Pearline Cables scale imaging, color Doppler and  duplex ultrasound were performed of bilateral carotid and vertebral arteries in the neck. COMPARISON:  None Available. FINDINGS: Criteria: Quantification of carotid stenosis is based on velocity parameters that correlate the residual internal carotid diameter with NASCET-based stenosis levels, using the diameter of the distal internal carotid lumen as the denominator for stenosis measurement. The following velocity measurements were obtained: RIGHT ICA:  78/23 cm/sec CCA:  25/42 cm/sec SYSTOLIC ICA/CCA RATIO:  1.0 ECA:  73 cm/sec LEFT ICA:  87/28 cm/sec CCA:  70/62 cm/sec SYSTOLIC ICA/CCA RATIO:  1.1 ECA:  61 cm/sec RIGHT CAROTID ARTERY: Mild calcified plaque at the level of the carotid bulb and proximal right ICA. Estimated right ICA stenosis is less than 50%. RIGHT VERTEBRAL ARTERY: Antegrade flow with normal waveform and velocity. LEFT CAROTID ARTERY: Mild calcified plaque at the level of the left carotid bulb and proximal left ICA. Estimated left ICA stenosis is less than 50%. LEFT VERTEBRAL ARTERY: Antegrade flow with normal waveform and velocity. IMPRESSION: Mild calcified plaque at the level of both carotid bulbs and proximal internal carotid arteries. No significant carotid stenosis identified with estimated bilateral ICA stenoses of less than 50%. Electronically Signed   By: Aletta Edouard M.D.   On: 02/05/2022 11:32   CT HEAD WO CONTRAST (5MM)  Result Date: 02/04/2022 CLINICAL DATA:  Stroke follow-up.  Rule out hemorrhagic conversion. EXAM: CT HEAD WITHOUT CONTRAST TECHNIQUE: Contiguous axial images were obtained from the base of the skull through the vertex without intravenous contrast. RADIATION DOSE REDUCTION: This exam was performed according to the departmental dose-optimization program which includes automated exposure control, adjustment of the mA and/or kV according to patient size and/or use of iterative reconstruction technique. COMPARISON:  CT head 01/30/2022, MR head 01/30/2022 FINDINGS:  Brain: Again seen is the evolving infarct in the right parietal lobe. There are small areas of hyperdensity within the infarct suspicious for tiny petechial hemorrhage common but without malignant hemorrhage hemorrhagic transformation or hematoma formation. There is no mass effect. The other acute infarcts seen on the recent brain MRI are not well seen on the current study; however, there is no evidence of hemorrhagic transformation of the other infarcts. There is no evidence of new acute territorial infarct. There is no other evidence of acute intracranial hemorrhage or extra-axial fluid collection. The ventricles are stable in size. Background chronic small-vessel ischemic change is stable. There is no mass lesion. There is no mass effect or midline shift. Vascular: There is calcification of the bilateral carotid siphons. Skull: Normal. Negative for fracture or focal lesion. Sinuses/Orbits: The imaged paranasal sinuses are clear. Bilateral lens implants are in place. The globes and orbits are otherwise unremarkable. Other: None. IMPRESSION: 1. Evolving right parietal infarct with probable tiny petechial hemorrhage but no hematoma formation or mass effect. The other smaller infarcts seen on the recent MRI are not well seen on the current study. 2. No new acute intracranial pathology. Electronically Signed   By: Valetta Mole M.D.   On: 02/04/2022 14:05     Assessment/Plan: ?Klebsiella bacteremia -likely Hospital acquired- no blood culture was done on admission Source- unclear- UTI ruled out - foley was there for a few hours for surgery and was removed-  was replaced due to urinary retention Surgical site- other than ecchymosis no signs of infection Continue Ceftriaxone Repeat BC NG     CVA-  TEE to r/o embolic source from valve /endocarditis was recommended by neuro Family does not want it now  Fall- with fracture rt femur - s/p intertrochanteric nailing     Severe leucocytosis- leukemoid reaction,  much improved ? ecchymosis, b/l popliteal vein DVT or other cause   Anemia  Thrombocytopenia resolved  Discussed the management with the care team

## 2022-02-05 NOTE — Progress Notes (Signed)
Physical Therapy Treatment Patient Details Name: Katie Bell MRN: [id number] DOB: 05-10-1940 Today's Date: 02/05/2022   History of Present Illness Katie Bell is an 17yoF who [facility] after a fall, scans revealing of right intertrochanteric femur fracture. PMH: HTN, GAD, basal cell carcinoma, GERD, renal lithiasis. Pt taken to theatre under direction of Renee Harder for IM nail fixation, then made WBAT. At baseline pt lives with son, most household AMB without device, still drives occasionally, no recent balance or falls issues. Rapid response was called on 10/10, pt found to have acute/subacute infarcts. Additional workup revealing for urinary retention and bacteremia.    PT Comments    BLE AA.PROM x 10 - pt resisting and fighting ROM at times, asking me to "stop pulling on my let"  slow gentle movements given.  During ex, it was noted that she is inc loose BM and in need of care.  OT arrived and stayed to assist OT with care as transport was awaiting to take pt to ultrasound.  Pt resisting and yelling out during care.  Max assist needed with no assist from pt for turning or following cues due to pain/fear.   Recommendations for follow up therapy are one component of a multi-disciplinary discharge planning process, led by the attending physician.  Recommendations may be updated based on patient status, additional functional criteria and insurance authorization.  Follow Up Recommendations  Skilled nursing-short term rehab (<3 hours/day)     Assistance Recommended at Discharge Frequent or constant Supervision/Assistance  Patient can return home with the following Two people to help with walking and/or transfers;Two people to help with bathing/dressing/bathroom;Direct supervision/assist for medications management;Direct supervision/assist for financial management;Assist for transportation;Assistance with cooking/housework;Assistance with feeding;Help with stairs or ramp for  entrance   Equipment Recommendations       Recommendations for Other Services       Precautions / Restrictions Precautions Precautions: Fall Restrictions RLE Weight Bearing: Weight bearing as tolerated     Mobility  Bed Mobility Overal bed mobility: Needs Assistance Bed Mobility: Rolling Rolling: Max assist              Transfers                   General transfer comment: unable to safely advance.    Ambulation/Gait               General Gait Details: currently unable   Stairs             Wheelchair Mobility    Modified Rankin (Stroke Patients Only)       Balance                                            Cognition Arousal/Alertness: Awake/alert Behavior During Therapy: Restless, Anxious Overall Cognitive Status: Impaired/Different from baseline                                          Exercises Other Exercises Other Exercises: BLE PROM - limited by pain and anxiety Other Exercises: care provided and new linens for inc loose BM    General Comments        Pertinent Vitals/Pain Pain Assessment Pain Assessment: Faces Faces Pain Scale: Hurts whole lot Pain Location: R  hip with any movement - tenses, resists and yells out to stop Pain Descriptors / Indicators: Aching, Grimacing, Moaning, Guarding Pain Intervention(s): Limited activity within patient's tolerance, Monitored during session, Repositioned    Home Living                          Prior Function            PT Goals (current goals can now be found in the care plan section) Progress towards PT goals: Not progressing toward goals - comment    Frequency    7X/week      PT Plan Current plan remains appropriate    Co-evaluation              AM-PAC PT "6 Clicks" Mobility   Outcome Measure  Help needed turning from your back to your side while in a flat bed without using bedrails?: Total Help needed  moving from lying on your back to sitting on the side of a flat bed without using bedrails?: Total Help needed moving to and from a bed to a chair (including a wheelchair)?: Total Help needed standing up from a chair using your arms (e.g., wheelchair or bedside chair)?: Total Help needed to walk in hospital room?: Total Help needed climbing 3-5 steps with a railing? : Total 6 Click Score: 6    End of Session     Patient left: in bed;with bed alarm set;with call bell/phone within reach Nurse Communication: Mobility status PT Visit Diagnosis: Muscle weakness (generalized) (M62.81);History of falling (Z91.81)     Time: 7062-3762 PT Time Calculation (min) (ACUTE ONLY): 10 min  Charges:  $Therapeutic Exercise: 8-22 mins                   Chesley Noon, PTA 02/05/22, 1:42 PM

## 2022-02-06 ENCOUNTER — Inpatient Hospital Stay: Payer: Medicare HMO

## 2022-02-06 DIAGNOSIS — S72141A Displaced intertrochanteric fracture of right femur, initial encounter for closed fracture: Secondary | ICD-10-CM | POA: Diagnosis not present

## 2022-02-06 DIAGNOSIS — I82443 Acute embolism and thrombosis of tibial vein, bilateral: Secondary | ICD-10-CM | POA: Diagnosis not present

## 2022-02-06 DIAGNOSIS — I639 Cerebral infarction, unspecified: Secondary | ICD-10-CM | POA: Diagnosis not present

## 2022-02-06 DIAGNOSIS — S72001A Fracture of unspecified part of neck of right femur, initial encounter for closed fracture: Secondary | ICD-10-CM | POA: Diagnosis not present

## 2022-02-06 DIAGNOSIS — R7881 Bacteremia: Secondary | ICD-10-CM | POA: Diagnosis not present

## 2022-02-06 DIAGNOSIS — I82412 Acute embolism and thrombosis of left femoral vein: Secondary | ICD-10-CM

## 2022-02-06 MED ORDER — APIXABAN 5 MG PO TABS
5.0000 mg | ORAL_TABLET | Freq: Two times a day (BID) | ORAL | Status: DC
  Administered 2022-02-06 – 2022-02-09 (×6): 5 mg via ORAL
  Filled 2022-02-06 (×6): qty 1

## 2022-02-06 NOTE — Progress Notes (Addendum)
Progress Note    Katie Bell  XAJ:[id number] DOB: Feb 26, 1941  DOA: 01/24/2022 PCP: Venia Carbon, MD      Brief Narrative:    Medical records reviewed and are as summarized below:  Katie Bell is a 81 y.o. female medical history significant for hypertension, anxiety, history of basal cell carcinoma, GERD, kidney stones, who presented to the hospital after sustaining a mechanical fall at home.  She was found to have right hip fracture.        Assessment/Plan:   Principal Problem:   Closed right femoral fracture (HCC) Active Problems:   Hypertension   Severe sepsis ([place])   Leucocytosis   Fall at home, initial encounter   Bacteremia due to Klebsiella pneumoniae   CVA (cerebral vascular accident) ([place])   AKI (acute kidney injury) ([place])   Hypokalemia   Closed fracture of right hip ([place])   Protein-calorie malnutrition, severe   Acute deep vein thrombosis (DVT) of tibial vein of both lower extremities (HCC)   Acute deep vein thrombosis (DVT) of left femoral vein (HCC)   Nutrition Problem: Severe Malnutrition Etiology: social / environmental circumstances  Signs/Symptoms: severe fat depletion, severe muscle depletion   Body mass index is 21.48 kg/m.   Acute embolic stroke: Repeat CT head on 02/06/2022 showed evolving right parietal infarct with associated petechial hemorrhage and no evidence of parenchymal hematoma.  Case was discussed with Dr. Rory Percy (neurologist) and Dr. Lucky Cowboy (vascular surgeon) via group secure chat.  Dr. Rory Percy is okay with initiation of anticoagulation but he recommended dual antiplatelet therapy with aspirin and Plavix be discontinued.    2D echo showed EF estimated at 55 to 60%, indeterminate LV diastolic parameters, no evidence of vegetations.  [name], son, declines TEE at this time.  Dysphagia: She is on dysphagia 2 diet with nectar thick liquids.   Acute lower extremity (bilateral calf) DVT: 1 right peroneal vein and left  posterior tibial vein DVT.  Repeat venous duplex of the lower extremities on 02/05/2022 showed left common femoral vein thrombosis extending up to the saphenofemoral junction. Dr. Lucky Cowboy, vascular surgeon, recommended Eliquis 5 mg twice daily for treatment.  No need for IVC filter since patient can be anticoagulated per neurologist.   Severe sepsis secondary to Klebsiella pneumoniae bacteremia, severe leukocytosis, recent fever (Tmax 102.1 F) on 01/30/2022: Leukocytosis has improved.  She remains on IV ceftriaxone.  Follow-up with ID for further recommendations. Urine culture showed yeast.  Right hip fracture: S/p right intramedullary nail intertrochanteric on 01/25/2022.  Outpatient follow-up with Dr. Sharlet Salina, orthopedic surgeon, for staple removal and x-rays.  Acute blood loss anemia: Repeat CBC tomorrow.  Acute urinary retention: Foley catheter was placed on 02/01/2022 after multiple in and out urethral catheterization  Hypokalemia, hypomagnesemia, AKI, acute hypoxic respiratory failure: Resolved:    Plan of care was discussed with [name], son, over the phone.  Risk and benefits of long-term anticoagulation were discussed.  He is okay with Eliquis.   Diet Order             DIET DYS 2 Room service appropriate? Yes with Assist; Fluid consistency: Nectar Thick  Diet effective now                            Consultants: Neurologist ID specialist Orthopedic surgeon Cardiologist  Procedures: Right intramedullary nail intertrochanteric for right hip fracture on 01/25/2022    Medications:    apixaban  5 mg Oral  BID   aspirin  81 mg Oral Daily   Chlorhexidine Gluconate Cloth  6 each Topical Daily   clopidogrel  75 mg Oral Daily   feeding supplement (NEPRO CARB STEADY)  237 mL Oral BID BM   multivitamin with minerals  1 tablet Oral Daily   pantoprazole  40 mg Oral Daily   Continuous Infusions:  sodium chloride 20 mL/hr at 02/03/22 1619   cefTRIAXone (ROCEPHIN)  IV 2  g (02/05/22 1512)     Anti-infectives (From admission, onward)    Start     Dose/Rate Route Frequency Ordered Stop   01/30/22 1800  vancomycin (VANCOCIN) IVPB 1000 mg/200 mL premix        1,000 mg 200 mL/hr over 60 Minutes Intravenous  Once 01/30/22 1713 01/30/22 1945   01/30/22 1713  vancomycin variable dose per unstable renal function (pharmacist dosing)  Status:  Discontinued         Does not apply See admin instructions 01/30/22 1713 01/31/22 1339   01/30/22 1600  cefTRIAXone (ROCEPHIN) 2 g in sodium chloride 0.9 % 100 mL IVPB        2 g 200 mL/hr over 30 Minutes Intravenous Every 24 hours 01/30/22 1501     01/25/22 2200  ceFAZolin (ANCEF) IVPB 2g/100 mL premix        2 g 200 mL/hr over 30 Minutes Intravenous Every 6 hours 01/25/22 2031 01/26/22 0510   01/25/22 1700  gentamicin 80 mg in 0.9% sodium chloride 250 mL irrigation  Status:  Discontinued          As needed 01/25/22 1700 01/25/22 1712   01/25/22 1350  ceFAZolin (ANCEF) 1-4 GM/50ML-% IVPB       Note to Pharmacy: Herby Abraham W: cabinet override      01/25/22 1350 01/25/22 1624   01/25/22 0800  ceFAZolin (ANCEF) IVPB 1 g/50 mL premix        1 g 100 mL/hr over 30 Minutes Intravenous To Surgery 01/24/22 2112 01/25/22 1645              Family Communication/Anticipated D/C date and plan/Code Status   DVT prophylaxis: SCDs Start: 01/25/22 1708 apixaban (ELIQUIS) tablet 5 mg     Code Status: Full Code  Family Communication: None Disposition Plan: Plan to discharge to SNF when medically stable   Status is: Inpatient Remains inpatient appropriate because: Acute stroke, Klebsiella bacteremia       Subjective:   Interval events noted.  She cannot provide any history because of confusion.  Objective:    Vitals:   02/05/22 1537 02/05/22 2334 02/06/22 0430 02/06/22 0858  BP: (!) 145/64 125/65 135/78 (!) 119/106  Pulse: 98 95 79 99  Resp: '16 16 16 17  '$ Temp: 97.6 F (36.4 C) 97.8 F (36.6 C) 99.9 F  (37.7 C) 98.6 F (37 C)  TempSrc:   Oral   SpO2:  98% 98% 100%  Weight:      Height:       No data found.   Intake/Output Summary (Last 24 hours) at 02/06/2022 1358 Last data filed at 02/06/2022 0522 Gross per 24 hour  Intake --  Output 1550 ml  Net -1550 ml   Filed Weights   01/24/22 1614  Weight: 49.9 kg    Exam:  GEN: NAD SKIN: Warm and dry.  Ecchymosis on right hip.  Dressing on right hip surgical wound is clean, dry, dry and intact EYES: No pallor or icterus ENT: MMM CV: RRR PULM: CTA B  ABD: soft, ND, NT, +BS CNS: Alert but disoriented, slurred speech, nonfocal EXT: No edema or tenderness       Data Reviewed:   I have personally reviewed following labs and imaging studies:  Labs: Labs show the following:   Basic Metabolic Panel: Recent Labs  Lab 02/01/22 0629 02/02/22 0449 02/03/22 0701 02/04/22 0446 02/05/22 0511  NA 146* 145 143 141 141  K 4.0 3.4* 3.0* 3.3* 3.9  CL 119* 117* 114* 115* 115*  CO2 17* 20* '23 23 22  '$ GLUCOSE 147* 107* 139* 101* 111*  BUN 46* 25* '16 12 10  '$ CREATININE 1.23* 0.86 0.89 0.74 0.73  CALCIUM 8.4* 8.4* 7.9* 7.7* 8.0*  MG 2.1 1.8 1.6* 2.2  --   PHOS 2.7  --  3.4 2.9  --    GFR Estimated Creatinine Clearance: 38.9 mL/min (by C-G formula based on SCr of 0.73 mg/dL). Liver Function Tests: Recent Labs  Lab 02/01/22 0629 02/04/22 0446  AST 25  --   ALT 12  --   ALKPHOS 84  --   BILITOT 1.2  --   PROT 6.0*  --   ALBUMIN 2.7* 2.3*   No results for input(s): "LIPASE", "AMYLASE" in the last 168 hours. No results for input(s): "AMMONIA" in the last 168 hours. Coagulation profile No results for input(s): "INR", "PROTIME" in the last 168 hours.   CBC: Recent Labs  Lab 02/01/22 0629 02/02/22 0449 02/03/22 0701 02/04/22 0446 02/05/22 0511  WBC 36.0* 25.3* 16.3* 19.5* 14.0*  NEUTROABS 32.4* 22.2* 13.9* 16.6* 11.1*  HGB 10.8* 9.5* 9.0* 8.6* 8.4*  HCT 32.1* 27.9* 26.5* 25.7* 25.8*  MCV 95.8 95.2 94.6 94.5 95.9   PLT 114* 120* 152 203 262   Cardiac Enzymes: No results for input(s): "CKTOTAL", "CKMB", "CKMBINDEX", "TROPONINI" in the last 168 hours. BNP (last 3 results) No results for input(s): "PROBNP" in the last 8760 hours. CBG: No results for input(s): "GLUCAP" in the last 168 hours.  D-Dimer: No results for input(s): "DDIMER" in the last 72 hours.  Hgb A1c: No results for input(s): "HGBA1C" in the last 72 hours.  Lipid Profile: No results for input(s): "CHOL", "HDL", "LDLCALC", "TRIG", "CHOLHDL", "LDLDIRECT" in the last 72 hours.  Thyroid function studies: No results for input(s): "TSH", "T4TOTAL", "T3FREE", "THYROIDAB" in the last 72 hours.  Invalid input(s): "FREET3" Anemia work up: No results for input(s): "VITAMINB12", "FOLATE", "FERRITIN", "TIBC", "IRON", "RETICCTPCT" in the last 72 hours. Sepsis Labs: Recent Labs  Lab 02/02/22 0449 02/03/22 0701 02/04/22 0446 02/05/22 0511  WBC 25.3* 16.3* 19.5* 14.0*    Microbiology Recent Results (from the past 240 hour(s))  Culture, blood (Routine X 2) w Reflex to ID Panel     Status: Abnormal   Collection Time: 01/30/22  3:04 PM   Specimen: BLOOD  Result Value Ref Range Status   Specimen Description   Final    BLOOD RIGHT ANTECUBITAL Performed at [facility], [address], [place], [place] [address]    Special Requests   Final    BOTTLES DRAWN AEROBIC AND ANAEROBIC Blood Culture adequate volume Performed at [facility], [address] [place], [place] [address]    Culture  Setup Time   Final    GRAM NEGATIVE RODS IN BOTH AEROBIC AND ANAEROBIC BOTTLES CRITICAL RESULT CALLED TO, READ BACK BY AND VERIFIED WITH: BRANDON BEERS @ 0981 01/31/22 LFD CRITICAL VALUE NOTED.  VALUE IS CONSISTENT WITH PREVIOUSLY REPORTED AND CALLED VALUE.    Culture (A)  Final  KLEBSIELLA PNEUMONIAE SUSCEPTIBILITIES PERFORMED ON PREVIOUS CULTURE WITHIN THE LAST 5 DAYS. Performed at [facility], [address],  [place], [place] [address]    Report Status 02/03/2022 FINAL  Final  Culture, blood (Routine X 2) w Reflex to ID Panel     Status: Abnormal   Collection Time: 01/30/22  3:04 PM   Specimen: BLOOD  Result Value Ref Range Status   Specimen Description   Final    BLOOD LEFT ANTECUBITAL Performed at [facility], [address] [place], [place] [address]    Special Requests   Final    BOTTLES DRAWN AEROBIC AND ANAEROBIC Blood Culture adequate volume Performed at [facility], [address] [place], [place] [address]    Culture  Setup Time   Final    GRAM NEGATIVE RODS IN BOTH AEROBIC AND ANAEROBIC BOTTLES Organism ID to follow CRITICAL RESULT CALLED TO, READ BACK BY AND VERIFIED WITH: BRANDON BEERS @ 3614 01/31/22 LFD Performed at [facility], [address] [place], [place] [address]    Culture KLEBSIELLA PNEUMONIAE (A)  Final   Report Status 02/03/2022 FINAL  Final   Organism ID, Bacteria KLEBSIELLA PNEUMONIAE  Final      Susceptibility   Klebsiella pneumoniae - MIC*    AMPICILLIN >=32 RESISTANT Resistant     CEFAZOLIN <=4 SENSITIVE Sensitive     CEFEPIME <=0.12 SENSITIVE Sensitive     CEFTAZIDIME <=1 SENSITIVE Sensitive     CEFTRIAXONE <=0.25 SENSITIVE Sensitive     CIPROFLOXACIN <=0.25 SENSITIVE Sensitive     GENTAMICIN <=1 SENSITIVE Sensitive     IMIPENEM <=0.25 SENSITIVE Sensitive     TRIMETH/SULFA <=20 SENSITIVE Sensitive     AMPICILLIN/SULBACTAM 8 SENSITIVE Sensitive     PIP/TAZO <=4 SENSITIVE Sensitive     * KLEBSIELLA PNEUMONIAE  Blood Culture ID Panel (Reflexed)     Status: Abnormal   Collection Time: 01/30/22  3:04 PM  Result Value Ref Range Status   Enterococcus faecalis NOT DETECTED NOT DETECTED Final   Enterococcus Faecium NOT DETECTED NOT DETECTED Final   Listeria monocytogenes NOT DETECTED NOT DETECTED Final   Staphylococcus species NOT DETECTED NOT DETECTED Final   Staphylococcus aureus (BCID) NOT DETECTED NOT DETECTED Final    Staphylococcus epidermidis NOT DETECTED NOT DETECTED Final   Staphylococcus lugdunensis NOT DETECTED NOT DETECTED Final   Streptococcus species NOT DETECTED NOT DETECTED Final   Streptococcus agalactiae NOT DETECTED NOT DETECTED Final   Streptococcus pneumoniae NOT DETECTED NOT DETECTED Final   Streptococcus pyogenes NOT DETECTED NOT DETECTED Final   A.calcoaceticus-baumannii NOT DETECTED NOT DETECTED Final   Bacteroides fragilis NOT DETECTED NOT DETECTED Final   Enterobacterales DETECTED (A) NOT DETECTED Final    Comment: Enterobacterales represent a large order of gram negative bacteria, not a single organism. CRITICAL RESULT CALLED TO, READ BACK BY AND VERIFIED WITH: BRANDON BEERS @ 0086 01/31/22 LFD    Enterobacter cloacae complex NOT DETECTED NOT DETECTED Final   Escherichia coli NOT DETECTED NOT DETECTED Final   Klebsiella aerogenes NOT DETECTED NOT DETECTED Final   Klebsiella oxytoca NOT DETECTED NOT DETECTED Final   Klebsiella pneumoniae DETECTED (A) NOT DETECTED Final    Comment: CRITICAL RESULT CALLED TO, READ BACK BY AND VERIFIED WITH: BRANDON BEERS @ 7619 01/31/22 LFD    Proteus species NOT DETECTED NOT DETECTED Final   Salmonella species NOT DETECTED NOT DETECTED Final   Serratia marcescens NOT DETECTED NOT DETECTED Final   Haemophilus influenzae NOT DETECTED NOT  DETECTED Final   Neisseria meningitidis NOT DETECTED NOT DETECTED Final   Pseudomonas aeruginosa NOT DETECTED NOT DETECTED Final   Stenotrophomonas maltophilia NOT DETECTED NOT DETECTED Final   Candida albicans NOT DETECTED NOT DETECTED Final   Candida auris NOT DETECTED NOT DETECTED Final   Candida glabrata NOT DETECTED NOT DETECTED Final   Candida krusei NOT DETECTED NOT DETECTED Final   Candida parapsilosis NOT DETECTED NOT DETECTED Final   Candida tropicalis NOT DETECTED NOT DETECTED Final   Cryptococcus neoformans/gattii NOT DETECTED NOT DETECTED Final   CTX-M ESBL NOT DETECTED NOT DETECTED Final    Carbapenem resistance IMP NOT DETECTED NOT DETECTED Final   Carbapenem resistance KPC NOT DETECTED NOT DETECTED Final   Carbapenem resistance NDM NOT DETECTED NOT DETECTED Final   Carbapenem resist OXA 48 LIKE NOT DETECTED NOT DETECTED Final   Carbapenem resistance VIM NOT DETECTED NOT DETECTED Final    Comment: Performed at [facility], [address] [place], [place] [address]  MRSA Next Gen by PCR, Nasal     Status: None   Collection Time: 01/30/22  5:26 PM   Specimen: Nasal Mucosa; Nasal Swab  Result Value Ref Range Status   MRSA by PCR Next Gen NOT DETECTED NOT DETECTED Final    Comment: (NOTE) The GeneXpert MRSA Assay (FDA approved for NASAL specimens only), is one component of a comprehensive MRSA colonization surveillance program. It is not intended to diagnose MRSA infection nor to guide or monitor treatment for MRSA infections. Test performance is not FDA approved in patients less than 60 years old. Performed at [facility] [address], [place], [place] [address]   Urine Culture     Status: Abnormal   Collection Time: 01/30/22  8:44 PM   Specimen: Urine, Clean Catch  Result Value Ref Range Status   Specimen Description   Final    URINE, CLEAN CATCH Performed at [facility], [address], [place], [place] [address]    Special Requests   Final    NONE Performed at [facility], [place], [place] [address]    Culture 50,000 COLONIES/mL YEAST (A)  Final   Report Status 02/01/2022 FINAL  Final  Culture, blood (Routine X 2) w Reflex to ID Panel     Status: None (Preliminary result)   Collection Time: 02/02/22  4:49 AM   Specimen: BLOOD  Result Value Ref Range Status   Specimen Description BLOOD LEFT WRIST  Final   Special Requests   Final    BOTTLES DRAWN AEROBIC AND ANAEROBIC Blood Culture adequate volume   Culture   Final    NO GROWTH 4 DAYS Performed at [facility], [address]  [place], [place] [address]    Report Status PENDING  Incomplete  Culture, blood (Routine X 2) w Reflex to ID Panel     Status: None (Preliminary result)   Collection Time: 02/02/22  6:23 AM   Specimen: Left Antecubital; Blood  Result Value Ref Range Status   Specimen Description LEFT ANTECUBITAL  Final   Special Requests   Final    BOTTLES DRAWN AEROBIC AND ANAEROBIC BACTEROIDES CACCAE   Culture   Final    NO GROWTH 4 DAYS Performed at [facility], [address] [place], [place] [address]    Report Status PENDING  Incomplete    Procedures and diagnostic studies:  CT HEAD WO CONTRAST (5MM)  Result Date: 02/06/2022 CLINICAL DATA:  Stroke follow-up EXAM: CT  HEAD WITHOUT CONTRAST TECHNIQUE: Contiguous axial images were obtained from the base of the skull through the vertex without intravenous contrast. RADIATION DOSE REDUCTION: This exam was performed according to the departmental dose-optimization program which includes automated exposure control, adjustment of the mA and/or kV according to patient size and/or use of iterative reconstruction technique. COMPARISON:  02/04/22 FINDINGS: Brain: Evolving right parietal infarct with associated petechial hemorrhage. No evidence of a parenchymal hematoma. Unchanged hypodensity in the external capsule on the right. Compared to prior exam there is slight interval increase in conspicuity of the hypodensity in the posterior limb of the internal capsule on left (series 100, image 24). No hydrocephalus. No extra-axial fluid collection. Vascular: No hyperdense vessel or unexpected calcification. Skull: Normal. Negative for fracture or focal lesion. Sinuses/Orbits: Trace left mastoid effusion and mild mucosal thickening in the right sphenoid sinus. Bilateral lens replacement. Other: None. IMPRESSION: 1. Evolving right parietal infarct with associated petechial hemorrhage. No evidence of a parenchymal hematoma. 2. Possible new hypodensity in the posterior  limb of the internal capsule on the left. This is nonspecific and may be artifactual, but if there is clinical concern for new infarct, further evaluation with MRI could be considered. Electronically Signed   By: Marin Roberts M.D.   On: 02/06/2022 09:31   US Venous Img Lower Unilateral Left (DVT)  Result Date: 02/06/2022 CLINICAL DATA:  Prior study demonstrating thrombus in the right peroneal vein and left posterior tibial vein. EXAM: LEFT LOWER EXTREMITY VENOUS DOPPLER ULTRASOUND TECHNIQUE: Gray-scale sonography with graded compression, as well as color Doppler and duplex ultrasound were performed to evaluate the lower extremity deep venous systems from the level of the common femoral vein and including the common femoral, femoral, profunda femoral, popliteal and calf veins including the posterior tibial, peroneal and gastrocnemius veins when visible. The superficial great saphenous vein was also interrogated. Spectral Doppler was utilized to evaluate flow at rest and with distal augmentation maneuvers in the common femoral, femoral and popliteal veins. COMPARISON:  None Available. FINDINGS: Contralateral Common Femoral Vein: Respiratory phasicity is normal and symmetric with the symptomatic side. No evidence of thrombus. Normal compressibility. Common Femoral Vein: Nonocclusive thrombus is identified in the left common femoral vein extending up to the saphenofemoral junction but not occluding the SFJ. Saphenofemoral Junction: No evidence of thrombus. Normal compressibility and flow on color Doppler imaging. Profunda Femoral Vein: No evidence of thrombus. Normal compressibility and flow on color Doppler imaging. Femoral Vein: No evidence of thrombus. Normal compressibility, respiratory phasicity and response to augmentation. Popliteal Vein: No evidence of thrombus. Normal compressibility, respiratory phasicity and response to augmentation. Calf Veins: Peroneal vein remains normally patent. The main segment of  the posterior tibial vein is now compressible but there appears to be some thrombus in a small branch vein communicating with the posterior tibial vein. Normal compressibility and flow on color Doppler imaging. Superficial Great Saphenous Vein: No evidence of thrombus. Normal compressibility. Venous Reflux:  None. Other Findings: No evidence of superficial thrombophlebitis or abnormal fluid collection. After examination of the left lower extremity, attempt was made to study the right lower extremity but the patient refused continuation of the examination due to pain in the right lower extremity from recent fracture. Only a \ left lower extremity study was able to be accomplished. IMPRESSION: 1. Nonocclusive thrombus in the left common femoral vein extending up to the saphenofemoral junction. 2. Thrombus in a small branch of the left posterior tibial vein. 3. After examination of the right lower extremity,  attempt was made to study the right lower extremity but the patient refused continuation of the examination due to pain in the right lower extremity. Only a left lower extremity study was able to be accomplished. Electronically Signed   By: Aletta Edouard M.D.   On: 02/06/2022 08:06   US Carotid Bilateral  Result Date: 02/05/2022 CLINICAL DATA:  Cerebral infarction and hypertension. EXAM: BILATERAL CAROTID DUPLEX ULTRASOUND TECHNIQUE: Pearline Cables scale imaging, color Doppler and duplex ultrasound were performed of bilateral carotid and vertebral arteries in the neck. COMPARISON:  None Available. FINDINGS: Criteria: Quantification of carotid stenosis is based on velocity parameters that correlate the residual internal carotid diameter with NASCET-based stenosis levels, using the diameter of the distal internal carotid lumen as the denominator for stenosis measurement. The following velocity measurements were obtained: RIGHT ICA:  78/23 cm/sec CCA:  06/26 cm/sec SYSTOLIC ICA/CCA RATIO:  1.0 ECA:  73 cm/sec LEFT ICA:   87/28 cm/sec CCA:  94/85 cm/sec SYSTOLIC ICA/CCA RATIO:  1.1 ECA:  61 cm/sec RIGHT CAROTID ARTERY: Mild calcified plaque at the level of the carotid bulb and proximal right ICA. Estimated right ICA stenosis is less than 50%. RIGHT VERTEBRAL ARTERY: Antegrade flow with normal waveform and velocity. LEFT CAROTID ARTERY: Mild calcified plaque at the level of the left carotid bulb and proximal left ICA. Estimated left ICA stenosis is less than 50%. LEFT VERTEBRAL ARTERY: Antegrade flow with normal waveform and velocity. IMPRESSION: Mild calcified plaque at the level of both carotid bulbs and proximal internal carotid arteries. No significant carotid stenosis identified with estimated bilateral ICA stenoses of less than 50%. Electronically Signed   By: Aletta Edouard M.D.   On: 02/05/2022 11:32               LOS: 13 days   Juanito Gonyer  [facility]   Pager on [email or web address]. If 7PM-7AM, please contact night-coverage at [email or web address]     02/06/2022, 1:58 PM

## 2022-02-06 NOTE — Progress Notes (Signed)
The patient had evidence of propagation in her left lower extremity.  Initially there were plans for possible IVC filter placement due to this.  However, following discussion with neurology the patient is able to be fully anticoagulated.  This is preferable to IVC filter placement as it would help her long-term in the postphlebitic setting.  Based on this we will not plan an IVC filter placement at this time.

## 2022-02-06 NOTE — TOC Progression Note (Signed)
Transition of Care [facility]) - Progression Note    Patient Details  Name: Katie Bell MRN: [id number] Date of Birth: [birth year]  Transition of Care [facility]) CM/SW [place], RN Phone Number: 02/06/2022, 9:08 AM  Clinical Narrative:    Remains on IV ABX , TOC to continue to momnitor for readiness for DC to restart the Ins auth   Expected Discharge Plan: [place] Barriers to Discharge: Insurance Authorization  Expected Discharge Plan and Services Expected Discharge Plan: [place] In-house Referral: Clinical Social Work                                             Social Determinants of Health (SDOH) Interventions    Readmission Risk Interventions     No data to display

## 2022-02-06 NOTE — Care Management Important Message (Signed)
Important Message  Patient Details  Name: Katie Bell MRN: [id number] Date of Birth: May 07, 1940   Medicare Important Message Given:  Yes     [name] 02/06/2022, 11:05 AM

## 2022-02-06 NOTE — Progress Notes (Signed)
PT Cancellation Note  Patient Details Name: Katie Bell MRN: [id number] DOB: 1941-01-25   Cancelled Treatment:    Reason Eval/Treat Not Completed: Medical issues which prohibited therapy  New imaging today.  Will hold and continue as appropriate.   Chesley Noon 02/06/2022, 12:45 PM

## 2022-02-06 NOTE — Progress Notes (Signed)
Subjective:  Patient is pleasantly confused, sitting up, comfortable.   Objective:   VITALS:   Vitals:   02/05/22 2334 02/06/22 0430 02/06/22 0858 02/06/22 1648  BP: 125/65 135/78 (!) 119/106 (!) 156/79  Pulse: 95 79 99 96  Resp: '16 16 17 17  '$ Temp: 97.8 F (36.6 C) 99.9 F (37.7 C) 98.6 F (37 C) 97.6 F (36.4 C)  TempSrc:  Oral    SpO2: 98% 98% 100% 99%  Weight:      Height:        PHYSICAL EXAM:  General: Somewhat confused Right lower extremity, dressings are clean, dry, intact.  Grossly motor and sensory intact  LABS  No results found for this or any previous visit (from the past 24 hour(s)).   CT HEAD WO CONTRAST (5MM)  Result Date: 02/06/2022 CLINICAL DATA:  Stroke follow-up EXAM: CT HEAD WITHOUT CONTRAST TECHNIQUE: Contiguous axial images were obtained from the base of the skull through the vertex without intravenous contrast. RADIATION DOSE REDUCTION: This exam was performed according to the departmental dose-optimization program which includes automated exposure control, adjustment of the mA and/or kV according to patient size and/or use of iterative reconstruction technique. COMPARISON:  02/04/22 FINDINGS: Brain: Evolving right parietal infarct with associated petechial hemorrhage. No evidence of a parenchymal hematoma. Unchanged hypodensity in the external capsule on the right. Compared to prior exam there is slight interval increase in conspicuity of the hypodensity in the posterior limb of the internal capsule on left (series 100, image 24). No hydrocephalus. No extra-axial fluid collection. Vascular: No hyperdense vessel or unexpected calcification. Skull: Normal. Negative for fracture or focal lesion. Sinuses/Orbits: Trace left mastoid effusion and mild mucosal thickening in the right sphenoid sinus. Bilateral lens replacement. Other: None. IMPRESSION: 1. Evolving right parietal infarct with associated petechial hemorrhage. No evidence of a parenchymal hematoma.  2. Possible new hypodensity in the posterior limb of the internal capsule on the left. This is nonspecific and may be artifactual, but if there is clinical concern for new infarct, further evaluation with MRI could be considered. Electronically Signed   By: Marin Roberts M.D.   On: 02/06/2022 09:31   US Venous Img Lower Unilateral Left (DVT)  Result Date: 02/06/2022 CLINICAL DATA:  Prior study demonstrating thrombus in the right peroneal vein and left posterior tibial vein. EXAM: LEFT LOWER EXTREMITY VENOUS DOPPLER ULTRASOUND TECHNIQUE: Gray-scale sonography with graded compression, as well as color Doppler and duplex ultrasound were performed to evaluate the lower extremity deep venous systems from the level of the common femoral vein and including the common femoral, femoral, profunda femoral, popliteal and calf veins including the posterior tibial, peroneal and gastrocnemius veins when visible. The superficial great saphenous vein was also interrogated. Spectral Doppler was utilized to evaluate flow at rest and with distal augmentation maneuvers in the common femoral, femoral and popliteal veins. COMPARISON:  None Available. FINDINGS: Contralateral Common Femoral Vein: Respiratory phasicity is normal and symmetric with the symptomatic side. No evidence of thrombus. Normal compressibility. Common Femoral Vein: Nonocclusive thrombus is identified in the left common femoral vein extending up to the saphenofemoral junction but not occluding the SFJ. Saphenofemoral Junction: No evidence of thrombus. Normal compressibility and flow on color Doppler imaging. Profunda Femoral Vein: No evidence of thrombus. Normal compressibility and flow on color Doppler imaging. Femoral Vein: No evidence of thrombus. Normal compressibility, respiratory phasicity and response to augmentation. Popliteal Vein: No evidence of thrombus. Normal compressibility, respiratory phasicity and response to augmentation. Calf Veins: Peroneal vein  remains normally patent. The main segment of the posterior tibial vein is now compressible but there appears to be some thrombus in a small branch vein communicating with the posterior tibial vein. Normal compressibility and flow on color Doppler imaging. Superficial Great Saphenous Vein: No evidence of thrombus. Normal compressibility. Venous Reflux:  None. Other Findings: No evidence of superficial thrombophlebitis or abnormal fluid collection. After examination of the left lower extremity, attempt was made to study the right lower extremity but the patient refused continuation of the examination due to pain in the right lower extremity from recent fracture. Only a \ left lower extremity study was able to be accomplished. IMPRESSION: 1. Nonocclusive thrombus in the left common femoral vein extending up to the saphenofemoral junction. 2. Thrombus in a small branch of the left posterior tibial vein. 3. After examination of the right lower extremity, attempt was made to study the right lower extremity but the patient refused continuation of the examination due to pain in the right lower extremity. Only a left lower extremity study was able to be accomplished. Electronically Signed   By: Aletta Edouard M.D.   On: 02/06/2022 08:06   US Carotid Bilateral  Result Date: 02/05/2022 CLINICAL DATA:  Cerebral infarction and hypertension. EXAM: BILATERAL CAROTID DUPLEX ULTRASOUND TECHNIQUE: Pearline Cables scale imaging, color Doppler and duplex ultrasound were performed of bilateral carotid and vertebral arteries in the neck. COMPARISON:  None Available. FINDINGS: Criteria: Quantification of carotid stenosis is based on velocity parameters that correlate the residual internal carotid diameter with NASCET-based stenosis levels, using the diameter of the distal internal carotid lumen as the denominator for stenosis measurement. The following velocity measurements were obtained: RIGHT ICA:  78/23 cm/sec CCA:  20/25 cm/sec SYSTOLIC  ICA/CCA RATIO:  1.0 ECA:  73 cm/sec LEFT ICA:  87/28 cm/sec CCA:  42/70 cm/sec SYSTOLIC ICA/CCA RATIO:  1.1 ECA:  61 cm/sec RIGHT CAROTID ARTERY: Mild calcified plaque at the level of the carotid bulb and proximal right ICA. Estimated right ICA stenosis is less than 50%. RIGHT VERTEBRAL ARTERY: Antegrade flow with normal waveform and velocity. LEFT CAROTID ARTERY: Mild calcified plaque at the level of the left carotid bulb and proximal left ICA. Estimated left ICA stenosis is less than 50%. LEFT VERTEBRAL ARTERY: Antegrade flow with normal waveform and velocity. IMPRESSION: Mild calcified plaque at the level of both carotid bulbs and proximal internal carotid arteries. No significant carotid stenosis identified with estimated bilateral ICA stenoses of less than 50%. Electronically Signed   By: Aletta Edouard M.D.   On: 02/05/2022 11:32    Assessment/Plan: 12 Days Post-Op   Status post right hip IM nail  Principal Problem:   Closed right femoral fracture (HCC) Active Problems:   Hypertension   Severe sepsis ([place])   Leucocytosis   Fall at home, initial encounter   Bacteremia due to Klebsiella pneumoniae   CVA (cerebral vascular accident) ([place])   AKI (acute kidney injury) ([place])   Hypokalemia   Closed fracture of right hip ([place])   Protein-calorie malnutrition, severe   Acute deep vein thrombosis (DVT) of tibial vein of both lower extremities ([place])   Acute deep vein thrombosis (DVT) of left femoral vein ([place])  . PT/OT: Weight-bear as tolerated DVT prophylaxis: Per neurology Follow-up in clinic in approximately 1 week for staple removal and x-rays   Renee Harder , MD 02/06/2022, 5:38 PM

## 2022-02-06 NOTE — Progress Notes (Signed)
Neurology progress note  Subjective: Patient seen and examined.  Says that she is tired of being in bed.  She has both her hands in mitts.  Repeat CT head was done today.  Personally reviewed.  Minimal petechial hemorrhage.  Concern for left posterior limb of the internal capsule hypodensity but likely artifactual.  Objective:  Vitals:   02/06/22 0430 02/06/22 0858  BP: 135/78 (!) 119/106  Pulse: 79 99  Resp: 16 17  Temp: 99.9 F (37.7 C) 98.6 F (37 C)  SpO2: 98% 100%   Physical Exam General: Awake alert oriented to self and the fact that she is in the hospital. HEENT: Normocephalic atraumatic Lungs: Clear Cardiovascular: Regular rate rhythm Extremities: Right lower extremity with trace edema and significant amount of purpura/bruising on the back of the right thigh Neurological exam She is awake alert oriented to self and the fact that she is in the hospital Her speech is moderately dysarthric Her ability to name and repeat is impaired but she is able to follow simple commands. She has poor attention concentration Cranial nerves: Pupils equal round reactive light, extraocular movements intact, blinks to threat from both sides, face appears grossly symmetric. Motor examination with bilateral upper extremity antigravity without drift.  Left lower extremity antigravity without drift.  Very painful and weak right lower extremity due to the recent surgery. Sensation: Intact Coordination difficult to assess given her mentation.  Imaging-personally reviewed [facility] 10/15: small amt petechial hemorrhage into R parietal infarct new from CT 10/10  MRI brain 10/10: bilateral frontoparietal infarcts as well as one in the right temporal lobe, also personally reviewed. Infarcts appear embolic and acute/subacute  MRA head and neck: Limited intracranial MRA with patent ICA siphons and no evidence of a posterior circulation large-vessel occlusion but no other circle of Willis branch detail.   Limited noncontrast neck MRA demonstrating patent cervical carotid and vertebral arteries with no high-grade stenosis.  Carotid Dopplers 16 2023: Mild calcified plaque at the level of both carotid bulbs and proximal ICAs with no significant carotid stenosis and estimated bilateral ICA stenosis less than 50%.  Repeat Dopplers of the leg 02/05/2022-incomplete as patient did not permit right lower extremity testing: - Nonocclusive thrombus in the left common femoral vein extended up to the saphenofemoral junction.  Thrombus in a small branch of the left posterior tibial vein.  CT head today with evolving right parietal infarct with mild petechial hemorrhage and concern for artifact versus new hypodensity in the left posterior limb of internal capsule.  On my personal review, no concern for new hypodensity.  Assessment 81 yo woman with hx melanoma 2016, HTN, depression admitted after mechanical fall causing a hip fracture now s/p operative repair on whom neurology is consulted for multifocal subacute embolic infarcts in the setting of sepsis 2/2 Klebsiella bacteremia. She has bilat DVTs below the knee but was not started on heparin gtt immediately 2/2 acute ischemic stroke with petechial hemorrhage.  The plan was to consider starting anticoagulation 02/04/2022 after repeat head CT but the scan showed new petechial hemorrhage into the R parietal infarct. Plan to repeat head CT in 2 days which yesterday-and is stable   Recommendations:  -From a strictly stroke prevention standpoint, she only needs to be on aspirin only.  Because she had a DVT, she was started on DAPT while awaiting stability scans for petechial hemorrhage in her stroke.  Head scan looks stable.  Discussed with vascular surgery who would prefer to anticoagulate rather than put a filter given  the new lower extremity Doppler findings of DVT that is in the left common femoral extending up to saphenofemoral junction.  Okay to start Eliquis 5 mg  twice daily.  Discontinue dual antiplatelets when you start Eliquis. - Goal normotension, strict avoidance of hypotension - F/u carotid US with no significant stenosis. - TEE r/o vegetations/endocarditis - family refused. - No indication for statin patient is at goal without - STAT head CT for any change in neuro exam - Tele - PT/OT/SLP - Stroke education - Amb referral to neurology upon discharge - 8-12 weeks  Plan d.w Drs. Dew and Ayiku via secure chat. Please call inpatient neurology with questions as needed.  -- Amie Portland, MD Neurologist [facility] Pager: [phone]

## 2022-02-06 NOTE — Plan of Care (Signed)

## 2022-02-07 ENCOUNTER — Telehealth (HOSPITAL_COMMUNITY): Payer: Self-pay

## 2022-02-07 ENCOUNTER — Other Ambulatory Visit (HOSPITAL_COMMUNITY): Payer: Self-pay

## 2022-02-07 DIAGNOSIS — I639 Cerebral infarction, unspecified: Secondary | ICD-10-CM | POA: Diagnosis not present

## 2022-02-07 DIAGNOSIS — S72141A Displaced intertrochanteric fracture of right femur, initial encounter for closed fracture: Secondary | ICD-10-CM | POA: Diagnosis not present

## 2022-02-07 DIAGNOSIS — R7881 Bacteremia: Secondary | ICD-10-CM | POA: Diagnosis not present

## 2022-02-07 DIAGNOSIS — B961 Klebsiella pneumoniae [K. pneumoniae] as the cause of diseases classified elsewhere: Secondary | ICD-10-CM | POA: Diagnosis not present

## 2022-02-07 DIAGNOSIS — I82443 Acute embolism and thrombosis of tibial vein, bilateral: Secondary | ICD-10-CM | POA: Diagnosis not present

## 2022-02-07 DIAGNOSIS — N179 Acute kidney failure, unspecified: Secondary | ICD-10-CM | POA: Diagnosis not present

## 2022-02-07 LAB — BASIC METABOLIC PANEL
Anion gap: 5 (ref 5–15)
BUN: 8 mg/dL (ref 8–23)
CO2: 23 mmol/L (ref 22–32)
Calcium: 8.3 mg/dL — ABNORMAL LOW (ref 8.9–10.3)
Chloride: 111 mmol/L (ref 98–111)
Creatinine, Ser: 0.81 mg/dL (ref 0.44–1.00)
GFR, Estimated: >60 mL/min (ref >60)
Glucose, Bld: 119 mg/dL — ABNORMAL HIGH (ref 70–99)
Potassium: 3 mmol/L — ABNORMAL LOW (ref 3.5–5.1)
Sodium: 139 mmol/L (ref 135–145)

## 2022-02-07 LAB — BLOOD GAS, ARTERIAL
Acid-base deficit: 2.4 mmol/L — ABNORMAL HIGH (ref 0.0–2.0)
Bicarbonate: 16.9 mmol/L — ABNORMAL LOW (ref 20.0–28.0)
FIO2: 0.32 %
Patient temperature: 37
pCO2 arterial: 18 mmHg — CL (ref 32–48)
pH, Arterial: 7.58 — ABNORMAL HIGH (ref 7.35–7.45)
pO2, Arterial: 171 mmHg — ABNORMAL HIGH (ref 83–108)

## 2022-02-07 LAB — CBC WITH DIFFERENTIAL/PLATELET
Abs Immature Granulocytes: 0.13 10*3/uL — ABNORMAL HIGH (ref 0.00–0.07)
Basophils Absolute: 0 10*3/uL (ref 0.0–0.1)
Basophils Relative: 0 %
Eosinophils Absolute: 0.2 10*3/uL (ref 0.0–0.5)
Eosinophils Relative: 2 %
HCT: 27.7 % — ABNORMAL LOW (ref 36.0–46.0)
Hemoglobin: 9.1 g/dL — ABNORMAL LOW (ref 12.0–15.0)
Immature Granulocytes: 1 %
Lymphocytes Relative: 11 %
Lymphs Abs: 1.2 10*3/uL (ref 0.7–4.0)
MCH: 31.7 pg (ref 26.0–34.0)
MCHC: 32.9 g/dL (ref 30.0–36.0)
MCV: 96.5 fL (ref 80.0–100.0)
Monocytes Absolute: 0.8 10*3/uL (ref 0.1–1.0)
Monocytes Relative: 7 %
Neutro Abs: 9.2 10*3/uL — ABNORMAL HIGH (ref 1.7–7.7)
Neutrophils Relative %: 79 %
Platelets: 395 10*3/uL (ref 150–400)
RBC: 2.87 MIL/uL — ABNORMAL LOW (ref 3.87–5.11)
RDW: 13.2 % (ref 11.5–15.5)
WBC: 11.5 10*3/uL — ABNORMAL HIGH (ref 4.0–10.5)
nRBC: 0 % (ref 0.0–0.2)

## 2022-02-07 LAB — CULTURE, BLOOD (ROUTINE X 2)
Culture: NO GROWTH
Culture: NO GROWTH
Special Requests: ADEQUATE

## 2022-02-07 LAB — MAGNESIUM: Magnesium: 2 mg/dL (ref 1.7–2.4)

## 2022-02-07 MED ORDER — POTASSIUM CHLORIDE CRYS ER 20 MEQ PO TBCR
40.0000 meq | EXTENDED_RELEASE_TABLET | Freq: Two times a day (BID) | ORAL | Status: AC
  Administered 2022-02-07 (×2): 40 meq via ORAL
  Filled 2022-02-07 (×2): qty 2

## 2022-02-07 NOTE — Progress Notes (Addendum)
PROGRESS NOTE    Katie Bell  DSK:[id number] DOB: Aug 14, 1940 DOA: 01/24/2022 PCP: Venia Carbon, MD  Assessment & Plan:   Principal Problem:   Closed right femoral fracture [facility]) Active Problems:   Hypertension   Severe sepsis ([place])   Leucocytosis   Fall at home, initial encounter   Bacteremia due to Klebsiella pneumoniae   CVA (cerebral vascular accident) ([place])   AKI (acute kidney injury) ([place])   Hypokalemia   Closed fracture of right hip ([place])   Protein-calorie malnutrition, severe   Acute deep vein thrombosis (DVT) of tibial vein of both lower extremities (HCC)   Acute deep vein thrombosis (DVT) of left femoral vein (HCC)  Assessment and Plan: Acute embolic stroke: repeat CT head on 02/06/2022 showed evolving right parietal infarct with associated petechial hemorrhage and no evidence of parenchymal hematoma. Continue on eliquis as per neuro. Echo showed EF estimated at 55 to 60%, indeterminate LV diastolic parameters, no evidence of vegetations.  [name], son, declines TEE at this time.   Dysphagia: continue on dysphagia II diet as per speech    Acute lower extremity DVT: 1 right peroneal vein and left posterior tibial vein DVT.  Repeat venous duplex of the lower extremities on 02/05/2022 showed left common femoral vein thrombosis extending up to the saphenofemoral junction. Continue on eliquis.    Severe sepsis: secondary to Klebsiella pneumoniae bacteremia, severe leukocytosis, recent fever (Tmax 102.1 F) on 01/30/2022. Continue on IV ceftriaxone.  Follow-up with ID for further recommendations. Urine culture showed yeast.  Bacteremia: blood cx growing klebsiella. Continue on IV ceftriaxone x 5 weeks as per ID for possible endocarditis. Pt's family did not want TEE. Repeat blood cxs NGTD.    Right hip fracture: w/p right intramedullary nail intertrochanteric on 01/25/2022.  Outpatient follow-up with Dr. Sharlet Salina, orthopedic surg, for staple removal and x-rays.   Acute  blood loss anemia: H&H are stable. No need for a transfusion currently    Acute urinary retention: foley catheter was placed on 02/01/2022 after multiple in and out urethral catheterization   Hypokalemia: potassium ordered  Hypomagnesemia: WNL today   AKI: resolved  Acute hypoxic respiratory failure: resolved      DVT prophylaxis: eliquis  Code Status: full  Family Communication: discussed pt's care w/ pt's son, [name], and answered his questions  Disposition Plan: likely d/c to SNF  Level of care: Med-Surg  Status is: Inpatient Remains inpatient appropriate because: severity of illness    Consultants:  ID Vasc surg Neuro   Procedures:  Antimicrobials: rocephin    Subjective: Pt c/o malaise  Objective: Vitals:   02/06/22 0858 02/06/22 1648 02/07/22 0350 02/07/22 0757  BP: (!) 119/106 (!) 156/79 (!) 158/59 (!) 166/75  Pulse: 99 96 89 100  Resp: '17 17 16 17  '$ Temp: 98.6 F (37 C) 97.6 F (36.4 C) 98.1 F (36.7 C) 98 F (36.7 C)  TempSrc:    Oral  SpO2: 100% 99% 100% 97%  Weight:      Height:        Intake/Output Summary (Last 24 hours) at 02/07/2022 0818 Last data filed at 02/07/2022 0325 Gross per 24 hour  Intake --  Output 1050 ml  Net -1050 ml   Filed Weights   01/24/22 1614  Weight: 49.9 kg    Examination:  General exam: Appears calm but uncomfortable. Frail appearing Respiratory system: Clear to auscultation. Respiratory effort normal. Cardiovascular system: S1 & S2 +. No  rubs, gallops or clicks. Gastrointestinal system: Abdomen is  nondistended, soft and nontender. Normal bowel sounds heard. Central nervous system: Alert and awake. Moves all extremities  Psychiatry: Judgement and insight appear normal. Flat mood and affect     Data Reviewed: I have personally reviewed following labs and imaging studies  CBC: Recent Labs  Lab 02/02/22 0449 02/03/22 0701 02/04/22 0446 02/05/22 0511 02/07/22 0439  WBC 25.3* 16.3* 19.5* 14.0*  11.5*  NEUTROABS 22.2* 13.9* 16.6* 11.1* 9.2*  HGB 9.5* 9.0* 8.6* 8.4* 9.1*  HCT 27.9* 26.5* 25.7* 25.8* 27.7*  MCV 95.2 94.6 94.5 95.9 96.5  PLT 120* 152 203 262 382   Basic Metabolic Panel: Recent Labs  Lab 02/01/22 0629 02/02/22 0449 02/03/22 0701 02/04/22 0446 02/05/22 0511 02/07/22 0439  NA 146* 145 143 141 141 139  K 4.0 3.4* 3.0* 3.3* 3.9 3.0*  CL 119* 117* 114* 115* 115* 111  CO2 17* 20* '23 23 22 23  '$ GLUCOSE 147* 107* 139* 101* 111* 119*  BUN 46* 25* '16 12 10 8  '$ CREATININE 1.23* 0.86 0.89 0.74 0.73 0.81  CALCIUM 8.4* 8.4* 7.9* 7.7* 8.0* 8.3*  MG 2.1 1.8 1.6* 2.2  --  2.0  PHOS 2.7  --  3.4 2.9  --   --    GFR: Estimated Creatinine Clearance: 38.5 mL/min (by C-G formula based on SCr of 0.81 mg/dL). Liver Function Tests: Recent Labs  Lab 02/01/22 0629 02/04/22 0446  AST 25  --   ALT 12  --   ALKPHOS 84  --   BILITOT 1.2  --   PROT 6.0*  --   ALBUMIN 2.7* 2.3*   No results for input(s): "LIPASE", "AMYLASE" in the last 168 hours. No results for input(s): "AMMONIA" in the last 168 hours. Coagulation Profile: No results for input(s): "INR", "PROTIME" in the last 168 hours. Cardiac Enzymes: No results for input(s): "CKTOTAL", "CKMB", "CKMBINDEX", "TROPONINI" in the last 168 hours. BNP (last 3 results) No results for input(s): "PROBNP" in the last 8760 hours. HbA1C: No results for input(s): "HGBA1C" in the last 72 hours. CBG: No results for input(s): "GLUCAP" in the last 168 hours. Lipid Profile: No results for input(s): "CHOL", "HDL", "LDLCALC", "TRIG", "CHOLHDL", "LDLDIRECT" in the last 72 hours. Thyroid Function Tests: No results for input(s): "TSH", "T4TOTAL", "FREET4", "T3FREE", "THYROIDAB" in the last 72 hours. Anemia Panel: No results for input(s): "VITAMINB12", "FOLATE", "FERRITIN", "TIBC", "IRON", "RETICCTPCT" in the last 72 hours. Sepsis Labs: No results for input(s): "PROCALCITON", "LATICACIDVEN" in the last 168 hours.  Recent Results (from the  past 240 hour(s))  Culture, blood (Routine X 2) w Reflex to ID Panel     Status: Abnormal   Collection Time: 01/30/22  3:04 PM   Specimen: BLOOD  Result Value Ref Range Status   Specimen Description   Final    BLOOD RIGHT ANTECUBITAL Performed at [facility], [address], [place], [place] [address]    Special Requests   Final    BOTTLES DRAWN AEROBIC AND ANAEROBIC Blood Culture adequate volume Performed at [facility], [address] [place], [place] [address]    Culture  Setup Time   Final    GRAM NEGATIVE RODS IN BOTH AEROBIC AND ANAEROBIC BOTTLES CRITICAL RESULT CALLED TO, READ BACK BY AND VERIFIED WITH: BRANDON BEERS @ 1937 01/31/22 LFD CRITICAL VALUE NOTED.  VALUE IS CONSISTENT WITH PREVIOUSLY REPORTED AND CALLED VALUE.    Culture (A)  Final    KLEBSIELLA PNEUMONIAE SUSCEPTIBILITIES PERFORMED ON PREVIOUS CULTURE WITHIN THE LAST 5 DAYS. Performed at  [facility], [place] [address], [place], [place] [address]    Report Status 02/03/2022 FINAL  Final  Culture, blood (Routine X 2) w Reflex to ID Panel     Status: Abnormal   Collection Time: 01/30/22  3:04 PM   Specimen: BLOOD  Result Value Ref Range Status   Specimen Description   Final    BLOOD LEFT ANTECUBITAL Performed at [facility], [address] [place], [place] [address]    Special Requests   Final    BOTTLES DRAWN AEROBIC AND ANAEROBIC Blood Culture adequate volume Performed at [facility], [address] [place], [place] [address]    Culture  Setup Time   Final    GRAM NEGATIVE RODS IN BOTH AEROBIC AND ANAEROBIC BOTTLES Organism ID to follow CRITICAL RESULT CALLED TO, READ BACK BY AND VERIFIED WITH: BRANDON BEERS @ 4235 01/31/22 LFD Performed at [facility], [address] [place],  [address]    Culture KLEBSIELLA PNEUMONIAE (A)  Final   Report Status 02/03/2022 FINAL  Final   Organism ID, Bacteria KLEBSIELLA PNEUMONIAE  Final       Susceptibility   Klebsiella pneumoniae - MIC*    AMPICILLIN >=32 RESISTANT Resistant     CEFAZOLIN <=4 SENSITIVE Sensitive     CEFEPIME <=0.12 SENSITIVE Sensitive     CEFTAZIDIME <=1 SENSITIVE Sensitive     CEFTRIAXONE <=0.25 SENSITIVE Sensitive     CIPROFLOXACIN <=0.25 SENSITIVE Sensitive     GENTAMICIN <=1 SENSITIVE Sensitive     IMIPENEM <=0.25 SENSITIVE Sensitive     TRIMETH/SULFA <=20 SENSITIVE Sensitive     AMPICILLIN/SULBACTAM 8 SENSITIVE Sensitive     PIP/TAZO <=4 SENSITIVE Sensitive     * KLEBSIELLA PNEUMONIAE  Blood Culture ID Panel (Reflexed)     Status: Abnormal   Collection Time: 01/30/22  3:04 PM  Result Value Ref Range Status   Enterococcus faecalis NOT DETECTED NOT DETECTED Final   Enterococcus Faecium NOT DETECTED NOT DETECTED Final   Listeria monocytogenes NOT DETECTED NOT DETECTED Final   Staphylococcus species NOT DETECTED NOT DETECTED Final   Staphylococcus aureus (BCID) NOT DETECTED NOT DETECTED Final   Staphylococcus epidermidis NOT DETECTED NOT DETECTED Final   Staphylococcus lugdunensis NOT DETECTED NOT DETECTED Final   Streptococcus species NOT DETECTED NOT DETECTED Final   Streptococcus agalactiae NOT DETECTED NOT DETECTED Final   Streptococcus pneumoniae NOT DETECTED NOT DETECTED Final   Streptococcus pyogenes NOT DETECTED NOT DETECTED Final   A.calcoaceticus-baumannii NOT DETECTED NOT DETECTED Final   Bacteroides fragilis NOT DETECTED NOT DETECTED Final   Enterobacterales DETECTED (A) NOT DETECTED Final    Comment: Enterobacterales represent a large order of gram negative bacteria, not a single organism. CRITICAL RESULT CALLED TO, READ BACK BY AND VERIFIED WITH: BRANDON BEERS @ 3154 01/31/22 LFD    Enterobacter cloacae complex NOT DETECTED NOT DETECTED Final   Escherichia coli NOT DETECTED NOT DETECTED Final   Klebsiella aerogenes NOT DETECTED NOT DETECTED Final   Klebsiella oxytoca NOT DETECTED NOT DETECTED Final   Klebsiella pneumoniae DETECTED  (A) NOT DETECTED Final    Comment: CRITICAL RESULT CALLED TO, READ BACK BY AND VERIFIED WITH: BRANDON BEERS @ 0086 01/31/22 LFD    Proteus species NOT DETECTED NOT DETECTED Final   Salmonella species NOT DETECTED NOT DETECTED Final   Serratia marcescens NOT DETECTED NOT DETECTED Final   Haemophilus influenzae NOT DETECTED NOT DETECTED Final   Neisseria meningitidis NOT DETECTED NOT DETECTED Final   Pseudomonas  aeruginosa NOT DETECTED NOT DETECTED Final   Stenotrophomonas maltophilia NOT DETECTED NOT DETECTED Final   Candida albicans NOT DETECTED NOT DETECTED Final   Candida auris NOT DETECTED NOT DETECTED Final   Candida glabrata NOT DETECTED NOT DETECTED Final   Candida krusei NOT DETECTED NOT DETECTED Final   Candida parapsilosis NOT DETECTED NOT DETECTED Final   Candida tropicalis NOT DETECTED NOT DETECTED Final   Cryptococcus neoformans/gattii NOT DETECTED NOT DETECTED Final   CTX-M ESBL NOT DETECTED NOT DETECTED Final   Carbapenem resistance IMP NOT DETECTED NOT DETECTED Final   Carbapenem resistance KPC NOT DETECTED NOT DETECTED Final   Carbapenem resistance NDM NOT DETECTED NOT DETECTED Final   Carbapenem resist OXA 48 LIKE NOT DETECTED NOT DETECTED Final   Carbapenem resistance VIM NOT DETECTED NOT DETECTED Final    Comment: Performed at [facility], [address] [place], [place] [address]  MRSA Next Gen by PCR, Nasal     Status: None   Collection Time: 01/30/22  5:26 PM   Specimen: Nasal Mucosa; Nasal Swab  Result Value Ref Range Status   MRSA by PCR Next Gen NOT DETECTED NOT DETECTED Final    Comment: (NOTE) The GeneXpert MRSA Assay (FDA approved for NASAL specimens only), is one component of a comprehensive MRSA colonization surveillance program. It is not intended to diagnose MRSA infection nor to guide or monitor treatment for MRSA infections. Test performance is not FDA approved in patients less than 57 years old. Performed at [facility],  [address], [place], [place] [address]   Urine Culture     Status: Abnormal   Collection Time: 01/30/22  8:44 PM   Specimen: Urine, Clean Catch  Result Value Ref Range Status   Specimen Description   Final    URINE, CLEAN CATCH Performed at [facility], [address], [place], [place] [address]    Special Requests   Final    NONE Performed at [facility], [place], [place] [address]    Culture 50,000 COLONIES/mL YEAST (A)  Final   Report Status 02/01/2022 FINAL  Final  Culture, blood (Routine X 2) w Reflex to ID Panel     Status: None (Preliminary result)   Collection Time: 02/02/22  4:49 AM   Specimen: BLOOD  Result Value Ref Range Status   Specimen Description BLOOD LEFT WRIST  Final   Special Requests   Final    BOTTLES DRAWN AEROBIC AND ANAEROBIC Blood Culture adequate volume   Culture   Final    NO GROWTH 4 DAYS Performed at [facility], [address] [place], [place] [address]    Report Status PENDING  Incomplete  Culture, blood (Routine X 2) w Reflex to ID Panel     Status: None (Preliminary result)   Collection Time: 02/02/22  6:23 AM   Specimen: Left Antecubital; Blood  Result Value Ref Range Status   Specimen Description LEFT ANTECUBITAL  Final   Special Requests   Final    BOTTLES DRAWN AEROBIC AND ANAEROBIC BACTEROIDES CACCAE   Culture   Final    NO GROWTH 4 DAYS Performed at [facility], [address], [place],  [address]    Report Status PENDING  Incomplete         Radiology Studies: CT HEAD WO CONTRAST (5MM)  Result Date: 02/06/2022 CLINICAL DATA:  Stroke follow-up EXAM: CT HEAD WITHOUT CONTRAST TECHNIQUE: Contiguous axial images were obtained from the base of  the skull through the vertex without intravenous contrast. RADIATION DOSE REDUCTION: This exam was performed according to the departmental dose-optimization program which includes automated exposure control, adjustment of  the mA and/or kV according to patient size and/or use of iterative reconstruction technique. COMPARISON:  02/04/22 FINDINGS: Brain: Evolving right parietal infarct with associated petechial hemorrhage. No evidence of a parenchymal hematoma. Unchanged hypodensity in the external capsule on the right. Compared to prior exam there is slight interval increase in conspicuity of the hypodensity in the posterior limb of the internal capsule on left (series 100, image 24). No hydrocephalus. No extra-axial fluid collection. Vascular: No hyperdense vessel or unexpected calcification. Skull: Normal. Negative for fracture or focal lesion. Sinuses/Orbits: Trace left mastoid effusion and mild mucosal thickening in the right sphenoid sinus. Bilateral lens replacement. Other: None. IMPRESSION: 1. Evolving right parietal infarct with associated petechial hemorrhage. No evidence of a parenchymal hematoma. 2. Possible new hypodensity in the posterior limb of the internal capsule on the left. This is nonspecific and may be artifactual, but if there is clinical concern for new infarct, further evaluation with MRI could be considered. Electronically Signed   By: Marin Roberts M.D.   On: 02/06/2022 09:31   US Venous Img Lower Unilateral Left (DVT)  Result Date: 02/06/2022 CLINICAL DATA:  Prior study demonstrating thrombus in the right peroneal vein and left posterior tibial vein. EXAM: LEFT LOWER EXTREMITY VENOUS DOPPLER ULTRASOUND TECHNIQUE: Gray-scale sonography with graded compression, as well as color Doppler and duplex ultrasound were performed to evaluate the lower extremity deep venous systems from the level of the common femoral vein and including the common femoral, femoral, profunda femoral, popliteal and calf veins including the posterior tibial, peroneal and gastrocnemius veins when visible. The superficial great saphenous vein was also interrogated. Spectral Doppler was utilized to evaluate flow at rest and with distal  augmentation maneuvers in the common femoral, femoral and popliteal veins. COMPARISON:  None Available. FINDINGS: Contralateral Common Femoral Vein: Respiratory phasicity is normal and symmetric with the symptomatic side. No evidence of thrombus. Normal compressibility. Common Femoral Vein: Nonocclusive thrombus is identified in the left common femoral vein extending up to the saphenofemoral junction but not occluding the SFJ. Saphenofemoral Junction: No evidence of thrombus. Normal compressibility and flow on color Doppler imaging. Profunda Femoral Vein: No evidence of thrombus. Normal compressibility and flow on color Doppler imaging. Femoral Vein: No evidence of thrombus. Normal compressibility, respiratory phasicity and response to augmentation. Popliteal Vein: No evidence of thrombus. Normal compressibility, respiratory phasicity and response to augmentation. Calf Veins: Peroneal vein remains normally patent. The main segment of the posterior tibial vein is now compressible but there appears to be some thrombus in a small branch vein communicating with the posterior tibial vein. Normal compressibility and flow on color Doppler imaging. Superficial Great Saphenous Vein: No evidence of thrombus. Normal compressibility. Venous Reflux:  None. Other Findings: No evidence of superficial thrombophlebitis or abnormal fluid collection. After examination of the left lower extremity, attempt was made to study the right lower extremity but the patient refused continuation of the examination due to pain in the right lower extremity from recent fracture. Only a \ left lower extremity study was able to be accomplished. IMPRESSION: 1. Nonocclusive thrombus in the left common femoral vein extending up to the saphenofemoral junction. 2. Thrombus in a small branch of the left posterior tibial vein. 3. After examination of the right lower extremity, attempt was made to study the right lower extremity but the patient refused  continuation of the examination due to pain in the right lower extremity. Only a left lower extremity study was able to be accomplished. Electronically Signed   By: Aletta Edouard M.D.   On: 02/06/2022 08:06   US Carotid Bilateral  Result Date: 02/05/2022 CLINICAL DATA:  Cerebral infarction and hypertension. EXAM: BILATERAL CAROTID DUPLEX ULTRASOUND TECHNIQUE: Pearline Cables scale imaging, color Doppler and duplex ultrasound were performed of bilateral carotid and vertebral arteries in the neck. COMPARISON:  None Available. FINDINGS: Criteria: Quantification of carotid stenosis is based on velocity parameters that correlate the residual internal carotid diameter with NASCET-based stenosis levels, using the diameter of the distal internal carotid lumen as the denominator for stenosis measurement. The following velocity measurements were obtained: RIGHT ICA:  78/23 cm/sec CCA:  68/61 cm/sec SYSTOLIC ICA/CCA RATIO:  1.0 ECA:  73 cm/sec LEFT ICA:  87/28 cm/sec CCA:  68/37 cm/sec SYSTOLIC ICA/CCA RATIO:  1.1 ECA:  61 cm/sec RIGHT CAROTID ARTERY: Mild calcified plaque at the level of the carotid bulb and proximal right ICA. Estimated right ICA stenosis is less than 50%. RIGHT VERTEBRAL ARTERY: Antegrade flow with normal waveform and velocity. LEFT CAROTID ARTERY: Mild calcified plaque at the level of the left carotid bulb and proximal left ICA. Estimated left ICA stenosis is less than 50%. LEFT VERTEBRAL ARTERY: Antegrade flow with normal waveform and velocity. IMPRESSION: Mild calcified plaque at the level of both carotid bulbs and proximal internal carotid arteries. No significant carotid stenosis identified with estimated bilateral ICA stenoses of less than 50%. Electronically Signed   By: Aletta Edouard M.D.   On: 02/05/2022 11:32        Scheduled Meds:  apixaban  5 mg Oral BID   Chlorhexidine Gluconate Cloth  6 each Topical Daily   feeding supplement (NEPRO CARB STEADY)  237 mL Oral BID BM   multivitamin with  minerals  1 tablet Oral Daily   pantoprazole  40 mg Oral Daily   Continuous Infusions:  sodium chloride 20 mL/hr at 02/03/22 1619   cefTRIAXone (ROCEPHIN)  IV 2 g (02/05/22 1512)     LOS: 14 days    Time spent: 25 mins     Wyvonnia Dusky, MD [facility] Pager [phone]  If 7PM-7AM, please contact night-coverage [email or web address] 02/07/2022, 8:18 AM

## 2022-02-07 NOTE — Progress Notes (Signed)
Nutrition Follow-up  DOCUMENTATION CODES:   Severe malnutrition in context of social or environmental circumstances  INTERVENTION:   -Continue MVI with minerals daily -Continue Magic cup TID with meals, each supplement provides 290 kcal and 9 grams of protein  -Continue Nepro Shake po TID, each supplement provides 425 kcal and 19 grams protein  -Feeding assistance with meals  NUTRITION DIAGNOSIS:   Severe Malnutrition related to social / environmental circumstances as evidenced by severe fat depletion, severe muscle depletion.  Ongoing  GOAL:   Patient will meet greater than or equal to 90% of their needs  Progressing   MONITOR:   PO intake, Supplement acceptance, Diet advancement  REASON FOR ASSESSMENT:   Consult Assessment of nutrition requirement/status  ASSESSMENT:   81 y.o. female presented to the ED after a fall at home. PMH includes malnutrition, GERD, and HTN. Pt admitted with R femoral fracture.  10/12- s/p BSE- advanced to dysphagia 1 diet with nectar thick liquids 10/13- s/p BSE- advanced to dysphagia 2 diet with nectar thick liquids  Reviewed I/O's: -1.1 L x 24 hours and -4.3 L since admission  UOP: 1.1 L x 24 hours  Pt unavailable at time of visit.   Noted meal completion 0-25%. Pt is drinking Nepro supplements.   Per MD notes, plan to d/c to SNF once medically stable.  Medications reviewed and include potassium chloride.   Labs reviewed: K: 3.0, CBGS: 144 (inpatient orders for glycemic control are none).    Diet Order:   Diet Order             DIET DYS 2 Room service appropriate? Yes with Assist; Fluid consistency: Nectar Thick  Diet effective now                   EDUCATION NEEDS:   No education needs have been identified at this time  Skin:  Skin Assessment: Skin Integrity Issues: Skin Integrity Issues:: Incisions Incisions: closed rt hip, rt thigh  Last BM:  02/07/22 (type 6)  Height:   Ht Readings from Last 1  Encounters:  01/24/22 5' (1.524 m)    Weight:   Wt Readings from Last 1 Encounters:  01/24/22 49.9 kg    Ideal Body Weight:  45.5 kg  BMI:  Body mass index is 21.48 kg/m.  Estimated Nutritional Needs:   Kcal:  1550-1750  Protein:  85-100 grams  Fluid:  > 1.5 L    Loistine Chance, RD, LDN, [place] Registered Dietitian II Certified Diabetes Care and Education Specialist Please refer to [facility] for RD and/or RD on-call/weekend/after hours pager

## 2022-02-07 NOTE — Telephone Encounter (Signed)
Pharmacy Patient Advocate Encounter  Insurance verification completed.    The patient is insured through [facility]   The patient is currently admitted and ran test claims for the following: Eliquis '5mg'$ .  Copays and coinsurance results were relayed to Inpatient clinical team.

## 2022-02-07 NOTE — TOC Benefit Eligibility Note (Signed)
Patient Teacher, English as a foreign language completed.    The patient is currently admitted and upon discharge could be taking Eliquis '5mg'$ .  The current 30 day co-pay is $45.00.  The patient is insured through [place] Patient Advocate Specialist [place] Patient Advocate Team Direct Number: [phone] Fax: [phone]

## 2022-02-07 NOTE — Progress Notes (Signed)
Date of Admission:  01/24/2022     ID: Katie Bell is a 81 y.o. female  Principal Problem:   Closed right femoral fracture ([place]) Active Problems:   Hypertension   Severe sepsis ([place])   Leucocytosis   Fall at home, initial encounter   Bacteremia due to Klebsiella pneumoniae   CVA (cerebral vascular accident) ([place])   AKI (acute kidney injury) ([place])   Hypokalemia   Closed fracture of right hip ([place])   Protein-calorie malnutrition, severe   Acute deep vein thrombosis (DVT) of tibial vein of both lower extremities (HCC)   Acute deep vein thrombosis (DVT) of left femoral vein (HCC)    Subjective: More alert But goes off to sleep easily  Medications:   apixaban  5 mg Oral BID   Chlorhexidine Gluconate Cloth  6 each Topical Daily   feeding supplement (NEPRO CARB STEADY)  237 mL Oral BID BM   multivitamin with minerals  1 tablet Oral Daily   pantoprazole  40 mg Oral Daily   potassium chloride  40 mEq Oral BID    Objective: Vital signs in last 24 hours: Patient Vitals for the past 24 hrs:  BP Temp Temp src Pulse Resp SpO2  02/07/22 1519 136/76 98.1 F (36.7 C) Oral (!) 110 17 97 %  02/07/22 0757 (!) 166/75 98 F (36.7 C) Oral 100 17 97 %  02/07/22 0350 (!) 158/59 98.1 F (36.7 C) -- 89 16 100 %     PHYSICAL EXAM:  General: awake, less confused, speech is better  Lungs: b/l air entry Heart: s1s2 Abdomen: Soft, non-tender,not distended. Bowel sounds normal. No masses Neurologic: moves both extremities Rt thigh ecchymosis much better  Lab Results Recent Labs    02/05/22 0511 02/07/22 0439  WBC 14.0* 11.5*  HGB 8.4* 9.1*  HCT 25.8* 27.7*  NA 141 139  K 3.9 3.0*  CL 115* 111  CO2 22 23  BUN 10 8  CREATININE 0.73 0.81   Liver Panel No results for input(s): "PROT", "ALBUMIN", "AST", "ALT", "ALKPHOS", "BILITOT", "BILIDIR", "IBILI" in the last 72 hours. Microbiology: BC- klebsiella  Studies/Results: CT HEAD WO CONTRAST (5MM)  Result Date:  02/06/2022 CLINICAL DATA:  Stroke follow-up EXAM: CT HEAD WITHOUT CONTRAST TECHNIQUE: Contiguous axial images were obtained from the base of the skull through the vertex without intravenous contrast. RADIATION DOSE REDUCTION: This exam was performed according to the departmental dose-optimization program which includes automated exposure control, adjustment of the mA and/or kV according to patient size and/or use of iterative reconstruction technique. COMPARISON:  02/04/22 FINDINGS: Brain: Evolving right parietal infarct with associated petechial hemorrhage. No evidence of a parenchymal hematoma. Unchanged hypodensity in the external capsule on the right. Compared to prior exam there is slight interval increase in conspicuity of the hypodensity in the posterior limb of the internal capsule on left (series 100, image 24). No hydrocephalus. No extra-axial fluid collection. Vascular: No hyperdense vessel or unexpected calcification. Skull: Normal. Negative for fracture or focal lesion. Sinuses/Orbits: Trace left mastoid effusion and mild mucosal thickening in the right sphenoid sinus. Bilateral lens replacement. Other: None. IMPRESSION: 1. Evolving right parietal infarct with associated petechial hemorrhage. No evidence of a parenchymal hematoma. 2. Possible new hypodensity in the posterior limb of the internal capsule on the left. This is nonspecific and may be artifactual, but if there is clinical concern for new infarct, further evaluation with MRI could be considered. Electronically Signed   By: Marin Roberts M.D.   On: 02/06/2022  09:31   US Venous Img Lower Unilateral Left (DVT)  Result Date: 02/06/2022 CLINICAL DATA:  Prior study demonstrating thrombus in the right peroneal vein and left posterior tibial vein. EXAM: LEFT LOWER EXTREMITY VENOUS DOPPLER ULTRASOUND TECHNIQUE: Gray-scale sonography with graded compression, as well as color Doppler and duplex ultrasound were performed to evaluate the lower  extremity deep venous systems from the level of the common femoral vein and including the common femoral, femoral, profunda femoral, popliteal and calf veins including the posterior tibial, peroneal and gastrocnemius veins when visible. The superficial great saphenous vein was also interrogated. Spectral Doppler was utilized to evaluate flow at rest and with distal augmentation maneuvers in the common femoral, femoral and popliteal veins. COMPARISON:  None Available. FINDINGS: Contralateral Common Femoral Vein: Respiratory phasicity is normal and symmetric with the symptomatic side. No evidence of thrombus. Normal compressibility. Common Femoral Vein: Nonocclusive thrombus is identified in the left common femoral vein extending up to the saphenofemoral junction but not occluding the SFJ. Saphenofemoral Junction: No evidence of thrombus. Normal compressibility and flow on color Doppler imaging. Profunda Femoral Vein: No evidence of thrombus. Normal compressibility and flow on color Doppler imaging. Femoral Vein: No evidence of thrombus. Normal compressibility, respiratory phasicity and response to augmentation. Popliteal Vein: No evidence of thrombus. Normal compressibility, respiratory phasicity and response to augmentation. Calf Veins: Peroneal vein remains normally patent. The main segment of the posterior tibial vein is now compressible but there appears to be some thrombus in a small branch vein communicating with the posterior tibial vein. Normal compressibility and flow on color Doppler imaging. Superficial Great Saphenous Vein: No evidence of thrombus. Normal compressibility. Venous Reflux:  None. Other Findings: No evidence of superficial thrombophlebitis or abnormal fluid collection. After examination of the left lower extremity, attempt was made to study the right lower extremity but the patient refused continuation of the examination due to pain in the right lower extremity from recent fracture. Only a \  left lower extremity study was able to be accomplished. IMPRESSION: 1. Nonocclusive thrombus in the left common femoral vein extending up to the saphenofemoral junction. 2. Thrombus in a small branch of the left posterior tibial vein. 3. After examination of the right lower extremity, attempt was made to study the right lower extremity but the patient refused continuation of the examination due to pain in the right lower extremity. Only a left lower extremity study was able to be accomplished. Electronically Signed   By: Aletta Edouard M.D.   On: 02/06/2022 08:06     Assessment/Plan: ?Klebsiella bacteremia -likely Hospital acquired- no blood culture was done on admission Source- unclear- UTI ruled out - foley was there for a few hours for surgery and was removed-  was replaced due to urinary retention Surgical site- other than ecchymosis no signs of infection Continue Ceftriaxone- as concern for embolic stroke and possible vegetation will  have to treat like endocarditis for a total of 6 weeks- will discuss with er son Family had not wanted TEE Repeat BC NG     CVA-  TEE to r/o embolic source from valve /endocarditis was recommended by neuro Family does not want it now  Fall- with fracture rt femur - s/p intertrochanteric nailing     Severe leucocytosis- leukemoid reaction, resolved  ? Due to ecchymosis, b/l popliteal vein DVT or other cause   Anemia  Thrombocytopenia resolved  Discussed the management with the care team

## 2022-02-07 NOTE — Progress Notes (Signed)
Physical Therapy Treatment Patient Details Name: Katie Bell MRN: [id number] DOB: 24-Jul-1940 Today's Date: 02/07/2022   History of Present Illness Katie Bell is an 60yoF who comes to [facility] on 10/[facility] after a fall, scans revealing of right intertrochanteric femur fracture. PMH: HTN, GAD, basal cell carcinoma, GERD, renal lithiasis. Pt taken to theatre under direction of Renee Harder for IM nail fixation, then made WBAT. At baseline pt lives with son, most household AMB without device, still drives occasionally, no recent balance or falls issues. Rapid response was called on 10/10, pt found to have acute/subacute infarcts. Additional workup revealing for urinary retention and bacteremia. Pt found to have acute bilat DVT, anticoagulation delayed inititally due to concerns of cerebral hemorrhage, place on DAPT until 10/17 when she was started on eliquis.    PT Comments    Pt asleep on entry, mittens donned. Pt awakens easily, is interactive, but maintains eyes closed much of first 75% of session. Pt following commands better for basic AA/ROM exercises of RLE without capacity to achieve much nuance with cues. Pt assisted maxA to EOB where her falls anxiety briefly elevates, but then gains confidence over time. Her seated trunk control is adequate. Pt assisted with 4-5 attempts of STS hug transfers with simple sequential cues, anywhere from minA to maxA depending on her anxiety level. Pt assisted to up for ~60sec of standing so NA could assist with linen change and pericare at bedside. This was poorly tolerated with fast fatigue progressing into confidence crumbling and panic propagation. Pt assisted back to bed at EOB, HOB >40 degrees, pillow under Rt knee to facilitate DC of default FADIR posturing.     Recommendations for follow up therapy are one component of a multi-disciplinary discharge planning process, led by the attending physician.  Recommendations may be updated based on patient status,  additional functional criteria and insurance authorization.  Follow Up Recommendations  Skilled nursing-short term rehab (<3 hours/day) Can patient physically be transported by private vehicle: No   Assistance Recommended at Discharge Frequent or constant Supervision/Assistance  Patient can return home with the following Two people to help with walking and/or transfers;Two people to help with bathing/dressing/bathroom;Direct supervision/assist for medications management;Direct supervision/assist for financial management;Assist for transportation;Assistance with cooking/housework;Assistance with feeding;Help with stairs or ramp for entrance   Equipment Recommendations       Recommendations for Other Services       Precautions / Restrictions Precautions Precautions: Fall Restrictions Weight Bearing Restrictions: No RLE Weight Bearing: Weight bearing as tolerated     Mobility  Bed Mobility Overal bed mobility: Needs Assistance       Supine to sit: Max assist Sit to supine: Max assist   General bed mobility comments: follows cues partially, but easily distracted, frequently tries to derail actiivty    Transfers Overall transfer level: Needs assistance Equipment used: None Transfers: Sit to/from Stand Sit to Stand: Mod assist           General transfer comment: hug dependent transfer, cues for hand and foot placement, min-modA when not anxious but performance varies. Struggles and panics to remain up for pericare to be performed by NA.    Ambulation/Gait                   Stairs             Wheelchair Mobility    Modified Rankin (Stroke Patients Only)       Balance  Cognition     Overall Cognitive Status: Impaired/Different from baseline                                          Exercises  Seated EOB pillow tossing/catching 1x10, no LOB, but has 2-3 false  starts.     General Comments        Pertinent Vitals/Pain Pain Assessment Pain Assessment:  (pt unable to rate due to mentation)    Home Living                          Prior Function            PT Goals (current goals can now be found in the care plan section) Acute Rehab PT Goals Patient Stated Goal: none stated PT Goal Formulation: With family Time For Goal Achievement: 02/14/22 Potential to Achieve Goals: Fair Progress towards PT goals: Progressing toward goals    Frequency    7X/week      PT Plan Current plan remains appropriate    Co-evaluation              AM-PAC PT "6 Clicks" Mobility   Outcome Measure  Help needed turning from your back to your side while in a flat bed without using bedrails?: Total Help needed moving from lying on your back to sitting on the side of a flat bed without using bedrails?: Total Help needed moving to and from a bed to a chair (including a wheelchair)?: A Lot Help needed standing up from a chair using your arms (e.g., wheelchair or bedside chair)?: A Lot Help needed to walk in hospital room?: Total Help needed climbing 3-5 steps with a railing? : Total 6 Click Score: 8    End of Session   Activity Tolerance: Patient tolerated treatment well (falls anxiety at times) Patient left: in bed;with bed alarm set;with call bell/phone within reach Nurse Communication: Mobility status PT Visit Diagnosis: Muscle weakness (generalized) (M62.81);History of falling (Z91.81)     Time: 9211-9417 PT Time Calculation (min) (ACUTE ONLY): 30 min  Charges:  $Therapeutic Activity: 23-37 mins                    4:23 PM, 02/07/22 Katie Bell, PT, DPT Physical Therapist - [facility]  [phone] ([place])    Katie Bell C 02/07/2022, 4:18 PM

## 2022-02-07 NOTE — Plan of Care (Signed)

## 2022-02-08 ENCOUNTER — Inpatient Hospital Stay: Payer: Self-pay

## 2022-02-08 DIAGNOSIS — I82443 Acute embolism and thrombosis of tibial vein, bilateral: Secondary | ICD-10-CM | POA: Diagnosis not present

## 2022-02-08 DIAGNOSIS — S72141A Displaced intertrochanteric fracture of right femur, initial encounter for closed fracture: Secondary | ICD-10-CM | POA: Diagnosis not present

## 2022-02-08 DIAGNOSIS — R7881 Bacteremia: Secondary | ICD-10-CM | POA: Diagnosis not present

## 2022-02-08 DIAGNOSIS — B961 Klebsiella pneumoniae [K. pneumoniae] as the cause of diseases classified elsewhere: Secondary | ICD-10-CM | POA: Diagnosis not present

## 2022-02-08 LAB — CBC
HCT: 30.3 % — ABNORMAL LOW (ref 36.0–46.0)
Hemoglobin: 9.9 g/dL — ABNORMAL LOW (ref 12.0–15.0)
MCH: 32.4 pg (ref 26.0–34.0)
MCHC: 32.7 g/dL (ref 30.0–36.0)
MCV: 99 fL (ref 80.0–100.0)
Platelets: 374 10*3/uL (ref 150–400)
RBC: 3.06 MIL/uL — ABNORMAL LOW (ref 3.87–5.11)
RDW: 13.6 % (ref 11.5–15.5)
WBC: 11 10*3/uL — ABNORMAL HIGH (ref 4.0–10.5)
nRBC: 0 % (ref 0.0–0.2)

## 2022-02-08 LAB — BASIC METABOLIC PANEL
Anion gap: 3 — ABNORMAL LOW (ref 5–15)
BUN: 10 mg/dL (ref 8–23)
CO2: 21 mmol/L — ABNORMAL LOW (ref 22–32)
Calcium: 8.2 mg/dL — ABNORMAL LOW (ref 8.9–10.3)
Chloride: 116 mmol/L — ABNORMAL HIGH (ref 98–111)
Creatinine, Ser: 0.89 mg/dL (ref 0.44–1.00)
GFR, Estimated: >60 mL/min (ref >60)
Glucose, Bld: 104 mg/dL — ABNORMAL HIGH (ref 70–99)
Potassium: 3.9 mmol/L (ref 3.5–5.1)
Sodium: 140 mmol/L (ref 135–145)

## 2022-02-08 NOTE — Progress Notes (Addendum)
Occupational Therapy Treatment Patient Details Name: Katie Bell MRN: [id number] DOB: Oct 13, 1940 Today's Date: 02/08/2022   History of present illness Katie Bell is an 32yoF who comes to [facility] on 10/[facility] after a fall, scans revealing of right intertrochanteric femur fracture. PMH: HTN, GAD, basal cell carcinoma, GERD, renal lithiasis. Pt taken to theatre under direction of Renee Harder for IM nail fixation, then made WBAT. At baseline pt lives with son, most household AMB without device, still drives occasionally, no recent balance or falls issues. Rapid response was called on 10/10, pt found to have acute/subacute infarcts. Additional workup revealing for urinary retention and bacteremia. Pt found to have acute bilat DVT, anticoagulation delayed inititally due to concerns of cerebral hemorrhage, place on DAPT until 10/17 when she was started on eliquis.   OT comments  Pt seen for OT tx. Pt noted to have had BM in bed. 2 person assist required with MAX VC for sequencing for log rolling and maintaining sidelying with VC for hold onto bed rails to improve pt participation and limit fear of falling, while nurse tech provided MAX A for pericare in sidelying. Pt tearful, endorsing her buttocks hurt. RN called by nurse tech at end of session to assess skin for possible intervention. Pt limited this morning by continued pain with any movement of RLE but did tolerate rolling fairly well but required consistent reassurance and encouragement. Pt continues to benefit from skilled OT services.    Recommendations for follow up therapy are one component of a multi-disciplinary discharge planning process, led by the attending physician.  Recommendations may be updated based on patient status, additional functional criteria and insurance authorization.    Follow Up Recommendations  Skilled nursing-short term rehab (<3 hours/day)    Assistance Recommended at Discharge Frequent or constant Supervision/Assistance   Patient can return home with the following  A lot of help with walking and/or transfers;A lot of help with bathing/dressing/bathroom;Assistance with cooking/housework;Assist for transportation;Help with stairs or ramp for entrance;Assistance with feeding;Direct supervision/assist for medications management;Direct supervision/assist for financial management   Equipment Recommendations  BSC/3in1;Other (comment) [facility])    Recommendations for Other Services      Precautions / Restrictions Precautions Precautions: Fall Restrictions Weight Bearing Restrictions: Yes RLE Weight Bearing: Weight bearing as tolerated       Mobility Bed Mobility Overal bed mobility: Needs Assistance Bed Mobility: Rolling Rolling: Max assist         General bed mobility comments: MAX A rolling side to side for pericare    Transfers                         Balance                                           ADL either performed or assessed with clinical judgement   ADL Overall ADL's : Needs assistance/impaired     Grooming: Bed level;Maximal assistance;Wash/dry face   Upper Body Bathing: Bed level;Maximal assistance   Lower Body Bathing: Bed level;Maximal assistance   Upper Body Dressing : Bed level;Maximal assistance           Toileting- Clothing Manipulation and Hygiene: Bed level;Maximal assistance;+2 for physical assistance Toileting - Clothing Manipulation Details (indicate cue type and reason): MAX A for pericare in sidelying from nurse tech with MAX A to maintain sidelying from OT to facilitate  Extremity/Trunk Assessment              Vision       Perception     Praxis      Cognition Arousal/Alertness: Awake/alert (but keeps her eyes closed) Behavior During Therapy: Restless Overall Cognitive Status: No family/caregiver present to determine baseline cognitive functioning                                  General Comments: pt oriented to self, able to follow simple commands with intermittent VC, fearful of pain requiring consistent reassurance/encouragement        Exercises      Shoulder Instructions       General Comments      Pertinent Vitals/ Pain       Pain Assessment Pain Assessment: Faces Faces Pain Scale: Hurts even more Pain Location: R hip with any RLE movement Pain Descriptors / Indicators: Moaning, Crying, Grimacing, Guarding Pain Intervention(s): Limited activity within patient's tolerance, Monitored during session, Repositioned  Home Living                                          Prior Functioning/Environment              Frequency  Min 2X/week        Progress Toward Goals  OT Goals(current goals can now be found in the care plan section)  Progress towards OT goals: Not progressing toward goals - comment (progressing slowly)  Acute Rehab OT Goals Patient Stated Goal: stand OT Goal Formulation: With patient/family Time For Goal Achievement: 02/14/22 Potential to Achieve Goals: Good  Plan Discharge plan remains appropriate;Frequency remains appropriate    Co-evaluation                 AM-PAC OT "6 Clicks" Daily Activity     Outcome Measure   Help from another person eating meals?: A Lot Help from another person taking care of personal grooming?: A Lot Help from another person toileting, which includes using toliet, bedpan, or urinal?: Total Help from another person bathing (including washing, rinsing, drying)?: A Lot Help from another person to put on and taking off regular upper body clothing?: A Lot Help from another person to put on and taking off regular lower body clothing?: A Lot 6 Click Score: 11    End of Session    OT Visit Diagnosis: Other abnormalities of gait and mobility (R26.89);Muscle weakness (generalized) (M62.81);History of falling (Z91.81);Pain Pain - Right/Left: Right Pain - part of body:  Hip   Activity Tolerance Patient limited by pain   Patient Left in bed;with call bell/phone within reach;with bed alarm set;Other (comment) (nurse tech waiting on RN to assess skin in sidelying after pericare/bathing)   Nurse Communication          Time: 2694-8546 OT Time Calculation (min): 34 min  Charges: OT General Charges $OT Visit: 1 Visit OT Treatments $Self Care/Home Management : 23-37 mins  Ardeth Perfect., MPH, MS, OTR/L ascom [phone] 02/08/22, 11:19 AM

## 2022-02-08 NOTE — Consult Note (Signed)
PHARMACY CONSULT NOTE FOR:  OUTPATIENT  PARENTERAL ANTIBIOTIC THERAPY (OPAT)  Indication: Klebsiella bacteremia with possible endocarditis Regimen: Ceftriaxone 2g IV Q24H End date: 03/12/2022  IV antibiotic discharge orders are pended. To discharging provider:  please sign these orders via discharge navigator,  Select New Orders & click on the button choice - Manage This Unsigned Work.    Labs weekly on Monday while on IV antibiotics: CBC with differential, CMP Fax weekly lab results promptly to [phone]  Please pull PICC at completion of IV antibiotics   Thank you for allowing pharmacy to be a part of this patient's care.  Gretel Acre, PharmD PGY1 Pharmacy Resident 02/08/2022 2:10 PM

## 2022-02-08 NOTE — Treatment Plan (Addendum)
Diagnosis: Klebsiella bacteremia Possible endocarditis Baseline Creatinine <1    Allergies  Allergen Reactions   Sulfamethoxazole-Trimethoprim Swelling    Angioedema and pruritis per 01/01/2017 documentation in care everywhere   Estradiol Other (See Comments)    Vaginal irritation Other reaction(s): Other (See Comments) Vaginal irritation   Morphine     agitation   Sertraline Hcl Other (See Comments)    Migraine headaches, severe   Morphine And Related Anxiety    OPAT Orders Discharge antibiotics: Ceftriaxone 2 grams IV every 24 hours  6 weeks End Date: 03/12/22  [facility] Care Per Protocol:  Labs weekly on Monday while on IV antibiotics: _X_ CBC with differential  _X_ CMP _  _X_ Please pull PIC at completion of IV antibiotics   Fax weekly lab results  promptly to [phone]  Clinic Follow Up Appt: [place] visit 02/22/22 at 10.35Am   Call [phone] with any questions

## 2022-02-08 NOTE — TOC Progression Note (Signed)
Transition of Care [facility]) - Progression Note    Patient Details  Name: Katie Bell MRN: [id number] Date of Birth: 05-24-1940  Transition of Care [facility]) CM/SW [place], RN Phone Number: 02/08/2022, 11:28 AM  Clinical Narrative:     Ins approved to go to [facility], 10/19-23 ref number [id number]  Expected Discharge Plan: [place] Barriers to Discharge: Insurance Authorization  Expected Discharge Plan and Services Expected Discharge Plan: [place] In-house Referral: Clinical Social Work                                             Social Determinants of Health (SDOH) Interventions    Readmission Risk Interventions     No data to display

## 2022-02-08 NOTE — Progress Notes (Signed)
Physical Therapy Treatment Patient Details Name: Katie Bell MRN: [id number] DOB: 01-Sep-1940 Today's Date: 02/08/2022   History of Present Illness Karuna Balducci is an 57yoF who comes to [facility] on 10/4 after a fall, scans revealing of right intertrochanteric femur fracture. PMH: HTN, GAD, basal cell carcinoma, GERD, renal lithiasis. Pt taken to theatre under direction of Renee Harder for IM nail fixation, then made WBAT. At baseline pt lives with son, most household AMB without device, still drives occasionally, no recent balance or falls issues. Rapid response was called on 10/10, pt found to have acute/subacute infarcts. Additional workup revealing for urinary retention and bacteremia. Pt found to have acute bilat DVT, anticoagulation delayed inititally due to concerns of cerebral hemorrhage, place on DAPT until 10/17 when she was started on eliquis.    PT Comments    Pt in bed supine but awake, safety mittens in place. Pt is interactive and social, but remains slurred of speech and tangential, mentation remains scattered at times, disoriented to situation, difficulty with insight and memory. Pt partakes in bed level exercises of BLE, does not appear to have more than the mildest signs of surgical side pain AEB aversion to equal number of reps on the right leg and need for more assistance with limb. Pt is tangential, frequently pulling things she is watching TV into our conversation. At one point there is a commercial with multiple dogs and the patient grins largely, remarks on how much she loves dogs. This is the most alert she has been for author the entire admission, however her cognitive impairment remains a significant limiter wherein her falls anxiety and pain avoidance override all other long-term goals for mobility. Pt positioned upright and tall in bed at EOS, HOB >50 degrees. Pt appears comfortable, still watching Let's make a deal. All needs met. Will plan on more EOB activity next  session as performed previous day with out services.     Recommendations for follow up therapy are one component of a multi-disciplinary discharge planning process, led by the attending physician.  Recommendations may be updated based on patient status, additional functional criteria and insurance authorization.  Follow Up Recommendations  Skilled nursing-short term rehab (<3 hours/day) Can patient physically be transported by private vehicle: No   Assistance Recommended at Discharge Frequent or constant Supervision/Assistance  Patient can return home with the following Two people to help with walking and/or transfers;Two people to help with bathing/dressing/bathroom;Direct supervision/assist for medications management;Direct supervision/assist for financial management;Assist for transportation;Assistance with cooking/housework;Assistance with feeding;Help with stairs or ramp for entrance   Equipment Recommendations       Recommendations for Other Services       Precautions / Restrictions Precautions Precautions: Fall Restrictions RLE Weight Bearing: Weight bearing as tolerated     Mobility  Bed Mobility                    Transfers                        Ambulation/Gait                   Stairs             Wheelchair Mobility    Modified Rankin (Stroke Patients Only)       Balance  Cognition Arousal/Alertness: Awake/alert Behavior During Therapy: Impulsive, WFL for tasks assessed/performed   Area of Impairment: Awareness, Memory, Orientation, Attention, Following commands, Safety/judgement                 Orientation Level: Disoriented to, Time, Situation Current Attention Level: Divided, Alternating Memory: Decreased recall of precautions, Decreased short-term memory Following Commands: Follows one step commands inconsistently Safety/Judgement: Decreased  awareness of safety, Decreased awareness of deficits Awareness: Emergent, Anticipatory            Exercises General Exercises - Lower Extremity Short Arc Quad: AAROM, Both, 15 reps, Supine, Limitations Short Arc Quad Limitations: less able to follow commands on Rt, suspect due to pain inhibition Heel Slides: Limitations, AAROM, Both, 15 reps Heel Slides Limitations: does well with a combination of verbal and tactile cues, Rt side is slightly guarded but pt able to complete Hip ABduction/ADduction: AAROM, Both, 15 reps, Limitations Hip Abduction/Adduction Limitations: does well with a combination of verbal and tactile cues, Rt side is slightly guarded but pt able to complete    General Comments        Pertinent Vitals/Pain Pain Assessment Pain Assessment: Faces Faces Pain Scale: Hurts a little bit Pain Location: slight movement aversion with RLE after ~5-6 reps    Home Living                          Prior Function            PT Goals (current goals can now be found in the care plan section) Acute Rehab PT Goals Patient Stated Goal: none stated PT Goal Formulation: With family Time For Goal Achievement: 02/14/22 Potential to Achieve Goals: Fair Progress towards PT goals: Not progressing toward goals - comment    Frequency    7X/week      PT Plan Current plan remains appropriate    Co-evaluation              AM-PAC PT "6 Clicks" Mobility   Outcome Measure  Help needed turning from your back to your side while in a flat bed without using bedrails?: Total Help needed moving from lying on your back to sitting on the side of a flat bed without using bedrails?: Total Help needed moving to and from a bed to a chair (including a wheelchair)?: A Lot Help needed standing up from a chair using your arms (e.g., wheelchair or bedside chair)?: A Lot Help needed to walk in hospital room?: Total Help needed climbing 3-5 steps with a railing? : Total 6  Click Score: 8    End of Session   Activity Tolerance: Patient tolerated treatment well;No increased pain Patient left: in bed;with bed alarm set;with call bell/phone within reach (in recliner position; not safe to move to chair right now due to mentation) Nurse Communication: Mobility status PT Visit Diagnosis: Muscle weakness (generalized) (M62.81);History of falling (Z91.81)     Time: 7322-0254 PT Time Calculation (min) (ACUTE ONLY): 10 min  Charges:  $Therapeutic Exercise: 8-22 mins                    3:30 PM, 02/08/22 Etta Grandchild, PT, DPT Physical Therapist - [facility]  [phone] ([place])    Kaileen Bronkema C 02/08/2022, 3:24 PM

## 2022-02-08 NOTE — TOC Progression Note (Signed)
Transition of Care [facility]) - Progression Note    Patient Details  Name: Katie Bell MRN: [id number] Date of Birth: 10/04/1940  Transition of Care [facility] CM/SW [place], RN Phone Number: 02/08/2022, 11:10 AM  Clinical Narrative:    Damaris Schooner with [name] the patients son, he confirmed that the plan is to still go to [facility] at [place], I explained that I would get the Ins process started for [place] once Ins approved, he stated agreeance   Expected Discharge Plan: Skilled Nursing Facility Barriers to Discharge: Insurance Authorization  Expected Discharge Plan and Services Expected Discharge Plan: [place] In-house Referral: Clinical Social Work                                             Social Determinants of Health (SDOH) Interventions    Readmission Risk Interventions     No data to display

## 2022-02-08 NOTE — Plan of Care (Signed)

## 2022-02-08 NOTE — Plan of Care (Signed)

## 2022-02-08 NOTE — Progress Notes (Signed)
PROGRESS NOTE    Katie Bell  JOI:[id number] DOB: 06/10/1940 DOA: 01/24/2022 PCP: Venia Carbon, MD  Assessment & Plan:   Principal Problem:   Closed right femoral fracture [facility]) Active Problems:   Hypertension   Severe sepsis ([place])   Leucocytosis   Fall at home, initial encounter   Bacteremia due to Klebsiella pneumoniae   CVA (cerebral vascular accident) ([place])   AKI (acute kidney injury) ([place])   Hypokalemia   Closed fracture of right hip ([place])   Protein-calorie malnutrition, severe   Acute deep vein thrombosis (DVT) of tibial vein of both lower extremities (HCC)   Acute deep vein thrombosis (DVT) of left femoral vein (HCC)  Assessment and Plan: Acute embolic stroke: repeat CT head on 02/06/2022 showed evolving right parietal infarct with associated petechial hemorrhage and no evidence of parenchymal hematoma. Continue on eliquis as per neuro. Echo showed EF estimated at 55 to 60%, indeterminate LV diastolic parameters, no evidence of vegetations.  [name], son, declines TEE at this time.   Dysphagia: continue on dysphagia II diet as per speech    Acute lower extremity DVT: 1 right peroneal vein and left posterior tibial vein DVT.  Repeat venous duplex of the lower extremities on 02/05/2022 showed left common femoral vein thrombosis extending up to the saphenofemoral junction. Continue on eliquis    Severe sepsis: secondary to Klebsiella pneumoniae bacteremia, severe leukocytosis, recent fever (Tmax 102.1 F) on 01/30/2022. Continue on IV rocephin x 6 weeks total as per ID.  Urine cx grows yeast. Severe sepsis resolved   Bacteremia: blood cx growing klebsiella. Pt's family did not want a TEE. Continue on IV ceftriaxone x 6 weeks total as per ID for possible endocarditis.    Right hip fracture: w/p right intramedullary nail intertrochanteric on 01/25/2022.  Outpatient follow-up with Dr. Sharlet Salina, orthopedic surg, for staple removal and x-rays.   Acute blood loss anemia: H&H  are stable. Will transfuse if Hb < 7.0    Acute urinary retention: foley catheter was placed on 02/01/2022 after multiple in and out urethral catheterization. Will try voiding trial today.    Hypokalemia: WNL today   Hypomagnesemia: WNL today   AKI: resolved  Acute hypoxic respiratory failure: resolved      DVT prophylaxis: eliquis  Code Status: full  Family Communication:  Disposition Plan: likely d/c to SNF  Level of care: Med-Surg  Status is: Inpatient Remains inpatient appropriate because: severity of illness, needs a PICC line placed for long term abxs     Consultants:  ID Vasc surg Neuro   Procedures:  Antimicrobials: rocephin    Subjective: Pt c/o headache   Objective: Vitals:   02/07/22 0757 02/07/22 1519 02/07/22 2352 02/08/22 0745  BP: (!) 166/75 136/76 (!) 145/74 117/81  Pulse: 100 (!) 110 98 98  Resp: '17 17 16 16  '$ Temp: 98 F (36.7 C) 98.1 F (36.7 C) 99.2 F (37.3 C) (!) 97.4 F (36.3 C)  TempSrc: Oral Oral    SpO2: 97% 97% 98% 100%  Weight:      Height:        Intake/Output Summary (Last 24 hours) at 02/08/2022 0747 Last data filed at 02/08/2022 0523 Gross per 24 hour  Intake 120 ml  Output 800 ml  Net -680 ml   Filed Weights   01/24/22 1614  Weight: 49.9 kg    Examination:  General exam: Appears uncomfortable. Frail appearing  Respiratory system: clear breath sounds b/l  Cardiovascular system: S1/S2+. No rubs or gallops  Gastrointestinal system: Abd is soft, NT, ND & hypoactive bowel sounds  Central nervous system: Alert and awake. Moves all extremities   Psychiatry: Judgement and insight appears poor. Flat mood and affect     Data Reviewed: I have personally reviewed following labs and imaging studies  CBC: Recent Labs  Lab 02/02/22 0449 02/03/22 0701 02/04/22 0446 02/05/22 0511 02/07/22 0439 02/08/22 0449  WBC 25.3* 16.3* 19.5* 14.0* 11.5* 11.0*  NEUTROABS 22.2* 13.9* 16.6* 11.1* 9.2*  --   HGB 9.5* 9.0*  8.6* 8.4* 9.1* 9.9*  HCT 27.9* 26.5* 25.7* 25.8* 27.7* 30.3*  MCV 95.2 94.6 94.5 95.9 96.5 99.0  PLT 120* 152 203 262 395 539   Basic Metabolic Panel: Recent Labs  Lab 02/02/22 0449 02/03/22 0701 02/04/22 0446 02/05/22 0511 02/07/22 0439 02/08/22 0449  NA 145 143 141 141 139 140  K 3.4* 3.0* 3.3* 3.9 3.0* 3.9  CL 117* 114* 115* 115* 111 116*  CO2 20* '23 23 22 23 '$ 21*  GLUCOSE 107* 139* 101* 111* 119* 104*  BUN 25* '16 12 10 8 10  '$ CREATININE 0.86 0.89 0.74 0.73 0.81 0.89  CALCIUM 8.4* 7.9* 7.7* 8.0* 8.3* 8.2*  MG 1.8 1.6* 2.2  --  2.0  --   PHOS  --  3.4 2.9  --   --   --    GFR: Estimated Creatinine Clearance: 35 mL/min (by C-G formula based on SCr of 0.89 mg/dL). Liver Function Tests: Recent Labs  Lab 02/04/22 0446  ALBUMIN 2.3*   No results for input(s): "LIPASE", "AMYLASE" in the last 168 hours. No results for input(s): "AMMONIA" in the last 168 hours. Coagulation Profile: No results for input(s): "INR", "PROTIME" in the last 168 hours. Cardiac Enzymes: No results for input(s): "CKTOTAL", "CKMB", "CKMBINDEX", "TROPONINI" in the last 168 hours. BNP (last 3 results) No results for input(s): "PROBNP" in the last 8760 hours. HbA1C: No results for input(s): "HGBA1C" in the last 72 hours. CBG: No results for input(s): "GLUCAP" in the last 168 hours. Lipid Profile: No results for input(s): "CHOL", "HDL", "LDLCALC", "TRIG", "CHOLHDL", "LDLDIRECT" in the last 72 hours. Thyroid Function Tests: No results for input(s): "TSH", "T4TOTAL", "FREET4", "T3FREE", "THYROIDAB" in the last 72 hours. Anemia Panel: No results for input(s): "VITAMINB12", "FOLATE", "FERRITIN", "TIBC", "IRON", "RETICCTPCT" in the last 72 hours. Sepsis Labs: No results for input(s): "PROCALCITON", "LATICACIDVEN" in the last 168 hours.  Recent Results (from the past 240 hour(s))  Culture, blood (Routine X 2) w Reflex to ID Panel     Status: Abnormal   Collection Time: 01/30/22  3:04 PM   Specimen: BLOOD   Result Value Ref Range Status   Specimen Description   Final    BLOOD RIGHT ANTECUBITAL Performed at [facility], [address], [place], [place] [address]    Special Requests   Final    BOTTLES DRAWN AEROBIC AND ANAEROBIC Blood Culture adequate volume Performed at [facility], [address] [place], [place] [address]    Culture  Setup Time   Final    GRAM NEGATIVE RODS IN BOTH AEROBIC AND ANAEROBIC BOTTLES CRITICAL RESULT CALLED TO, READ BACK BY AND VERIFIED WITH: BRANDON BEERS @ 0240 01/31/22 LFD CRITICAL VALUE NOTED.  VALUE IS CONSISTENT WITH PREVIOUSLY REPORTED AND CALLED VALUE.    Culture (A)  Final    KLEBSIELLA PNEUMONIAE SUSCEPTIBILITIES PERFORMED ON PREVIOUS CULTURE WITHIN THE LAST 5 DAYS. Performed at [facility], [place] [address], [place], [place] [address]  Report Status 02/03/2022 FINAL  Final  Culture, blood (Routine X 2) w Reflex to ID Panel     Status: Abnormal   Collection Time: 01/30/22  3:04 PM   Specimen: BLOOD  Result Value Ref Range Status   Specimen Description   Final    BLOOD LEFT ANTECUBITAL Performed at [facility], [address], [place], [place] [address]    Special Requests   Final    BOTTLES DRAWN AEROBIC AND ANAEROBIC Blood Culture adequate volume Performed at [facility], [address] [place], [place] [address]    Culture  Setup Time   Final    GRAM NEGATIVE RODS IN BOTH AEROBIC AND ANAEROBIC BOTTLES Organism ID to follow CRITICAL RESULT CALLED TO, READ BACK BY AND VERIFIED WITH: BRANDON BEERS @ 4332 01/31/22 LFD Performed at [facility], [address] [place], [place] [address]    Culture KLEBSIELLA PNEUMONIAE (A)  Final   Report Status 02/03/2022 FINAL  Final   Organism ID, Bacteria KLEBSIELLA PNEUMONIAE  Final      Susceptibility   Klebsiella pneumoniae - MIC*    AMPICILLIN >=32 RESISTANT Resistant     CEFAZOLIN <=4 SENSITIVE Sensitive     CEFEPIME <=0.12  SENSITIVE Sensitive     CEFTAZIDIME <=1 SENSITIVE Sensitive     CEFTRIAXONE <=0.25 SENSITIVE Sensitive     CIPROFLOXACIN <=0.25 SENSITIVE Sensitive     GENTAMICIN <=1 SENSITIVE Sensitive     IMIPENEM <=0.25 SENSITIVE Sensitive     TRIMETH/SULFA <=20 SENSITIVE Sensitive     AMPICILLIN/SULBACTAM 8 SENSITIVE Sensitive     PIP/TAZO <=4 SENSITIVE Sensitive     * KLEBSIELLA PNEUMONIAE  Blood Culture ID Panel (Reflexed)     Status: Abnormal   Collection Time: 01/30/22  3:04 PM  Result Value Ref Range Status   Enterococcus faecalis NOT DETECTED NOT DETECTED Final   Enterococcus Faecium NOT DETECTED NOT DETECTED Final   Listeria monocytogenes NOT DETECTED NOT DETECTED Final   Staphylococcus species NOT DETECTED NOT DETECTED Final   Staphylococcus aureus (BCID) NOT DETECTED NOT DETECTED Final   Staphylococcus epidermidis NOT DETECTED NOT DETECTED Final   Staphylococcus lugdunensis NOT DETECTED NOT DETECTED Final   Streptococcus species NOT DETECTED NOT DETECTED Final   Streptococcus agalactiae NOT DETECTED NOT DETECTED Final   Streptococcus pneumoniae NOT DETECTED NOT DETECTED Final   Streptococcus pyogenes NOT DETECTED NOT DETECTED Final   A.calcoaceticus-baumannii NOT DETECTED NOT DETECTED Final   Bacteroides fragilis NOT DETECTED NOT DETECTED Final   Enterobacterales DETECTED (A) NOT DETECTED Final    Comment: Enterobacterales represent a large order of gram negative bacteria, not a single organism. CRITICAL RESULT CALLED TO, READ BACK BY AND VERIFIED WITH: BRANDON BEERS @ 4166 01/31/22 LFD    Enterobacter cloacae complex NOT DETECTED NOT DETECTED Final   Escherichia coli NOT DETECTED NOT DETECTED Final   Klebsiella aerogenes NOT DETECTED NOT DETECTED Final   Klebsiella oxytoca NOT DETECTED NOT DETECTED Final   Klebsiella pneumoniae DETECTED (A) NOT DETECTED Final    Comment: CRITICAL RESULT CALLED TO, READ BACK BY AND VERIFIED WITH: BRANDON BEERS @ 0630 01/31/22 LFD    Proteus  species NOT DETECTED NOT DETECTED Final   Salmonella species NOT DETECTED NOT DETECTED Final   Serratia marcescens NOT DETECTED NOT DETECTED Final   Haemophilus influenzae NOT DETECTED NOT DETECTED Final   Neisseria meningitidis NOT DETECTED NOT DETECTED Final   Pseudomonas aeruginosa NOT DETECTED NOT DETECTED Final   Stenotrophomonas maltophilia NOT DETECTED NOT DETECTED  Final   Candida albicans NOT DETECTED NOT DETECTED Final   Candida auris NOT DETECTED NOT DETECTED Final   Candida glabrata NOT DETECTED NOT DETECTED Final   Candida krusei NOT DETECTED NOT DETECTED Final   Candida parapsilosis NOT DETECTED NOT DETECTED Final   Candida tropicalis NOT DETECTED NOT DETECTED Final   Cryptococcus neoformans/gattii NOT DETECTED NOT DETECTED Final   CTX-M ESBL NOT DETECTED NOT DETECTED Final   Carbapenem resistance IMP NOT DETECTED NOT DETECTED Final   Carbapenem resistance KPC NOT DETECTED NOT DETECTED Final   Carbapenem resistance NDM NOT DETECTED NOT DETECTED Final   Carbapenem resist OXA 48 LIKE NOT DETECTED NOT DETECTED Final   Carbapenem resistance VIM NOT DETECTED NOT DETECTED Final    Comment: Performed at [facility], [address] [place], [place] [address]  MRSA Next Gen by PCR, Nasal     Status: None   Collection Time: 01/30/22  5:26 PM   Specimen: Nasal Mucosa; Nasal Swab  Result Value Ref Range Status   MRSA by PCR Next Gen NOT DETECTED NOT DETECTED Final    Comment: (NOTE) The GeneXpert MRSA Assay (FDA approved for NASAL specimens only), is one component of a comprehensive MRSA colonization surveillance program. It is not intended to diagnose MRSA infection nor to guide or monitor treatment for MRSA infections. Test performance is not FDA approved in patients less than 54 years old. Performed at [facility], [address], [place], [place] [address]   Urine Culture     Status: Abnormal   Collection Time: 01/30/22  8:44 PM   Specimen: Urine,  Clean Catch  Result Value Ref Range Status   Specimen Description   Final    URINE, CLEAN CATCH Performed at [facility], [address] [place], [place] [address]    Special Requests   Final    NONE Performed at [facility], [place], [place] [address]    Culture 50,000 COLONIES/mL YEAST (A)  Final   Report Status 02/01/2022 FINAL  Final  Culture, blood (Routine X 2) w Reflex to ID Panel     Status: None   Collection Time: 02/02/22  4:49 AM   Specimen: BLOOD  Result Value Ref Range Status   Specimen Description BLOOD LEFT WRIST  Final   Special Requests   Final    BOTTLES DRAWN AEROBIC AND ANAEROBIC Blood Culture adequate volume   Culture   Final    NO GROWTH 5 DAYS Performed at [facility], [address] [place], [place] [address]    Report Status 02/07/2022 FINAL  Final  Culture, blood (Routine X 2) w Reflex to ID Panel     Status: None   Collection Time: 02/02/22  6:23 AM   Specimen: Left Antecubital; Blood  Result Value Ref Range Status   Specimen Description LEFT ANTECUBITAL  Final   Special Requests   Final    BOTTLES DRAWN AEROBIC AND ANAEROBIC BACTEROIDES CACCAE   Culture   Final    NO GROWTH 5 DAYS Performed at [facility], [address] [place], [place] [address]    Report Status 02/07/2022 FINAL  Final         Radiology Studies: CT HEAD WO CONTRAST (5MM)  Result Date: 02/06/2022 CLINICAL DATA:  Stroke follow-up EXAM: CT HEAD WITHOUT CONTRAST TECHNIQUE: Contiguous axial images were obtained from the base of the skull through the vertex without intravenous contrast. RADIATION DOSE REDUCTION: This exam was performed according  to the departmental dose-optimization program which includes automated exposure control, adjustment of the mA and/or kV according to patient size and/or use of iterative reconstruction technique. COMPARISON:  02/04/22 FINDINGS: Brain: Evolving right parietal infarct with  associated petechial hemorrhage. No evidence of a parenchymal hematoma. Unchanged hypodensity in the external capsule on the right. Compared to prior exam there is slight interval increase in conspicuity of the hypodensity in the posterior limb of the internal capsule on left (series 100, image 24). No hydrocephalus. No extra-axial fluid collection. Vascular: No hyperdense vessel or unexpected calcification. Skull: Normal. Negative for fracture or focal lesion. Sinuses/Orbits: Trace left mastoid effusion and mild mucosal thickening in the right sphenoid sinus. Bilateral lens replacement. Other: None. IMPRESSION: 1. Evolving right parietal infarct with associated petechial hemorrhage. No evidence of a parenchymal hematoma. 2. Possible new hypodensity in the posterior limb of the internal capsule on the left. This is nonspecific and may be artifactual, but if there is clinical concern for new infarct, further evaluation with MRI could be considered. Electronically Signed   By: Marin Roberts M.D.   On: 02/06/2022 09:31        Scheduled Meds:  apixaban  5 mg Oral BID   Chlorhexidine Gluconate Cloth  6 each Topical Daily   feeding supplement (NEPRO CARB STEADY)  237 mL Oral BID BM   multivitamin with minerals  1 tablet Oral Daily   pantoprazole  40 mg Oral Daily   Continuous Infusions:  sodium chloride 20 mL/hr at 02/03/22 1619   cefTRIAXone (ROCEPHIN)  IV 2 g (02/05/22 1512)     LOS: 15 days    Time spent: 25 mins     Wyvonnia Dusky, MD [facility] Pager [phone]  If 7PM-7AM, please contact night-coverage [email or web address] 02/08/2022, 7:47 AM

## 2022-02-09 ENCOUNTER — Inpatient Hospital Stay: Payer: Medicare HMO

## 2022-02-09 DIAGNOSIS — Z86718 Personal history of other venous thrombosis and embolism: Secondary | ICD-10-CM | POA: Diagnosis not present

## 2022-02-09 DIAGNOSIS — S72141A Displaced intertrochanteric fracture of right femur, initial encounter for closed fracture: Secondary | ICD-10-CM | POA: Diagnosis not present

## 2022-02-09 DIAGNOSIS — G9340 Encephalopathy, unspecified: Secondary | ICD-10-CM | POA: Diagnosis not present

## 2022-02-09 DIAGNOSIS — R7881 Bacteremia: Secondary | ICD-10-CM | POA: Diagnosis not present

## 2022-02-09 DIAGNOSIS — M19012 Primary osteoarthritis, left shoulder: Secondary | ICD-10-CM | POA: Diagnosis not present

## 2022-02-09 DIAGNOSIS — M25551 Pain in right hip: Secondary | ICD-10-CM | POA: Diagnosis not present

## 2022-02-09 DIAGNOSIS — R5383 Other fatigue: Secondary | ICD-10-CM | POA: Diagnosis not present

## 2022-02-09 DIAGNOSIS — Z79899 Other long term (current) drug therapy: Secondary | ICD-10-CM | POA: Diagnosis not present

## 2022-02-09 DIAGNOSIS — S7291XA Unspecified fracture of right femur, initial encounter for closed fracture: Secondary | ICD-10-CM | POA: Diagnosis not present

## 2022-02-09 DIAGNOSIS — R339 Retention of urine, unspecified: Secondary | ICD-10-CM | POA: Diagnosis present

## 2022-02-09 DIAGNOSIS — I69391 Dysphagia following cerebral infarction: Secondary | ICD-10-CM | POA: Diagnosis not present

## 2022-02-09 DIAGNOSIS — Z7901 Long term (current) use of anticoagulants: Secondary | ICD-10-CM | POA: Diagnosis not present

## 2022-02-09 DIAGNOSIS — B961 Klebsiella pneumoniae [K. pneumoniae] as the cause of diseases classified elsewhere: Secondary | ICD-10-CM | POA: Diagnosis not present

## 2022-02-09 DIAGNOSIS — G9341 Metabolic encephalopathy: Secondary | ICD-10-CM | POA: Diagnosis not present

## 2022-02-09 DIAGNOSIS — R41841 Cognitive communication deficit: Secondary | ICD-10-CM | POA: Diagnosis not present

## 2022-02-09 DIAGNOSIS — R410 Disorientation, unspecified: Secondary | ICD-10-CM | POA: Diagnosis not present

## 2022-02-09 DIAGNOSIS — K219 Gastro-esophageal reflux disease without esophagitis: Secondary | ICD-10-CM | POA: Diagnosis present

## 2022-02-09 DIAGNOSIS — I700 Atherosclerosis of aorta: Secondary | ICD-10-CM | POA: Diagnosis not present

## 2022-02-09 DIAGNOSIS — Z452 Encounter for adjustment and management of vascular access device: Secondary | ICD-10-CM | POA: Diagnosis not present

## 2022-02-09 DIAGNOSIS — R5381 Other malaise: Secondary | ICD-10-CM | POA: Diagnosis not present

## 2022-02-09 DIAGNOSIS — Z8673 Personal history of transient ischemic attack (TIA), and cerebral infarction without residual deficits: Secondary | ICD-10-CM | POA: Diagnosis not present

## 2022-02-09 DIAGNOSIS — Z7401 Bed confinement status: Secondary | ICD-10-CM | POA: Diagnosis not present

## 2022-02-09 DIAGNOSIS — E870 Hyperosmolality and hypernatremia: Secondary | ICD-10-CM | POA: Diagnosis not present

## 2022-02-09 DIAGNOSIS — Z9049 Acquired absence of other specified parts of digestive tract: Secondary | ICD-10-CM | POA: Diagnosis not present

## 2022-02-09 DIAGNOSIS — I10 Essential (primary) hypertension: Secondary | ICD-10-CM | POA: Diagnosis not present

## 2022-02-09 DIAGNOSIS — N281 Cyst of kidney, acquired: Secondary | ICD-10-CM | POA: Diagnosis not present

## 2022-02-09 DIAGNOSIS — D72823 Leukemoid reaction: Secondary | ICD-10-CM | POA: Diagnosis not present

## 2022-02-09 DIAGNOSIS — E860 Dehydration: Secondary | ICD-10-CM | POA: Diagnosis not present

## 2022-02-09 DIAGNOSIS — T8484XD Pain due to internal orthopedic prosthetic devices, implants and grafts, subsequent encounter: Secondary | ICD-10-CM | POA: Diagnosis not present

## 2022-02-09 DIAGNOSIS — I82443 Acute embolism and thrombosis of tibial vein, bilateral: Secondary | ICD-10-CM | POA: Diagnosis not present

## 2022-02-09 DIAGNOSIS — R338 Other retention of urine: Secondary | ICD-10-CM | POA: Diagnosis not present

## 2022-02-09 DIAGNOSIS — E861 Hypovolemia: Secondary | ICD-10-CM | POA: Diagnosis present

## 2022-02-09 DIAGNOSIS — Z87891 Personal history of nicotine dependence: Secondary | ICD-10-CM | POA: Diagnosis not present

## 2022-02-09 DIAGNOSIS — R102 Pelvic and perineal pain: Secondary | ICD-10-CM | POA: Diagnosis not present

## 2022-02-09 DIAGNOSIS — R000 Tachycardia, unspecified: Secondary | ICD-10-CM | POA: Diagnosis not present

## 2022-02-09 DIAGNOSIS — E43 Unspecified severe protein-calorie malnutrition: Secondary | ICD-10-CM | POA: Diagnosis not present

## 2022-02-09 DIAGNOSIS — Z825 Family history of asthma and other chronic lower respiratory diseases: Secondary | ICD-10-CM | POA: Diagnosis not present

## 2022-02-09 DIAGNOSIS — R29898 Other symptoms and signs involving the musculoskeletal system: Secondary | ICD-10-CM | POA: Diagnosis not present

## 2022-02-09 DIAGNOSIS — Z811 Family history of alcohol abuse and dependence: Secondary | ICD-10-CM | POA: Diagnosis not present

## 2022-02-09 DIAGNOSIS — M6281 Muscle weakness (generalized): Secondary | ICD-10-CM | POA: Diagnosis not present

## 2022-02-09 DIAGNOSIS — R4182 Altered mental status, unspecified: Secondary | ICD-10-CM | POA: Diagnosis not present

## 2022-02-09 DIAGNOSIS — S72144D Nondisplaced intertrochanteric fracture of right femur, subsequent encounter for closed fracture with routine healing: Secondary | ICD-10-CM | POA: Diagnosis not present

## 2022-02-09 DIAGNOSIS — L22 Diaper dermatitis: Secondary | ICD-10-CM | POA: Diagnosis not present

## 2022-02-09 DIAGNOSIS — N3000 Acute cystitis without hematuria: Secondary | ICD-10-CM | POA: Diagnosis not present

## 2022-02-09 DIAGNOSIS — Z85828 Personal history of other malignant neoplasm of skin: Secondary | ICD-10-CM | POA: Diagnosis not present

## 2022-02-09 DIAGNOSIS — N179 Acute kidney failure, unspecified: Secondary | ICD-10-CM | POA: Diagnosis not present

## 2022-02-09 DIAGNOSIS — Z85820 Personal history of malignant melanoma of skin: Secondary | ICD-10-CM | POA: Diagnosis not present

## 2022-02-09 DIAGNOSIS — M19011 Primary osteoarthritis, right shoulder: Secondary | ICD-10-CM | POA: Diagnosis not present

## 2022-02-09 DIAGNOSIS — M6259 Muscle wasting and atrophy, not elsewhere classified, multiple sites: Secondary | ICD-10-CM | POA: Diagnosis not present

## 2022-02-09 DIAGNOSIS — Z681 Body mass index (BMI) 19 or less, adult: Secondary | ICD-10-CM | POA: Diagnosis not present

## 2022-02-09 LAB — CBC
HCT: 30.9 % — ABNORMAL LOW (ref 36.0–46.0)
Hemoglobin: 9.9 g/dL — ABNORMAL LOW (ref 12.0–15.0)
MCH: 31.5 pg (ref 26.0–34.0)
MCHC: 32 g/dL (ref 30.0–36.0)
MCV: 98.4 fL (ref 80.0–100.0)
Platelets: 450 10*3/uL — ABNORMAL HIGH (ref 150–400)
RBC: 3.14 MIL/uL — ABNORMAL LOW (ref 3.87–5.11)
RDW: 13.6 % (ref 11.5–15.5)
WBC: 14.4 10*3/uL — ABNORMAL HIGH (ref 4.0–10.5)
nRBC: 0 % (ref 0.0–0.2)

## 2022-02-09 LAB — BASIC METABOLIC PANEL
Anion gap: 6 (ref 5–15)
BUN: 11 mg/dL (ref 8–23)
CO2: 24 mmol/L (ref 22–32)
Calcium: 8.7 mg/dL — ABNORMAL LOW (ref 8.9–10.3)
Chloride: 113 mmol/L — ABNORMAL HIGH (ref 98–111)
Creatinine, Ser: 0.73 mg/dL (ref 0.44–1.00)
GFR, Estimated: >60 mL/min (ref >60)
Glucose, Bld: 115 mg/dL — ABNORMAL HIGH (ref 70–99)
Potassium: 3.7 mmol/L (ref 3.5–5.1)
Sodium: 143 mmol/L (ref 135–145)

## 2022-02-09 LAB — MAGNESIUM: Magnesium: 2.1 mg/dL (ref 1.7–2.4)

## 2022-02-09 MED ORDER — HYDROCODONE-ACETAMINOPHEN 5-325 MG PO TABS: 1.0000 | ORAL_TABLET | Freq: Four times a day (QID) | ORAL | 0 refills | Status: AC | PRN

## 2022-02-09 MED ORDER — CEFTRIAXONE IV (FOR PTA / DISCHARGE USE ONLY): 2.0000 g | INTRAVENOUS | 0 refills | Status: AC

## 2022-02-09 MED ORDER — SODIUM CHLORIDE 0.9% FLUSH: 10.0000 mL | Freq: Two times a day (BID) | INTRAVENOUS | Status: DC

## 2022-02-09 MED ORDER — APIXABAN 5 MG PO TABS: 5.0000 mg | ORAL_TABLET | Freq: Two times a day (BID) | ORAL | 0 refills | Status: AC

## 2022-02-09 MED ORDER — SODIUM CHLORIDE 0.9% FLUSH: 10.0000 mL | INTRAVENOUS | Status: DC | PRN

## 2022-02-09 NOTE — Discharge Summary (Signed)
Physician Discharge Summary  Katie Bell DJM:[id number] DOB: [birth year] DOA: 01/24/2022  PCP: Venia Carbon, MD  Admit date: 01/24/2022 Discharge date: 02/09/2022  Admitted From: home  Disposition:  SNF  Recommendations for Outpatient Follow-up:  Follow up with PCP in 1-2 weeks F/u w/ ortho surg, Dr. Sharlet Salina, in 1-2 weeks F/u w/ ID, Dr. Delaine Lame, Clinic Follow Up Appt: TVideo visit 02/22/22 at 10.35Am F/u w/ neuro, Dr. Melrose Nakayama or Dr. Manuella Ghazi, in 2-3 weeks   Home Health: no  Equipment/Devices:  Discharge Condition: stable  CODE STATUS: full  Diet recommendation: Dysphagia 2 diet: fluid consistency is nectar thick. Extra gravies on meats, potatoes. Yogurt, puddings, fruits  Brief/Interim Summary: HPI was taken from Dr. Jonelle Sidle: Katie Bell is a 81 y.o. female with medical history significant of essential hypertension, anxiety disorder, previous history of basal cell carcinoma, GERD and kidney stones who presents to the ER after sustaining a mechanical fall at home.  Patient apparently slipped and fell down her stairs.  Sustained pain on the right hip with noticeable deformity.  Did not hit her head.  No evidence of other bodily injuries.  Patient brought to the ER where she was noted to have right intertrochanteric fracture.  Orthopedics consulted and plan to do operative repair tomorrow.  Patient being admitted to the medical service.  She has no new complaints.  Pain is well controlled for the most part.  Currently down to 6 out of 10 after pain medications.  Otherwise no new complaint.  As per Dr. Mal Misty: Katie Bell is a 81 y.o. female medical history significant for hypertension, anxiety, history of basal cell carcinoma, GERD, kidney stones, who presented to the hospital after sustaining a mechanical fall at home.  She was found to have right hip fracture.   As per Dr. Jimmye Norman 10/18-10/20/23: Pt has remained medically stable the 3 days that I was taking care of the pt.  Of note, echo was done & no evidence of vegetations were found but ID wanted the pt to get TEE but pt's son did not want this done. As a result, pt will continue on a total of 6 weeks of IV ceftriaxone w/ end date of 03/12/22 b/c the possibility of endocarditis as per ID. PT/OT evaluated the pt and recommended SNF. For more information, please see previous progress/consult notes.    Discharge Diagnoses:  Principal Problem:   Closed right femoral fracture ([place]) Active Problems:   Hypertension   Severe sepsis ([place])   Leucocytosis   Fall at home, initial encounter   Bacteremia due to Klebsiella pneumoniae   CVA (cerebral vascular accident) ([place])   AKI (acute kidney injury) ([place])   Hypokalemia   Closed fracture of right hip ([place])   Protein-calorie malnutrition, severe   Acute deep vein thrombosis (DVT) of tibial vein of both lower extremities (H[facility])   Acute deep vein thrombosis (DVT) of left femoral vein ([facility])  Acute embolic stroke: repeat CT head on 02/06/2022 showed evolving right parietal infarct with associated petechial hemorrhage and no evidence of parenchymal hematoma. Continue on eliquis as per neuro. Echo showed EF estimated at 55 to 60%, indeterminate LV diastolic parameters, no evidence of vegetations.  [name], son, declines TEE at this time.   Dysphagia: continue on dysphagia II diet as per speech    Acute lower extremity DVT: 1 right peroneal vein and left posterior tibial vein DVT.  Repeat venous duplex of the lower extremities on 02/05/2022 showed left common femoral vein thrombosis extending up  to the saphenofemoral junction. Continue on eliquis    Severe sepsis: secondary to Klebsiella pneumoniae bacteremia, severe leukocytosis, recent fever (Tmax 102.1 F) on 01/30/2022. Continue on IV rocephin x 6 weeks total as per ID (stop date of 03/12/22).  Urine cx grows yeast. Severe sepsis resolved   Bacteremia: blood cx growing klebsiella. Pt's family did not want a TEE. Continue on IV  ceftriaxone x 6 weeks total (stop date 03/12/22) as per ID for possible endocarditis.    Right hip fracture: w/p right intramedullary nail intertrochanteric on 01/25/2022.  Outpatient follow-up with Dr. Sharlet Salina, orthopedic surg, for staple removal and x-rays.   Acute blood loss anemia: H&H are stable. Will transfuse if Hb < 7.0    Acute urinary retention: foley catheter was placed on 02/01/2022 after multiple in and out urethral catheterization. Will try voiding trial today.    Hypokalemia: WNL today   Hypomagnesemia: WNL today   AKI: resolved  Acute hypoxic respiratory failure: resolved  Discharge Instructions  Discharge Instructions     Diet - low sodium heart healthy   Complete by: As directed    Dysphagia 2 diet: fluid consistency is nectar thick. Extra gravies on meats, potatoes. Yogurt, puddings, fruits   Discharge instructions   Complete by: As directed    F/u w/ PCP in 1-2 weeks. F/u w/ ortho surg, Dr. Sharlet Salina, in 1-2 weeks. F/u w/ ID, Dr. Delaine Lame, Clinic Follow Up Appt: [place] visit 02/22/22 at 10.35AM   Home infusion instructions   Complete by: As directed    Instructions: Flushing of vascular access device: 0.9% NaCl pre/post medication administration and prn patency; Heparin 100 u/ml, 25m for implanted ports and Heparin 10u/ml, 545mfor all other central venous catheters.   Increase activity slowly   Complete by: As directed    No wound care   Complete by: As directed       Allergies as of 02/09/2022       Reactions   Sulfamethoxazole-trimethoprim Swelling   Angioedema and pruritis per 01/01/2017 documentation in care everywhere   Estradiol Other (See Comments)   Vaginal irritation Other reaction(s): Other (See Comments) Vaginal irritation   Morphine    agitation   Sertraline Hcl Other (See Comments)   Migraine headaches, severe   Morphine And Related Anxiety        Medication List     TAKE these medications    amLODipine 5 MG tablet Commonly  known as: NORVASC TAKE 1 TABLET BY MOUTH ONCE DAILY   apixaban 5 MG Tabs tablet Commonly known as: ELIQUIS Take 1 tablet (5 mg total) by mouth 2 (two) times daily.   cefTRIAXone  IVPB Commonly known as: ROCEPHIN Inject 2 g into the vein daily. Indication:  Klebsiella bacteremia with possible endocarditis Last Day of Therapy:  03/12/2022 Labs weekly on Monday while on IV antibiotics: CBC with differential, CMP Fax weekly lab results promptly to [phone] Please pull PICC at completion of IV antibiotics Method of administration: IV Push Method of administration may be changed at the discretion of home infusion pharmacist based upon assessment of the patient and/or caregiver's ability to self-administer the medication ordered.   HYDROcodone-acetaminophen 5-325 MG tablet Commonly known as: NORCO/VICODIN Take 1 tablet by mouth every 6 (six) hours as needed for up to 1 day for moderate pain or severe pain.   potassium chloride SA 20 MEQ tablet Commonly known as: KLOR-CON M TAKE 1 TABLET BY MOUTH ONCE DAILY   Vitamin D 400 units capsule Take 800  Units by mouth daily.               Home Infusion Instuctions  (From admission, onward)           Start     Ordered   02/09/22 0000  Home infusion instructions       Question:  Instructions  Answer:  Flushing of vascular access device: 0.9% NaCl pre/post medication administration and prn patency; Heparin 100 u/ml, 71m for implanted ports and Heparin 10u/ml, 566mfor all other central venous catheters.   02/09/22 1340            Contact information for follow-up providers     RaTsosie BillingMD Follow up.   Specialty: Infectious Diseases Why: Clinic Follow Up Appt: TVideo visit 02/22/22 at 10.35AM Contact information: [phone]         CrRenee HarderMD Follow up.   Specialty: Orthopedic Surgery Why: F/u in 1-2 weeks Contact information: [phone]         LeVenia CarbonMD Follow up.   Specialties: Internal Medicine, Pediatrics Why: F/u in 1-2 weeks Contact information: [phone]         PoAnabel BeneMD Follow up.   Specialty: Neurology Why: F/u in 2-3 weeks Contact information: [address] [phone]            Contact information for after-discharge care     [facility] .   Service: Skilled Nursing Contact information: [id number] [phone]                  Allergies  Allergen Reactions   Sulfamethoxazole-Trimethoprim Swelling    Angioedema and pruritis per 01/01/2017 documentation in care everywhere   Estradiol Other (See Comments)    Vaginal irritation Other reaction(s): Other (See Comments) Vaginal irritation   Morphine     agitation   Sertraline Hcl Other (See Comments)    Migraine headaches, severe   Morphine And Related Anxiety    Consultations: Ortho surg Vasc surg  ID Neuro    Procedures/Studies: DG Chest 1 View  Result Date: 02/09/2022 CLINICAL DATA:  PICC line placement EXAM: CHEST  1 VIEW COMPARISON:  01/30/2022 FINDINGS: Status post right arm PICC line placement with tip approximating the low SVC. Normal cardiac and mediastinal contours. No focal pulmonary opacity. No pleural effusion or pneumothorax. No acute osseous abnormality. IMPRESSION: Status post right arm PICC line placement with tip approximating the low SVC. No acute cardiopulmonary process. Electronically Signed   By: AlMerilyn Baba.D.   On: 02/09/2022 13:40   [place] SITE RITE  Result Date: 02/08/2022 If Site Rite image not attached, placement could not be confirmed due to current cardiac rhythm.  CT HEAD WO CONTRAST (5MM)  Result Date:  02/06/2022 CLINICAL DATA:  Stroke follow-up EXAM: CT HEAD WITHOUT CONTRAST TECHNIQUE: Contiguous axial images were obtained from the base of the skull through the vertex without intravenous contrast. RADIATION DOSE REDUCTION: This exam was performed according to the departmental dose-optimization program which includes automated exposure control, adjustment of the mA and/or kV according to patient size and/or use of iterative reconstruction technique. COMPARISON:  02/04/22 FINDINGS: Brain: Evolving right parietal infarct with associated petechial hemorrhage. No evidence of a  parenchymal hematoma. Unchanged hypodensity in the external capsule on the right. Compared to prior exam there is slight interval increase in conspicuity of the hypodensity in the posterior limb of the internal capsule on left (series 100, image 24). No hydrocephalus. No extra-axial fluid collection. Vascular: No hyperdense vessel or unexpected calcification. Skull: Normal. Negative for fracture or focal lesion. Sinuses/Orbits: Trace left mastoid effusion and mild mucosal thickening in the right sphenoid sinus. Bilateral lens replacement. Other: None. IMPRESSION: 1. Evolving right parietal infarct with associated petechial hemorrhage. No evidence of a parenchymal hematoma. 2. Possible new hypodensity in the posterior limb of the internal capsule on the left. This is nonspecific and may be artifactual, but if there is clinical concern for new infarct, further evaluation with MRI could be considered. Electronically Signed   By: Marin Roberts M.D.   On: 02/06/2022 09:31   US Venous Img Lower Unilateral Left (DVT)  Result Date: 02/06/2022 CLINICAL DATA:  Prior study demonstrating thrombus in the right peroneal vein and left posterior tibial vein. EXAM: LEFT LOWER EXTREMITY VENOUS DOPPLER ULTRASOUND TECHNIQUE: Gray-scale sonography with graded compression, as well as color Doppler and duplex ultrasound were performed to evaluate the lower  extremity deep venous systems from the level of the common femoral vein and including the common femoral, femoral, profunda femoral, popliteal and calf veins including the posterior tibial, peroneal and gastrocnemius veins when visible. The superficial great saphenous vein was also interrogated. Spectral Doppler was utilized to evaluate flow at rest and with distal augmentation maneuvers in the common femoral, femoral and popliteal veins. COMPARISON:  None Available. FINDINGS: Contralateral Common Femoral Vein: Respiratory phasicity is normal and symmetric with the symptomatic side. No evidence of thrombus. Normal compressibility. Common Femoral Vein: Nonocclusive thrombus is identified in the left common femoral vein extending up to the saphenofemoral junction but not occluding the SFJ. Saphenofemoral Junction: No evidence of thrombus. Normal compressibility and flow on color Doppler imaging. Profunda Femoral Vein: No evidence of thrombus. Normal compressibility and flow on color Doppler imaging. Femoral Vein: No evidence of thrombus. Normal compressibility, respiratory phasicity and response to augmentation. Popliteal Vein: No evidence of thrombus. Normal compressibility, respiratory phasicity and response to augmentation. Calf Veins: Peroneal vein remains normally patent. The main segment of the posterior tibial vein is now compressible but there appears to be some thrombus in a small branch vein communicating with the posterior tibial vein. Normal compressibility and flow on color Doppler imaging. Superficial Great Saphenous Vein: No evidence of thrombus. Normal compressibility. Venous Reflux:  None. Other Findings: No evidence of superficial thrombophlebitis or abnormal fluid collection. After examination of the left lower extremity, attempt was made to study the right lower extremity but the patient refused continuation of the examination due to pain in the right lower extremity from recent fracture. Only a \  left lower extremity study was able to be accomplished. IMPRESSION: 1. Nonocclusive thrombus in the left common femoral vein extending up to the saphenofemoral junction. 2. Thrombus in a small branch of the left posterior tibial vein. 3. After examination of the right lower extremity, attempt was made to study the right lower extremity but the patient refused continuation of the examination due to pain in the right lower extremity. Only a left lower extremity study was able to be accomplished. Electronically Signed   By: Aletta Edouard M.D.   On: 02/06/2022 08:06   US Carotid Bilateral  Result Date: 02/05/2022 CLINICAL DATA:  Cerebral infarction and hypertension. EXAM: BILATERAL CAROTID DUPLEX ULTRASOUND TECHNIQUE:  Gray scale imaging, color Doppler and duplex ultrasound were performed of bilateral carotid and vertebral arteries in the neck. COMPARISON:  None Available. FINDINGS: Criteria: Quantification of carotid stenosis is based on velocity parameters that correlate the residual internal carotid diameter with NASCET-based stenosis levels, using the diameter of the distal internal carotid lumen as the denominator for stenosis measurement. The following velocity measurements were obtained: RIGHT ICA:  78/23 cm/sec CCA:  37/10 cm/sec SYSTOLIC ICA/CCA RATIO:  1.0 ECA:  73 cm/sec LEFT ICA:  87/28 cm/sec CCA:  62/69 cm/sec SYSTOLIC ICA/CCA RATIO:  1.1 ECA:  61 cm/sec RIGHT CAROTID ARTERY: Mild calcified plaque at the level of the carotid bulb and proximal right ICA. Estimated right ICA stenosis is less than 50%. RIGHT VERTEBRAL ARTERY: Antegrade flow with normal waveform and velocity. LEFT CAROTID ARTERY: Mild calcified plaque at the level of the left carotid bulb and proximal left ICA. Estimated left ICA stenosis is less than 50%. LEFT VERTEBRAL ARTERY: Antegrade flow with normal waveform and velocity. IMPRESSION: Mild calcified plaque at the level of both carotid bulbs and proximal internal carotid arteries. No  significant carotid stenosis identified with estimated bilateral ICA stenoses of less than 50%. Electronically Signed   By: Aletta Edouard M.D.   On: 02/05/2022 11:32   CT HEAD WO CONTRAST (5MM)  Result Date: 02/04/2022 CLINICAL DATA:  Stroke follow-up.  Rule out hemorrhagic conversion. EXAM: CT HEAD WITHOUT CONTRAST TECHNIQUE: Contiguous axial images were obtained from the base of the skull through the vertex without intravenous contrast. RADIATION DOSE REDUCTION: This exam was performed according to the departmental dose-optimization program which includes automated exposure control, adjustment of the mA and/or kV according to patient size and/or use of iterative reconstruction technique. COMPARISON:  CT head 01/30/2022, MR head 01/30/2022 FINDINGS: Brain: Again seen is the evolving infarct in the right parietal lobe. There are small areas of hyperdensity within the infarct suspicious for tiny petechial hemorrhage common but without malignant hemorrhage hemorrhagic transformation or hematoma formation. There is no mass effect. The other acute infarcts seen on the recent brain MRI are not well seen on the current study; however, there is no evidence of hemorrhagic transformation of the other infarcts. There is no evidence of new acute territorial infarct. There is no other evidence of acute intracranial hemorrhage or extra-axial fluid collection. The ventricles are stable in size. Background chronic small-vessel ischemic change is stable. There is no mass lesion. There is no mass effect or midline shift. Vascular: There is calcification of the bilateral carotid siphons. Skull: Normal. Negative for fracture or focal lesion. Sinuses/Orbits: The imaged paranasal sinuses are clear. Bilateral lens implants are in place. The globes and orbits are otherwise unremarkable. Other: None. IMPRESSION: 1. Evolving right parietal infarct with probable tiny petechial hemorrhage but no hematoma formation or mass effect. The  other smaller infarcts seen on the recent MRI are not well seen on the current study. 2. No new acute intracranial pathology. Electronically Signed   By: Valetta Mole M.D.   On: 02/04/2022 14:05   ECHOCARDIOGRAM COMPLETE BUBBLE STUDY  Result Date: 01/31/2022    ECHOCARDIOGRAM REPORT   Patient Name:   Katie Bell Date of Exam: 01/31/2022 Medical Rec #:  [id number]        Height:       60.0 in Accession #:    [id number]       Weight:       110.0 lb Date of Birth:  08/26/1940  BSA:          1.448 m Patient Age:    48 years         BP:           117/58 mmHg Patient Gender: F                HR:           89 bpm. Exam Location:  [facility] Procedure: 2D Echo, Cardiac Doppler, Color Doppler and Saline Contrast Bubble            Study Indications:     Stroke 434.91 / I63.9  History:         Patient has no prior history of Echocardiogram examinations.                  Risk Factors:Hypertension.  Sonographer:     Sherrie Sport Referring Phys:  [id number] Sidney Ace Diagnosing Phys: Kate Sable MD IMPRESSIONS  1. Left ventricular ejection fraction, by estimation, is 55 to 60%. The left ventricle has normal function. The left ventricle has no regional wall motion abnormalities. Left ventricular diastolic parameters are indeterminate.  2. Right ventricular systolic function is normal. The right ventricular size is normal.  3. The mitral valve is normal in structure. No evidence of mitral valve regurgitation.  4. The aortic valve is tricuspid. Aortic valve regurgitation is not visualized. Aortic valve sclerosis is present, with no evidence of aortic valve stenosis.  5. The inferior vena cava is normal in size with greater than 50% respiratory variability, suggesting right atrial pressure of 3 mmHg.  6. Agitated saline contrast bubble study was negative, with no evidence of any interatrial shunt. FINDINGS  Left Ventricle: Left ventricular ejection fraction, by estimation, is 55 to 60%. The left ventricle has  normal function. The left ventricle has no regional wall motion abnormalities. The left ventricular internal cavity size was normal in size. There is  no left ventricular hypertrophy. Left ventricular diastolic parameters are indeterminate. Right Ventricle: The right ventricular size is normal. No increase in right ventricular wall thickness. Right ventricular systolic function is normal. Left Atrium: Left atrial size was normal in size. Right Atrium: Right atrial size was normal in size. Pericardium: There is no evidence of pericardial effusion. Mitral Valve: The mitral valve is normal in structure. No evidence of mitral valve regurgitation. Tricuspid Valve: The tricuspid valve is normal in structure. Tricuspid valve regurgitation is trivial. Aortic Valve: The aortic valve is tricuspid. Aortic valve regurgitation is not visualized. Aortic valve sclerosis is present, with no evidence of aortic valve stenosis. Aortic valve mean gradient measures 3.0 mmHg. Aortic valve peak gradient measures 5.0  mmHg. Aortic valve area, by VTI measures 1.87 cm. Pulmonic Valve: The pulmonic valve was not well visualized. Pulmonic valve regurgitation is not visualized. Aorta: The aortic root is normal in size and structure. Venous: The inferior vena cava is normal in size with greater than 50% respiratory variability, suggesting right atrial pressure of 3 mmHg. IAS/Shunts: The interatrial septum appears to be lipomatous. No atrial level shunt detected by color flow Doppler. Agitated saline contrast was given intravenously to evaluate for intracardiac shunting. Agitated saline contrast bubble study was negative,  with no evidence of any interatrial shunt.  LEFT VENTRICLE PLAX 2D LVIDd:         3.60 cm   Diastology LVIDs:         2.20 cm   LV e' medial:    10.70 cm/s LV PW:  1.00 cm   LV E/e' medial:  6.7 LV IVS:        1.10 cm   LV e' lateral:   10.80 cm/s LVOT diam:     2.00 cm   LV E/e' lateral: 6.6 LV SV:         36 LV SV  Index:   25 LVOT Area:     3.14 cm  LEFT ATRIUM             Index        RIGHT ATRIUM           Index LA diam:        3.60 cm 2.49 cm/m   RA Area:     12.90 cm LA Vol (A2C):   23.9 ml 16.51 ml/m  RA Volume:   31.90 ml  22.03 ml/m LA Vol (A4C):   17.1 ml 11.81 ml/m LA Biplane Vol: 21.9 ml 15.13 ml/m  AORTIC VALVE AV Area (Vmax):    1.81 cm AV Area (Vmean):   1.77 cm AV Area (VTI):     1.87 cm AV Vmax:           112.00 cm/s AV Vmean:          77.000 cm/s AV VTI:            0.192 m AV Peak Grad:      5.0 mmHg AV Mean Grad:      3.0 mmHg LVOT Vmax:         64.50 cm/s LVOT Vmean:        43.500 cm/s LVOT VTI:          0.114 m LVOT/AV VTI ratio: 0.59  AORTA Ao Root diam: 2.90 cm MITRAL VALVE                TRICUSPID VALVE MV Area (PHT): 4.86 cm     TR Peak grad:   17.6 mmHg MV Decel Time: 156 msec     TR Vmax:        210.00 cm/s MV E velocity: 71.30 cm/s MV A velocity: 126.00 cm/s  SHUNTS MV E/A ratio:  0.57         Systemic VTI:  0.11 m                             Systemic Diam: 2.00 cm Kate Sable MD Electronically signed by Kate Sable MD Signature Date/Time: 01/31/2022/5:56:50 PM    Final    US Venous Img Lower Bilateral (DVT)  Result Date: 01/31/2022 CLINICAL DATA:  Recent surgery.  Elevated D-dimer. EXAM: BILATERAL LOWER EXTREMITY VENOUS DOPPLER ULTRASOUND TECHNIQUE: Gray-scale sonography with graded compression, as well as color Doppler and duplex ultrasound were performed to evaluate the lower extremity deep venous systems from the level of the common femoral vein and including the common femoral, femoral, profunda femoral, popliteal and calf veins including the posterior tibial, peroneal and gastrocnemius veins when visible. The superficial great saphenous vein was also interrogated. Spectral Doppler was utilized to evaluate flow at rest and with distal augmentation maneuvers in the common femoral, femoral and popliteal veins. COMPARISON:  None Available. FINDINGS: RIGHT LOWER EXTREMITY  Common Femoral Vein: No evidence of thrombus. Normal compressibility, respiratory phasicity and response to augmentation. Saphenofemoral Junction: No evidence of thrombus. Normal compressibility and flow on color Doppler imaging. Profunda Femoral Vein: No evidence of thrombus. Normal compressibility and flow on color Doppler imaging. Femoral Vein:  No evidence of thrombus. Normal compressibility, respiratory phasicity and response to augmentation. Popliteal Vein: No evidence of thrombus. Normal compressibility, respiratory phasicity and response to augmentation. Calf Veins: Positive for thrombus. There is 1 peroneal vein that is not compressible and contains thrombus. The posterior tibial veins appear to be patent with normal compressibility. Other Findings:  None. LEFT LOWER EXTREMITY Common Femoral Vein: No evidence of thrombus. Normal compressibility, respiratory phasicity and response to augmentation. Saphenofemoral Junction: No evidence of thrombus. Normal compressibility and flow on color Doppler imaging. Profunda Femoral Vein: No evidence of thrombus. Normal compressibility and flow on color Doppler imaging. Femoral Vein: No evidence of thrombus. Normal compressibility, respiratory phasicity and response to augmentation. Popliteal Vein: No evidence of thrombus. Normal compressibility, respiratory phasicity and response to augmentation. Calf Veins: Positive for thrombus. There is a non compressible posterior tibial vein compatible with DVT. The second posterior tibial vein appears to be patent with normal compressibility. Left peroneal veins demonstrate normal compressibility and color Doppler flow. Other Findings:  None. IMPRESSION: 1. Positive for bilateral calf deep vein thrombosis. There is thrombus involving 1 right peroneal vein and 1 left posterior tibial vein. Electronically Signed   By: Markus Daft M.D.   On: 01/31/2022 09:45   MR ANGIO HEAD WO CONTRAST  Result Date: 01/31/2022 CLINICAL DATA:   81 year old female with neurologic deficit and patchy right greater than left hemisphere infarcts on MRI yesterday. EXAM: MRA HEAD WITHOUT CONTRAST TECHNIQUE: Angiographic images of the Circle of Willis were acquired using MRA technique without intravenous contrast. COMPARISON:  Brain MRI 01/30/2022. Neck MRA today reported separately. FINDINGS: Study is moderately degraded by motion artifact despite repeated imaging attempts. Anterior circulation: Antegrade flow in both ICA siphons, and both carotid termini appear patent, but supraclinoid ICA detail is degraded. MCA and ACA origins appear patent, but otherwise the branch detail is degraded. Posterior circulation: Antegrade flow in the posterior circulation with dominant appearing distal left vertebral artery. No distal vertebral or vertebrobasilar junction stenosis. Patent basilar artery without evidence of stenosis, although there is decreased detail of the basilar tip. PCA origins are patent. A right posterior communicating artery is visible. P1 and P2 segments appear patent but otherwise PCA branch detail is degraded. Anatomic variants: None significant are apparent. Other: No intracranial mass effect or ventriculomegaly is evident. IMPRESSION: Limited intracranial MRA. Patent ICA siphons, and no evidence of a posterior circulation large vessel occlusion. But no other circle-of-Willis branch detail. Electronically Signed   By: Genevie Ann M.D.   On: 01/31/2022 09:20   MR ANGIO NECK WO CONTRAST  Result Date: 01/31/2022 CLINICAL DATA:  81 year old female with neurologic deficit and patchy right greater than left hemisphere infarcts on MRI yesterday. EXAM: MRA NECK WITHOUT CONTRAST TECHNIQUE: Angiographic images of the neck were acquired using MRA technique without intravenous contrast. Carotid stenosis measurements (when applicable) are obtained utilizing NASCET criteria, using the distal internal carotid diameter as the denominator. COMPARISON:  Brain MRI  01/30/2022. FINDINGS: Study is moderately degraded by motion artifact despite repeated imaging attempts. Evidence of a 3 vessel arch configuration. Antegrade flow in both cervical carotid arteries throughout the neck and to the skull base. Degraded detail of both carotid bifurcations, but no carotid flow gaps to suggest high-grade stenosis. Similarly, degraded detail of both cervical vertebral arteries which demonstrate maintained antegrade flow signal to the skull base. IMPRESSION: Limited noncontrast neck MRA demonstrating patent cervical carotid and vertebral arteries. No high-grade stenosis is apparent. Electronically Signed   By: Herminio Heads.D.  On: 01/31/2022 09:11   MR BRAIN W WO CONTRAST  Result Date: 01/30/2022 CLINICAL DATA:  Neuro deficit, acute, stroke suspected. EXAM: MRI HEAD WITHOUT AND WITH CONTRAST TECHNIQUE: Multiplanar, multiecho pulse sequences of the brain and surrounding structures were obtained without and with intravenous contrast. CONTRAST:  89m GADAVIST GADOBUTROL 1 MMOL/ML IV SOLN COMPARISON:  Head CT January 31, 2019. FINDINGS: The study is significantly degraded by motion. Brain: Areas of restricted diffusion are seen in the bilateral frontoparietal regions and right temporal lobe. The largest area of restricted diffusion is in the right parietal lobe where there is also interspersed areas of vasogenic edema with sulcal effacement. These areas show prominent hyperintensity on T2/FLAIR and are suggestive of subacute infarcts. No significant mass effect or midline shift. No evidence of hemorrhagic transformation. No acute hemorrhage, hydrocephalus, extra-axial collection or mass lesion. No evidence of abnormal focus of contrast enhancement, although postcontrast images are significantly degraded by motion. Vascular: Normal flow voids. Skull and upper cervical spine: Normal marrow signal. Sinuses/Orbits: Negative. Other: None. IMPRESSION: Areas of restricted diffusion in the bilateral  frontoparietal regions and right temporal lobe consistent with subacute infarcts. No evidence of hemorrhagic transformation or significant mass effect. Findings are concerning for embolic etiology. Electronically Signed   By: KPedro EarlsM.D.   On: 01/30/2022 16:42   NM Pulmonary Perfusion  Result Date: 01/30/2022 CLINICAL DATA:  Chest pain, fall EXAM: NUCLEAR MEDICINE PERFUSION LUNG SCAN TECHNIQUE: Perfusion images were obtained in multiple projections after intravenous injection of radiopharmaceutical. Ventilation scans intentionally deferred if perfusion scan and chest x-ray adequate for interpretation during COVID 19 epidemic. RADIOPHARMACEUTICALS:  4.24 mCi Tc-946mAA IV COMPARISON:  Chest radiographs done earlier today FINDINGS: There are no discrete wedge-shaped perfusion defects. There are ill-defined areas of decreased uptake in mid and lower lung fields, more so in the left side. IMPRESSION: Low probability for pulmonary embolism. Electronically Signed   By: PaElmer Picker.D.   On: 01/30/2022 16:30   CT HEAD WO CONTRAST (5MM)  Result Date: 01/30/2022 CLINICAL DATA:  Mental status change. EXAM: CT HEAD WITHOUT CONTRAST TECHNIQUE: Contiguous axial images were obtained from the base of the skull through the vertex without intravenous contrast. RADIATION DOSE REDUCTION: This exam was performed according to the departmental dose-optimization program which includes automated exposure control, adjustment of the mA and/or kV according to patient size and/or use of iterative reconstruction technique. COMPARISON:  01/24/22 CT Brain FINDINGS: Limitations: Motion degraded exam. Brain: Compared to 01/24/2022 CT head, there is a new infarct in the right parietal lobe. No evidence of hemorrhage. No extra-axial fluid collection. No hydrocephalus. Vascular: No hyperdense vessel or unexpected calcification. Skull: Normal. Negative for fracture or focal lesion. Sinuses/Orbits: No acute  finding. Other: None. IMPRESSION: New right parietal lobe infarct. No evidence of hemorrhage. These results will be called to the ordering clinician or representative by the Radiologist Assistant, and communication documented in the PACS or ClFrontier Oil CorporationElectronically Signed   By: HeMarin Roberts.D.   On: 01/30/2022 09:51   DG Chest Port 1 View  Result Date: 01/30/2022 CLINICAL DATA:  [age]ear old female with shortness of breath. EXAM: PORTABLE CHEST 1 VIEW COMPARISON:  Portable chest 01/24/2022 and earlier. FINDINGS: Portable AP upright view at 0547 hours. Larger lung volumes. Mediastinal contours are within normal limits. Visualized tracheal air column is within normal limits. Allowing for portable technique the lungs are clear. No pneumothorax or pleural effusion. No acute osseous abnormality identified. Negative visible bowel gas. IMPRESSION: No  acute cardiopulmonary abnormality. Electronically Signed   By: Genevie Ann M.D.   On: 01/30/2022 06:56   DG HIP UNILAT WITH PELVIS 2-3 VIEWS RIGHT  Result Date: 01/25/2022 CLINICAL DATA:  Intramedullary nail of right hip. Intraoperative fluoroscopy. EXAM: DG HIP (WITH OR WITHOUT PELVIS) 2-3V RIGHT COMPARISON:  Right hip radiographs 01/24/2022 FINDINGS: Images were performed intraoperatively without the presence of a radiologist. Interval cephalomedullary nail fixation of the previously seen right intertrochanteric fracture. Improved now normal alignment. No evidence of hardware complication. Total fluoroscopy images: 2 Total fluoroscopy time: 35 seconds Total dose: Radiation Exposure Index (as provided by the fluoroscopic device): 3.189 mGy air Kerma Please see intraoperative findings for further detail. IMPRESSION: Intraoperative fluoroscopy for right intertrochanteric fracture fixation. Electronically Signed   By: Yvonne Kendall M.D.   On: 01/25/2022 18:34   DG C-Arm 1-60 Min-No Report  Result Date: 01/25/2022 Fluoroscopy was utilized by the requesting  physician.  No radiographic interpretation.   CT Head Wo Contrast  Result Date: 01/24/2022 CLINICAL DATA:  Fall EXAM: CT HEAD WITHOUT CONTRAST CT CERVICAL SPINE WITHOUT CONTRAST TECHNIQUE: Multidetector CT imaging of the head and cervical spine was performed following the standard protocol without intravenous contrast. Multiplanar CT image reconstructions of the cervical spine were also generated. RADIATION DOSE REDUCTION: This exam was performed according to the departmental dose-optimization program which includes automated exposure control, adjustment of the mA and/or kV according to patient size and/or use of iterative reconstruction technique. COMPARISON:  None Available. FINDINGS: CT HEAD FINDINGS Brain: No evidence of acute infarction, hemorrhage, hydrocephalus, extra-axial collection or mass lesion/mass effect. Subcortical white matter and periventricular small vessel ischemic changes. Vascular: Intracranial atherosclerosis. Skull: Normal. Negative for fracture or focal lesion. Sinuses/Orbits: The visualized paranasal sinuses are essentially clear. The mastoid air cells are unopacified. Other: None. CT CERVICAL SPINE FINDINGS Alignment: Normal cervical lordosis. Skull base and vertebrae: No acute fracture. No primary bone lesion or focal pathologic process. Soft tissues and spinal canal: No prevertebral fluid or swelling. No visible canal hematoma. Disc levels: Very mild degenerative changes of the mid/lower cervical spine. Spinal canal is patent. Upper chest: Visualized lung apices are clear. Other: Visualized thyroid is unremarkable. IMPRESSION: No evidence of acute intracranial abnormality. Small vessel ischemic changes. No traumatic injury to the cervical spine. Electronically Signed   By: Julian Hy M.D.   On: 01/24/2022 20:30   CT Cervical Spine Wo Contrast  Result Date: 01/24/2022 CLINICAL DATA:  Fall EXAM: CT HEAD WITHOUT CONTRAST CT CERVICAL SPINE WITHOUT CONTRAST TECHNIQUE:  Multidetector CT imaging of the head and cervical spine was performed following the standard protocol without intravenous contrast. Multiplanar CT image reconstructions of the cervical spine were also generated. RADIATION DOSE REDUCTION: This exam was performed according to the departmental dose-optimization program which includes automated exposure control, adjustment of the mA and/or kV according to patient size and/or use of iterative reconstruction technique. COMPARISON:  None Available. FINDINGS: CT HEAD FINDINGS Brain: No evidence of acute infarction, hemorrhage, hydrocephalus, extra-axial collection or mass lesion/mass effect. Subcortical white matter and periventricular small vessel ischemic changes. Vascular: Intracranial atherosclerosis. Skull: Normal. Negative for fracture or focal lesion. Sinuses/Orbits: The visualized paranasal sinuses are essentially clear. The mastoid air cells are unopacified. Other: None. CT CERVICAL SPINE FINDINGS Alignment: Normal cervical lordosis. Skull base and vertebrae: No acute fracture. No primary bone lesion or focal pathologic process. Soft tissues and spinal canal: No prevertebral fluid or swelling. No visible canal hematoma. Disc levels: Very mild degenerative changes of the mid/lower cervical  spine. Spinal canal is patent. Upper chest: Visualized lung apices are clear. Other: Visualized thyroid is unremarkable. IMPRESSION: No evidence of acute intracranial abnormality. Small vessel ischemic changes. No traumatic injury to the cervical spine. Electronically Signed   By: Julian Hy M.D.   On: 01/24/2022 20:30   DG Chest 1 View  Result Date: 01/24/2022 CLINICAL DATA:  Pt fell backwards off of first stair.Pt complaining of right hip pain and refuses any attempt to adjust for positioning. Best images obtainable EXAM: CHEST  1 VIEW COMPARISON:  12/18/2019 FINDINGS: Relatively low lung volumes. No confluent airspace infiltrate or overt edema. Heart size and  mediastinal contours are within normal limits. Aortic Atherosclerosis (ICD10-170.0). No effusion. Visualized bones unremarkable. IMPRESSION: No acute cardiopulmonary disease. Electronically Signed   By: Lucrezia Europe M.D.   On: 01/24/2022 17:01   DG Hip Unilat  With Pelvis 2-3 Views Right  Result Date: 01/24/2022 CLINICAL DATA:  Pain post fall EXAM: DG HIP (WITH OR WITHOUT PELVIS) 2-3V RIGHT COMPARISON:  CT 07/12/2020 FINDINGS: Mildly comminuted intertrochanteric fracture of the proximal right femur with varus deformity and foreshortening. No dislocation. Bony pelvis intact. Surgical clips in the left pelvis. Mild spondylitic changes in the visualized lower lumbar spine. IMPRESSION: Mildly comminuted intertrochanteric right femur fracture. Electronically Signed   By: Lucrezia Europe M.D.   On: 01/24/2022 16:50   (Echo, Carotid, EGD, Colonoscopy, ERCP)    Subjective: Pt c/o fatigue    Discharge Exam: Vitals:   02/08/22 1519 02/09/22 0857  BP: (!) 151/84 (!) 142/100  Pulse: (!) 104 97  Resp: 17 17  Temp: 98.1 F (36.7 C) 97.6 F (36.4 C)  SpO2: 95% 100%   Vitals:   02/07/22 2352 02/08/22 0745 02/08/22 1519 02/09/22 0857  BP: (!) 145/74 117/81 (!) 151/84 (!) 142/100  Pulse: 98 98 (!) 104 97  Resp: '16 16 17 17  '$ Temp: 99.2 F (37.3 C) (!) 97.4 F (36.3 C) 98.1 F (36.7 C) 97.6 F (36.4 C)  TempSrc:      SpO2: 98% 100% 95% 100%  Weight:      Height:        General: Pt is alert, awake, not in acute distress. Frail appearing  Cardiovascular: S1/S2 +, no rubs, no gallops Respiratory: CTA bilaterally, no wheezing, no rhonchi Abdominal: Soft, NT, ND, bowel sounds + Extremities: no edema, no cyanosis    The results of significant diagnostics from this hospitalization (including imaging, microbiology, ancillary and laboratory) are listed below for reference.     Microbiology: Recent Results (from the past 240 hour(s))  Culture, blood (Routine X 2) w Reflex to ID Panel     Status:  Abnormal   Collection Time: 01/30/22  3:04 PM   Specimen: BLOOD  Result Value Ref Range Status   Specimen Description   Final    BLOOD RIGHT ANTECUBITAL Performed at [facility], [address], [place], [place] [address]    Special Requests   Final    BOTTLES DRAWN AEROBIC AND ANAEROBIC Blood Culture adequate volume Performed at [facility], [address] [place], [place] [address]    Culture  Setup Time   Final    GRAM NEGATIVE RODS IN BOTH AEROBIC AND ANAEROBIC BOTTLES CRITICAL RESULT CALLED TO, READ BACK BY AND VERIFIED WITH: BRANDON BEERS @ [id number] 01/31/22 LFD CRITICAL VALUE NOTED.  VALUE IS CONSISTENT WITH PREVIOUSLY REPORTED AND CALLED VALUE.    Culture (A)  Final    KLEBSIELLA PNEUMONIAE SUSCEPTIBILITIES PERFORMED ON PREVIOUS  CULTURE WITHIN THE LAST 5 DAYS. Performed at [facility], [place] [address] [place], [place] [address]    Report Status 02/03/2022 FINAL  Final  Culture, blood (Routine X 2) w Reflex to ID Panel     Status: Abnormal   Collection Time: 01/30/22  3:04 PM   Specimen: BLOOD  Result Value Ref Range Status   Specimen Description   Final    BLOOD LEFT ANTECUBITAL Performed at [facility], [address] [place], [place] [address]    Special Requests   Final    BOTTLES DRAWN AEROBIC AND ANAEROBIC Blood Culture adequate volume Performed at [facility], [address] [place], [place] [address]    Culture  Setup Time   Final    GRAM NEGATIVE RODS IN BOTH AEROBIC AND ANAEROBIC BOTTLES Organism ID to follow CRITICAL RESULT CALLED TO, READ BACK BY AND VERIFIED WITH: BRANDON BEERS @ [id number] 01/31/22 LFD Performed at [facility], [address] [place], [place] [address]    Culture KLEBSIELLA PNEUMONIAE (A)  Final   Report Status 02/03/2022 FINAL  Final   Organism ID, Bacteria KLEBSIELLA PNEUMONIAE  Final      Susceptibility   Klebsiella pneumoniae - MIC*    AMPICILLIN >=32 RESISTANT Resistant      CEFAZOLIN <=4 SENSITIVE Sensitive     CEFEPIME <=0.12 SENSITIVE Sensitive     CEFTAZIDIME <=1 SENSITIVE Sensitive     CEFTRIAXONE <=0.25 SENSITIVE Sensitive     CIPROFLOXACIN <=0.25 SENSITIVE Sensitive     GENTAMICIN <=1 SENSITIVE Sensitive     IMIPENEM <=0.25 SENSITIVE Sensitive     TRIMETH/SULFA <=20 SENSITIVE Sensitive     AMPICILLIN/SULBACTAM 8 SENSITIVE Sensitive     PIP/TAZO <=4 SENSITIVE Sensitive     * KLEBSIELLA PNEUMONIAE  Blood Culture ID Panel (Reflexed)     Status: Abnormal   Collection Time: 01/30/22  3:04 PM  Result Value Ref Range Status   Enterococcus faecalis NOT DETECTED NOT DETECTED Final   Enterococcus Faecium NOT DETECTED NOT DETECTED Final   Listeria monocytogenes NOT DETECTED NOT DETECTED Final   Staphylococcus species NOT DETECTED NOT DETECTED Final   Staphylococcus aureus (BCID) NOT DETECTED NOT DETECTED Final   Staphylococcus epidermidis NOT DETECTED NOT DETECTED Final   Staphylococcus lugdunensis NOT DETECTED NOT DETECTED Final   Streptococcus species NOT DETECTED NOT DETECTED Final   Streptococcus agalactiae NOT DETECTED NOT DETECTED Final   Streptococcus pneumoniae NOT DETECTED NOT DETECTED Final   Streptococcus pyogenes NOT DETECTED NOT DETECTED Final   A.calcoaceticus-baumannii NOT DETECTED NOT DETECTED Final   Bacteroides fragilis NOT DETECTED NOT DETECTED Final   Enterobacterales DETECTED (A) NOT DETECTED Final    Comment: Enterobacterales represent a large order of gram negative bacteria, not a single organism. CRITICAL RESULT CALLED TO, READ BACK BY AND VERIFIED WITH: BRANDON BEERS @ [id number] 01/31/22 LFD    Enterobacter cloacae complex NOT DETECTED NOT DETECTED Final   Escherichia coli NOT DETECTED NOT DETECTED Final   Klebsiella aerogenes NOT DETECTED NOT DETECTED Final   Klebsiella oxytoca NOT DETECTED NOT DETECTED Final   Klebsiella pneumoniae DETECTED (A) NOT DETECTED Final    Comment: CRITICAL RESULT CALLED TO, READ BACK BY AND VERIFIED  WITH: BRANDON BEERS @ [id number] 01/31/22 LFD    Proteus species NOT DETECTED NOT DETECTED Final   Salmonella species NOT DETECTED NOT DETECTED Final   Serratia marcescens NOT DETECTED NOT DETECTED Final   Haemophilus influenzae NOT DETECTED NOT DETECTED Final   Neisseria meningitidis  NOT DETECTED NOT DETECTED Final   Pseudomonas aeruginosa NOT DETECTED NOT DETECTED Final   Stenotrophomonas maltophilia NOT DETECTED NOT DETECTED Final   Candida albicans NOT DETECTED NOT DETECTED Final   Candida auris NOT DETECTED NOT DETECTED Final   Candida glabrata NOT DETECTED NOT DETECTED Final   Candida krusei NOT DETECTED NOT DETECTED Final   Candida parapsilosis NOT DETECTED NOT DETECTED Final   Candida tropicalis NOT DETECTED NOT DETECTED Final   Cryptococcus neoformans/gattii NOT DETECTED NOT DETECTED Final   CTX-M ESBL NOT DETECTED NOT DETECTED Final   Carbapenem resistance IMP NOT DETECTED NOT DETECTED Final   Carbapenem resistance KPC NOT DETECTED NOT DETECTED Final   Carbapenem resistance NDM NOT DETECTED NOT DETECTED Final   Carbapenem resist OXA 48 LIKE NOT DETECTED NOT DETECTED Final   Carbapenem resistance VIM NOT DETECTED NOT DETECTED Final    Comment: Performed at [facility], [address] [place], [place] [address]  MRSA Next Gen by PCR, Nasal     Status: None   Collection Time: 01/30/22  5:26 PM   Specimen: Nasal Mucosa; Nasal Swab  Result Value Ref Range Status   MRSA by PCR Next Gen NOT DETECTED NOT DETECTED Final    Comment: (NOTE) The GeneXpert MRSA Assay (FDA approved for NASAL specimens only), is one component of a comprehensive MRSA colonization surveillance program. It is not intended to diagnose MRSA infection nor to guide or monitor treatment for MRSA infections. Test performance is not FDA approved in patients less than 30 years old. Performed at [facility], [address] [place], [place] [address]   Urine Culture     Status: Abnormal    Collection Time: 01/30/22  8:44 PM   Specimen: Urine, Clean Catch  Result Value Ref Range Status   Specimen Description   Final    URINE, CLEAN CATCH Performed at [facility], [address] [place], [place] [address]    Special Requests   Final    NONE Performed at [facility], [place], [place] [address]    Culture 50,000 COLONIES/mL YEAST (A)  Final   Report Status 02/01/2022 FINAL  Final  Culture, blood (Routine X 2) w Reflex to ID Panel     Status: None   Collection Time: 02/02/22  4:49 AM   Specimen: BLOOD  Result Value Ref Range Status   Specimen Description BLOOD LEFT WRIST  Final   Special Requests   Final    BOTTLES DRAWN AEROBIC AND ANAEROBIC Blood Culture adequate volume   Culture   Final    NO GROWTH 5 DAYS Performed at [facility], [address] [place], [place] [address]    Report Status 02/07/2022 FINAL  Final  Culture, blood (Routine X 2) w Reflex to ID Panel     Status: None   Collection Time: 02/02/22  6:23 AM   Specimen: Left Antecubital; Blood  Result Value Ref Range Status   Specimen Description LEFT ANTECUBITAL  Final   Special Requests   Final    BOTTLES DRAWN AEROBIC AND ANAEROBIC BACTEROIDES CACCAE   Culture   Final    NO GROWTH 5 DAYS Performed at [facility], [address], [place], [place] [address]    Report Status 02/07/2022 FINAL  Final     Labs: BNP (last 3 results) No results for input(s): "BNP" in the last 8760 hours. Basic Metabolic Panel: Recent Labs  Lab 02/03/22 0701 02/04/22 0446 02/05/22 0511 02/07/22  3846 02/08/22 0449 02/09/22 0640  NA 143 141 141 139 140 143  K 3.0* 3.3* 3.9 3.0* 3.9 3.7  CL 114* 115* 115* 111 116* 113*  CO2 '23 23 22 23 '$ 21* 24  GLUCOSE 139* 101* 111* 119* 104* 115*  BUN '16 12 10 8 10 11  '$ CREATININE 0.89 0.74 0.73 0.81 0.89 0.73  CALCIUM 7.9* 7.7* 8.0* 8.3* 8.2* 8.7*  MG 1.6* 2.2  --  2.0  --  2.1  PHOS 3.4 2.9  --   --   --   --     Liver Function Tests: Recent Labs  Lab 02/04/22 0446  ALBUMIN 2.3*   No results for input(s): "LIPASE", "AMYLASE" in the last 168 hours. No results for input(s): "AMMONIA" in the last 168 hours. CBC: Recent Labs  Lab 02/03/22 0701 02/04/22 0446 02/05/22 0511 02/07/22 0439 02/08/22 0449 02/09/22 0640  WBC 16.3* 19.5* 14.0* 11.5* 11.0* 14.4*  NEUTROABS 13.9* 16.6* 11.1* 9.2*  --   --   HGB 9.0* 8.6* 8.4* 9.1* 9.9* 9.9*  HCT 26.5* 25.7* 25.8* 27.7* 30.3* 30.9*  MCV 94.6 94.5 95.9 96.5 99.0 98.4  PLT 152 203 262 395 374 450*   Cardiac Enzymes: No results for input(s): "CKTOTAL", "CKMB", "CKMBINDEX", "TROPONINI" in the last 168 hours. BNP: Invalid input(s): "POCBNP" CBG: No results for input(s): "GLUCAP" in the last 168 hours. D-Dimer No results for input(s): "DDIMER" in the last 72 hours. Hgb A1c No results for input(s): "HGBA1C" in the last 72 hours. Lipid Profile No results for input(s): "CHOL", "HDL", "LDLCALC", "TRIG", "CHOLHDL", "LDLDIRECT" in the last 72 hours. Thyroid function studies No results for input(s): "TSH", "T4TOTAL", "T3FREE", "THYROIDAB" in the last 72 hours.  Invalid input(s): "FREET3" Anemia work up No results for input(s): "VITAMINB12", "FOLATE", "FERRITIN", "TIBC", "IRON", "RETICCTPCT" in the last 72 hours. Urinalysis    Component Value Date/Time   COLORURINE YELLOW (A) 01/30/2022 2322   APPEARANCEUR CLOUDY (A) 01/30/2022 2322   LABSPEC 1.018 01/30/2022 2322   PHURINE 5.0 01/30/2022 2322   GLUCOSEU NEGATIVE 01/30/2022 2322   HGBUR LARGE (A) 01/30/2022 2322   BILIRUBINUR NEGATIVE 01/30/2022 2322   BILIRUBINUR negative 12/14/2019 1159   BILIRUBINUR negative 11/18/2018 1023   KETONESUR NEGATIVE 01/30/2022 2322   PROTEINUR NEGATIVE 01/30/2022 2322   UROBILINOGEN 0.2 12/14/2019 1159   NITRITE NEGATIVE 01/30/2022 2322   LEUKOCYTESUR MODERATE (A) 01/30/2022 2322   Sepsis Labs Recent Labs  Lab 02/05/22 0511 02/07/22 0439 02/08/22 0449  02/09/22 0640  WBC 14.0* 11.5* 11.0* 14.4*   Microbiology Recent Results (from the past 240 hour(s))  Culture, blood (Routine X 2) w Reflex to ID Panel     Status: Abnormal   Collection Time: 01/30/22  3:04 PM   Specimen: BLOOD  Result Value Ref Range Status   Specimen Description   Final    BLOOD RIGHT ANTECUBITAL Performed at [facility], [address] [place], [place] [address]    Special Requests   Final    BOTTLES DRAWN AEROBIC AND ANAEROBIC Blood Culture adequate volume Performed at [facility], [address] [place], [place] [address]    Culture  Setup Time   Final    GRAM NEGATIVE RODS IN BOTH AEROBIC AND ANAEROBIC BOTTLES CRITICAL RESULT CALLED TO, READ BACK BY AND VERIFIED WITH: BRANDON BEERS @ 7939 01/31/22 LFD CRITICAL VALUE NOTED.  VALUE IS CONSISTENT WITH PREVIOUSLY REPORTED AND CALLED VALUE.    Culture (A)  Final    KLEBSIELLA PNEUMONIAE SUSCEPTIBILITIES PERFORMED ON  PREVIOUS CULTURE WITHIN THE LAST 5 DAYS. Performed at [facility], [place] [address], [place], [place] [address]    Report Status 02/03/2022 FINAL  Final  Culture, blood (Routine X 2) w Reflex to ID Panel     Status: Abnormal   Collection Time: 01/30/22  3:04 PM   Specimen: BLOOD  Result Value Ref Range Status   Specimen Description   Final    BLOOD LEFT ANTECUBITAL Performed at [facility], [address] [place], [place] [address]    Special Requests   Final    BOTTLES DRAWN AEROBIC AND ANAEROBIC Blood Culture adequate volume Performed at [facility], [address] [place], [place] [address]    Culture  Setup Time   Final    GRAM NEGATIVE RODS IN BOTH AEROBIC AND ANAEROBIC BOTTLES Organism ID to follow CRITICAL RESULT CALLED TO, READ BACK BY AND VERIFIED WITH: BRANDON BEERS @ 1287 01/31/22 LFD Performed at [facility], [address] [place], [place] [address]    Culture KLEBSIELLA PNEUMONIAE (A)  Final   Report Status  02/03/2022 FINAL  Final   Organism ID, Bacteria KLEBSIELLA PNEUMONIAE  Final      Susceptibility   Klebsiella pneumoniae - MIC*    AMPICILLIN >=32 RESISTANT Resistant     CEFAZOLIN <=4 SENSITIVE Sensitive     CEFEPIME <=0.12 SENSITIVE Sensitive     CEFTAZIDIME <=1 SENSITIVE Sensitive     CEFTRIAXONE <=0.25 SENSITIVE Sensitive     CIPROFLOXACIN <=0.25 SENSITIVE Sensitive     GENTAMICIN <=1 SENSITIVE Sensitive     IMIPENEM <=0.25 SENSITIVE Sensitive     TRIMETH/SULFA <=20 SENSITIVE Sensitive     AMPICILLIN/SULBACTAM 8 SENSITIVE Sensitive     PIP/TAZO <=4 SENSITIVE Sensitive     * KLEBSIELLA PNEUMONIAE  Blood Culture ID Panel (Reflexed)     Status: Abnormal   Collection Time: 01/30/22  3:04 PM  Result Value Ref Range Status   Enterococcus faecalis NOT DETECTED NOT DETECTED Final   Enterococcus Faecium NOT DETECTED NOT DETECTED Final   Listeria monocytogenes NOT DETECTED NOT DETECTED Final   Staphylococcus species NOT DETECTED NOT DETECTED Final   Staphylococcus aureus (BCID) NOT DETECTED NOT DETECTED Final   Staphylococcus epidermidis NOT DETECTED NOT DETECTED Final   Staphylococcus lugdunensis NOT DETECTED NOT DETECTED Final   Streptococcus species NOT DETECTED NOT DETECTED Final   Streptococcus agalactiae NOT DETECTED NOT DETECTED Final   Streptococcus pneumoniae NOT DETECTED NOT DETECTED Final   Streptococcus pyogenes NOT DETECTED NOT DETECTED Final   A.calcoaceticus-baumannii NOT DETECTED NOT DETECTED Final   Bacteroides fragilis NOT DETECTED NOT DETECTED Final   Enterobacterales DETECTED (A) NOT DETECTED Final    Comment: Enterobacterales represent a large order of gram negative bacteria, not a single organism. CRITICAL RESULT CALLED TO, READ BACK BY AND VERIFIED WITH: BRANDON BEERS @ 2094 01/31/22 LFD    Enterobacter cloacae complex NOT DETECTED NOT DETECTED Final   Escherichia coli NOT DETECTED NOT DETECTED Final   Klebsiella aerogenes NOT DETECTED NOT DETECTED Final    Klebsiella oxytoca NOT DETECTED NOT DETECTED Final   Klebsiella pneumoniae DETECTED (A) NOT DETECTED Final    Comment: CRITICAL RESULT CALLED TO, READ BACK BY AND VERIFIED WITH: BRANDON BEERS @ 7096 01/31/22 LFD    Proteus species NOT DETECTED NOT DETECTED Final   Salmonella species NOT DETECTED NOT DETECTED Final   Serratia marcescens NOT DETECTED NOT DETECTED Final   Haemophilus influenzae NOT DETECTED NOT DETECTED Final   Neisseria  meningitidis NOT DETECTED NOT DETECTED Final   Pseudomonas aeruginosa NOT DETECTED NOT DETECTED Final   Stenotrophomonas maltophilia NOT DETECTED NOT DETECTED Final   Candida albicans NOT DETECTED NOT DETECTED Final   Candida auris NOT DETECTED NOT DETECTED Final   Candida glabrata NOT DETECTED NOT DETECTED Final   Candida krusei NOT DETECTED NOT DETECTED Final   Candida parapsilosis NOT DETECTED NOT DETECTED Final   Candida tropicalis NOT DETECTED NOT DETECTED Final   Cryptococcus neoformans/gattii NOT DETECTED NOT DETECTED Final   CTX-M ESBL NOT DETECTED NOT DETECTED Final   Carbapenem resistance IMP NOT DETECTED NOT DETECTED Final   Carbapenem resistance KPC NOT DETECTED NOT DETECTED Final   Carbapenem resistance NDM NOT DETECTED NOT DETECTED Final   Carbapenem resist OXA 48 LIKE NOT DETECTED NOT DETECTED Final   Carbapenem resistance VIM NOT DETECTED NOT DETECTED Final    Comment: Performed at [facility], [address] [place], [place] [address]  MRSA Next Gen by PCR, Nasal     Status: None   Collection Time: 01/30/22  5:26 PM   Specimen: Nasal Mucosa; Nasal Swab  Result Value Ref Range Status   MRSA by PCR Next Gen NOT DETECTED NOT DETECTED Final    Comment: (NOTE) The GeneXpert MRSA Assay (FDA approved for NASAL specimens only), is one component of a comprehensive MRSA colonization surveillance program. It is not intended to diagnose MRSA infection nor to guide or monitor treatment for MRSA infections. Test performance is not FDA  approved in patients less than 27 years old. Performed at [facility], [address] [place], [place] [address]   Urine Culture     Status: Abnormal   Collection Time: 01/30/22  8:44 PM   Specimen: Urine, Clean Catch  Result Value Ref Range Status   Specimen Description   Final    URINE, CLEAN CATCH Performed at [facility], [address] [place], [place] [address]    Special Requests   Final    NONE Performed at [facility], [place], [place] [address]    Culture 50,000 COLONIES/mL YEAST (A)  Final   Report Status 02/01/2022 FINAL  Final  Culture, blood (Routine X 2) w Reflex to ID Panel     Status: None   Collection Time: 02/02/22  4:49 AM   Specimen: BLOOD  Result Value Ref Range Status   Specimen Description BLOOD LEFT WRIST  Final   Special Requests   Final    BOTTLES DRAWN AEROBIC AND ANAEROBIC Blood Culture adequate volume   Culture   Final    NO GROWTH 5 DAYS Performed at [facility], [address], [place], [place] [address]    Report Status 02/07/2022 FINAL  Final  Culture, blood (Routine X 2) w Reflex to ID Panel     Status: None   Collection Time: 02/02/22  6:23 AM   Specimen: Left Antecubital; Blood  Result Value Ref Range Status   Specimen Description LEFT ANTECUBITAL  Final   Special Requests   Final    BOTTLES DRAWN AEROBIC AND ANAEROBIC BACTEROIDES CACCAE   Culture   Final    NO GROWTH 5 DAYS Performed at [facility], [address] [place],  [address]    Report Status 02/07/2022 FINAL  Final     Time coordinating discharge: Over 30 minutes  SIGNED:   Wyvonnia Dusky, MD  [facility] 02/09/2022, 1:54 PM Pager   If 7PM-7AM, please contact night-coverage  [email or web address]

## 2022-02-09 NOTE — Progress Notes (Signed)
PT Cancellation Note  Patient Details Name: Katie Bell MRN: [id number] DOB: 1940/06/22   Cancelled Treatment:    Reason Eval/Treat Not Completed: Fatigue/lethargy limiting ability to participate (Chart reviewed, treatment attempted. Pt sleeping heavily on arrival, awakens to voice briefly for intervals but remains more slurred and confused. Pt assisted with scooting up in bed and given a 2 pillows for support and blanket as requested.) Will attempt treatment again at later date time. As pt is now 2 weeks out from her ORIF, I will update the frequency of our services per protocol.   11:08 AM, 02/09/22 Etta Grandchild, PT, DPT Physical Therapist - [facility]  [phone] ([place])    Secilia Apps C 02/09/2022, 11:06 AM

## 2022-02-09 NOTE — Plan of Care (Signed)

## 2022-02-09 NOTE — Progress Notes (Signed)
Peripherally Inserted Central Catheter Placement  The IV Nurse has discussed with the patient and/or persons authorized to consent for the patient, the purpose of this procedure and the potential benefits and risks involved with this procedure.  The benefits include less needle sticks, lab draws from the catheter, and the patient may be discharged home with the catheter. Risks include, but not limited to, infection, bleeding, blood clot (thrombus formation), and puncture of an artery; nerve damage and irregular heartbeat and possibility to perform a PICC exchange if needed/ordered by physician.  Alternatives to this procedure were also discussed.  Bard Power PICC patient education guide, fact sheet on infection prevention and patient information card has been provided to patient /or left at bedside.    PICC Placement Documentation  PICC Single Lumen 64/68/03 Right Basilic 37 cm 0 cm (Active)  Indication for Insertion or Continuance of Line Home intravenous therapies (PICC only) 02/09/22 1300  Exposed Catheter (cm) 0 cm 02/09/22 1300  Site Assessment Clean, Dry, Intact 02/09/22 1300  Line Status Flushed 02/09/22 1300  Dressing Type Transparent 02/09/22 1300  Dressing Status Antimicrobial disc in place 02/09/22 1300  Safety Lock Not Applicable 21/22/48 2500  Dressing Intervention New dressing 02/09/22 1300  Dressing Change Due 02/16/22 02/09/22 1300     Telephone consent with [name] (son)  Catalina Pizza 02/09/2022, 1:20 PM

## 2022-02-09 NOTE — TOC Progression Note (Signed)
Transition of Care [facility]) - Progression Note    Patient Details  Name: Katie Bell MRN: [id number] Date of Birth: 1940/10/11  Transition of Care [facility]) CM/SW [place], RN Phone Number: 02/09/2022, 1:42 PM  Clinical Narrative:    Called the son [name] and let him know we will DC the patient to [facility] today He stated understanding and agreed EMS will transport   Expected Discharge Plan: [place] Barriers to Discharge: Insurance Authorization  Expected Discharge Plan and Services Expected Discharge Plan: [place] In-house Referral: Clinical Social Work       Expected Discharge Date: 02/09/22                                     Social Determinants of Health (SDOH) Interventions Food Insecurity Interventions: Intervention Not Indicated Housing Interventions: Intervention Not Indicated  Readmission Risk Interventions     No data to display

## 2022-02-12 DIAGNOSIS — R5381 Other malaise: Secondary | ICD-10-CM | POA: Diagnosis not present

## 2022-02-13 ENCOUNTER — Emergency Department: Payer: Medicare HMO

## 2022-02-13 ENCOUNTER — Inpatient Hospital Stay
Admission: EM | Admit: 2022-02-13 | Discharge: 2022-02-20 | DRG: 640 | Disposition: A | Discharge status: Skilled Nursing Facility | Payer: Medicare HMO | Source: Skilled Nursing Facility | Attending: Hospitalist | Admitting: Hospitalist

## 2022-02-13 ENCOUNTER — Other Ambulatory Visit: Payer: Self-pay

## 2022-02-13 DIAGNOSIS — G9341 Metabolic encephalopathy: Secondary | ICD-10-CM | POA: Diagnosis present

## 2022-02-13 DIAGNOSIS — M25551 Pain in right hip: Secondary | ICD-10-CM | POA: Diagnosis not present

## 2022-02-13 DIAGNOSIS — Z681 Body mass index (BMI) 19 or less, adult: Secondary | ICD-10-CM | POA: Diagnosis not present

## 2022-02-13 DIAGNOSIS — R8271 Bacteriuria: Secondary | ICD-10-CM

## 2022-02-13 DIAGNOSIS — Z85828 Personal history of other malignant neoplasm of skin: Secondary | ICD-10-CM

## 2022-02-13 DIAGNOSIS — R000 Tachycardia, unspecified: Secondary | ICD-10-CM

## 2022-02-13 DIAGNOSIS — B961 Klebsiella pneumoniae [K. pneumoniae] as the cause of diseases classified elsewhere: Secondary | ICD-10-CM | POA: Diagnosis not present

## 2022-02-13 DIAGNOSIS — E860 Dehydration: Secondary | ICD-10-CM | POA: Diagnosis not present

## 2022-02-13 DIAGNOSIS — Z8673 Personal history of transient ischemic attack (TIA), and cerebral infarction without residual deficits: Secondary | ICD-10-CM

## 2022-02-13 DIAGNOSIS — S7291XA Unspecified fracture of right femur, initial encounter for closed fracture: Secondary | ICD-10-CM | POA: Diagnosis not present

## 2022-02-13 DIAGNOSIS — I82443 Acute embolism and thrombosis of tibial vein, bilateral: Secondary | ICD-10-CM | POA: Diagnosis present

## 2022-02-13 DIAGNOSIS — R2689 Other abnormalities of gait and mobility: Secondary | ICD-10-CM | POA: Diagnosis not present

## 2022-02-13 DIAGNOSIS — R4182 Altered mental status, unspecified: Secondary | ICD-10-CM | POA: Diagnosis not present

## 2022-02-13 DIAGNOSIS — N281 Cyst of kidney, acquired: Secondary | ICD-10-CM | POA: Diagnosis not present

## 2022-02-13 DIAGNOSIS — R41841 Cognitive communication deficit: Secondary | ICD-10-CM | POA: Diagnosis not present

## 2022-02-13 DIAGNOSIS — D72829 Elevated white blood cell count, unspecified: Secondary | ICD-10-CM | POA: Diagnosis present

## 2022-02-13 DIAGNOSIS — M19012 Primary osteoarthritis, left shoulder: Secondary | ICD-10-CM | POA: Diagnosis not present

## 2022-02-13 DIAGNOSIS — N179 Acute kidney failure, unspecified: Secondary | ICD-10-CM | POA: Diagnosis not present

## 2022-02-13 DIAGNOSIS — L22 Diaper dermatitis: Secondary | ICD-10-CM | POA: Diagnosis not present

## 2022-02-13 DIAGNOSIS — E43 Unspecified severe protein-calorie malnutrition: Secondary | ICD-10-CM | POA: Diagnosis not present

## 2022-02-13 DIAGNOSIS — R338 Other retention of urine: Secondary | ICD-10-CM

## 2022-02-13 DIAGNOSIS — R7881 Bacteremia: Secondary | ICD-10-CM | POA: Diagnosis present

## 2022-02-13 DIAGNOSIS — R5383 Other fatigue: Secondary | ICD-10-CM | POA: Diagnosis not present

## 2022-02-13 DIAGNOSIS — Z85820 Personal history of malignant melanoma of skin: Secondary | ICD-10-CM

## 2022-02-13 DIAGNOSIS — R339 Retention of urine, unspecified: Secondary | ICD-10-CM | POA: Diagnosis present

## 2022-02-13 DIAGNOSIS — Z86718 Personal history of other venous thrombosis and embolism: Secondary | ICD-10-CM

## 2022-02-13 DIAGNOSIS — Z9049 Acquired absence of other specified parts of digestive tract: Secondary | ICD-10-CM

## 2022-02-13 DIAGNOSIS — Z7401 Bed confinement status: Secondary | ICD-10-CM | POA: Diagnosis not present

## 2022-02-13 DIAGNOSIS — R278 Other lack of coordination: Secondary | ICD-10-CM | POA: Diagnosis not present

## 2022-02-13 DIAGNOSIS — Z825 Family history of asthma and other chronic lower respiratory diseases: Secondary | ICD-10-CM

## 2022-02-13 DIAGNOSIS — S72001D Fracture of unspecified part of neck of right femur, subsequent encounter for closed fracture with routine healing: Secondary | ICD-10-CM | POA: Diagnosis not present

## 2022-02-13 DIAGNOSIS — E861 Hypovolemia: Secondary | ICD-10-CM | POA: Diagnosis present

## 2022-02-13 DIAGNOSIS — G9340 Encephalopathy, unspecified: Secondary | ICD-10-CM

## 2022-02-13 DIAGNOSIS — I10 Essential (primary) hypertension: Secondary | ICD-10-CM | POA: Diagnosis present

## 2022-02-13 DIAGNOSIS — Z87891 Personal history of nicotine dependence: Secondary | ICD-10-CM | POA: Diagnosis not present

## 2022-02-13 DIAGNOSIS — Z811 Family history of alcohol abuse and dependence: Secondary | ICD-10-CM

## 2022-02-13 DIAGNOSIS — N3000 Acute cystitis without hematuria: Secondary | ICD-10-CM | POA: Diagnosis not present

## 2022-02-13 DIAGNOSIS — D72823 Leukemoid reaction: Secondary | ICD-10-CM

## 2022-02-13 DIAGNOSIS — R1312 Dysphagia, oropharyngeal phase: Secondary | ICD-10-CM | POA: Diagnosis not present

## 2022-02-13 DIAGNOSIS — Z7901 Long term (current) use of anticoagulants: Secondary | ICD-10-CM

## 2022-02-13 DIAGNOSIS — Z79899 Other long term (current) drug therapy: Secondary | ICD-10-CM

## 2022-02-13 DIAGNOSIS — E870 Hyperosmolality and hypernatremia: Principal | ICD-10-CM

## 2022-02-13 DIAGNOSIS — Z452 Encounter for adjustment and management of vascular access device: Secondary | ICD-10-CM | POA: Diagnosis not present

## 2022-02-13 DIAGNOSIS — R102 Pelvic and perineal pain: Secondary | ICD-10-CM | POA: Diagnosis not present

## 2022-02-13 DIAGNOSIS — K219 Gastro-esophageal reflux disease without esophagitis: Secondary | ICD-10-CM | POA: Diagnosis present

## 2022-02-13 DIAGNOSIS — I700 Atherosclerosis of aorta: Secondary | ICD-10-CM | POA: Diagnosis not present

## 2022-02-13 DIAGNOSIS — J150 Pneumonia due to Klebsiella pneumoniae: Secondary | ICD-10-CM | POA: Diagnosis not present

## 2022-02-13 DIAGNOSIS — M6281 Muscle weakness (generalized): Secondary | ICD-10-CM | POA: Diagnosis not present

## 2022-02-13 DIAGNOSIS — M19011 Primary osteoarthritis, right shoulder: Secondary | ICD-10-CM | POA: Diagnosis not present

## 2022-02-13 DIAGNOSIS — R404 Transient alteration of awareness: Secondary | ICD-10-CM | POA: Diagnosis not present

## 2022-02-13 LAB — CBC WITH DIFFERENTIAL/PLATELET
Abs Immature Granulocytes: 0.08 10*3/uL — ABNORMAL HIGH (ref 0.00–0.07)
Basophils Absolute: 0.1 10*3/uL (ref 0.0–0.1)
Basophils Relative: 1 %
Eosinophils Absolute: 0.1 10*3/uL (ref 0.0–0.5)
Eosinophils Relative: 0 %
HCT: 36.7 % (ref 36.0–46.0)
Hemoglobin: 11.2 g/dL — ABNORMAL LOW (ref 12.0–15.0)
Immature Granulocytes: 1 %
Lymphocytes Relative: 10 %
Lymphs Abs: 1.3 10*3/uL (ref 0.7–4.0)
MCH: 31 pg (ref 26.0–34.0)
MCHC: 30.5 g/dL (ref 30.0–36.0)
MCV: 101.7 fL — ABNORMAL HIGH (ref 80.0–100.0)
Monocytes Absolute: 0.9 10*3/uL (ref 0.1–1.0)
Monocytes Relative: 7 %
Neutro Abs: 11 10*3/uL — ABNORMAL HIGH (ref 1.7–7.7)
Neutrophils Relative %: 81 %
Platelets: 517 10*3/uL — ABNORMAL HIGH (ref 150–400)
RBC: 3.61 MIL/uL — ABNORMAL LOW (ref 3.87–5.11)
RDW: 14.3 % (ref 11.5–15.5)
WBC: 13.5 10*3/uL — ABNORMAL HIGH (ref 4.0–10.5)
nRBC: 0 % (ref 0.0–0.2)

## 2022-02-13 LAB — URINALYSIS, COMPLETE (UACMP) WITH MICROSCOPIC
Bilirubin Urine: NEGATIVE
Glucose, UA: NEGATIVE mg/dL
Hgb urine dipstick: NEGATIVE
Ketones, ur: 5 mg/dL — AB
Nitrite: NEGATIVE
Protein, ur: 30 mg/dL — AB
Specific Gravity, Urine: 1.016 (ref 1.005–1.030)
Squamous Epithelial / HPF: NONE SEEN (ref 0–5)
WBC, UA: >50 WBC/hpf — ABNORMAL HIGH (ref 0–5)
pH: 5 (ref 5.0–8.0)

## 2022-02-13 LAB — LACTIC ACID, PLASMA
Lactic Acid, Venous: 1.8 mmol/L (ref 0.5–1.9)
Lactic Acid, Venous: 1.4 mmol/L (ref 0.5–1.9)

## 2022-02-13 LAB — COMPREHENSIVE METABOLIC PANEL
ALT: 18 U/L (ref 0–44)
AST: 24 U/L (ref 15–41)
Albumin: 3.3 g/dL — ABNORMAL LOW (ref 3.5–5.0)
Alkaline Phosphatase: 102 U/L (ref 38–126)
Anion gap: 12 (ref 5–15)
BUN: 36 mg/dL — ABNORMAL HIGH (ref 8–23)
CO2: 19 mmol/L — ABNORMAL LOW (ref 22–32)
Calcium: 9.5 mg/dL (ref 8.9–10.3)
Chloride: 119 mmol/L — ABNORMAL HIGH (ref 98–111)
Creatinine, Ser: 1.43 mg/dL — ABNORMAL HIGH (ref 0.44–1.00)
GFR, Estimated: 37 mL/min — ABNORMAL LOW (ref >60)
Glucose, Bld: 130 mg/dL — ABNORMAL HIGH (ref 70–99)
Potassium: 4.2 mmol/L (ref 3.5–5.1)
Sodium: 150 mmol/L — ABNORMAL HIGH (ref 135–145)
Total Bilirubin: 1 mg/dL (ref 0.3–1.2)
Total Protein: 7.5 g/dL (ref 6.5–8.1)

## 2022-02-13 LAB — TROPONIN I (HIGH SENSITIVITY): Troponin I (High Sensitivity): 15 ng/L (ref <18)

## 2022-02-13 LAB — PROTIME-INR
INR: 1.7 — ABNORMAL HIGH (ref 0.8–1.2)
Prothrombin Time: 20.1 seconds — ABNORMAL HIGH (ref 11.4–15.2)

## 2022-02-13 LAB — PROCALCITONIN: Procalcitonin: <0.1 ng/mL

## 2022-02-13 LAB — APTT: aPTT: 35 seconds (ref 24–36)

## 2022-02-13 MED ORDER — SODIUM CHLORIDE 0.9% FLUSH
3.0000 mL | Freq: Two times a day (BID) | INTRAVENOUS | Status: DC
  Administered 2022-02-14 – 2022-02-20 (×14): 3 mL via INTRAVENOUS

## 2022-02-13 MED ORDER — AMLODIPINE BESYLATE 5 MG PO TABS
5.0000 mg | ORAL_TABLET | Freq: Every day | ORAL | Status: DC
  Administered 2022-02-16 – 2022-02-20 (×5): 5 mg via ORAL
  Filled 2022-02-13 (×5): qty 1

## 2022-02-13 MED ORDER — ACETAMINOPHEN 325 MG PO TABS
650.0000 mg | ORAL_TABLET | Freq: Four times a day (QID) | ORAL | Status: DC | PRN
  Administered 2022-02-17 – 2022-02-20 (×2): 650 mg via ORAL
  Filled 2022-02-13 (×2): qty 2

## 2022-02-13 MED ORDER — LACTATED RINGERS IV SOLN: INTRAVENOUS | Status: DC

## 2022-02-13 MED ORDER — ACETAMINOPHEN 650 MG RE SUPP: 650.0000 mg | Freq: Four times a day (QID) | RECTAL | Status: DC | PRN

## 2022-02-13 MED ORDER — FOLIC ACID 5 MG/ML IJ SOLN
1.0000 mg | Freq: Every day | INTRAMUSCULAR | Status: DC
  Administered 2022-02-14 – 2022-02-15 (×3): 1 mg via INTRAVENOUS
  Filled 2022-02-13 (×4): qty 0.2

## 2022-02-13 MED ORDER — APIXABAN 5 MG PO TABS: 5.0000 mg | ORAL_TABLET | Freq: Two times a day (BID) | ORAL | Status: DC

## 2022-02-13 MED ORDER — THIAMINE HCL 100 MG/ML IJ SOLN
100.0000 mg | Freq: Every day | INTRAMUSCULAR | Status: DC
  Administered 2022-02-14 – 2022-02-15 (×2): 100 mg via INTRAVENOUS
  Filled 2022-02-13 (×2): qty 2

## 2022-02-13 MED ORDER — LACTATED RINGERS IV BOLUS
500.0000 mL | Freq: Once | INTRAVENOUS | Status: AC
  Administered 2022-02-13: 500 mL via INTRAVENOUS

## 2022-02-13 MED ORDER — SODIUM CHLORIDE 0.9 % IV SOLN
1.0000 g | INTRAVENOUS | Status: AC
  Administered 2022-02-13: 1 g via INTRAVENOUS
  Filled 2022-02-13: qty 10

## 2022-02-13 MED ORDER — POLYETHYLENE GLYCOL 3350 17 G PO PACK: 17.0000 g | PACK | Freq: Every day | ORAL | Status: DC | PRN

## 2022-02-13 MED ORDER — ENOXAPARIN SODIUM 40 MG/0.4ML IJ SOSY: 40.0000 mg | PREFILLED_SYRINGE | INTRAMUSCULAR | Status: DC

## 2022-02-13 NOTE — Assessment & Plan Note (Signed)
Recent hospitalization during which patient experienced urinary retention with Foley removal on day of discharge.  Presenting with approximately 1.2 L of output on catheter insertion.   Plan: --cont Foley for now

## 2022-02-13 NOTE — Assessment & Plan Note (Addendum)
--  Cr 1.43 on presentation.  Baseline around 0.8.  Secondary to urinary retention and dehydration. --s/p IVF, Cr back to baseline

## 2022-02-13 NOTE — Assessment & Plan Note (Addendum)
Resolved the next day.

## 2022-02-13 NOTE — H&P (Signed)
History and Physical    Patient: Katie Bell EYC:[id number] DOB: 1940/06/14 DOA: 02/13/2022 DOS: the patient was seen and examined on 02/13/2022 PCP: Venia Carbon, MD  Patient coming from: SNF  Chief Complaint:  Chief Complaint  Patient presents with   Altered Mental Status    Change in baseline, no known hx of dementia per ems   HPI: Katie Bell is a 81 y.o. female with medical history significant of hypertension, anxiety, previous nephrolithiasis and recent mechanical fall complicated by right intertrochanteric fracture s/p surgical intervention who presents to the ED with altered mental status.  Given patient's AMS, history obtained through chart review discussing with patient's son, Katie Bell.  Recently admitted from 10/05 - 10/20 after a mechanical fall that was complicated by right intertrochanteric fracture s/p surgical repair.  Hospital stay was complicated by Klebsiella pneumonia bacteremia, acute lower extremity DVT, acute embolic stroke and acute urinary retention.  She was discharged to SNF with plan to complete 6 weeks of IV ceftriaxone.  Per EMS report, SNF staff concerned for altered mental status and possible underlying infection of surgical staples.  I was unable to reach patient's SNF.  I discussed with patient's son Katie Bell who states patient has been doing well the last couple days and was even able to walk some yesterday.  Katie Bell has been complaining that she is not getting enough water at the facility and Katie Bell has noticed that her appetite has gone down.  He has not been told that she has had any fevers or chills.  ED Course:  On arrival to the ED, patient was afebrile at 98.5, normotensive at 126/95 and tachycardic up to 112. She was saturating at 98% on room air. Initial work up remarkable for sodium of 150, bicarb of 19, BUN 36, creatinine 1.43, WBC 13.5 and hemoglobin 11.2. In/out catheterization for urinalysis with 1236m output; UA results concerning for  UTI per EDP. ID consulted by EDP. TRH contacted for admission for acute encephalopathy.   Review of Systems: unable to review all systems due to the inability of the patient to answer questions.  Past Medical History:  Diagnosis Date   Anxiety    Cancer ([place] 2015   basal cell removed from leg   Depression    Episodic mood disorder ([place]    mostly anxiety--depression in the past   Gastric ulcer    History   GERD (gastroesophageal reflux disease)    [facility] blood [facility]   History of hiatal hernia    History of kidney stones 1960   Hypertension    Malignant melanoma ([place] 07/14/2014   Left proximal post lat thigh. Breslow's 0.239m Anatomic level II. Excised 08/03/2014   Osteoarthritis, hand    hand   Postmenopausal disorder    SVD (spontaneous vaginal delivery)    x 3   Past Surgical History:  Procedure Laterality Date   CATARACT EXTRACTION W/PHACO Right 02/12/2019   Procedure: CATARACT EXTRACTION PHACO AND INTRAOCULAR LENS PLACEMENT (IOReserve  Surgeon: HaMarchia MeiersMD;  Location: [facility] ORS;  Service: Ophthalmology;  Laterality: Right;  [place]:34.2 CDE 17.97 Fluid Pack lot # [id number]   CATARACT EXTRACTION W/PHACO Left 03/26/2019   Procedure: CATARACT EXTRACTION PHACO AND INTRAOCULAR LENS PLACEMENT (IOGuthrieLEFT VISION BLUE;  Surgeon: HaMarchia MeiersMD;  Location: [facility] ORS;  Service: Ophthalmology;  Laterality: Left;  [place]:16.2 CDE 11.70 FLUID PACK LOT # [id number]   CHOLECYSTECTOMY  2008  COLONOSCOPY  04/2011   CYSTOCELE REPAIR N/A 12/31/2013   Procedure: ANTERIOR REPAIR (CYSTOCELE);  Surgeon: Donnamae Jude, MD;  Location: [facility];  Service: Gynecology;  Laterality: N/A;   CYSTOSCOPY W/ URETERAL STENT PLACEMENT Left 01/16/2016   Procedure: CYSTOSCOPY WITH STENT REPLACEMENT;  Surgeon: Hollice Espy, MD;  Location: [facility] ORS;  Service: Urology;  Laterality: Left;   CYSTOSCOPY WITH STENT PLACEMENT Left 12/23/2015   Procedure:  CYSTOSCOPY WITH STENT PLACEMENT;  Surgeon: Ardis Hughs, MD;  Location: [facility] ORS;  Service: Urology;  Laterality: Left;   DIAGNOSTIC LAPAROSCOPY     EYE SURGERY     INTRAMEDULLARY (IM) NAIL INTERTROCHANTERIC Right 01/25/2022   Procedure: INTRAMEDULLARY (IM) NAIL INTERTROCHANTERIC;  Surgeon: Renee Harder, MD;  Location: [facility] ORS;  Service: Orthopedics;  Laterality: Right;   kidney stone removed     MELANOMA EXCISION  2015   left posterior thigh   TONSILLECTOMY  1944   TUBAL LIGATION     UPPER GI ENDOSCOPY     URETEROSCOPY WITH HOLMIUM LASER LITHOTRIPSY Left 01/16/2016   Procedure: URETEROSCOPY WITH HOLMIUM LASER LITHOTRIPSY;  Surgeon: Hollice Espy, MD;  Location: [facility] ORS;  Service: Urology;  Laterality: Left;   WISDOM TOOTH EXTRACTION     Social History:  reports that she has quit smoking. She has never used smokeless tobacco. She reports that she does not drink alcohol and does not use drugs.  Allergies  Allergen Reactions   Sulfamethoxazole-Trimethoprim Swelling    Angioedema and pruritis per 01/01/2017 documentation in care everywhere   Estradiol Other (See Comments)    Vaginal irritation Other reaction(s): Other (See Comments) Vaginal irritation   Morphine     agitation   Sertraline Hcl Other (See Comments)    Migraine headaches, severe   Morphine And Related Anxiety    Family History  Problem Relation Age of Onset   Alzheimer's disease Mother    Alcohol abuse Father    COPD Brother    Heart disease Neg Hx    Cancer Neg Hx     Prior to Admission medications   Medication Sig Start Date End Date Taking? Authorizing Provider  acetaminophen (TYLENOL) 325 MG tablet Take 650 mg by mouth every 6 (six) hours as needed for mild pain.   Yes [provider]  amLODipine (NORVASC) 5 MG tablet TAKE 1 TABLET BY MOUTH ONCE DAILY Patient taking differently: Take 5 mg by mouth daily. 12/06/21  Yes Venia Carbon, MD  apixaban (ELIQUIS) 5 MG TABS tablet Take 1  tablet (5 mg total) by mouth 2 (two) times daily. 02/09/22 03/11/22 Yes Wyvonnia Dusky, MD  Cholecalciferol (VITAMIN D) 400 UNITS capsule Take 800 Units by mouth daily.    Yes [provider]  potassium chloride SA (KLOR-CON M) 20 MEQ tablet TAKE 1 TABLET BY MOUTH ONCE DAILY Patient taking differently: Take 20 mEq by mouth daily. 12/06/21  Yes Viviana Simpler I, MD  cefTRIAXone (ROCEPHIN) IVPB Inject 2 g into the vein daily. Indication:  Klebsiella bacteremia with possible endocarditis Last Day of Therapy:  03/12/2022 Labs weekly on Monday while on IV antibiotics: CBC with differential, CMP Fax weekly lab results promptly to [phone] Please pull PICC at completion of IV antibiotics Method of administration: IV Push Method of administration may be changed at the discretion of home infusion pharmacist based upon assessment of the patient and/or caregiver's ability to self-administer the medication ordered. 02/09/22 03/13/22  Wyvonnia Dusky, MD    Physical Exam: Vitals:   02/13/22  1900 02/13/22 1931 02/13/22 2116 02/13/22 2118  BP: 129/64 126/73 123/65   Pulse: (!) 107 (!) 112 (!) 110   Resp: '20 18 18   '$ Temp:  98 F (36.7 C) 98.4 F (36.9 C)   TempSrc:  Oral    SpO2: 100% 97% 99%   Weight:    46.1 kg  Height:    5' (1.524 m)   Physical Exam Vitals and nursing note reviewed.  Constitutional:      General: She is not in acute distress.    Appearance: She is normal weight.  HENT:     Head: Normocephalic and atraumatic.     Mouth/Throat:     Mouth: Mucous membranes are dry.  Eyes:     General: No scleral icterus.    Conjunctiva/sclera: Conjunctivae normal.  Cardiovascular:     Rate and Rhythm: Regular rhythm. Tachycardia present.  Pulmonary:     Effort: Pulmonary effort is normal. No respiratory distress.     Breath sounds: Normal breath sounds. No wheezing, rhonchi or rales.  Abdominal:     General: Bowel sounds are normal.     Palpations: Abdomen is  soft.     Tenderness: There is generalized abdominal tenderness. There is no guarding.  Musculoskeletal:     Right lower leg: No edema.     Left lower leg: No edema.  Skin:    General: Skin is warm and dry.     Comments: Surgical staples on right hip are clean and dry with no surrounding erythema or purulent drainage.  Appear to be healing well.  Neurological:     Mental Status: She is lethargic and disoriented.     Comments:  Patient appears lethargic but will occasionally respond to voice.  When asked questions regarding orientation, patient does not reply.  Unable to check strength due to inability to follow commands    Data Reviewed: CBC remarkable for white blood count of 13.5, hemoglobin of 11.2, platelets of 117.  CMP remarkable for sodium of 150, chloride of 119, bicarb of 19, glucose of 130, BUN of 36, creatinine of 1.43 and albumin of 3.3.  PT/INR elevated at 1.7-20.1.  PTT within normal limits.  Troponin negative.  Lactic acid negative x2.  Procalcitonin less than 0.10.  Urinalysis noted for ketones, proteinuria, and pyuria.  Chest x-ray with no acute cardiopulmonary disease.  Pelvis x-ray consistent with recent intramedullary nail fixation with associated severe soft tissue edema consistent with postsurgical changes.  CT head with expected evolution of prior infarcts but no acute intracranial abnormalities.  CT renal study with no acute intra-abdominal process.  EKG personally reviewed.  Consistent with sinus tachycardia with rate of 106.  No acute ST or T wave changes.  There are no new results to review at this time.  Assessment and Plan: * Acute encephalopathy Patient presenting with acute encephalopathy of 1 day duration.  I suspect patient's encephalopathy secondary to urinary retention and hypernatremia.  Low suspicion at this time for UTI given patient is currently on antibiotics for recent bacteremia.  Urine culture already obtained and ID contacted by ED provider.  -  Management of urinary retention and hyponatremia as noted below - Will follow culture data  Acute urinary retention Recent hospitalization during which patient experienced urinary retention with Foley removal on day of discharge.  Presenting today with approximately 1.2 L of output on catheter insertion.  I suspect retention is multifactorial, including neurogenic given patient has recently experienced embolic infarcts and mechanical given patient's  mobility is limited with recent femur fracture.  - Continue Foley catheter - Strict in and out - Recommend outpatient referral to urology.  Hypernatremia Hypernatremia up to 150 on admission.  Per patient's son Katie Bell, patient has not been drinking fluids consistently at [facility].  I suspect urinary retention is contributing as well. Appears hypovolemic on examination  - IV fluid resuscitation with LR - Repeat BMP in the a.m.  AKI (acute kidney injury) ([place]) Secondary to urinary retention and dehydration.  - IV fluid resuscitation - Repeat BMP in the a.m.  Sinus tachycardia Low suspicion for new infection, suspect tachycardia secondary to dehydration and encephalopathy.  -Telemetry monitoring - IV fluid resuscitation as noted above  Leucocytosis Stable at this time  - Repeat CBC in the a.m.  Bacteremia due to Klebsiella pneumoniae - Continue ceftriaxone - ID consulted; appreciate their recommendations  Acute deep vein thrombosis (DVT) of tibial vein of both lower extremities (HCC) Provoked DVT in the setting of fracture and prolonged hospitalization.  - Continue home Eliquis   Advance Care Planning:   Code Status: Full Code.  Confirmed with patient's son  Consults: ID  Family Communication: [name] son updated.   Severity of Illness: The appropriate patient status for this patient is OBSERVATION. Observation status is judged to be reasonable and necessary in order to provide the required intensity of service to ensure the  patient's safety. The patient's presenting symptoms, physical exam findings, and initial radiographic and laboratory data in the context of their medical condition is felt to place them at decreased risk for further clinical deterioration. Furthermore, it is anticipated that the patient will be medically stable for discharge from the hospital within 2 midnights of admission.   Author: Jose Persia, MD 02/13/2022 11:00 PM  For on call review [email or web address].

## 2022-02-13 NOTE — ED Triage Notes (Signed)
Pt presents via ems with reports of change in baseline AMS.  No known history of dementia per EMS.  Stitches in right hip from 10/20 with concerns for infection.  Rocephin started 10/20.

## 2022-02-13 NOTE — ED Notes (Signed)
Pt reposition in bed to right side

## 2022-02-13 NOTE — Progress Notes (Signed)
CODE SEPSIS - PHARMACY COMMUNICATION  **Broad Spectrum Antibiotics should be administered within 1 hour of Sepsis diagnosis**  Time Code Sepsis Called/Page Received: 1826  Antibiotics Ordered: ceftriaxone 1gm  Time of 1st antibiotic administration: 1934  Additional action taken by pharmacy: n/a  Alison Murray ,PharmD Clinical Pharmacist  02/13/2022  6:36 PM

## 2022-02-13 NOTE — ED Provider Notes (Signed)
[facility] Provider Note    Event Date/Time   First MD Initiated Contact with Patient 02/13/22 1556     (approximate)   History   Altered Mental Status (Change in baseline, no known hx of dementia per ems)  EM caveat: Patient unable to provide history, confusion with altered mental status  HPI  AZALYNN MAXIM is a 81 y.o. female history of previous hip fracture, acute kidney injury, previous documentation of sepsis, records reviewed from [facility] that accompanied the patient.  Also appears the patient has been on Rocephin for 3 days now.  Nursing report received denotes this was for concern of possible infection near one of her staple lines from her hip surgery   Patient unable to give history, she only reports pain in the hip area when moving.  She is not able to provide any other notable history.  She is able to tell me her name, but does not recognize month and does identify me as a doctor though.  She does not appear in any acute pain or discomfort when left resting, but with movement and urinary catheterization reports pain in the right hip area  Review of discharge summary from October 20 of this year denotes the patient had a DVT, is anticoagulated, and also suffered a ischemic stroke due to embolic phenomena and there was concern for possible endocarditis for which she is continuing on IV Rocephin for a total of 6 weeks  Physical Exam   Triage Vital Signs: ED Triage Vitals [02/13/22 1556]  Enc Vitals Group     BP      Pulse      Resp      Temp      Temp src      SpO2 95 %     Weight      Height      Head Circumference      Peak Flow      Pain Score      Pain Loc      Pain Edu?      Excl. in [place]?     Most recent vital signs: Vitals:   02/13/22 2116 02/13/22 2348  BP: 123/65 (!) 152/81  Pulse: (!) 110 (!) 101  Resp: 18 18  Temp: 98.4 F (36.9 C) 97.6 F (36.4 C)  SpO2: 99% 99%     General: Somnolent but no acute  distress.  Reports pain in the right hip area with movements.  Otherwise rest comfortably.  Able to tell me her name and recognize me as a doctor but now not able to give [place] a synced or accurate history CV:  Good peripheral perfusion.  Normal heart tones except slight tachycardia Resp:  Normal effort.  Clear bilaterally.  Speaks without distress has no oxygen deficit on room air.  No obvious respiratory distress Abd:  Mild to moderate suprapubic distention, remainder of abdomen and quadrants nontender.  Patient does report tenderness to palpation over the suprapubic region.   Other:  Right hip appears atraumatic.  Does not appear to be shortened or obviously dislocated.  Normal perfusion to the lower extremities bilaterally.  Staples of the right hip appear clean dry and intact and over all incisions. Patient does have evidence of an evolving decubitus ulcer about the size of a fingernail overlying the coccyx with surrounding erythema and slight drainage   ED Results / Procedures / Treatments   Labs (all labs ordered are listed, but only abnormal results are  displayed) Labs Reviewed  COMPREHENSIVE METABOLIC PANEL - Abnormal; Notable for the following components:      Result Value   Sodium 150 (*)    Chloride 119 (*)    CO2 19 (*)    Glucose, Bld 130 (*)    BUN 36 (*)    Creatinine, Ser 1.43 (*)    Albumin 3.3 (*)    GFR, Estimated 37 (*)    All other components within normal limits  CBC WITH DIFFERENTIAL/PLATELET - Abnormal; Notable for the following components:   WBC 13.5 (*)    RBC 3.61 (*)    Hemoglobin 11.2 (*)    MCV 101.7 (*)    Platelets 517 (*)    Neutro Abs 11.0 (*)    Abs Immature Granulocytes 0.08 (*)    All other components within normal limits  PROTIME-INR - Abnormal; Notable for the following components:   Prothrombin Time 20.1 (*)    INR 1.7 (*)    All other components within normal limits  URINALYSIS, COMPLETE (UACMP) WITH MICROSCOPIC - Abnormal; Notable for the  following components:   Color, Urine YELLOW (*)    APPearance HAZY (*)    Ketones, ur 5 (*)    Protein, ur 30 (*)    Leukocytes,Ua SMALL (*)    WBC, UA >50 (*)    Bacteria, UA RARE (*)    All other components within normal limits  CULTURE, BLOOD (ROUTINE X 2)  CULTURE, BLOOD (ROUTINE X 2)  URINE CULTURE  LACTIC ACID, PLASMA  LACTIC ACID, PLASMA  APTT  PROCALCITONIN  CBC  BASIC METABOLIC PANEL  CBG MONITORING, ED  TROPONIN I (HIGH SENSITIVITY)     EKG  Entered by me at 1557 heart rate 105 QRS 80 QTc 450 Sinus tachycardia, mild peaked appearance of the T waves noted in most many of the leads including inferior and lateral. No obvious ischemia.     RADIOLOGY    CT Renal Stone Study  Addendum Date: 02/13/2022   ADDENDUM REPORT: 02/13/2022 18:56 ADDENDUM: Additionally, there is a new right paramedian sacral decubitus ulcer without CT evidence of underlying osteomyelitis. No fluid collection. Electronically Signed   By: Titus Dubin M.D.   On: 02/13/2022 18:56   Result Date: 02/13/2022 CLINICAL DATA:  Altered mental status. EXAM: CT ABDOMEN AND PELVIS WITHOUT CONTRAST TECHNIQUE: Multidetector CT imaging of the abdomen and pelvis was performed following the standard protocol without IV contrast. RADIATION DOSE REDUCTION: This exam was performed according to the departmental dose-optimization program which includes automated exposure control, adjustment of the mA and/or kV according to patient size and/or use of iterative reconstruction technique. COMPARISON:  CT abdomen pelvis dated July 12, 2020. FINDINGS: Lower chest: No acute abnormality. Hepatobiliary: No focal liver abnormality is seen. Status post cholecystectomy. No biliary dilatation. Pancreas: Unremarkable. No pancreatic ductal dilatation or surrounding inflammatory changes. Spleen: Normal in size without focal abnormality. Adrenals/Urinary Tract: The adrenal glands are unremarkable. Unchanged large left renal simple  cyst. No follow-up imaging is recommended. No renal calculi or hydronephrosis. The bladder is decompressed by Foley catheter. Stomach/Bowel: Stomach is within normal limits. Appendix appears normal. No evidence of bowel wall thickening, distention, or inflammatory changes. Vascular/Lymphatic: Aortic atherosclerosis. No enlarged abdominal or pelvic lymph nodes. Reproductive: Uterus and bilateral adnexa are unremarkable. Other: No free fluid or pneumoperitoneum. Musculoskeletal: No acute or significant osseous findings. Partially healed right intertrochanteric femur fracture status post ORIF. 2.2 cm subcutaneous hematoma in the lateral proximal thigh at the level of the  subtrochanteric femur. IMPRESSION: 1. No acute intra-abdominal process. 2. Partially healed right intertrochanteric femur fracture status post ORIF. 3.  Aortic Atherosclerosis (ICD10-I70.0). Electronically Signed: By: Titus Dubin M.D. On: 02/13/2022 18:45   CT Head Wo Contrast  Result Date: 02/13/2022 CLINICAL DATA:  Change in baseline altered mental status. Recent hip surgery. EXAM: CT HEAD WITHOUT CONTRAST TECHNIQUE: Contiguous axial images were obtained from the base of the skull through the vertex without intravenous contrast. RADIATION DOSE REDUCTION: This exam was performed according to the departmental dose-optimization program which includes automated exposure control, adjustment of the mA and/or kV according to patient size and/or use of iterative reconstruction technique. COMPARISON:  CT head without contrast 02/06/2022 and 02/04/2022. MR head without and with contrast 01/30/2022. FINDINGS: Brain: Expected evolution of a right parietal infarct is present. No residual blood products are present. Moderate atrophy and white matter disease is otherwise stable. No acute infarct or hemorrhage is present. No focal mass lesion is present. Basal ganglia are within normal limits. No acute cortical abnormality is present. The brainstem and  cerebellum are within normal limits. Vascular: Atherosclerotic calcifications are present within the cavernous internal carotid arteries bilaterally. No hyperdense vessel is present. Skull: Calvarium is intact. No focal lytic or blastic lesions are present. No significant extracranial soft tissue lesion is present. Sinuses/Orbits: The paranasal sinuses and mastoid air cells are clear. Bilateral lens replacements are noted. Globes and orbits are otherwise unremarkable. IMPRESSION: 1. Expected evolution of a right parietal infarct. No residual blood products are present. 2. Stable atrophy and white matter disease. This likely reflects the sequela of chronic microvascular ischemia. 3. No acute intracranial abnormality or significant interval change. Electronically Signed   By: San Morelle M.D.   On: 02/13/2022 17:56   DG Pelvis Portable  Result Date: 02/13/2022 CLINICAL DATA:  R hip pain (post-surgery), eval for any obvious pathlogy EXAM: PORTABLE PELVIS 1-2 VIEWS COMPARISON:  None Available. FINDINGS: Status post intramedullary nail fixation of the right proximal femoral fracture that is partially visualized. There is no evidence of pelvic fracture or diastasis. Frontal view of the left hip unremarkable. No pelvic bone lesions are seen. Degenerative changes visualized lower lumbar spine. Associated severe soft tissue edema and emphysema consistent with postsurgical changes. Skin staples overlie the right hip. IMPRESSION: Status post intramedullary nail fixation of the right proximal femoral fracture. Electronically Signed   By: Iven Finn M.D.   On: 02/13/2022 16:54   DG Chest Port 1 View  Result Date: 02/13/2022 CLINICAL DATA:  Questionable sepsis - evaluate for abnormality EXAM: PORTABLE CHEST 1 VIEW COMPARISON:  Chest x-ray 02/09/2022 FINDINGS: Right PICC with tip overlying the superior cavoatrial junction. The heart and mediastinal contours are unchanged. Aortic calcification No focal  consolidation. Chronic coarsened interstitial markings with no pulmonary edema. No pleural effusion. No pneumothorax. No acute osseous abnormality. Bilateral acromioclavicular joint degenerative changes. IMPRESSION: No active disease. Electronically Signed   By: Iven Finn M.D.   On: 02/13/2022 16:53          Chest x-ray interpreted by me as negative for acute finding   PROCEDURES:  Critical Care performed: Yes, see critical care procedure note(s)  Procedures   MEDICATIONS ORDERED IN ED: Medications                      lactated ringers bolus 500 mL (0 mLs Intravenous Stopped 02/13/22 1800)  lactated ringers bolus 500 mL (0 mLs Intravenous Stopped 02/13/22 1942)  cefTRIAXone (ROCEPHIN) 1 g  in sodium chloride 0.9 % 100 mL IVPB (1 g Intravenous New Bag/Given 02/13/22 1934)     IMPRESSION / MDM / ASSESSMENT AND PLAN / ED COURSE  I reviewed the triage vital signs and the nursing notes.                              Differential diagnosis includes, but is not limited to, sepsis, dehydration, ongoing infection, urinary tract infection, obstructive uropathy, intracranial hemorrhage, recurrent or new stroke, or acute metabolic or other abnormalities.  The differential diagnosis quite broad for this patient and we will work-up in this context  [name] presentation is most consistent with acute complicated illness / injury requiring diagnostic workup.  The patient is on the cardiac monitor to evaluate for evidence of arrhythmia and/or significant heart rate changes.    Coccyx decubitus  Urinalysis notable for pyuria.  Based on clinical history recent evaluation, concern for encephalopathy with high risk possibly due to urinary tract infection.  Will initiate for antibiotic treatment, further care and treatment under the hospitalist service discussed with Dr. Charleen Kirks  FINAL CLINICAL IMPRESSION(S) / ED DIAGNOSES   Final diagnoses:  Altered mental status, unspecified  altered mental status type  AKI (acute kidney injury) ([place])  Hypernatremia  Acute cystitis without hematuria     Rx / DC Orders   ED Discharge Orders     None        Note:  This document was prepared using Dragon voice recognition software and may include unintentional dictation errors.   Delman Kitten, MD 02/14/22 Laureen Abrahams

## 2022-02-13 NOTE — ED Notes (Signed)
Assumed care of pt. Pt confused disoriented to place, person and time. Admitting MD at bedside. Vital signs stable

## 2022-02-13 NOTE — Assessment & Plan Note (Addendum)
Hypernatremia up to 150 on admission.  Per patient's son Katie Bell, patient has not been drinking fluids consistently at [facility].  Pt appeared dry on exam. - IV fluid resuscitation with LR --Na did not improve on 1/2 NS infusion, so switched to D5.  Na normalized today Plan: --d/c D5'@50'$ 

## 2022-02-13 NOTE — Assessment & Plan Note (Signed)
Low suspicion for new infection, suspect tachycardia secondary to dehydration and encephalopathy. Plan: --no need for tele

## 2022-02-13 NOTE — Sepsis Progress Note (Signed)
Sepsis protocol monitored by eLink 

## 2022-02-13 NOTE — Assessment & Plan Note (Addendum)
Recent klebsiella bacteremia for which she is getting  IV ceftriaxone until 03/12/22 as we are treating like possible endocarditis because of embolic stroke and not able to do TEE

## 2022-02-13 NOTE — Assessment & Plan Note (Addendum)
Patient presenting with acute encephalopathy of 1 day duration.  I suspect patient's encephalopathy secondary to urinary retention and hypernatremia and dehydration.   --seems to have baseline dementia or cognitive decline --mental status improving gradually

## 2022-02-13 NOTE — Assessment & Plan Note (Addendum)
Provoked DVT in the setting of recent fracture and prolonged hospitalization. --heparin gtt while NPO Plan: --cont Eliquis

## 2022-02-13 NOTE — ED Notes (Signed)
Small amount of drainage and redness noted in sacral/coccyx area.  Dressing removed and new dressing applied.

## 2022-02-14 ENCOUNTER — Encounter: Payer: Self-pay | Admitting: Internal Medicine

## 2022-02-14 ENCOUNTER — Other Ambulatory Visit: Payer: Self-pay

## 2022-02-14 DIAGNOSIS — Z681 Body mass index (BMI) 19 or less, adult: Secondary | ICD-10-CM | POA: Diagnosis not present

## 2022-02-14 DIAGNOSIS — E870 Hyperosmolality and hypernatremia: Secondary | ICD-10-CM | POA: Diagnosis present

## 2022-02-14 DIAGNOSIS — Z9049 Acquired absence of other specified parts of digestive tract: Secondary | ICD-10-CM | POA: Diagnosis not present

## 2022-02-14 DIAGNOSIS — G9340 Encephalopathy, unspecified: Secondary | ICD-10-CM | POA: Diagnosis not present

## 2022-02-14 DIAGNOSIS — B961 Klebsiella pneumoniae [K. pneumoniae] as the cause of diseases classified elsewhere: Secondary | ICD-10-CM | POA: Diagnosis present

## 2022-02-14 DIAGNOSIS — N3000 Acute cystitis without hematuria: Secondary | ICD-10-CM | POA: Diagnosis present

## 2022-02-14 DIAGNOSIS — Z85820 Personal history of malignant melanoma of skin: Secondary | ICD-10-CM | POA: Diagnosis not present

## 2022-02-14 DIAGNOSIS — Z7901 Long term (current) use of anticoagulants: Secondary | ICD-10-CM | POA: Diagnosis not present

## 2022-02-14 DIAGNOSIS — R4182 Altered mental status, unspecified: Secondary | ICD-10-CM | POA: Diagnosis present

## 2022-02-14 DIAGNOSIS — Z811 Family history of alcohol abuse and dependence: Secondary | ICD-10-CM | POA: Diagnosis not present

## 2022-02-14 DIAGNOSIS — R339 Retention of urine, unspecified: Secondary | ICD-10-CM | POA: Diagnosis present

## 2022-02-14 DIAGNOSIS — Z8673 Personal history of transient ischemic attack (TIA), and cerebral infarction without residual deficits: Secondary | ICD-10-CM | POA: Diagnosis not present

## 2022-02-14 DIAGNOSIS — R338 Other retention of urine: Secondary | ICD-10-CM | POA: Diagnosis not present

## 2022-02-14 DIAGNOSIS — I10 Essential (primary) hypertension: Secondary | ICD-10-CM | POA: Diagnosis present

## 2022-02-14 DIAGNOSIS — Z87891 Personal history of nicotine dependence: Secondary | ICD-10-CM | POA: Diagnosis not present

## 2022-02-14 DIAGNOSIS — Z86718 Personal history of other venous thrombosis and embolism: Secondary | ICD-10-CM | POA: Diagnosis not present

## 2022-02-14 DIAGNOSIS — Z85828 Personal history of other malignant neoplasm of skin: Secondary | ICD-10-CM | POA: Diagnosis not present

## 2022-02-14 DIAGNOSIS — Z79899 Other long term (current) drug therapy: Secondary | ICD-10-CM | POA: Diagnosis not present

## 2022-02-14 DIAGNOSIS — E43 Unspecified severe protein-calorie malnutrition: Secondary | ICD-10-CM | POA: Diagnosis present

## 2022-02-14 DIAGNOSIS — Z825 Family history of asthma and other chronic lower respiratory diseases: Secondary | ICD-10-CM | POA: Diagnosis not present

## 2022-02-14 DIAGNOSIS — K219 Gastro-esophageal reflux disease without esophagitis: Secondary | ICD-10-CM | POA: Diagnosis present

## 2022-02-14 DIAGNOSIS — N179 Acute kidney failure, unspecified: Secondary | ICD-10-CM | POA: Diagnosis present

## 2022-02-14 DIAGNOSIS — G9341 Metabolic encephalopathy: Secondary | ICD-10-CM | POA: Diagnosis present

## 2022-02-14 DIAGNOSIS — E861 Hypovolemia: Secondary | ICD-10-CM | POA: Diagnosis present

## 2022-02-14 DIAGNOSIS — E860 Dehydration: Secondary | ICD-10-CM | POA: Diagnosis present

## 2022-02-14 DIAGNOSIS — R7881 Bacteremia: Secondary | ICD-10-CM | POA: Diagnosis present

## 2022-02-14 LAB — CBC
HCT: 30.9 % — ABNORMAL LOW (ref 36.0–46.0)
Hemoglobin: 9.7 g/dL — ABNORMAL LOW (ref 12.0–15.0)
MCH: 31.7 pg (ref 26.0–34.0)
MCHC: 31.4 g/dL (ref 30.0–36.0)
MCV: 101 fL — ABNORMAL HIGH (ref 80.0–100.0)
Platelets: 355 10*3/uL (ref 150–400)
RBC: 3.06 MIL/uL — ABNORMAL LOW (ref 3.87–5.11)
RDW: 14.3 % (ref 11.5–15.5)
WBC: 9 10*3/uL (ref 4.0–10.5)
nRBC: 0 % (ref 0.0–0.2)

## 2022-02-14 LAB — BASIC METABOLIC PANEL
Anion gap: 7 (ref 5–15)
BUN: 31 mg/dL — ABNORMAL HIGH (ref 8–23)
CO2: 22 mmol/L (ref 22–32)
Calcium: 8.9 mg/dL (ref 8.9–10.3)
Chloride: 123 mmol/L — ABNORMAL HIGH (ref 98–111)
Creatinine, Ser: 1.15 mg/dL — ABNORMAL HIGH (ref 0.44–1.00)
GFR, Estimated: 48 mL/min — ABNORMAL LOW (ref >60)
Glucose, Bld: 97 mg/dL (ref 70–99)
Potassium: 3.6 mmol/L (ref 3.5–5.1)
Sodium: 152 mmol/L — ABNORMAL HIGH (ref 135–145)

## 2022-02-14 LAB — APTT: aPTT: 110 seconds — ABNORMAL HIGH (ref 24–36)

## 2022-02-14 LAB — HEPARIN LEVEL (UNFRACTIONATED): Heparin Unfractionated: >1.1 IU/mL — ABNORMAL HIGH (ref 0.30–0.70)

## 2022-02-14 MED ORDER — POTASSIUM CHLORIDE IN NACL 20-0.45 MEQ/L-% IV SOLN
INTRAVENOUS | Status: DC
  Filled 2022-02-14 (×3): qty 1000

## 2022-02-14 MED ORDER — SODIUM CHLORIDE 0.9 % IV SOLN
2.0000 g | INTRAVENOUS | Status: DC
  Administered 2022-02-14 – 2022-02-20 (×7): 2 g via INTRAVENOUS
  Filled 2022-02-14 (×6): qty 2
  Filled 2022-02-14: qty 20

## 2022-02-14 MED ORDER — MORPHINE SULFATE (PF) 2 MG/ML IV SOLN
1.0000 mg | INTRAVENOUS | Status: DC | PRN
  Administered 2022-02-15 – 2022-02-20 (×14): 1 mg via INTRAVENOUS
  Filled 2022-02-14 (×14): qty 1

## 2022-02-14 MED ORDER — HEPARIN BOLUS VIA INFUSION
1400.0000 [IU] | Freq: Once | INTRAVENOUS | Status: AC
  Administered 2022-02-14: 1400 [IU] via INTRAVENOUS
  Filled 2022-02-14: qty 1400

## 2022-02-14 MED ORDER — CHLORHEXIDINE GLUCONATE CLOTH 2 % EX PADS
6.0000 | MEDICATED_PAD | Freq: Every day | CUTANEOUS | Status: DC
  Administered 2022-02-14 – 2022-02-20 (×7): 6 via TOPICAL

## 2022-02-14 MED ORDER — HEPARIN (PORCINE) 25000 UT/250ML-% IV SOLN
700.0000 [IU]/h | INTRAVENOUS | Status: AC
  Administered 2022-02-14: 800 [IU]/h via INTRAVENOUS
  Administered 2022-02-15: 750 [IU]/h via INTRAVENOUS
  Filled 2022-02-14 (×2): qty 250

## 2022-02-14 NOTE — Progress Notes (Signed)
[place] for Heparin Drip Indication: DVT  (pt on apixaban PTA for recent DVT (s/p surgery) but too lethargic to take oral med)  Patient Measurements: Height: 5' (152.4 cm) Weight: 46.1 kg (101 lb 10.1 oz) IBW/kg (Calculated) : 45.5 Heparin Dosing Weight: 46.1 kg  Labs: Recent Labs    02/13/22 1606 02/14/22 0513 02/14/22 1622  HGB 11.2* 9.7*  --   HCT 36.7 30.9*  --   PLT 517* 355  --   APTT 35  --  110*  LABPROT 20.1*  --   --   INR 1.7*  --   --   HEPARINUNFRC  --   --  >1.10*  CREATININE 1.43* 1.15*  --   TROPONINIHS 15  --   --      Estimated Creatinine Clearance: 27.1 mL/min (A) (by C-G formula based on SCr of 1.15 mg/dL (H)).   Medical History: Past Medical History:  Diagnosis Date   Anxiety    Cancer ([place]) 2015   basal cell removed from leg   Depression    Episodic mood disorder ([place])    mostly anxiety--depression in the past   Gastric ulcer    History   GERD (gastroesophageal reflux disease)    History of blood transfusion    At [facility] appro 15 yrs ago   History of hiatal hernia    History of kidney stones 1960   Hypertension    Malignant melanoma ([place]) 07/14/2014   Left proximal post lat thigh. Breslow's 0.2m, Anatomic level II. Excised 08/03/2014   Osteoarthritis, hand    hand   Postmenopausal disorder    SVD (spontaneous vaginal delivery)    x 3   Assessment: 81yo F to start heparin drip for DVT treatment as patient is too lethargic to take oral apixaban (PTA med).   Per H&P: recent admission 10/5-10/20/23. Acute deep vein thrombosis (DVT) of tibial vein of both lower extremities (HCC) from that admission. Provoked DVT in the setting of fracture and prolonged hospitalization and was discharged on apixaban  Last dose of apixaban per Med Rec was 02/13/22 @ 0900. Hgb 9.7  Plt 355   (10/24: INR=1.7 (note this is affected by apixaban), aPTT 35)  1025 1622 aPTT 110s, supra; 800  un/hr  Goal of Therapy:  Heparin level 0.3-0.7 units/ml Monitor platelets by anticoagulation protocol: Yes   Plan:  --Decrease heparin infusion to 750 units/hr --Re-check aPTT 8 hours from rate change; daily heparin level to evaluate for correlation. Switch to heparin level monitoring when correlation established --Daily CBC per protocol while on IV heparin  ABenita Gutter10/25/2023,4:55 PM

## 2022-02-14 NOTE — Progress Notes (Signed)
PROGRESS NOTE    Katie Bell  IEP:[id number] DOB: [birth year] DOA: 02/13/2022 PCP: Venia Carbon, MD  102A/102A-AA  LOS: 0 days   Brief hospital course:   Assessment & Plan: Katie Bell is a 81 y.o. female with medical history significant of hypertension, anxiety, previous nephrolithiasis and recent mechanical fall complicated by right intertrochanteric fracture s/p surgical intervention who presents to the ED with altered mental status.  Given patient's AMS, history obtained through chart review discussing with patient's son, [name].   Recently admitted from 10/05 - 10/20 after a mechanical fall that was complicated by right intertrochanteric fracture s/p surgical repair.  Hospital stay was complicated by Klebsiella pneumonia bacteremia, acute lower extremity DVT, acute embolic stroke and acute urinary retention.  She was discharged to SNF with plan to complete 6 weeks of IV ceftriaxone.   * Acute encephalopathy Patient presenting with acute encephalopathy of 1 day duration.  I suspect patient's encephalopathy secondary to urinary retention and hypernatremia and dehydration.  Low suspicion at this time for UTI given patient is currently on antibiotics for recent bacteremia.  Urine culture already obtained and ID contacted by ED provider. Plan: --NPO until pt is more alert and safe for oral intake  Acute urinary retention Recent hospitalization during which patient experienced urinary retention with Foley removal on day of discharge.  Presenting with approximately 1.2 L of output on catheter insertion.   Plan: --cont Foley for now  Hypernatremia Hypernatremia up to 150 on admission.  Per patient's son [name], patient has not been drinking fluids consistently at [facility].  Pt appeared dry on exam. - IV fluid resuscitation with LR Plan: --cont 1/2NS'@50'$   AKI (acute kidney injury) ([place]) --Cr 1.43 on presentation.  Baseline around 0.8.  Secondary to urinary retention and  dehydration. --s/p IVF Plan: --cont MIVF  Sinus tachycardia Low suspicion for new infection, suspect tachycardia secondary to dehydration and encephalopathy. Plan: --no need for tele  Leucocytosis Resolved the next day.  Bacteremia due to Klebsiella pneumoniae - Continue ceftriaxone - ID consulted; appreciate their recommendations  Acute deep vein thrombosis (DVT) of tibial vein of both lower extremities (HCC) Provoked DVT in the setting of recent fracture and prolonged hospitalization. Plan: --heparin gtt while NPO   DVT prophylaxis: [id number] gtt Code Status: Full code  Family Communication:  Level of care: Med-Surg Dispo:   The patient is from: SNF rehab Anticipated d/c is to: SNF rehab Anticipated d/c date is: 2-3 days Patient currently is not medically ready to d/c due to: lethargic, on IVF   Subjective and Interval History:  Pt exhibited signs of pain when moved.  No pain at rest.  Mental status improved some later in the day, would answer some simple questions.     Objective: Vitals:   02/13/22 2348 02/14/22 0341 02/14/22 0834 02/14/22 1642  BP: (!) 152/81 122/60 136/84 (!) 145/63  Pulse: (!) 101 (!) 106 (!) 107 (!) 104  Resp: '18 16 14 16  '$ Temp: 97.6 F (36.4 C) 98.6 F (37 C) 98.7 F (37.1 C) 98.3 F (36.8 C)  TempSrc:  Oral    SpO2: 99% 95% 100% 97%  Weight:      Height:        Intake/Output Summary (Last 24 hours) at 02/14/2022 1841 Last data filed at 02/14/2022 1737 Gross per 24 hour  Intake 909.66 ml  Output 680 ml  Net 229.66 ml   Filed Weights   02/13/22 2118  Weight: 46.1 kg    Examination:  Constitutional: NAD, alert, kept eyes closed, not oriented CV: No cyanosis.   RESP: normal respiratory effort, on RA Extremities: No effusions, edema in BLE SKIN: warm, dry   Data Reviewed: I have personally reviewed labs and imaging studies  Time spent: 50 minutes  Enzo Bi, MD [facility] If 7PM-7AM, please contact  night-coverage 02/14/2022, 6:41 PM

## 2022-02-14 NOTE — Progress Notes (Signed)
Initial Nutrition Assessment  DOCUMENTATION CODES:   Not applicable  INTERVENTION:   -RD will follow for diet advancement and add supplements as appropriate  NUTRITION DIAGNOSIS:   Inadequate oral intake related to inability to eat as evidenced by NPO status.  GOAL:   Patient will meet greater than or equal to 90% of their needs  MONITOR:   PO intake, Supplement acceptance, Diet advancement  REASON FOR ASSESSMENT:   Malnutrition Screening Tool    ASSESSMENT:   Pt with medical history significant of hypertension, anxiety, previous nephrolithiasis and recent mechanical fall complicated by right intertrochanteric fracture s/p surgical intervention who presents with altered mental status.  Pt admitted with acute encephalopathy and acute urinary retention.   Reviewed I/O's: -1.1 L x 24 hours  UOP: 1.6 L x 24 hours  Pt unavailable at time of visit. Pt in with MD or therapies at times of attempted visits. RD unable to obtain further nutrition-related history or complete nutrition-focused physical exam at this time.   Pt NPO due to inability to take PO's at this time secondary to mental status.     Reviewed wt hx; pt has experienced a 8.3% wt loss over the past 6 months. While this is not significant for time frame, it is concerning given history of poor oral intake and history of malnutrition, which RD suspects is ongoing.   Medications reviewed and include thiamine, folic acid, and 3.14% NaCl with KCl 20 mEq/L infusion @ 50 ml/hr.   Lab Results  Component Value Date   HGBA1C 5.1 01/31/2022   PTA DM medications are none.   Labs reviewed: Na: 152, CBGS: 144 (inpatient orders for glycemic control are ).    Diet Order:   Diet Order             Diet NPO time specified  Diet effective now                   EDUCATION NEEDS:   Not appropriate for education at this time  Skin:  Skin Assessment: Skin Integrity Issues: Skin Integrity Issues:: Incisions, Other  (Comment) Incisions: closed rt hip Other: pressure injury to sacrum  Last BM:  02/14/22 (type 7)  Height:   Ht Readings from Last 1 Encounters:  02/13/22 5' (1.524 m)    Weight:   Wt Readings from Last 1 Encounters:  02/13/22 46.1 kg    Ideal Body Weight:  45.5 kg  BMI:  Body mass index is 19.85 kg/m.  Estimated Nutritional Needs:   Kcal:  1650-1850  Protein:  80-95 grams  Fluid:  > 1.6 L    Loistine Chance, RD, LDN, [place] Registered Dietitian II Certified Diabetes Care and Education Specialist Please refer to [facility] for RD and/or RD on-call/weekend/after hours pager

## 2022-02-14 NOTE — Progress Notes (Signed)
OT Cancellation Note  Patient Details Name: Katie Bell MRN: [id number] DOB: Jan 13, 1941   Cancelled Treatment:    Reason Eval/Treat Not Completed: Pain limiting ability to participate;Fatigue/lethargy limiting ability to participate. OT attempted this morning, but pt not able to follow commands and minimally verbally responsive; discussed with RN and MD via secure chat, as pt was demonstrating pain behaviors with raising HOB (no pain medication currently given, per RN; pt with recent hx of R hip fx surgery). In PM when OT attempted again, pt a little more alert, but still not following many commands; opened eyes minimally. MD in room a few minutes later and in agreement with HOLD of therapy for today. Will attempt to re-evaluate tomorrow.  Waymon Amato, MS, OTR/L   Vania Rea 02/14/2022, 2:04 PM

## 2022-02-14 NOTE — Progress Notes (Signed)
   02/14/22 1642  Assess: MEWS Score  Temp 98.3 F (36.8 C)  BP (!) 145/63  MAP (mmHg) 85  Pulse Rate (!) 104  Resp 16  SpO2 97 %  O2 Device Room Air  Assess: MEWS Score  MEWS Temp 0  MEWS Systolic 0  MEWS Pulse 1  MEWS RR 0  MEWS LOC 1  MEWS Score 2  MEWS Score Color Yellow  Assess: if the MEWS score is Yellow or Red  Were vital signs taken at a resting state? Yes  Focused Assessment Change from prior assessment (see assessment flowsheet)  Does the patient meet 2 or more of the SIRS criteria? Yes  Does the patient have a confirmed or suspected source of infection? Yes  Provider and Rapid Response Notified? No  MEWS guidelines implemented *See Row Information* Yes  Treat  Pain Scale PAINAD  Breathing 0  Negative Vocalization 1  Facial Expression 0  Body Language 0  Consolability 0  PAINAD Score 1  Take Vital Signs  Increase Vital Sign Frequency  Yellow: Q 2hr X 2 then Q 4hr X 2, if remains yellow, continue Q 4hrs  Escalate  MEWS: Escalate Yellow: discuss with charge nurse/RN and consider discussing with provider and RRT  Notify: Charge Nurse/RN  Name of Charge Nurse/RN Notified Gwendel Hanson RN  Date Charge Nurse/RN Notified 02/14/22  Time Charge Nurse/RN Notified [place]  Document  Patient Outcome Other (Comment) (continue to monitor)  Progress note created (see row info) Yes  Assess: SIRS CRITERIA  SIRS Temperature  0  SIRS Pulse 1  SIRS Respirations  0  SIRS WBC 1  SIRS Score Sum  2

## 2022-02-14 NOTE — Progress Notes (Signed)
PT Cancellation Note  Patient Details Name: Katie Bell MRN: [id number] DOB: 03-May-1940   Cancelled Treatment:    Reason Eval/Treat Not Completed: Pain limiting ability to participate. Orders received and chart reviewed. Pt visibly in pain, resistant to mobility, intermittently crying and shouting in pain mobilizing RLE. PT and OT in agreement to hold in hopes of pain management prior to evaluations. RN and primary MD messaged via secure chat. Will re-attempt at later time/date.    Salem Caster. Fairly IV, PT, DPT Physical Therapist- [facility]  02/14/2022, 9:31 AM

## 2022-02-14 NOTE — Progress Notes (Signed)
Dr Billie Ruddy made aware that pt unable to take PO eliquis-NPO and lethargic, new order for IV heparin placed

## 2022-02-14 NOTE — Progress Notes (Addendum)
ANTICOAGULATION CONSULT NOTE - Initial Consult  Pharmacy Consult for heparin drip Indication: DVT   (pt on apixaban PTA for recent DVT (s/p surgery) but too lethargic to take oral med)  Allergies  Allergen Reactions   Sulfamethoxazole-Trimethoprim Swelling    Angioedema and pruritis per 01/01/2017 documentation in care everywhere   Estradiol Other (See Comments)    Vaginal irritation Other reaction(s): Other (See Comments) Vaginal irritation   Morphine     agitation   Sertraline Hcl Other (See Comments)    Migraine headaches, severe   Morphine And Related Anxiety    Patient Measurements: Height: 5' (152.4 cm) Weight: 46.1 kg (101 lb 10.1 oz) IBW/kg (Calculated) : 45.5 Heparin Dosing Weight:    Vital Signs: Temp: 98.6 F (37 C) (10/25 0341) Temp Source: Oral (10/25 0341) BP: 122/60 (10/25 0341) Pulse Rate: 106 (10/25 0341)  Labs: Recent Labs    02/13/22 1606 02/14/22 0513  HGB 11.2* 9.7*  HCT 36.7 30.9*  PLT 517* 355  APTT 35  --   LABPROT 20.1*  --   INR 1.7*  --   CREATININE 1.43* 1.15*  TROPONINIHS 15  --     Estimated Creatinine Clearance: 27.1 mL/min (A) (by C-G formula based on SCr of 1.15 mg/dL (H)).   Medical History: Past Medical History:  Diagnosis Date   Anxiety    Cancer ([place]) 2015   basal cell removed from leg   Depression    Episodic mood disorder ([place])    mostly anxiety--depression in the past   Gastric ulcer    History   GERD (gastroesophageal reflux disease)    History of blood transfusion    At [facility] appro 15 yrs ago   History of hiatal hernia    History of kidney stones 1960   Hypertension    Malignant melanoma ([place]) 07/14/2014   Left proximal post lat thigh. Breslow's 0.88m, Anatomic level II. Excised 08/03/2014   Osteoarthritis, hand    hand   Postmenopausal disorder    SVD (spontaneous vaginal delivery)    x 3    Medications:  Medications Prior to Admission  Medication Sig Dispense Refill Last Dose    acetaminophen (TYLENOL) 325 MG tablet Take 650 mg by mouth every 6 (six) hours as needed for mild pain.   02/13/2022 at 0900   amLODipine (NORVASC) 5 MG tablet TAKE 1 TABLET BY MOUTH ONCE DAILY (Patient taking differently: Take 5 mg by mouth daily.) 90 tablet 1 02/13/2022 at 0900   apixaban (ELIQUIS) 5 MG TABS tablet Take 1 tablet (5 mg total) by mouth 2 (two) times daily. 60 tablet 0 02/13/2022 at 0900   Cholecalciferol (VITAMIN D) 400 UNITS capsule Take 800 Units by mouth daily.    02/13/2022 at 0900   potassium chloride SA (KLOR-CON M) 20 MEQ tablet TAKE 1 TABLET BY MOUTH ONCE DAILY (Patient taking differently: Take 20 mEq by mouth daily.) 30 tablet 5 02/13/2022 at 0900   cefTRIAXone (ROCEPHIN) IVPB Inject 2 g into the vein daily. Indication:  Klebsiella bacteremia with possible endocarditis Last Day of Therapy:  03/12/2022 Labs weekly on Monday while on IV antibiotics: CBC with differential, CMP Fax weekly lab results promptly to [phone] Please pull PICC at completion of IV antibiotics Method of administration: IV Push Method of administration may be changed at the discretion of home infusion pharmacist based upon assessment of the patient and/or caregiver's ability to self-administer the medication ordered. 33 Units 0    Scheduled:   amLODipine  5 mg Oral Daily   Chlorhexidine Gluconate Cloth  6 each Topical Daily   folic acid  1 mg Intravenous Daily   heparin  1,400 Units Intravenous Once   sodium chloride flush  3 mL Intravenous Q12H   thiamine (VITAMIN B1) injection  100 mg Intravenous Daily   Infusions:   cefTRIAXone (ROCEPHIN)  IV     heparin      Assessment:T 81 yo F to start Heparin drip for DV treatment as patient is too lethargic to take oral apixaban (PTA med).   Per H&P: recent admission 10/5-10/20/23. Acute deep vein thrombosis (DVT) of tibial vein of both lower extremities (HCC) from that admission.Provoked DVT in the setting of fracture and prolonged  hospitalization and was discharged on apixaban  Last dose of apixaban per Med Rec was 02/13/22 @ 0900. Hgb 9.7  Plt 355   (10/24: INR=1.7 (note this is affected by apixaban), aPTT 35)    Goal of Therapy:  Heparin level 0.3-0.7 units/ml Monitor platelets by anticoagulation protocol: Yes   Plan:  Give reduced dose bolus of 1400 units x 1 Start heparin infusion at 800 units/hr Check anti-Xa and aPTT level in 8 hours  F/u for correlation between HL and aPTT Continue to monitor H&H and platelets daily  Shanicqua Coldren A 02/14/2022,8:05 AM

## 2022-02-15 DIAGNOSIS — G9340 Encephalopathy, unspecified: Secondary | ICD-10-CM | POA: Diagnosis not present

## 2022-02-15 DIAGNOSIS — R338 Other retention of urine: Secondary | ICD-10-CM | POA: Diagnosis not present

## 2022-02-15 DIAGNOSIS — N179 Acute kidney failure, unspecified: Secondary | ICD-10-CM | POA: Diagnosis not present

## 2022-02-15 LAB — BASIC METABOLIC PANEL
Anion gap: 12 (ref 5–15)
BUN: 25 mg/dL — ABNORMAL HIGH (ref 8–23)
CO2: 23 mmol/L (ref 22–32)
Calcium: 9.1 mg/dL (ref 8.9–10.3)
Chloride: 117 mmol/L — ABNORMAL HIGH (ref 98–111)
Creatinine, Ser: 1.04 mg/dL — ABNORMAL HIGH (ref 0.44–1.00)
GFR, Estimated: 54 mL/min — ABNORMAL LOW (ref >60)
Glucose, Bld: 77 mg/dL (ref 70–99)
Potassium: 3.3 mmol/L — ABNORMAL LOW (ref 3.5–5.1)
Sodium: 152 mmol/L — ABNORMAL HIGH (ref 135–145)

## 2022-02-15 LAB — URINE CULTURE: Culture: >=100000 — AB

## 2022-02-15 LAB — APTT
aPTT: 91 seconds — ABNORMAL HIGH (ref 24–36)
aPTT: 109 seconds — ABNORMAL HIGH (ref 24–36)

## 2022-02-15 LAB — CBC
HCT: 37.7 % (ref 36.0–46.0)
Hemoglobin: 11.3 g/dL — ABNORMAL LOW (ref 12.0–15.0)
MCH: 31.1 pg (ref 26.0–34.0)
MCHC: 30 g/dL (ref 30.0–36.0)
MCV: 103.9 fL — ABNORMAL HIGH (ref 80.0–100.0)
Platelets: 373 10*3/uL (ref 150–400)
RBC: 3.63 MIL/uL — ABNORMAL LOW (ref 3.87–5.11)
RDW: 14.3 % (ref 11.5–15.5)
WBC: 7.9 10*3/uL (ref 4.0–10.5)
nRBC: 0 % (ref 0.0–0.2)

## 2022-02-15 LAB — HEPARIN LEVEL (UNFRACTIONATED)
Heparin Unfractionated: >1.1 IU/mL — ABNORMAL HIGH (ref 0.30–0.70)
Heparin Unfractionated: >1.1 IU/mL — ABNORMAL HIGH (ref 0.30–0.70)

## 2022-02-15 LAB — MAGNESIUM: Magnesium: 2.3 mg/dL (ref 1.7–2.4)

## 2022-02-15 MED ORDER — POTASSIUM CHLORIDE 2 MEQ/ML IV SOLN
INTRAVENOUS | Status: DC
  Filled 2022-02-15 (×4): qty 1000

## 2022-02-15 MED ORDER — NEPRO/CARBSTEADY PO LIQD
237.0000 mL | Freq: Three times a day (TID) | ORAL | Status: DC
  Administered 2022-02-15 – 2022-02-20 (×14): 237 mL via ORAL

## 2022-02-15 MED ORDER — APIXABAN 5 MG PO TABS
5.0000 mg | ORAL_TABLET | Freq: Two times a day (BID) | ORAL | Status: DC
  Administered 2022-02-15 – 2022-02-20 (×10): 5 mg via ORAL
  Filled 2022-02-15 (×10): qty 1

## 2022-02-15 MED ORDER — VITAMIN C 500 MG PO TABS
500.0000 mg | ORAL_TABLET | Freq: Two times a day (BID) | ORAL | Status: DC
  Administered 2022-02-15 – 2022-02-20 (×10): 500 mg via ORAL
  Filled 2022-02-15 (×10): qty 1

## 2022-02-15 MED ORDER — ADULT MULTIVITAMIN W/MINERALS CH
1.0000 | ORAL_TABLET | Freq: Every day | ORAL | Status: DC
  Administered 2022-02-16 – 2022-02-20 (×5): 1 via ORAL
  Filled 2022-02-15 (×5): qty 1

## 2022-02-15 MED ORDER — ZINC SULFATE 220 (50 ZN) MG PO CAPS
220.0000 mg | ORAL_CAPSULE | Freq: Every day | ORAL | Status: DC
  Administered 2022-02-16 – 2022-02-20 (×5): 220 mg via ORAL
  Filled 2022-02-15 (×5): qty 1

## 2022-02-15 NOTE — Progress Notes (Signed)
ANTICOAGULATION CONSULT NOTE  Pharmacy Consult for Heparin Drip Indication: DVT  (pt on apixaban PTA for recent DVT (s/p surgery) but too lethargic to take oral med)  Patient Measurements: Height: 5' (152.4 cm) Weight: 46.1 kg (101 lb 10.1 oz) IBW/kg (Calculated) : 45.5 Heparin Dosing Weight: 46.1 kg  Labs: Recent Labs    02/13/22 1606 02/14/22 0513 02/14/22 1622 02/15/22 0220 02/15/22 1040  HGB 11.2* 9.7*  --  11.3*  --   HCT 36.7 30.9*  --  37.7  --   PLT 517* 355  --  373  --   APTT 35  --  110* 91* 109*  LABPROT 20.1*  --   --   --   --   INR 1.7*  --   --   --   --   HEPARINUNFRC  --   --  >1.10* >1.10* >1.10*  CREATININE 1.43* 1.15*  --  1.04*  --   TROPONINIHS 15  --   --   --   --      Estimated Creatinine Clearance: 30 mL/min (A) (by C-G formula based on SCr of 1.04 mg/dL (H)).   Medical History: Past Medical History:  Diagnosis Date   Anxiety    Cancer ([place]) 2015   basal cell removed from leg   Depression    Episodic mood disorder ([place])    mostly anxiety--depression in the past   Gastric ulcer    History   GERD (gastroesophageal reflux disease)    History of blood transfusion    At [facility] appro 15 yrs ago   History of hiatal hernia    History of kidney stones 1960   Hypertension    Malignant melanoma ([place]) 07/14/2014   Left proximal post lat thigh. Breslow's 0.52m, Anatomic level II. Excised 08/03/2014   Osteoarthritis, hand    hand   Postmenopausal disorder    SVD (spontaneous vaginal delivery)    x 3   Assessment: 81yo F to start heparin drip for DVT treatment as patient is too lethargic to take oral apixaban (PTA med).   Per H&P: recent admission 10/5-10/20/23. Acute deep vein thrombosis (DVT) of tibial vein of both lower extremities (HCC) from that admission. Provoked DVT in the setting of fracture and prolonged hospitalization and was discharged on apixaban  Last dose of apixaban per Med Rec was 02/13/22 @ 0900. Hgb  9.7  Plt 355   (10/24: INR=1.7 (note this is affected by apixaban), aPTT 35)  1025'@1622'$ : aPTT 110s, supra; 800 un/hr 1026'@0220'$ : aPTT 91s/HL>1.10, thera x 1; 750 un/hr 1026'@1040'$ : aPTT 109/HL >1.10, supratherapeutic at 750 units/hr  Goal of Therapy:  Heparin level 0.3-0.7 units/ml Monitor platelets by anticoagulation protocol: Yes   Plan:  --aPTT supratherapeutic, HL currently elevated due to apixaban --Decrease heparin infusion to 700 units/hr --Check aPTT in 8 hours; daily heparin level to evaluate for correlation. Switch to heparin level monitoring when correlation established --Daily CBC per protocol while on IV heparin  LPaulina Fusi PharmD, BCPS 02/15/2022 2:09 PM

## 2022-02-15 NOTE — Progress Notes (Signed)
PROGRESS NOTE    Katie Bell  ERX:[id number] DOB: 1940/12/11 DOA: 02/13/2022 PCP: Venia Carbon, MD  102A/102A-AA  LOS: 1 day   Brief hospital course:   Assessment & Plan: Katie Bell is a 81 y.o. female with medical history significant of hypertension, anxiety, previous nephrolithiasis and recent mechanical fall complicated by right intertrochanteric fracture s/p surgical intervention who presents to the ED with altered mental status.  Given patient's AMS, history obtained through chart review discussing with patient's son, Katie Bell.   Recently admitted from 10/05 - 10/20 after a mechanical fall that was complicated by right intertrochanteric fracture s/p surgical repair.  Hospital stay was complicated by Klebsiella pneumonia bacteremia, acute lower extremity DVT, acute embolic stroke and acute urinary retention.  She was discharged to SNF with plan to complete 6 weeks of IV ceftriaxone.   * Acute encephalopathy Patient presenting with acute encephalopathy of 1 day duration.  I suspect patient's encephalopathy secondary to urinary retention and hypernatremia and dehydration.   --seems to have baseline dementia or cognitive decline  Acute urinary retention Recent hospitalization during which patient experienced urinary retention with Foley removal on day of discharge.  Presenting with approximately 1.2 L of output on catheter insertion.   Plan: --cont Foley for now  Hypernatremia Hypernatremia up to 150 on admission.  Per patient's son Katie Bell, patient has not been drinking fluids consistently at [facility].  Pt appeared dry on exam. - IV fluid resuscitation with LR --Na has not improved on 1/2 NS infusion Plan: --switch to D5'@50'$   AKI (acute kidney injury) ([place]) --Cr 1.43 on presentation.  Baseline around 0.8.  Secondary to urinary retention and dehydration. --s/p IVF Plan: --cont MIVF  Sinus tachycardia Low suspicion for new infection, suspect tachycardia secondary to  dehydration and encephalopathy. Plan: --no need for tele  Leucocytosis Resolved the next day.  Bacteremia due to Klebsiella pneumoniae - Continue ceftriaxone - ID consult  Acute deep vein thrombosis (DVT) of tibial vein of both lower extremities (HCC) Provoked DVT in the setting of recent fracture and prolonged hospitalization. --heparin gtt while NPO Plan: --switch back to Eliquis today    DVT prophylaxis: PJ:KDTOIZT Code Status: Full code  Family Communication:  Level of care: Med-Surg Dispo:   The patient is from: SNF rehab Anticipated d/c is to: SNF rehab Anticipated d/c date is: 2-3 days Patient currently is not medically ready to d/c due to: lethargic, on D5 for hypernatremia   Subjective and Interval History:  Pt was more alert today, but speech was intelligible.  SLP cleared pt for Dys 1 diet.   Objective: Vitals:   02/14/22 1931 02/15/22 0352 02/15/22 0759 02/15/22 1627  BP: (!) 142/70 (!) 145/84 (!) 149/79 (!) 152/80  Pulse: 99 100 99 83  Resp: '17 18 18 19  '$ Temp: 99 F (37.2 C) 97.9 F (36.6 C) 98.7 F (37.1 C) (!) 97.1 F (36.2 C)  TempSrc:      SpO2: 92% 100% 98% 99%  Weight:      Height:        Intake/Output Summary (Last 24 hours) at 02/15/2022 1732 Last data filed at 02/15/2022 0400 Gross per 24 hour  Intake 800 ml  Output 1130 ml  Net -330 ml   Filed Weights   02/13/22 2118  Weight: 46.1 kg    Examination:   Constitutional: NAD, alert, not oriented, speech intelligible   CV: No cyanosis.   RESP: normal respiratory effort, on RA Extremities: No effusions, edema in BLE SKIN: warm,  dry Psych: anxious and upset mood and affect.     Data Reviewed: I have personally reviewed labs and imaging studies  Time spent: 50 minutes  Katie Bi, MD [facility] If 7PM-7AM, please contact night-coverage 02/15/2022, 5:32 PM

## 2022-02-15 NOTE — Progress Notes (Signed)
[facility] for Heparin Drip Indication: DVT  (pt on apixaban PTA for recent DVT (s/p surgery) but too lethargic to take oral med)  Patient Measurements: Height: 5' (152.4 cm) Weight: 46.1 kg (101 lb 10.1 oz) IBW/kg (Calculated) : 45.5 Heparin Dosing Weight: 46.1 kg  Labs: Recent Labs    02/13/22 1606 02/14/22 0513 02/14/22 1622 02/15/22 0220  HGB 11.2* 9.7*  --  11.3*  HCT 36.7 30.9*  --  37.7  PLT 517* 355  --  373  APTT 35  --  110* 91*  LABPROT 20.1*  --   --   --   INR 1.7*  --   --   --   HEPARINUNFRC  --   --  >1.10* >1.10*  CREATININE 1.43* 1.15*  --  1.04*  TROPONINIHS 15  --   --   --      Estimated Creatinine Clearance: 30 mL/min (A) (by C-G formula based on SCr of 1.04 mg/dL (H)).   Medical History: Past Medical History:  Diagnosis Date   Anxiety    Cancer ([place]) 2015   basal cell removed from leg   Depression    Episodic mood disorder ([place])    mostly anxiety--depression in the past   Gastric ulcer    History   GERD (gastroesophageal reflux disease)    History of blood transfusion    At [facility] appro 15 yrs ago   History of hiatal hernia    History of kidney stones 1960   Hypertension    Malignant melanoma ([place]) 07/14/2014   Left proximal post lat thigh. Breslow's 0.44m, Anatomic level II. Excised 08/03/2014   Osteoarthritis, hand    hand   Postmenopausal disorder    SVD (spontaneous vaginal delivery)    x 3   Assessment: 81yo F to start heparin drip for DVT treatment as patient is too lethargic to take oral apixaban (PTA med).   Per H&P: recent admission 10/5-10/20/23. Acute deep vein thrombosis (DVT) of tibial vein of both lower extremities (HCC) from that admission. Provoked DVT in the setting of fracture and prolonged hospitalization and was discharged on apixaban  Last dose of apixaban per Med Rec was 02/13/22 @ 0900. Hgb 9.7  Plt 355   (10/24: INR=1.7 (note this is affected by  apixaban), aPTT 35)  1025'@1622'$ : aPTT 110s, supra; 800 un/hr 1026'@0220'$ : aPTT 91s/HL>1.10, thera x 1; 750 un/hr  Goal of Therapy:  Heparin level 0.3-0.7 units/ml Monitor platelets by anticoagulation protocol: Yes   Plan:  --aPTT therapeutic x 1, HL currently elevated --Continue heparin infusion at 750 units/hr --Check confirmatory aPTT in 8 hours; daily heparin level to evaluate for correlation. Switch to heparin level monitoring when correlation established --Daily CBC per protocol while on IV heparin  Melquan Ernsberger A Kaylen Nghiem 02/15/2022,3:19 AM

## 2022-02-15 NOTE — Evaluation (Signed)
Occupational Therapy Evaluation Patient Details Name: Katie Bell MRN: [id number] DOB: [birth year] Today's Date: 02/15/2022   History of Present Illness Katie Bell is a 81 y.o. female with medical history significant of hypertension, anxiety, previous nephrolithiasis and recent mechanical fall complicated by right intertrochanteric fracture s/p surgical intervention who presents to the ED with altered mental status.  Given patient's AMS, history obtained through chart review discussing with patient's son, Katie Bell.     Recently admitted from 10/05 - 10/20 after a mechanical fall that was complicated by right intertrochanteric fracture s/p surgical repair.  Hospital stay was complicated by Klebsiella pneumonia bacteremia, acute lower extremity DVT, acute embolic stroke and acute urinary retention.  She was discharged to SNF with plan to complete 6 weeks of IV ceftriaxone.     Per EMS report, SNF staff concerned for altered mental status and possible underlying infection of surgical staples.  I was unable to reach patient's SNF.  I discussed with patient's son Katie Bell who states patient has been doing well the last couple days and was even able to walk some yesterday.  [name] has been complaining that she is not getting enough water at the facility and Katie Bell has noticed that her appetite has gone down.   Clinical Impression   Patient presenting with decreased independence in self-care, functional mobility, safety awareness, balance, and strength/endurance. Patient a very poor historian, not able to provide any PLOF. Very emotional during session with c/o pain with touch. Patient currently functioning at min A for oral care, for thoroughness (suction toothbrush used). Performed bed mobility with max A. Patient very disoriented, max multimodal cuing for attending, sequencing, initiation of tasks. Patient very internally and externally distracted. Transfer attempted, requiring total A due to patient  stretching legs forward. Patient placed back in supine with max A. Patient left in bed with call bell in reach, bed alarm set, and all needs met. Patient will benefit from acute OT to increase overall independence in the areas of ADLs, functional mobility, in order to safely discharge to the next venue of care.       Recommendations for follow up therapy are one component of a multi-disciplinary discharge planning process, led by the attending physician.  Recommendations may be updated based on patient status, additional functional criteria and insurance authorization.   Follow Up Recommendations  Skilled nursing-short term rehab (<3 hours/day)    Assistance Recommended at Discharge Frequent or constant Supervision/Assistance  Patient can return home with the following A lot of help with walking and/or transfers;A lot of help with bathing/dressing/bathroom;Assistance with cooking/housework;Assist for transportation;Help with stairs or ramp for entrance;Assistance with feeding;Direct supervision/assist for medications management;Direct supervision/assist for financial management    Functional Status Assessment  Patient has had a recent decline in their functional status and demonstrates the ability to make significant improvements in function in a reasonable and predictable amount of time.  Equipment Recommendations  Other (comment) (Defer to next venue of care.)    Recommendations for Other Services       Precautions / Restrictions Precautions Precautions: Fall Restrictions Weight Bearing Restrictions: Yes RLE Weight Bearing: Weight bearing as tolerated      Mobility Bed Mobility Overal bed mobility: Needs Assistance Bed Mobility: Supine to Sit, Sit to Supine     Supine to sit: Max assist Sit to supine: Max assist        Transfers   Equipment used:  (gait belt) Transfers: Sit to/from Stand Sit to Stand: Total assist  Balance Overall balance  assessment: Needs assistance Sitting-balance support: Bilateral upper extremity supported, Feet supported Sitting balance-Leahy Scale: Fair       Standing balance-Leahy Scale: Poor                             ADL either performed or assessed with clinical judgement   ADL       Grooming: Oral care;Minimal assistance Grooming Details (indicate cue type and reason): min A for thoroughness                                      Pertinent Vitals/Pain Pain Assessment Pain Assessment: Faces Faces Pain Scale: Hurts a little bit Pain Location: right hip Pain Descriptors / Indicators: Moaning, Crying, Grimacing, Guarding Pain Intervention(s): Limited activity within patient's tolerance, Monitored during session, Repositioned     Hand Dominance Right   Extremity/Trunk Assessment Upper Extremity Assessment Upper Extremity Assessment: Generalized weakness   Lower Extremity Assessment Lower Extremity Assessment: Generalized weakness       Communication Communication Communication: Expressive difficulties   Cognition Arousal/Alertness: Awake/alert Behavior During Therapy: Restless, Flat affect Overall Cognitive Status: Impaired/Different from baseline Area of Impairment: Orientation, Attention, Safety/judgement, Awareness, Problem solving, Memory                 Orientation Level: Disoriented to, Place, Time, Situation, Person Current Attention Level: Alternating Memory: Decreased recall of precautions, Decreased short-term memory Following Commands: Follows one step commands inconsistently Safety/Judgement: Decreased awareness of safety, Decreased awareness of deficits Awareness: Anticipatory Problem Solving: Requires verbal cues, Difficulty sequencing, Decreased initiation, Slow processing General Comments: pt oriented to self, able to follow simple commands with intermittent VC,requiring consistent reassurance/encouragement                 Home Living Family/patient expects to be discharged to:: Skilled nursing facility Living Arrangements: Children Available Help at Discharge: Available PRN/intermittently;Family Type of Home: House Home Access: Stairs to enter Technical brewer of Steps: 5 Entrance Stairs-Rails: Right;Left Home Layout: One level               Home Equipment: None          Prior Functioning/Environment Prior Level of Function : Patient poor historian/Family not available             Mobility Comments: As per Son: pt lived with her until recent fall and  ORIF to R hip.  she was transferred to [facility] for rehab. Pt prior to all this, was Ind at [facility] level ambulation without AD, Independent  with taking medicaitons, bathing, dressing, eating and toileting. Son does all the cooking or meal planning. ADLs Comments: MOD I with ADL/IADL at baseline per chart review        OT Problem List: Decreased strength;Pain;Decreased cognition;Decreased safety awareness;Impaired balance (sitting and/or standing);Decreased knowledge of use of DME or AE;Decreased knowledge of precautions      OT Treatment/Interventions: Self-care/ADL training;Therapeutic exercise;Therapeutic activities;Cognitive remediation/compensation;DME and/or AE instruction;Patient/family education;Balance training    OT Goals(Current goals can be found in the care plan section) Acute Rehab OT Goals Patient Stated Goal: none stated OT Goal Formulation: With patient Time For Goal Achievement: 03/01/22 Potential to Achieve Goals: Fair  OT Frequency: Min 2X/week       AM-PAC OT "6 Clicks" Daily Activity     Outcome Measure Help from another person eating meals?: A  Lot Help from another person taking care of personal grooming?: A Lot Help from another person toileting, which includes using toliet, bedpan, or urinal?: Total Help from another person bathing (including washing, rinsing, drying)?: A Lot Help from another person  to put on and taking off regular upper body clothing?: A Lot Help from another person to put on and taking off regular lower body clothing?: A Lot 6 Click Score: 11   End of Session Equipment Utilized During Treatment: Gait belt;Rolling walker (2 wheels) Nurse Communication: Mobility status  Activity Tolerance: Patient limited by fatigue;Patient limited by pain Patient left: in bed;with call bell/phone within reach;with bed alarm set  OT Visit Diagnosis: Other abnormalities of gait and mobility (R26.89);Muscle weakness (generalized) (M62.81);History of falling (Z91.81);Pain                Time: 0919-8022 OT Time Calculation (min): 21 min Charges:  OT General Charges $OT Visit: 1 Visit OT Evaluation $OT Eval Moderate Complexity: 1 Mod OT Treatments $Self Care/Home Management : 8-22 mins    Adabella Stanis, OTS 02/15/2022, 2:27 PM

## 2022-02-15 NOTE — Care Management Important Message (Signed)
Important Message  Patient Details  Name: Katie Bell MRN: [id number] Date of Birth: 1941-04-10   Medicare Important Message Given:  N/A - LOS <3 / Initial given by admissions     Dannette Barbara 02/15/2022, 4:55 PM

## 2022-02-15 NOTE — Progress Notes (Addendum)
Nutrition Follow-up  DOCUMENTATION CODES:   Severe malnutrition in context of social or environmental circumstances  INTERVENTION:   -Feeding assistance with meals -Nepro Shake po TID, each supplement provides 425 kcal and 19 grams protein  -Magic cup TID with meals, each supplement provides 290 kcal and 9 grams of protein  -500 mg vitamin C BID -220 mg zinc sulfate daily x 14 days  NUTRITION DIAGNOSIS:   Severe Malnutrition related to social / environmental circumstances as evidenced by severe fat depletion, severe muscle depletion.  Ongoing  GOAL:   Patient will meet greater than or equal to 90% of their needs  Progressing   MONITOR:   PO intake, Supplement acceptance, Diet advancement  REASON FOR ASSESSMENT:   Malnutrition Screening Tool    ASSESSMENT:   Pt with medical history significant of hypertension, anxiety, previous nephrolithiasis and recent mechanical fall complicated by right intertrochanteric fracture s/p surgical intervention who presents with altered mental status.  Reviewed I/O's: +80 ml x 24 hours and -970 ml since admission  UOP: 1.1 L x 24 hours   Pt lying in bed at time of visit. Pt was very tearful. RD attempted to reorient pt to situation.   Pt has been advanced to dysphagia 1 diet with nectar thick liquids. RD will add supplements.    Reviewed wt hx; pt has experienced a 8.3% wt loss over the past 6 months. While this is not significant for time frame, it is concerning given history of poor oral intake and malnutrition.   Medications reviewed and include folic acid, vitamin B1, and dextrose% 1000 ml with potassium chloride 40 mEq/L infusion @ 50 ml/hr.   Labs reviewed: Na: 152, K: 3.3, CBGS: 144 (inpatient orders for glycemic control are none).    NUTRITION - FOCUSED PHYSICAL EXAM:  Flowsheet Row Most Recent Value  Orbital Region Severe depletion  Upper Arm Region Severe depletion  Thoracic and Lumbar Region Severe depletion  Buccal  Region Severe depletion  Temple Region Severe depletion  Clavicle Bone Region Severe depletion  Clavicle and Acromion Bone Region Severe depletion  Scapular Bone Region Severe depletion  Dorsal Hand Severe depletion  Patellar Region Severe depletion  Anterior Thigh Region Severe depletion  Posterior Calf Region Severe depletion  Edema (RD Assessment) None  Hair Reviewed  Eyes Reviewed  Mouth Reviewed  Skin Reviewed  Nails Reviewed       Diet Order:   Diet Order             Diet NPO time specified  Diet effective now                   EDUCATION NEEDS:   Not appropriate for education at this time  Skin:  Skin Assessment: Skin Integrity Issues: Skin Integrity Issues:: Incisions, Other (Comment) Incisions: closed rt hip Other: pressure injury to sacrum  Last BM:  02/15/22  Height:   Ht Readings from Last 1 Encounters:  02/13/22 5' (1.524 m)    Weight:   Wt Readings from Last 1 Encounters:  02/13/22 46.1 kg    Ideal Body Weight:  45.5 kg  BMI:  Body mass index is 19.85 kg/m.  Estimated Nutritional Needs:   Kcal:  1650-1850  Protein:  80-95 grams  Fluid:  > 1.6 L    Loistine Chance, RD, LDN, [place] Registered Dietitian II Certified Diabetes Care and Education Specialist Please refer to [facility] for RD and/or RD on-call/weekend/after hours pager

## 2022-02-15 NOTE — Consult Note (Signed)
NAME: Katie Bell  DOB: 06-Jun-1940  MRN: [id number]  Date/Time: 02/15/2022 6:33 PM  REQUESTING PROVIDER: Huston Foley Subjective:  REASON FOR CONSULT: VRE urine ? Katie Bell is a 81 y.o. with a history of  HTN, Anxiety disorder, GERD, renal stone presented to ED on 02/13/22 with altered mental status from NH  Was recently in the hospital between 10/4-10/20/23 for a fall and had rt intertrochanteric fracture . She underwent intra medullary nailing on 01/25/22 . Hops course complicated by confusion/delirium, new CVA, fever, klesiella bacteremia, leukemoid reaction. B/l popliteal vein DVT, severe ecchymosis at the surgical site. Neuro was concerned that she could have a cardiac source for the embolic strokes- as TEE was not done due to family being concerned about her comorbidities and  patient frailtyit was decided to treat with 4 weeks of IV antibiotic- Pt went to SNF on 02/13/22 with PICC and to continue IV ceftriaxone until 03/12/22 Pt returned from SNF on 02/13/22 with change is mental status On presentation hr vitals were  02/13/22  BP 152/81 !  Temp 97.6 F (36.4 C)  Pulse Rate 101 !  Resp 18  SpO2 99 %     Latest Reference Range & Units 02/13/22  WBC 4.0 - 10.5 K/uL 13.5 (H)  Hemoglobin 12.0 - 15.0 g/dL 11.2 (L)  HCT 36.0 - 46.0 % 36.7  Platelets 150 - 400 K/uL 517 (H)  Creatinine 0.44 - 1.00 mg/dL 1.43 (H)   She was found to be in AKI, hypernatremic, dehydrated and also had urinary retention- straight cath had 1200c c of urine . Foley placed I was consulted for antibiotic management and told them to just continue ceftriaxone     Past Medical History:  Diagnosis Date   Anxiety    Cancer ([place]) 2015   basal cell removed from leg   Depression    Episodic mood disorder ([place])    mostly anxiety--depression in the past   Gastric ulcer    History   GERD (gastroesophageal reflux disease)    History of blood transfusion    At [facility] appro [facility] yrs ago    History of hiatal hernia    History of kidney stones 1960   Hypertension    Malignant melanoma ([place]) 07/14/2014   Left proximal post lat thigh. Breslow's 0.104m, Anatomic level II. Excised 08/03/2014   Osteoarthritis, hand    hand   Postmenopausal disorder    SVD (spontaneous vaginal delivery)    x 3    Past Surgical History:  Procedure Laterality Date   CATARACT EXTRACTION W/PHACO Right 02/12/2019   Procedure: CATARACT EXTRACTION PHACO AND INTRAOCULAR LENS PLACEMENT ([place];  Surgeon: HMarchia Meiers MD;  Location: [facility] ORS;  Service: Ophthalmology;  Laterality: Right;  [place]01:34.2 CDE 17.97 Fluid Pack lot # [id number]   CATARACT EXTRACTION W/PHACO Left 03/26/2019   Procedure: CATARACT EXTRACTION PHACO AND INTRAOCULAR LENS PLACEMENT ([place] LEFT VISION BLUE;  Surgeon: HMarchia Meiers MD;  Location: [facility] ORS;  Service: Ophthalmology;  Laterality: Left;  [place]01:16.2 CDE 11.70 FLUID PACK LOT # [id number]   CHOLECYSTECTOMY  2008   COLONOSCOPY  04/2011   CYSTOCELE REPAIR N/A 12/31/2013   Procedure: ANTERIOR REPAIR (CYSTOCELE);  Surgeon: TDonnamae Jude MD;  [facility];  Service: Gynecology;  Laterality: N/A;   CYSTOSCOPY W/ URETERAL STENT PLACEMENT Left 01/16/2016   Procedure: CYSTOSCOPY WITH STENT REPLACEMENT;  Surgeon: AHollice Espy MD;  Location: [facility] OR[facility];  Service: Urology;  Laterality: Left;   [place]  Left 12/23/2015   Procedure: CYSTOSCOPY WITH STENT PLACEMENT;  Surgeon: Ardis Hughs, MD;  Location: [facility];  Service: Urology;  Laterality: Left;   DIAGNOSTIC LAPAROSCOPY     EYE SURGERY     INTRAMEDULLARY (IM) NAIL INTERTROCHANTERIC Right 01/25/2022   Procedure: INTRAMEDULLARY (IM) NAIL INTERTROCHANTERIC;  Surgeon: Renee Harder, MD;  Location: [facility] ORS;  Service: Orthopedics;  Laterality: Right;   kidney stone removed     MELANOMA EXCISION  2015   left posterior thigh   TONSILLECTOMY  1944   TUBAL LIGATION     UPPER GI ENDOSCOPY     URETEROSCOPY WITH  HOLMIUM LASER LITHOTRIPSY Left 01/16/2016   Procedure: URETEROSCOPY WITH HOLMIUM LASER LITHOTRIPSY;  Surgeon: Hollice Espy, MD;  Location: [facility] OR[facility];  Service: Urology;  Laterality: Left;   WISDOM TOOTH EXTRACTION      Social History   Socioeconomic History   Marital status: Divorced    Spouse name: Not on file   Number of children: 3   Years of education: Not on file   Highest education level: Not on file  Occupational History   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 Then son [name]   Would accept resuscitation attempts but no prolonged ventilation   No tube feeds if cognitively unaware      Social Determinants of Health   Financial Resource Strain: Not on file  Food Insecurity: No Food Insecurity (02/14/2022)   Hunger Vital Sign    Worried About Running Out of Food in the Last Year: Never true    Ran Out of Food in the Last Year: Never true  Transportation Needs: No Transportation Needs (02/14/2022)   PRAPARE - Hydrologist (Medical): No    Lack of Transportation (Non-Medical): No  Physical Activity: Not on file  Stress: Not on file  Social Connections: Not on file  Intimate Partner Violence: Not At Risk (02/14/2022)   Humiliation, Afraid, Rape, and Kick questionnaire    Fear of Current or Ex-Partner: No    Emotionally Abused: No    Physically Abused: No    Sexually Abused: No    Family History  Problem Relation Age of Onset   Alzheimer's disease Mother    Alcohol abuse Father    COPD  Brother    Heart disease Neg Hx    Cancer Neg Hx    Allergies  Allergen Reactions   Sulfamethoxazole-Trimethoprim Swelling    Angioedema and pruritis per 01/01/2017 documentation in care everywhere   Estradiol Other (See Comments)    Vaginal irritation Other reaction(s): Other (See Comments) Vaginal irritation   Morphine     agitation   Sertraline Hcl Other (See Comments)    Migraine headaches, severe   Morphine And Related Anxiety   I? Current Facility-Administered Medications  Medication Dose Route Frequency Provider Last Rate Last Admin   acetaminophen (TYLENOL) tablet 650 mg  650 mg Oral Q6H PRN Charleen Kirks,  Albertine Grates, MD       Or   acetaminophen (TYLENOL) suppository 650 mg  650 mg Rectal Q6H PRN Jose Persia, MD       amLODipine (NORVASC) tablet 5 mg  5 mg Oral Daily Jose Persia, MD       apixaban (ELIQUIS) tablet 5 mg  5 mg Oral BID Vira Blanco, RPH       ascorbic acid (VITAMIN C) tablet 500 mg  500 mg Oral BID Enzo Bi, MD       cefTRIAXone (ROCEPHIN) 2 g in sodium chloride 0.9 % 100 mL IVPB  2 g Intravenous Q24H Jose Persia, MD 200 mL/hr at 02/15/22 1148 2 g at 02/15/22 1148   Chlorhexidine Gluconate Cloth 2 % PADS 6 each  6 each Topical Daily Jose Persia, MD   6 each at 02/15/22 0755   dextrose 5 % 1,000 mL with potassium chloride 40 mEq infusion   Intravenous Continuous Enzo Bi, MD 50 mL/hr at 02/15/22 1045 New Bag at 02/15/22 1045   feeding supplement (NEPRO CARB STEADY) liquid 237 mL  237 mL Oral TID BM Enzo Bi, MD   237 mL at 16/01/09 3235   folic acid injection 1 mg  1 mg Intravenous Daily Jose Persia, MD   1 mg at 02/15/22 1045   heparin ADULT infusion 100 units/mL (25000 units/269m)  700 Units/hr Intravenous Continuous KVira Blanco RPH 7 mL/hr at 02/15/22 1129 700 Units/hr at 02/15/22 1129   morphine (PF) 2 MG/ML injection 1 mg  1 mg Intravenous Q4H PRN LEnzo Bi MD   1 mg at 02/15/22 1656   multivitamin with minerals tablet 1 tablet  1  tablet Oral Daily LEnzo Bi MD       polyethylene glycol (MIRALAX / GLYCOLAX) packet 17 g  17 g Oral Daily PRN BJose Persia MD       sodium chloride flush (NS) 0.9 % injection 3 mL  3 mL Intravenous Q12H BJose Persia MD   3 mL at 02/15/22 0755   thiamine (VITAMIN B1) injection 100 mg  100 mg Intravenous Daily BJose Persia MD   100 mg at 02/15/22 05732  zinc sulfate capsule 220 mg  220 mg Oral Daily LEnzo Bi MD         Abtx:  Anti-infectives (From admission, onward)    Start     Dose/Rate Route Frequency Ordered Stop   02/14/22 1200  cefTRIAXone (ROCEPHIN) 2 g in sodium chloride 0.9 % 100 mL IVPB        2 g 200 mL/hr over 30 Minutes Intravenous Every 24 hours 02/14/22 0034     02/13/22 1845  cefTRIAXone (ROCEPHIN) 1 g in sodium chloride 0.9 % 100 mL IVPB        1 g 200 mL/hr over 30 Minutes Intravenous STAT 02/13/22 1839 02/14/22 0444       REVIEW OF SYSTEMS:  Pt is pleasantly confused Has some emotional outbursts and tearful No ROS available Objective:  VITALS:  BP (!) 152/80 (BP Location: Left Arm)   Pulse 83   Temp (!) 97.1 F (36.2 C)   Resp 19   Ht 5' (1.524 m)   Wt 46.1 kg   SpO2 99%   BMI 19.85 kg/m  LDA Rt PICC PHYSICAL EXAM:  General: awake, oriented in person only Responds to some commands confused  Head: Normocephalic, without obvious abnormality, atraumatic. Eyes: Conjunctivae clear, anicteric sclerae. Pupils are equal ENT Nares normal. No drainage or sinus tenderness. Lips,  mucosa, and tongue normal. No Thrush Neck: symmetrical, no adenopathy, thyroid: non tender no carotid bruit and no JVD. Lungs: Clear to auscultation bilaterally. No Wheezing or Rhonchi. No rales. Heart: s1s2. Abdomen: Soft, non-tender,not distended. Bowel sounds normal. No masses Extremities: atraumatic, no cyanosis. No edema. No clubbing Skin: No rashes or lesions. Or bruising Lymph: Cervical, supraclavicular normal. Neurologic: moves all limbs- left arm less than  other side Rt hip surgical site- no ecchymosis- site is well approximated- staples in place Pertinent Labs Lab Results CBC    Component Value Date/Time   WBC 7.9 02/15/2022 0220   RBC 3.63 (L) 02/15/2022 0220   HGB 11.3 (L) 02/15/2022 0220   HCT 37.7 02/15/2022 0220   PLT 373 02/15/2022 0220   MCV 103.9 (H) 02/15/2022 0220   MCH 31.1 02/15/2022 0220   MCHC 30.0 02/15/2022 0220   RDW 14.3 02/15/2022 0220   LYMPHSABS 1.3 02/13/2022 1606   MONOABS 0.9 02/13/2022 1606   EOSABS 0.1 02/13/2022 1606   BASOSABS 0.1 02/13/2022 1606       Latest Ref Rng & Units 02/15/2022    2:20 AM 02/14/2022    5:13 AM 02/13/2022    4:06 PM  CMP  Glucose 70 - 99 mg/dL 77  97  130   BUN 8 - 23 mg/dL 25  31  36   Creatinine 0.44 - 1.00 mg/dL 1.04  1.15  1.43   Sodium 135 - 145 mmol/L 152  152  150   Potassium 3.5 - 5.1 mmol/L 3.3  3.6  4.2   Chloride 98 - 111 mmol/L 117  123  119   CO2 22 - 32 mmol/L '23  22  19   '$ Calcium 8.9 - 10.3 mg/dL 9.1  8.9  9.5   Total Protein 6.5 - 8.1 g/dL   7.5   Total Bilirubin 0.3 - 1.2 mg/dL   1.0   Alkaline Phos 38 - 126 U/L   102   AST 15 - 41 U/L   24   ALT 0 - 44 U/L   18       Microbiology: Recent Results (from the past 240 hour(s))  Urine Culture     Status: Abnormal   Collection Time: 02/13/22  4:17 PM   Specimen: Urine, Random  Result Value Ref Range Status   Specimen Description   Final    URINE, RANDOM Performed at [facility], [address] [place], [place] [address]    Special Requests   Final    NONE Performed at [facility], [address] [place], [place] [address]    Culture (A)  Final    >=100,000 COLONIES/mL ENTEROCOCCUS FAECIUM VANCOMYCIN RESISTANT ENTEROCOCCUS ISOLATED    Report Status 02/15/2022 FINAL  Final   Organism ID, Bacteria ENTEROCOCCUS FAECIUM (A)  Final      Susceptibility   Enterococcus faecium - MIC*    AMPICILLIN >=32 RESISTANT Resistant     NITROFURANTOIN 32 SENSITIVE Sensitive      VANCOMYCIN >=32 RESISTANT Resistant     LINEZOLID 2 SENSITIVE Sensitive     * >=100,000 COLONIES/mL ENTEROCOCCUS FAECIUM  Blood Culture (routine x 2)     Status: None (Preliminary result)   Collection Time: 02/13/22  4:59 PM   Specimen: BLOOD  Result Value Ref Range Status   Specimen Description BLOOD LEFT ANTECUBITAL  Final   Special Requests   Final    BOTTLES DRAWN AEROBIC AND ANAEROBIC Blood Culture adequate volume   Culture  Final    NO GROWTH 2 DAYS Performed at [facility], [address] [place], [place] [address]    Report Status PENDING  Incomplete  Culture, blood (Routine X 2) w Reflex to ID Panel     Status: None (Preliminary result)   Collection Time: 02/15/22 12:42 AM   Specimen: BLOOD  Result Value Ref Range Status   Specimen Description BLOOD BLOOD LEFT HAND  Final   Special Requests   Final    BOTTLES DRAWN AEROBIC AND ANAEROBIC Blood Culture adequate volume   Culture   Final    NO GROWTH < 12 HOURS Performed at [facility], [address], [place], [place] [address]    Report Status PENDING  Incomplete    IMAGING RESULTS: Cthead-evolution of rt parietal infarct CXR - No infiltrate  I have personally reviewed the films ? Impression/Recommendation ? Encephalopathy on baseline confusion - due to dehydration, hypernatermia, AKI, urinary retention No evidence of sepsis  Urinary retention- has foley now  AKI resolved   VRE in urine is fecal contamination/colonization-hold antibiotic  as no evidence of systemic infection  Recent klebsiella bacteremia- continue IV ceftriaxone until 03/12/22 ?REcent CVA- embolic strokes- cardiac source of thrombus/vegetaion questioned. TEE could not be done - - being treated with prolonged course of Iv antibiotic  Recent leukemoid reaction resolved  Recent thrombocytopenia resolved   ___________________________________________________ Discussed with requesting provider

## 2022-02-15 NOTE — Evaluation (Signed)
Clinical/Bedside Swallow Evaluation Patient Details  Name: Katie Bell MRN: [id number] Date of Birth: 09/03/1940  Today's Date: 02/15/2022 Time: SLP Start Time (ACUTE ONLY): 1205 SLP Stop Time (ACUTE ONLY): 1255 SLP Time Calculation (min) (ACUTE ONLY): 50 min  Past Medical History:  Past Medical History:  Diagnosis Date   Anxiety    Cancer ([place]) 2015   basal cell removed from leg   Depression    Episodic mood disorder ([place])    mostly anxiety--depression in the past   Gastric ulcer    History   GERD (gastroesophageal reflux disease)    History of blood transfusion    At [facility] appro 15 yrs ago   History of hiatal hernia    History of kidney stones 1960   Hypertension    Malignant melanoma ([place]) 07/14/2014   Left proximal post lat thigh. Breslow's 0.43m, Anatomic level II. Excised 08/03/2014   Osteoarthritis, hand    hand   Postmenopausal disorder    SVD (spontaneous vaginal delivery)    x 3   Past Surgical History:  Past Surgical History:  Procedure Laterality Date   CATARACT EXTRACTION W/PHACO Right 02/12/2019   Procedure: CATARACT EXTRACTION PHACO AND INTRAOCULAR LENS PLACEMENT ([place];  Surgeon: HMarchia Meiers MD;  Location: [facility];  Service: Ophthalmology;  Laterality: Right;  [place]01:34.2 CDE 17.97 Fluid Pack lot # [id number]   CATARACT EXTRACTION W/PHACO Left 03/26/2019   Procedure: CATARACT EXTRACTION PHACO AND INTRAOCULAR LENS PLACEMENT ([place] LEFT VISION BLUE;  Surgeon: HMarchia Meiers MD;  Location: [facility];  Service: Ophthalmology;  Laterality: Left;  [place]01:16.2 CDE 11.70 FLUID PACK LOT # [id number]   CHOLECYSTECTOMY  2008   COLONOSCOPY  04/2011   CYSTOCELE REPAIR N/A 12/31/2013   Procedure: ANTERIOR REPAIR (CYSTOCELE);  Surgeon: TDonnamae Jude MD;  [facility];  Service: Gynecology;  Laterality: N/A;   CYSTOSCOPY W/ URETERAL STENT PLACEMENT Left 01/16/2016   Procedure: CYSTOSCOPY WITH STENT REPLACEMENT;  Surgeon: AHollice Espy MD;   Location: [facility];  Service: Urology;  Laterality: Left;   CYSTOSCOPY WITH STENT PLACEMENT Left 12/23/2015   Procedure: CYSTOSCOPY WITH STENT PLACEMENT;  Surgeon: BArdis Hughs MD;  Location: [facility];  Service: Urology;  Laterality: Left;   DIAGNOSTIC LAPAROSCOPY     EYE SURGERY     INTRAMEDULLARY (IM) NAIL INTERTROCHANTERIC Right 01/25/2022   Procedure: INTRAMEDULLARY (IM) NAIL INTERTROCHANTERIC;  Surgeon: CRenee Harder MD;  Location: [facility];  Service: Orthopedics;  Laterality: Right;   kidney stone removed     MELANOMA EXCISION  2015   left posterior thigh   TONSILLECTOMY  1944   TUBAL LIGATION     UPPER GI ENDOSCOPY     URETEROSCOPY WITH HOLMIUM LASER LITHOTRIPSY Left 01/16/2016   Procedure: URETEROSCOPY WITH HOLMIUM LASER LITHOTRIPSY;  Surgeon: AHollice Espy MD;  Location: [facility];  Service: Urology;  Laterality: Left;   WISDOM TOOTH EXTRACTION     HPI:  Pt is a 81y.o. female with medical history significant of essential hypertension, Anxiety disorder, previous history of basal cell carcinoma, Hiatal Hernia per chart, GERD and kidney stones.  She had a recent mechanical fall complicated by right intertrochanteric fracture s/p surgical intervention 01/24/2022 and was discharged to SNF for Rehab on 02/09/22.  She presents to the ED with altered mental status.  Recent hospital stay was complicated by Klebsiella pneumonia bacteremia, acute lower extremity DVT, acute embolic stroke and acute urinary retention.  She was discharged to SNF with plan to complete 6  weeks of IV ceftriaxone.     Per EMS report, SNF staff concerned for altered mental status and possible underlying infection of surgical staples.   Lst admit, pt found to have acute/subacute R parietal infarct per MRI.  Head CT this admit: stable atrophy and white matter disease. This likely reflects the  sequela of chronic microvascular ischemia.  No acute intracranial abnormality or significant interval change.    Assessment  / Plan / Recommendation  Clinical Impression   Pt seen for BSE today. Pt was recently assessed by this service ~2 weeks ago. Upon entering room, noted pt's apparent Cognitive decline as previously noted; she was min anxious and labile but soon calmed w/ encouragement and offering po trials. Somewhat mumbled/muttered speech; she picked at the covers often. Pt was A/O only to self; NOT time nor place. Pt appears cachectic and weak.  On RA, afebrile. WBC wnl.    Pt appears to present w/ mild+ oropharyngeal phase dysphagia in setting of declined Cognitive status currently; unsure of pt's Baseline Cognitive status as she had a similar presentation last admit/assessment. ANY Cognitive decline can impact overall awareness/timing of swallow and safety during po tasks which increases risk for aspiration/aspiration pneumonia -- pt's risk for developing pulmonary decline is increased d/t a more sedentary status s/p hip fx/repair last admit. She was discharged to Rehab but has readmitted now to the hospital. She appears deconditioned also. Pt's risk for aspiration can be reduced when following general aspiration precautions, feeding support at meals d/t Confusion, and using a modified diet consistency of a dysphagia diet w/ thickened liquids.  Pt required Mod verbal/visual/tactile cues for follow through during po tasks and w/ feeding.        Pt consumed several trials of Nectar liquids and purees post oral care(dried debris noted on teeth). No immediate, overt s/s of aspiration noted the trials; no overt coughing, decline in respiratory status, nor wet vocal quality b/t trials. Pt exhibited decreased oral prep awareness and discoordination of self-feeding but overall bolus management once it was in her mouth was adequate.  Oral phase was adequate for bolus management and oral clearing was functional alternating foods/liquids to aid clearing -- intermittently checked for full clearing.   Pt required feeding and cues  for guidance d/t the Cognitive decline. Hand over hand guidance and visual cues were helpful during tasks.    In setting of Chronic illness/hospitalization, weakness and deconditioning, and Cognitive decline presentation, risk for aspiration is increased currently. Pt required full support w/ feeding also.   Recommend the dysphagia level 1(PUREED foods) diet moistened for ease of oral phase w/ NECTAR liquids via Cup; general aspiration precautions; reduce Distractions during meals and engage pt during meals for self-feeding as able to reduce risk for aspiration(liquids esp.). Pills Crushed vs Whole in Puree for safer swallowing as needed. Support w/ feeding at meals and ensure oral clearing. Frequent oral care. MD/NSG updated.    ST services will follow pt during admit for ongoing assessment. Pt may benefit from Cognitive screening/assessment to determine strengths/weaknesses to better support POC re: her ADLs, safety awareness. Recommend f/u w/ Palliative Care for [place] and support moving forward. Precautions posted in room. SLP Visit Diagnosis: Dysphagia, oropharyngeal phase (R13.12);Cognitive communication deficit (R41.841) (appears similar to last BSE/tx sessions during recent admit)    Aspiration Risk  Moderate aspiration risk;Risk for inadequate nutrition/hydration (reduced following precs; modified diet)    Diet Recommendation   dysphagia level 1(PUREED foods) diet moistened for ease of oral phase w/  NECTAR liquids via Cup; general aspiration precautions; reduce Distractions during meals and engage pt during meals for self-feeding as able to reduce risk for aspiration(liquids esp.). Support w/ feeding at meals and ensure oral clearing. Frequent oral care.  Medication Administration: Whole meds with puree (vs need to CRUSH in Puree)    Other  Recommendations Recommended Consults:  (Palliative Care consult for [place] d/t quick readmit; Dietician f/u) Oral Care Recommendations: Oral care BID;Oral  care before and after PO;Staff/trained caregiver to provide oral care Other Recommendations: Order thickener from pharmacy;Prohibited food (jello, ice cream, thin soups);Remove water pitcher;Have oral suction available    Recommendations for follow up therapy are one component of a multi-disciplinary discharge planning process, led by the attending physician.  Recommendations may be updated based on patient status, additional functional criteria and insurance authorization.  Follow up Recommendations Skilled nursing-short term rehab (<3 hours/day)      Assistance Recommended at Discharge Frequent or constant Supervision/Assistance (w/ tray setup/feeding support d/t Cognitive decline)  Functional Status Assessment Patient has had a recent decline in their functional status and/or demonstrates limited ability to make significant improvements in function in a reasonable and predictable amount of time  Frequency and Duration min 2x/week  1 week       Prognosis Prognosis for Safe Diet Advancement: Fair Barriers to Reach Goals: Cognitive deficits;Language deficits;Time post onset;Severity of deficits      Swallow Study   General Date of Onset: 02/13/22 HPI: Pt is a 81 y.o. female with medical history significant of essential hypertension, Anxiety disorder, previous history of basal cell carcinoma, Hiatal Hernia per chart, GERD and kidney stones.  She had a recent mechanical fall complicated by right intertrochanteric fracture s/p surgical intervention 01/24/2022 and was discharged to SNF for Rehab on 02/09/22.  She presents to the ED with altered mental status.  Recent hospital stay was complicated by Klebsiella pneumonia bacteremia, acute lower extremity DVT, acute embolic stroke and acute urinary retention.  She was discharged to SNF with plan to complete 6 weeks of IV ceftriaxone.     Per EMS report, SNF staff concerned for altered mental status and possible underlying infection of surgical  staples.   Lst admit, pt found to have acute/subacute R parietal infarct per MRI.  Head CT this admit: stable atrophy and white matter disease. This likely reflects the  sequela of chronic microvascular ischemia.  No acute intracranial abnormality or significant interval change. Type of Study: Bedside Swallow Evaluation Previous Swallow Assessment: BSE 02/01/22 Diet Prior to this Study: Dysphagia 2 (chopped);Nectar-thick liquids (at d/c) Temperature Spikes Noted: No (wbc 7.9) Respiratory Status: Room air History of Recent Intubation: No Behavior/Cognition: Alert;Cooperative;Pleasant mood;Confused;Distractible;Requires cueing (anxious) Oral Cavity Assessment: Dry;Dried secretions Oral Care Completed by SLP: Yes Oral Cavity - Dentition: Missing dentition;Adequate natural dentition (few) Vision: Functional for self-feeding Self-Feeding Abilities: Able to feed self;Needs assist;Needs set up;Total assist (Cognitive decline) Patient Positioning: Upright in bed (needed full positioning support) Baseline Vocal Quality: Normal;Low vocal intensity (min -- muttered speech at times) Volitional Cough: Strong Volitional Swallow: Unable to elicit (confusion)    Oral/Motor/Sensory Function Overall Oral Motor/Sensory Function: Generalized oral weakness (no unilateral weakness)   Ice Chips Ice chips: Not tested   Thin Liquid Thin Liquid: Not tested    Nectar Thick Nectar Thick Liquid: Impaired (decreased awareness during self-feeding) Presentation: Cup;Self Fed;Spoon (supported; ~3 ozs) Oral Phase Impairments:  (adequate) Pharyngeal Phase Impairments:  (none)   Honey Thick Honey Thick Liquid: Not tested   Puree Puree: Impaired (min;  intermittent - decreased attention during task) Presentation: Spoon (fed; 15 trials) Oral Phase Impairments:  (adequate timeliness and oral clearing) Pharyngeal Phase Impairments:  (none)   Solid     Solid: Not tested         Orinda Kenner, MS, CCC-SLP Speech  Language Pathologist Rehab Services; [place] [phone] (ascom) Harutyun [place]verde 02/15/2022,3:40 PM

## 2022-02-15 NOTE — Progress Notes (Signed)
[place] for Heparin Drip transition back to Apixaban Indication: DVT  (pt on apixaban PTA for recent DVT (s/p surgery) but too lethargic to take oral med)  Patient Measurements: Height: 5' (152.4 cm) Weight: 46.1 kg (101 lb 10.1 oz) IBW/kg (Calculated) : 45.5 Heparin Dosing Weight: 46.1 kg  Labs: Recent Labs    02/13/22 1606 02/14/22 0513 02/14/22 1622 02/15/22 0220 02/15/22 1040  HGB 11.2* 9.7*  --  11.3*  --   HCT 36.7 30.9*  --  37.7  --   PLT 517* 355  --  373  --   APTT 35  --  110* 91* 109*  LABPROT 20.1*  --   --   --   --   INR 1.7*  --   --   --   --   HEPARINUNFRC  --   --  >1.10* >1.10* >1.10*  CREATININE 1.43* 1.15*  --  1.04*  --   TROPONINIHS 15  --   --   --   --      Estimated Creatinine Clearance: 30 mL/min (A) (by C-G formula based on SCr of 1.04 mg/dL (H)).   Medical History: Past Medical History:  Diagnosis Date   Anxiety    Cancer ([place]) 2015   basal cell removed from leg   Depression    Episodic mood disorder ([place])    mostly anxiety--depression in the past   Gastric ulcer    History   GERD (gastroesophageal reflux disease)    History of blood transfusion    At [facility] appro 15 yrs ago   History of hiatal hernia    History of kidney stones 1960   Hypertension    Malignant melanoma ([place]) 07/14/2014   Left proximal post lat thigh. Breslow's 0.58m, Anatomic level II. Excised 08/03/2014   Osteoarthritis, hand    hand   Postmenopausal disorder    SVD (spontaneous vaginal delivery)    x 3   Assessment: 81yo F to start heparin drip for DVT treatment as patient is too lethargic to take oral apixaban (PTA med).   Per H&P: recent admission 10/5-10/20/23. Acute deep vein thrombosis (DVT) of tibial vein of both lower extremities (HCC) from that admission. Provoked DVT in the setting of fracture and prolonged hospitalization and was discharged on apixaban  Last dose of apixaban per Med Rec  was 02/13/22 @ 0900. Hgb 9.7  Plt 355   (10/24: INR=1.7 (note this is affected by apixaban), aPTT 35)  1025'@1622'$ : aPTT 110s, supra; 800 un/hr 1026'@0220'$ : aPTT 91s/HL>1.10, thera x 1; 750 un/hr 1026'@1040'$ : aPTT 109/HL >1.10, supratherapeutic at 750 units/hr  Goal of Therapy:  Heparin level 0.3-0.7 units/ml Monitor platelets by anticoagulation protocol: Yes   Plan:  --Will plan to restart Apixaban '5mg'$  bid with evening dose --Discontinue Heparin drip prior to restarting Apixaban  LPaulina Fusi PharmD, BCPS 02/15/2022 3:24 PM

## 2022-02-15 NOTE — Progress Notes (Signed)
Received message from [place] who stated when she spoke with patient's son he did not want her returning to [facility] due to concerns of "neglect." Message relayed to [place] with social work.

## 2022-02-15 NOTE — Evaluation (Signed)
Physical Therapy Evaluation Patient Details Name: Katie Bell MRN: [id number] DOB: Feb 18, 1941 Today's Date: 02/15/2022  History of Present Illness  Katie Bell is a 81 y.o. female with medical history significant of hypertension, anxiety, previous nephrolithiasis and recent mechanical fall complicated by right intertrochanteric fracture s/p surgical intervention who presents to the ED with altered mental status.  Given patient's AMS, history obtained through chart review discussing with patient's son, Katie Bell.     Recently admitted from 10/05 - 10/20 after a mechanical fall that was complicated by right intertrochanteric fracture s/p surgical repair.  Hospital stay was complicated by Klebsiella pneumonia bacteremia, acute lower extremity DVT, acute embolic stroke and acute urinary retention.  She was discharged to SNF with plan to complete 6 weeks of IV ceftriaxone.     Per EMS report, SNF staff concerned for altered mental status and possible underlying infection of surgical staples.  I was unable to reach patient's SNF.  I discussed with patient's son Katie Bell who states patient has been doing well the last couple days and was even able to walk some yesterday.  Katie Bell has been complaining that she is not getting enough water at the facility and Katie Bell has noticed that her appetite has gone down.  Clinical Impression  Pt received in bed with IV in place. Alert with VC and then improved to remaining alert through out the session. Pt became communicative with improved phonics with time and able to follow one step commands.  Pt w/o c/o  pain. Pt participated with slow and simple VC to provide processing time. PT reached out to the Son and found that he would like [facility]is mom at [facility] due to proximity form his home and better care. ( Said No to [facility])  AS per Son pt was Ind with household level ambualtion w/o AD and Independent with Adls and IADls.Pt demonstrates weakness in BLE R>L and poor standing  balance. Sitting balance improved with time from poor to fair +. Pt STS and Bed<->Chair with max assist with 10% pt participation. Unable achieve upright posture. Pt demonstrates decreased attention to L side which she compensates with R side vision and head turns.  Pt is thirsty and asked for drink through out the session. SLP consult will benefit the pt. Pt Nurse present and PT discussed mobility status. Pt tol session well. Pt will continue in acute an d pt will benefit form SNF after acute.      Recommendations for follow up therapy are one component of a multi-disciplinary discharge planning process, led by the attending physician.  Recommendations may be updated based on patient status, additional functional criteria and insurance authorization.  Follow Up Recommendations Skilled nursing-short term rehab (<3 hours/day) Can patient physically be transported by private vehicle: No    Assistance Recommended at Discharge Frequent or constant Supervision/Assistance  Patient can return home with the following  A lot of help with walking and/or transfers;Two people to help with bathing/dressing/bathroom;Assistance with feeding;Direct supervision/assist for medications management;Direct supervision/assist for financial management;Assist for transportation;Help with stairs or ramp for entrance    Equipment Recommendations None recommended by PT  Recommendations for Other Services  Speech consult    Functional Status Assessment Patient has had a recent decline in their functional status and demonstrates the ability to make significant improvements in function in a reasonable and predictable amount of time.     Precautions / Restrictions Precautions Precautions: Fall Restrictions Weight Bearing Restrictions: Yes RLE Weight Bearing: Weight bearing as tolerated  Mobility  Bed Mobility Overal bed mobility: Needs Assistance Bed Mobility: Supine to Sit Rolling: Max assist   Supine to  sit: Max assist          Transfers Overall transfer level: Needs assistance Equipment used: None Transfers: Sit to/from Stand, Bed to chair/wheelchair/BSC Sit to Stand: Max assist (pt helps) Stand pivot transfers: Max assist         General transfer comment: Hug dependet transfer with cues for direction and hand and foot placement.    Ambulation/Gait: Unable                  Stairs: unable            Wheelchair Mobility    Modified Rankin (Stroke Patients Only)       Balance Overall balance assessment: Needs assistance Sitting-balance support: Bilateral upper extremity supported, Feet supported Sitting balance-Leahy Scale: Fair Sitting balance - Comments: Poor in the bigenning but imporves with time. Postural control: Left lateral lean, Posterior lean   Standing balance-Leahy Scale: Poor Standing balance comment: forward lean and hugs to stand without achieveing upright posture.                             Pertinent Vitals/Pain Pain Assessment Pain Assessment: No/denies pain    Home Living Family/patient expects to be discharged to:: Skilled nursing facility (to become stronger before D/C to home.) Living Arrangements: Children Available Help at Discharge: Available PRN/intermittently;Family (Son works part time so gone 2 to 3 days during the day.) Type of Home: House Home Access: Stairs to enter Entrance Stairs-Rails: Psychiatric nurse of Steps: [place]: One level [place]: None      Prior Function Prior Level of Function : Patient poor historian/Family not available             Mobility Comments: As per Son: pt lived with her until recent fall and  ORIF to R hip.  she was transferred to [facility] for rehab. Pt prior to all this, was Ind at [facility] level ambulation without AD, Independent  with taking medicaitons, bathing, dressing, eating and toileting. Son does all the cooking or meal  planning. ADLs Comments: MOD I with ADL/IADL at baseline per chart review     Hand Dominance   Dominant Hand: Right    Extremity/Trunk Assessment   Upper Extremity Assessment Upper Extremity Assessment: Defer to OT evaluation    Lower Extremity Assessment Lower Extremity Assessment: Generalized weakness;RLE deficits/detail RLE Deficits / Details: 2-/5 MMT, unable assess for sensation and coordination due to cognition impairments.       Communication   Communication: Expressive difficulties  Cognition Arousal/Alertness: Awake/alert Behavior During Therapy: Restless, Flat affect (becasue ot is thirsty.) Overall Cognitive Status: Impaired/Different from baseline Area of Impairment: Orientation, Attention, Safety/judgement, Awareness, Problem solving, Memory                 Orientation Level: Disoriented to, Place, Time, Situation, Person (oriented to self.) Current Attention Level: Alternating Memory: Decreased recall of precautions, Decreased short-term memory Following Commands: Follows one step commands consistently Safety/Judgement: Decreased awareness of safety, Decreased awareness of deficits Awareness: Anticipatory Problem Solving: Requires verbal cues, Difficulty sequencing, Decreased initiation, Slow processing General Comments: pt oriented to self, able to follow simple commands with intermittent VC,requiring consistent reassurance/encouragement        General Comments      Exercises  Pt performed seated reaching activities fro 2  mins   Assessment/Plan    PT Assessment Patient needs continued PT services  PT Problem List Decreased range of motion;Decreased strength;Decreased activity tolerance;Decreased balance;Decreased mobility;Decreased safety awareness;Decreased knowledge of use of DME;Decreased cognition       PT Treatment Interventions DME instruction;Balance training;Gait training;Stair training;Cognitive remediation;Functional mobility  training;Patient/family education;Therapeutic activities;Therapeutic exercise    PT Goals (Current goals can be found in the Care Plan section)  Acute Rehab PT Goals Patient Stated Goal: unable PT Goal Formulation: With family Time For Goal Achievement: 02/14/22 Potential to Achieve Goals: Fair    Frequency Min 2X/week     Co-evaluation               AM-PAC PT "6 Clicks" Mobility  Outcome Measure Help needed turning from your back to your side while in a flat bed without using bedrails?: A Lot Help needed moving from lying on your back to sitting on the side of a flat bed without using bedrails?: A Lot Help needed moving to and from a bed to a chair (including a wheelchair)?: A Lot Help needed standing up from a chair using your arms (e.g., wheelchair or bedside chair)?: A Lot Help needed to walk in hospital room?: Total Help needed climbing 3-5 steps with a railing? : Total 6 Click Score: 10    End of Session Equipment Utilized During Treatment: Gait belt Activity Tolerance: Patient tolerated treatment well;No increased pain Patient left: in chair;with call bell/phone within reach;with chair alarm set;with nursing/sitter in room Nurse Communication: Mobility status PT Visit Diagnosis: Muscle weakness (generalized) (M62.81);History of falling (Z91.81)    Time: 0815-0909 PT Time Calculation (min) (ACUTE ONLY): 54 min   Charges:   PT Evaluation $PT Eval Moderate Complexity: 1 Mod PT Treatments $Gait Training: 8-22 mins $Therapeutic Activity: 8-22 mins       Marieli Rudy PT DPT 9:55 AM,02/15/22

## 2022-02-15 NOTE — Consult Note (Signed)
   [facility] Inpatient Consult   02/15/2022  Katie Bell 1940-11-07 [id number]  [facility] [ACO] Patient:  [name] Liaison remote coverage for [name]  Primary Care Provider:  Venia Carbon, MD, [place] at [facility] which is listed to provide the transition of care follow up.  Patient screened for less than 7 days readmission hospitalization with noted medium risk score for unplanned readmission risk and to assess for potential [place] service needs for post hospital transition.  Review of patient's medical record reveals patient is being recommended to return to [facility] for ongoing rehab.   Plan:  Continue to follow progress and disposition to assess for post hospital care management needs.  If patient returns to a skilled nursing facility level of care then her transitional care needs are to be met at the facility.  For questions contact:   Katie Brood, RN BSN [place]  [phone] business mobile phone Toll free office [phone]  *[place]  [phone] Fax number: [phone] [email or web address]

## 2022-02-16 DIAGNOSIS — G9340 Encephalopathy, unspecified: Secondary | ICD-10-CM | POA: Diagnosis not present

## 2022-02-16 DIAGNOSIS — Z8673 Personal history of transient ischemic attack (TIA), and cerebral infarction without residual deficits: Secondary | ICD-10-CM

## 2022-02-16 DIAGNOSIS — E870 Hyperosmolality and hypernatremia: Secondary | ICD-10-CM | POA: Diagnosis not present

## 2022-02-16 DIAGNOSIS — N179 Acute kidney failure, unspecified: Secondary | ICD-10-CM | POA: Diagnosis not present

## 2022-02-16 DIAGNOSIS — R8271 Bacteriuria: Secondary | ICD-10-CM

## 2022-02-16 DIAGNOSIS — R338 Other retention of urine: Secondary | ICD-10-CM | POA: Diagnosis not present

## 2022-02-16 LAB — BASIC METABOLIC PANEL
Anion gap: 8 (ref 5–15)
BUN: 17 mg/dL (ref 8–23)
CO2: 25 mmol/L (ref 22–32)
Calcium: 8.9 mg/dL (ref 8.9–10.3)
Chloride: 116 mmol/L — ABNORMAL HIGH (ref 98–111)
Creatinine, Ser: 0.73 mg/dL (ref 0.44–1.00)
GFR, Estimated: >60 mL/min (ref >60)
Glucose, Bld: 105 mg/dL — ABNORMAL HIGH (ref 70–99)
Potassium: 3.7 mmol/L (ref 3.5–5.1)
Sodium: 149 mmol/L — ABNORMAL HIGH (ref 135–145)

## 2022-02-16 LAB — CBC
HCT: 30.7 % — ABNORMAL LOW (ref 36.0–46.0)
Hemoglobin: 9.6 g/dL — ABNORMAL LOW (ref 12.0–15.0)
MCH: 32 pg (ref 26.0–34.0)
MCHC: 31.3 g/dL (ref 30.0–36.0)
MCV: 102.3 fL — ABNORMAL HIGH (ref 80.0–100.0)
Platelets: 281 10*3/uL (ref 150–400)
RBC: 3 MIL/uL — ABNORMAL LOW (ref 3.87–5.11)
RDW: 14 % (ref 11.5–15.5)
WBC: 6.8 10*3/uL (ref 4.0–10.5)
nRBC: 0 % (ref 0.0–0.2)

## 2022-02-16 LAB — MAGNESIUM: Magnesium: 2.1 mg/dL (ref 1.7–2.4)

## 2022-02-16 MED ORDER — THIAMINE MONONITRATE 100 MG PO TABS
100.0000 mg | ORAL_TABLET | Freq: Every day | ORAL | Status: DC
  Administered 2022-02-16 – 2022-02-20 (×5): 100 mg via ORAL
  Filled 2022-02-16 (×5): qty 1

## 2022-02-16 MED ORDER — FOLIC ACID 1 MG PO TABS
1.0000 mg | ORAL_TABLET | Freq: Every day | ORAL | Status: DC
  Administered 2022-02-16 – 2022-02-20 (×5): 1 mg via ORAL
  Filled 2022-02-16 (×5): qty 1

## 2022-02-16 NOTE — Plan of Care (Signed)

## 2022-02-16 NOTE — Assessment & Plan Note (Signed)
VRE in urine is fecal contamination/colonization --Per ID, hold antibiotic as no evidence of systemic infection

## 2022-02-16 NOTE — Progress Notes (Addendum)
ID Pt remains confused Visual hallucination Responds to  some questions appropriately in between Not oriented in place, time  O/e Vitals Patient Vitals for the past 24 hrs:  BP Temp Temp src Pulse Resp SpO2  02/16/22 1615 (!) 119/52 99.1 F (37.3 C) Oral 98 16 97 %  02/16/22 0730 (!) 141/70 98.8 F (37.1 C) -- 89 16 100 %  02/16/22 0550 (!) 149/79 98.3 F (36.8 C) Oral 90 18 100 %  02/15/22 2101 101/81 97.6 F (36.4 C) Oral 100 16 99 %  02/15/22 1627 (!) 152/80 (!) 97.1 F (36.2 C) -- 83 19 99 %   Chest b/l air entry Rt hip surgical site healing well Moves all limbs Foley cath Rt PICc  Labs    Latest Ref Rng & Units 02/16/2022    3:52 AM 02/15/2022    2:20 AM 02/14/2022    5:13 AM  CBC  WBC 4.0 - 10.5 K/uL 6.8  7.9  9.0   Hemoglobin 12.0 - 15.0 g/dL 9.6  11.3  9.7   Hematocrit 36.0 - 46.0 % 30.7  37.7  30.9   Platelets 150 - 400 K/uL 281  373  355        Latest Ref Rng & Units 02/16/2022    3:52 AM 02/15/2022    2:20 AM 02/14/2022    5:13 AM  CMP  Glucose 70 - 99 mg/dL 105  77  97   BUN 8 - 23 mg/dL '17  25  31   '$ Creatinine 0.44 - 1.00 mg/dL 0.73  1.04  1.15   Sodium 135 - 145 mmol/L 149  152  152   Potassium 3.5 - 5.1 mmol/L 3.7  3.3  3.6   Chloride 98 - 111 mmol/L 116  117  123   CO2 22 - 32 mmol/L '25  23  22   '$ Calcium 8.9 - 10.3 mg/dL 8.9  9.1  8.9      Impression/recommendation  Encephalopathy with baseline confusion and cva-now with dehydration, hypernatremia and urinary retention  has improved  Urinary retention foley VRE in the urine is contaminant No antibiotic needed  AKI resolved  Recent klebsiella bacteremia for which she is getting  IV ceftriaxone until 03/12/22 as we are treating like possible endocarditis because of embolic stroke and not able to do TEE  Recent rt femur fracture- S/p intramedullary nailing on 02/25/22 That hospitalization complicated by CVA, b/l DVT Surgical site healing nicely Ecchymosis resolved  H/o Fall   OPAT  Orders Discharge antibiotics: Ceftriaxone 2 grams IV every 24 hours  6 weeks End Date: 03/12/22   [facility] Care Per Protocol:   Labs weekly on Monday while on IV antibiotics: _X_ CBC with differential   _X_ CMP _   _X_ Please pull PIC at completion of IV antibiotics     Fax weekly lab results  promptly to [phone]   Clinic Follow Up Appt: [place] visit 02/22/22 at 10.35Am     Call [phone] with any questions  ID will sign off- call if needed

## 2022-02-16 NOTE — Progress Notes (Signed)
OT Cancellation Note  Patient Details Name: Katie Bell MRN: [id number] DOB: Jan 12, 1941   Cancelled Treatment:    Reason Eval/Treat Not Completed: Other (comment) (pt recieving nursing care when attempting 2x this morning. OT will reattempt as able.Shanon Payor, OTD OTR/L  02/16/22, 1:36 PM

## 2022-02-16 NOTE — Assessment & Plan Note (Signed)
--  embolic strokes- cardiac source of thrombus/vegetaion questioned. --cont Eliquis

## 2022-02-16 NOTE — Progress Notes (Signed)
Occupational Therapy Treatment Patient Details Name: Katie Bell MRN: [id number] DOB: [birth year] Today's Date: 02/16/2022   History of present illness Katie Bell is a 81 y.o. female with medical history significant of hypertension, anxiety, previous nephrolithiasis and recent mechanical fall complicated by right intertrochanteric fracture s/p surgical intervention who presents to the ED with altered mental status.  Given patient's AMS, history obtained through chart review discussing with patient's son, Katie Bell.     Recently admitted from 10/05 - 10/20 after a mechanical fall that was complicated by right intertrochanteric fracture s/p surgical repair.  Hospital stay was complicated by Klebsiella pneumonia bacteremia, acute lower extremity DVT, acute embolic stroke and acute urinary retention.  She was discharged to SNF with plan to complete 6 weeks of IV ceftriaxone.     Per EMS report, SNF staff concerned for altered mental status and possible underlying infection of surgical staples.  I was unable to reach patient's SNF.  I discussed with patient's son Katie Bell who states patient has been doing well the last couple days and was even able to walk some yesterday.  Katie Bell has been complaining that she is not getting enough water at the facility and Katie Bell has noticed that her appetite has gone down.   OT comments  Chart reviewed, pt greeted  in bed, oriented to self only. Increased time required for one step directives, processing. Tx session targeted improving functional activity tolerance and static/dynamic sitting balance in preparation for ADL tasks. Bed mobility completed with MAX A +1, STS with MAX A +1 with RW 2 attempts with MAX A to maintain standing, pt with posterior bias for no more than 15 seconds. Pt is able to sit on edge of bed to consume liquids based on prescribed diet with supervision, intermittent vcs for technique. Grooming tasks completed with MIN A. Pt then stating "I am so  done please just let me rest". TOTAL A for boost up the bed +1. OT will continue to follow acutely.    Recommendations for follow up therapy are one component of a multi-disciplinary discharge planning process, led by the attending physician.  Recommendations may be updated based on patient status, additional functional criteria and insurance authorization.    Follow Up Recommendations  Skilled nursing-short term rehab (<3 hours/day)    Assistance Recommended at Discharge Frequent or constant Supervision/Assistance  Patient can return home with the following  A lot of help with walking and/or transfers;A lot of help with bathing/dressing/bathroom;Assistance with cooking/housework;Assist for transportation;Help with stairs or ramp for entrance;Assistance with feeding;Direct supervision/assist for medications management;Direct supervision/assist for financial management   Equipment Recommendations  Other (comment) (defer)    Recommendations for Other Services      Precautions / Restrictions Precautions Precautions: Fall Restrictions Weight Bearing Restrictions: Yes RLE Weight Bearing: Weight bearing as tolerated       Mobility Bed Mobility Overal bed mobility: Needs Assistance Bed Mobility: Supine to Sit, Sit to Supine     Supine to sit: Max assist, HOB elevated Sit to supine: Max assist        Transfers Overall transfer level: Needs assistance Equipment used: Rolling walker (2 wheels) Transfers: Sit to/from Stand Sit to Stand: Max assist                 Balance Overall balance assessment: Needs assistance Sitting-balance support: Bilateral upper extremity supported, Feet supported Sitting balance-Leahy Scale: Fair       Standing balance-Leahy Scale: Poor  ADL either performed or assessed with clinical judgement   ADL Overall ADL's : Needs assistance/impaired Eating/Feeding: Sitting;Set up                    Lower Body Dressing: Maximal assistance Lower Body Dressing Details (indicate cue type and reason): socks               General ADL Comments: STS with MAX A +1 with RW 2 attempts, MAX A to maintain static sitting, step by step vcs for all task completion    Extremity/Trunk Assessment              Vision       Perception     Praxis      Cognition Arousal/Alertness: Awake/alert Behavior During Therapy: Flat affect Overall Cognitive Status: Impaired/Different from baseline Area of Impairment: Orientation, Attention, Safety/judgement, Awareness, Problem solving, Memory, Following commands                 Orientation Level: Disoriented to, Place, Time, Situation, Person Current Attention Level: Selective Memory: Decreased recall of precautions, Decreased short-term memory Following Commands: Follows one step commands inconsistently Safety/Judgement: Decreased awareness of safety, Decreased awareness of deficits Awareness: Emergent Problem Solving: Requires verbal cues, Difficulty sequencing, Decreased initiation, Slow processing          Exercises      Shoulder Instructions       General Comments      Pertinent Vitals/ Pain       Pain Assessment Pain Assessment: Faces Faces Pain Scale: Hurts a little bit Pain Location: right hip Pain Descriptors / Indicators: Aching, Discomfort Pain Intervention(s): Limited activity within patient's tolerance, Monitored during session, Repositioned  Home Living                                          Prior Functioning/Environment              Frequency           Progress Toward Goals  OT Goals(current goals can now be found in the care plan section)  Progress towards OT goals: Progressing toward goals     Plan Discharge plan remains appropriate;Frequency remains appropriate    Co-evaluation                 AM-PAC OT "6 Clicks" Daily Activity     Outcome Measure    Help from another person eating meals?: A Little Help from another person taking care of personal grooming?: A Lot Help from another person toileting, which includes using toliet, bedpan, or urinal?: Total Help from another person bathing (including washing, rinsing, drying)?: A Lot Help from another person to put on and taking off regular upper body clothing?: A Lot Help from another person to put on and taking off regular lower body clothing?: A Lot 6 Click Score: 12    End of Session Equipment Utilized During Treatment: Rolling walker (2 wheels)  OT Visit Diagnosis: Other abnormalities of gait and mobility (R26.89);Muscle weakness (generalized) (M62.81);History of falling (Z91.81);Pain   Activity Tolerance Patient tolerated treatment well   Patient Left in bed;with call bell/phone within reach;with bed alarm set   Nurse Communication Mobility status        Time: 1440-1452 OT Time Calculation (min): 12 min  Charges: OT General Charges $OT Visit: 1 Visit OT Treatments $Therapeutic Activity: 8-22 mins  Shanon Payor, OTD OTR/L  02/16/22, 3:11 PM

## 2022-02-16 NOTE — Progress Notes (Addendum)
PROGRESS NOTE    Katie Bell  EZM:[id number] DOB: Sep 30, 1940 DOA: 02/13/2022 PCP: Venia Carbon, MD  102A/102A-AA  LOS: 2 days   Brief hospital course:   Assessment & Plan: Katie Bell is a 81 y.o. female with medical history significant of hypertension, anxiety, previous nephrolithiasis and recent mechanical fall complicated by right intertrochanteric fracture s/p surgical intervention who presents to the ED with altered mental status.  Given patient's AMS, history obtained through chart review discussing with patient's son, [name].   Recently admitted from 10/05 - 10/20 after a mechanical fall that was complicated by right intertrochanteric fracture s/p surgical repair.  Hospital stay was complicated by Klebsiella pneumonia bacteremia, acute lower extremity DVT, acute embolic stroke and acute urinary retention.  She was discharged to SNF with plan to complete 6 weeks of IV ceftriaxone.   * Acute metabolic encephalopathy Patient presenting with acute encephalopathy of 1 day duration.  I suspect patient's encephalopathy secondary to urinary retention and hypernatremia and dehydration.   --seems to have baseline dementia or cognitive decline --mental status improving gradually  Acute urinary retention Recent hospitalization during which patient experienced urinary retention with Foley removal on day of discharge.  Presenting with approximately 1.2 L of output on catheter insertion.   Plan: --cont Foley for now  Hypernatremia Hypernatremia up to 150 on admission.  Per patient's son [name], patient has not been drinking fluids consistently at [facility].  Pt appeared dry on exam. - IV fluid resuscitation with LR --Na did not improve on 1/2 NS infusion, so switched to D5 Plan: --cont D5'@50'$   AKI (acute kidney injury) ([place]) --Cr 1.43 on presentation.  Baseline around 0.8.  Secondary to urinary retention and dehydration. --s/p IVF Plan: --cont MIVF  Sinus tachycardia Low suspicion  for new infection, suspect tachycardia secondary to dehydration and encephalopathy. Plan: --no need for tele  Leucocytosis Resolved the next day.  Bacteremia due to Klebsiella pneumoniae Recent klebsiella bacteremia for which she is getting  IV ceftriaxone until 03/12/22 as we are treating like possible endocarditis because of embolic stroke and not able to do TEE  Acute deep vein thrombosis (DVT) of tibial vein of both lower extremities (HCC) Provoked DVT in the setting of recent fracture and prolonged hospitalization. --heparin gtt while NPO Plan: --cont Eliquis  Recent cerebrovascular accident (CVA) --embolic strokes- cardiac source of thrombus/vegetaion questioned. --cont Eliquis  Bacteriuria VRE in urine is fecal contamination/colonization --Per ID, hold antibiotic as no evidence of systemic infection  Protein-calorie malnutrition, severe --supplements per dietician   DVT prophylaxis: TK:[id number] Code Status: Full code  Family Communication: son updated on the phone today Level of care: Med-Surg Dispo:   The patient is from: SNF rehab Anticipated d/c is to: SNF rehab Anticipated d/c date is: 2-3 days Patient currently is not medically ready to d/c due to: lethargic, on D5 for hypernatremia   Subjective and Interval History:  Pt was more alert and coherent this morning.  Kept saying she felt so bad.  Complained of abdominal and back pain.     Objective: Vitals:   02/15/22 2101 02/16/22 0550 02/16/22 0730 02/16/22 1615  BP: 101/81 (!) 149/79 (!) 141/70 (!) 119/52  Pulse: 100 90 89 98  Resp: '16 18 16 16  '$ Temp: 97.6 F (36.4 C) 98.3 F (36.8 C) 98.8 F (37.1 C) 99.1 F (37.3 C)  TempSrc: Oral Oral  Oral  SpO2: 99% 100% 100% 97%  Weight:      Height:  Intake/Output Summary (Last 24 hours) at 02/16/2022 1757 Last data filed at 02/16/2022 1438 Gross per 24 hour  Intake --  Output 601 ml  Net -601 ml   Filed Weights   02/13/22 2118  Weight: 46.1  kg    Examination:   Constitutional: NAD, alert, not oriented, speech difficult to understand HEENT: conjunctivae and lids normal, EOMI, lips dry CV: No cyanosis.   RESP: normal respiratory effort, on RA Neuro: II - XII grossly intact.   Psych: anxious mood and affect.     Data Reviewed: I have personally reviewed labs and imaging studies  Time spent: 50 minutes  Enzo Bi, MD [facility] If 7PM-7AM, please contact night-coverage 02/16/2022, 5:57 PM

## 2022-02-16 NOTE — Assessment & Plan Note (Signed)
--  supplements per dietician  

## 2022-02-17 DIAGNOSIS — G9341 Metabolic encephalopathy: Secondary | ICD-10-CM | POA: Diagnosis not present

## 2022-02-17 LAB — CBC
HCT: 31.5 % — ABNORMAL LOW (ref 36.0–46.0)
Hemoglobin: 9.8 g/dL — ABNORMAL LOW (ref 12.0–15.0)
MCH: 31.1 pg (ref 26.0–34.0)
MCHC: 31.1 g/dL (ref 30.0–36.0)
MCV: 100 fL (ref 80.0–100.0)
Platelets: 240 10*3/uL (ref 150–400)
RBC: 3.15 MIL/uL — ABNORMAL LOW (ref 3.87–5.11)
RDW: 14 % (ref 11.5–15.5)
WBC: 6.4 10*3/uL (ref 4.0–10.5)
nRBC: 0 % (ref 0.0–0.2)

## 2022-02-17 LAB — BASIC METABOLIC PANEL
Anion gap: 7 (ref 5–15)
BUN: 15 mg/dL (ref 8–23)
CO2: 23 mmol/L (ref 22–32)
Calcium: 8.7 mg/dL — ABNORMAL LOW (ref 8.9–10.3)
Chloride: 116 mmol/L — ABNORMAL HIGH (ref 98–111)
Creatinine, Ser: 0.89 mg/dL (ref 0.44–1.00)
GFR, Estimated: >60 mL/min (ref >60)
Glucose, Bld: 108 mg/dL — ABNORMAL HIGH (ref 70–99)
Potassium: 3.5 mmol/L (ref 3.5–5.1)
Sodium: 146 mmol/L — ABNORMAL HIGH (ref 135–145)

## 2022-02-17 LAB — MAGNESIUM: Magnesium: 2.1 mg/dL (ref 1.7–2.4)

## 2022-02-17 NOTE — TOC Initial Note (Signed)
Transition of Care [facility]) - Initial/Assessment Note    Patient Details  Name: Katie Bell MRN: [id number] Date of Birth: May 20, 1940  Transition of [facility]) CM/SW Contact:    Katie David, RN Phone Number: 02/17/2022, 3:42 PM  Clinical Narrative:                  Patient was recently admitted to hospital 10/4-10/20 and discharged to [facility] for rehab.  Readmitted to hospital on 10/24, new recommendations for SNF for short term rehab.  Spoke with son Katie Bell, does not want patient to return to [place].  Agrees to SNF, but will only agree to send bed request to [facility] at this time.  Attempted to obtain second choice, state he would like to see if they offer a bed first. If not, he will request a second choice.  Active PASRR, FL2 completed, bed search initiated to [facility] only.    Expected Discharge Plan: Skilled Nursing Facility Barriers to Discharge: Continued Medical Work up   Patient Goals and CMS Choice Patient states their goals for this hospitalization and ongoing recovery are:: SNF for rehab short term CMS Medicare.gov Compare Post Acute Care list provided to:: Patient Represenative (must comment) (Son, Katie Bell) Choice offered to / list presented to : Adult Children  Expected Discharge Plan and Services Expected Discharge Plan: [place] Acute Care Choice: [place] Living arrangements for the past 2 months: Single Family Home                                      Prior Living Arrangements/Services Living arrangements for the past 2 months: Single Family Home Lives with:: Adult Children Patient language and need for interpreter reviewed:: Yes Do you feel safe going back to the place where you live?: Yes      Need for Family Participation in Patient Care: Yes (Comment) Care giver support system in place?: Yes (comment)   Criminal Activity/Legal Involvement Pertinent to Current  Situation/Hospitalization: No - Comment as needed  Activities of Daily Living Home Assistive Devices/Equipment: Other (Comment) (unable to answer) ADL Screening (condition at time of admission) [name] cognitive ability adequate to safely complete daily activities?: No Is the patient deaf or have difficulty hearing?: No Does the patient have difficulty seeing, even when wearing glasses/contacts?: No (unsure) Does the patient have difficulty concentrating, remembering, or making decisions?: Yes Patient able to express need for assistance with ADLs?: No Does the patient have difficulty dressing or bathing?: Yes Independently performs ADLs?: Yes (appropriate for developmental age) Communication: Needs assistance Is this a change from baseline?: Pre-admission baseline Dressing (OT): Needs assistance Is this a change from baseline?: Pre-admission baseline Grooming: Needs assistance Is this a change from baseline?: Pre-admission baseline Feeding: Needs assistance Is this a change from baseline?: Pre-admission baseline Bathing: Needs assistance Is this a change from baseline?: Pre-admission baseline Toileting: Needs assistance Is this a change from baseline?: Pre-admission baseline In/Out Bed: Needs assistance Is this a change from baseline?: Pre-admission baseline Walks in Home: Needs assistance Is this a change from baseline?: Pre-admission baseline Does the patient have difficulty walking or climbing stairs?: Yes Weakness of Legs: Both Weakness of Arms/Hands: Both  Permission Sought/Granted Permission sought to share information with : Case Manager, Customer service manager Permission granted to share information with : Yes, Verbal Permission Granted  Share Information with NAME: Katie Bell, son  Permission granted to share info w AGENCY: [place] granted to share info w Contact Information: Katie Bell, son  Emotional Assessment       Orientation: : Oriented to  Self   Psych Involvement: No (comment)  Admission diagnosis:  Hypernatremia [E87.0] Acute cystitis without hematuria [N30.00] Acute encephalopathy [G93.40] AKI (acute kidney injury) ([place]) [N17.9] Altered mental status, unspecified altered mental status type [R41.82] AMS (altered mental status) [R41.82] Patient Active Problem List   Diagnosis Date Noted   Bacteriuria 02/16/2022   Recent cerebrovascular accident (CVA) 02/16/2022   AMS (altered mental status) 47/12/6281   Acute metabolic encephalopathy 66/29/4765   Acute urinary retention 02/13/2022   Sinus tachycardia 02/13/2022   Hypernatremia 02/13/2022   Acute deep vein thrombosis (DVT) of left femoral vein ([place]) 02/06/2022   Acute deep vein thrombosis (DVT) of tibial vein of both lower extremities ([place]) 02/05/2022   Protein-calorie malnutrition, severe 02/02/2022   AKI (acute kidney injury) ([place]) 02/01/2022   Hypokalemia 02/01/2022   Closed fracture of right hip ([place])    Bacteremia due to Klebsiella pneumoniae 01/31/2022   CVA (cerebral vascular accident) ([place]) 01/31/2022   Leucocytosis 01/24/2022   Fall at home, initial encounter 01/24/2022   Closed right femoral fracture ([place]) 01/24/2022   Tinnitus 08/10/2021   Malnutrition of mild degree ([place]) 10/26/2020   External hemorrhoid, bleeding 07/21/2020   Adnexal mass 07/21/2020   Left ovarian cyst 01/05/2020   Severe sepsis ([place]) 12/30/2018   Visit for preventive health examination 02/16/2014   Advanced directives, counseling/discussion 02/16/2014   Female cystocele 11/02/2013   Hypertension    Gastric ulcer    Episodic mood disorder ([place])    Osteoarthritis, multiple sites    PCP:  Katie Carbon, MD Pharmacy:   [facility] [place] [address] [place] [address] Phone: [phone] Fax: [phone]     Social Determinants of Health (SDOH) Interventions    Readmission Risk Interventions     No data to  display

## 2022-02-17 NOTE — Progress Notes (Signed)
PROGRESS NOTE    Katie Bell  [id number]:[id number] DOB: Dec 14, 1940 DOA: 02/13/2022 PCP: Venia Carbon, MD  102A/102A-AA  LOS: 3 days   Brief hospital course:   Assessment & Plan: Katie Bell is a 81 y.o. female with medical history significant of hypertension, anxiety, previous nephrolithiasis and recent mechanical fall complicated by right intertrochanteric fracture s/p surgical intervention who presents to the ED with altered mental status.  Given patient's AMS, history obtained through chart review discussing with patient's son, Katie Bell.   Recently admitted from 10/05 - 10/20 after a mechanical fall that was complicated by right intertrochanteric fracture s/p surgical repair.  Hospital stay was complicated by Klebsiella pneumonia bacteremia, acute lower extremity DVT, acute embolic stroke and acute urinary retention.  She was discharged to SNF with plan to complete 6 weeks of IV ceftriaxone.   * Acute metabolic encephalopathy Patient presenting with acute encephalopathy of 1 day duration.  I suspect patient's encephalopathy secondary to urinary retention and hypernatremia and dehydration.   --seems to have baseline dementia or cognitive decline --mental status improving gradually  Acute urinary retention Recent hospitalization during which patient experienced urinary retention with Foley removal on day of discharge.  Presenting with approximately 1.2 L of output on catheter insertion.   Plan: --cont Foley for now  Hypernatremia Hypernatremia up to 150 on admission.  Per patient's son Katie Bell, patient has not been drinking fluids consistently at [facility].  Pt appeared dry on exam. - IV fluid resuscitation with LR --Na did not improve on 1/2 NS infusion, so switched to D5 Plan: --cont D5'@50'$   AKI (acute kidney injury) ([place]) --Cr 1.43 on presentation.  Baseline around 0.8.  Secondary to urinary retention and dehydration. --s/p IVF Plan: --cont MIVF  Sinus tachycardia Low suspicion  for new infection, suspect tachycardia secondary to dehydration and encephalopathy. Plan: --no need for tele  Leucocytosis Resolved the next day.  Bacteremia due to Klebsiella pneumoniae Recent klebsiella bacteremia for which she is getting  IV ceftriaxone until 03/12/22 as we are treating like possible endocarditis because of embolic stroke and not able to do TEE  Acute deep vein thrombosis (DVT) of tibial vein of both lower extremities (HCC) Provoked DVT in the setting of recent fracture and prolonged hospitalization. --heparin gtt while NPO Plan: --cont Eliquis  Recent cerebrovascular accident (CVA) --embolic strokes- cardiac source of thrombus/vegetaion questioned. --cont Eliquis  Bacteriuria VRE in urine is fecal contamination/colonization --Per ID, hold antibiotic as no evidence of systemic infection  Protein-calorie malnutrition, severe --supplements per dietician   DVT prophylaxis: HD:[id number] Code Status: Full code  Family Communication:  Level of care: Med-Surg Dispo:   The patient is from: SNF rehab Anticipated d/c is to: SNF rehab Anticipated d/c date is: 1-2 days Patient currently is not medically ready to d/c due to: on D5 for hypernatremia   Subjective and Interval History:  No acute event.     Objective: Vitals:   02/16/22 1615 02/16/22 1959 02/17/22 0521 02/17/22 0838  BP: (!) 119/52 109/69 96/61 (!) 116/95  Pulse: 98 91 83 88  Resp: '16 18 16 17  '$ Temp: 99.1 F (37.3 C) 98.1 F (36.7 C) 98 F (36.7 C) 98.1 F (36.7 C)  TempSrc: Oral Oral    SpO2: 97% 98% 98% 97%  Weight:      Height:        Intake/Output Summary (Last 24 hours) at 02/17/2022 1429 Last data filed at 02/17/2022 0521 Gross per 24 hour  Intake 320 ml  Output 850  ml  Net -530 ml   Filed Weights   02/13/22 2118  Weight: 46.1 kg    Examination:   Constitutional: NAD, sleeping CV: No cyanosis.   RESP: normal respiratory effort, on RA SKIN: warm, dry   Data  Reviewed: I have personally reviewed labs and imaging studies  Time spent: 35 minutes  Enzo Bi, MD [facility] If 7PM-7AM, please contact night-coverage 02/17/2022, 2:29 PM

## 2022-02-17 NOTE — NC FL2 (Signed)
[place] LEVEL OF CARE SCREENING TOOL     IDENTIFICATION  Patient Name: Katie Bell Birthdate: 07-Apr-1941 Sex: female Admission Date (Current Location): 02/13/2022  [facility] and [place] Number:  Engineering geologist and Address:  [facility], [address], [place], [place] [address]      Provider Number: [id number]  Attending Physician Name and Address:  Enzo Bi, MD  Relative Name and Phone Number:  [name] [phone]    Current Level of Care: Hospital Recommended Level of Care: [place] Prior Approval Number:    Date Approved/Denied:   PASRR Number: [id number]  Discharge Plan: SNF    Current Diagnoses: Patient Active Problem List   Diagnosis Date Noted   Bacteriuria 02/16/2022   Recent cerebrovascular accident (CVA) 02/16/2022   AMS (altered mental status) 76/19/5093   Acute metabolic encephalopathy 26/71/2458   Acute urinary retention 02/13/2022   Sinus tachycardia 02/13/2022   Hypernatremia 02/13/2022   Acute deep vein thrombosis (DVT) of left femoral vein ([place]) 02/06/2022   Acute deep vein thrombosis (DVT) of tibial vein of both lower extremities ([place]) 02/05/2022   Protein-calorie malnutrition, severe 02/02/2022   AKI (acute kidney injury) ([place]) 02/01/2022   Hypokalemia 02/01/2022   Closed fracture of right hip ([place])    Bacteremia due to Klebsiella pneumoniae 01/31/2022   CVA (cerebral vascular accident) ([place]) 01/31/2022   Leucocytosis 01/24/2022   Fall at home, initial encounter 01/24/2022   Closed right femoral fracture ([place]) 01/24/2022   Tinnitus 08/10/2021   Malnutrition of mild degree ([place]) 10/26/2020   External hemorrhoid, bleeding 07/21/2020   Adnexal mass 07/21/2020   Left ovarian cyst 01/05/2020   Severe sepsis ([place]) 12/30/2018   Visit for preventive health examination 02/16/2014   Advanced directives, counseling/discussion 02/16/2014   Female cystocele 11/02/2013    Hypertension    Gastric ulcer    Episodic mood disorder (HCC)    Osteoarthritis, multiple sites     Orientation RESPIRATION BLADDER Height & Weight     Self  Normal Incontinent Weight: 46.1 kg Height:  5' (152.4 cm)  BEHAVIORAL SYMPTOMS/MOOD NEUROLOGICAL BOWEL NUTRITION STATUS      Continent Diet  AMBULATORY STATUS COMMUNICATION OF NEEDS Skin   Extensive Assist   Normal                       Personal Care Assistance Level of Assistance  Bathing, Feeding, Dressing Bathing Assistance: Maximum assistance Feeding assistance: Maximum assistance Dressing Assistance: Maximum assistance     Functional Limitations Info             SPECIAL CARE FACTORS FREQUENCY  PT (By licensed PT), OT (By licensed OT)     PT Frequency: 5 times a week OT Frequency: 5 times a week            Contractures Contractures Info: Not present    Additional Factors Info  Code Status, Allergies Code Status Info: Full Allergies Info: Sulfamethoxazole-trimethoprim Medium  Swelling Angioedema and pruritis per 01/01/2017 documentation in care everywhere  Estradiol Not Specified  Other (See Comments) Vaginal irritation Other reaction(s): Other (See Comments) Vaginal irritation  Morphine Not Specified   agitation  Sertraline Hcl Not Specified  Other (See Comments) Migraine headaches, severe  Morphine And Related           Current Medications (02/17/2022):  This is the current hospital active medication list Current Facility-Administered Medications  Medication Dose Route Frequency Provider Last Rate Last Admin  acetaminophen (TYLENOL) tablet 650 mg  650 mg Oral Q6H PRN Jose Persia, MD   650 mg at 02/17/22 9678   Or   acetaminophen (TYLENOL) suppository 650 mg  650 mg Rectal Q6H PRN Jose Persia, MD       amLODipine (NORVASC) tablet 5 mg  5 mg Oral Daily Jose Persia, MD   5 mg at 02/17/22 9381   apixaban (ELIQUIS) tablet 5 mg  5 mg Oral BID Vira Blanco, RPH   5 mg at 02/17/22 0175    ascorbic acid (VITAMIN C) tablet 500 mg  500 mg Oral BID Enzo Bi, MD   500 mg at 02/17/22 1025   cefTRIAXone (ROCEPHIN) 2 g in sodium chloride 0.9 % 100 mL IVPB  2 g Intravenous Q24H Jose Persia, MD 200 mL/hr at 02/17/22 1238 2 g at 02/17/22 1238   Chlorhexidine Gluconate Cloth 2 % PADS 6 each  6 each Topical Daily Jose Persia, MD   6 each at 02/17/22 0928   dextrose 5 % 1,000 mL with potassium chloride 40 mEq infusion   Intravenous Continuous Enzo Bi, MD 50 mL/hr at 02/17/22 1446 New Bag at 02/17/22 1446   feeding supplement (NEPRO CARB STEADY) liquid 237 mL  237 mL Oral TID BM Enzo Bi, MD   237 mL at 85/27/78 2423   folic acid (FOLVITE) tablet 1 mg  1 mg Oral Daily Enzo Bi, MD   1 mg at 02/17/22 5361   morphine (PF) 2 MG/ML injection 1 mg  1 mg Intravenous Q4H PRN Enzo Bi, MD   1 mg at 02/17/22 4431   multivitamin with minerals tablet 1 tablet  1 tablet Oral Daily Enzo Bi, MD   1 tablet at 02/17/22 5400   polyethylene glycol (MIRALAX / GLYCOLAX) packet 17 g  17 g Oral Daily PRN Jose Persia, MD       sodium chloride flush (NS) 0.9 % injection 3 mL  3 mL Intravenous Q12H Jose Persia, MD   3 mL at 02/17/22 8676   thiamine (VITAMIN B1) tablet 100 mg  100 mg Oral Daily Enzo Bi, MD   100 mg at 02/17/22 1950   zinc sulfate capsule 220 mg  220 mg Oral Daily Enzo Bi, MD   220 mg at 02/17/22 9326     Discharge Medications: Please see discharge summary for a list of discharge medications.  Relevant Imaging Results:  Relevant Lab Results:   Additional Information [phone]  Valente David, RN

## 2022-02-17 NOTE — Plan of Care (Signed)

## 2022-02-18 DIAGNOSIS — G9341 Metabolic encephalopathy: Secondary | ICD-10-CM | POA: Diagnosis not present

## 2022-02-18 LAB — CBC
HCT: 31.9 % — ABNORMAL LOW (ref 36.0–46.0)
Hemoglobin: 9.9 g/dL — ABNORMAL LOW (ref 12.0–15.0)
MCH: 31.5 pg (ref 26.0–34.0)
MCHC: 31 g/dL (ref 30.0–36.0)
MCV: 101.6 fL — ABNORMAL HIGH (ref 80.0–100.0)
Platelets: 221 10*3/uL (ref 150–400)
RBC: 3.14 MIL/uL — ABNORMAL LOW (ref 3.87–5.11)
RDW: 14 % (ref 11.5–15.5)
WBC: 8.1 10*3/uL (ref 4.0–10.5)
nRBC: 0 % (ref 0.0–0.2)

## 2022-02-18 LAB — BASIC METABOLIC PANEL
Anion gap: 7 (ref 5–15)
BUN: 13 mg/dL (ref 8–23)
CO2: 23 mmol/L (ref 22–32)
Calcium: 8.6 mg/dL — ABNORMAL LOW (ref 8.9–10.3)
Chloride: 111 mmol/L (ref 98–111)
Creatinine, Ser: 0.81 mg/dL (ref 0.44–1.00)
GFR, Estimated: >60 mL/min (ref >60)
Glucose, Bld: 141 mg/dL — ABNORMAL HIGH (ref 70–99)
Potassium: 3.7 mmol/L (ref 3.5–5.1)
Sodium: 141 mmol/L (ref 135–145)

## 2022-02-18 LAB — CULTURE, BLOOD (ROUTINE X 2)
Culture: NO GROWTH
Special Requests: ADEQUATE

## 2022-02-18 LAB — MAGNESIUM: Magnesium: 1.9 mg/dL (ref 1.7–2.4)

## 2022-02-18 NOTE — Plan of Care (Signed)

## 2022-02-18 NOTE — Progress Notes (Signed)
PROGRESS NOTE    Katie Bell  PPJ:[id number] DOB: 06-03-1940 DOA: 02/13/2022 PCP: Venia Carbon, MD  102A/102A-AA  LOS: 4 days   Brief hospital course:   Assessment & Plan: Katie Bell is a 81 y.o. female with medical history significant of hypertension, anxiety, previous nephrolithiasis and recent mechanical fall complicated by right intertrochanteric fracture s/p surgical intervention who presents to the ED with altered mental status.  Given patient's AMS, history obtained through chart review discussing with patient's son, Katie Bell.   Recently admitted from 10/05 - 10/20 after a mechanical fall that was complicated by right intertrochanteric fracture s/p surgical repair.  Hospital stay was complicated by Klebsiella pneumonia bacteremia, acute lower extremity DVT, acute embolic stroke and acute urinary retention.  She was discharged to SNF with plan to complete 6 weeks of IV ceftriaxone.   * Acute metabolic encephalopathy Patient presenting with acute encephalopathy of 1 day duration.  I suspect patient's encephalopathy secondary to urinary retention and hypernatremia and dehydration.   --seems to have baseline dementia or cognitive decline --mental status improving gradually  Acute urinary retention Recent hospitalization during which patient experienced urinary retention with Foley removal on day of discharge.  Presenting with approximately 1.2 L of output on catheter insertion.   Plan: --cont Foley for now  Hypernatremia Hypernatremia up to 150 on admission.  Per patient's son Katie Bell, patient has not been drinking fluids consistently at [facility].  Pt appeared dry on exam. - IV fluid resuscitation with LR --Na did not improve on 1/2 NS infusion, so switched to D5.  Na normalized today Plan: --d/c D5'@50'$   AKI (acute kidney injury) ([place]) --Cr 1.43 on presentation.  Baseline around 0.8.  Secondary to urinary retention and dehydration. --s/p IVF Plan: --d/c MIVF today,  encourage oral hydration  Sinus tachycardia Low suspicion for new infection, suspect tachycardia secondary to dehydration and encephalopathy. Plan: --no need for tele  Leucocytosis Resolved the next day.  Bacteremia due to Klebsiella pneumoniae Recent klebsiella bacteremia for which she is getting  IV ceftriaxone until 03/12/22 as we are treating like possible endocarditis because of embolic stroke and not able to do TEE  Acute deep vein thrombosis (DVT) of tibial vein of both lower extremities (HCC) Provoked DVT in the setting of recent fracture and prolonged hospitalization. --heparin gtt while NPO Plan: --cont Eliquis  Recent cerebrovascular accident (CVA) --embolic strokes- cardiac source of thrombus/vegetaion questioned. --cont Eliquis  Bacteriuria VRE in urine is fecal contamination/colonization --Per ID, hold antibiotic as no evidence of systemic infection  Protein-calorie malnutrition, severe --supplements per dietician   DVT prophylaxis: [id number] Code Status: Full code  Family Communication:  Level of care: Med-Surg Dispo:   The patient is from: SNF rehab Anticipated d/c is to: SNF rehab Anticipated d/c date is: 1-2 days Patient currently is not medically ready to d/c due to: on monitor Na off of D5 infusion   Subjective and Interval History:  Pt said she just felt bad, but couldn't be specific.  Reported areas of pain (such as head and abdomen) that was not present during rounds.     Objective: Vitals:   02/17/22 2047 02/18/22 0615 02/18/22 0950 02/18/22 1613  BP: (!) 133/56 (!) 135/59 116/84 (!) 131/97  Pulse: 93 85 92 98  Resp: '16 18 17 18  '$ Temp: 98.9 F (37.2 C) (!) 97.5 F (36.4 C) 98.7 F (37.1 C) 99.7 F (37.6 C)  TempSrc: Oral Oral    SpO2: 100% 98% 99% 97%  Weight:  Height:        Intake/Output Summary (Last 24 hours) at 02/18/2022 1703 Last data filed at 02/18/2022 1301 Gross per 24 hour  Intake 1639.29 ml  Output 1100 ml   Net 539.29 ml   Filed Weights   02/13/22 2118  Weight: 46.1 kg    Examination:   Constitutional: NAD, alert, not oriented HEENT: conjunctivae and lids normal, EOMI, dry mucosa CV: No cyanosis.   RESP: normal respiratory effort, on RA SKIN: warm, dry Neuro: II - XII grossly intact.   Psych: anxious and emotional mood and affect.     Data Reviewed: I have personally reviewed labs and imaging studies  Time spent: 35 minutes  Enzo Bi, MD [facility] If 7PM-7AM, please contact night-coverage 02/18/2022, 5:03 PM

## 2022-02-18 NOTE — Progress Notes (Addendum)
Physical Therapy Treatment Patient Details Name: Katie Bell MRN: [id number] DOB: [birth year] Today's Date: 02/18/2022   History of Present Illness Katie Bell is a 81 y.o. female admitted from SNF secondary to [place] and found to have acute metabolic encephalopathy. Of note, pt with recent mechanical fall with R femur fx s/p surgical repair (WBAT R LE). PMH including but not limited to hypertension, anxiety, previous nephrolithiasis. Recently admitted from 10/05 - 10/20 for the previously mentioned fall. Hospital stay was complicated by Klebsiella pneumonia bacteremia, acute lower extremity DVT, acute embolic stroke and acute urinary retention. She was discharged to SNF with plan to complete 6 weeks of IV ceftriaxone.    PT Comments    Session significantly limited secondary to pt reporting significant pain and "not feeling well". She frequently cried throughout the session. She required total A for repositioning in bed and did not attempt to help with any aspect of mobility today. Pt would continue to benefit from skilled physical therapy services at this time while admitted and after d/c to address the below listed limitations in order to improve overall safety and independence with functional mobility.    Recommendations for follow up therapy are one component of a multi-disciplinary discharge planning process, led by the attending physician.  Recommendations may be updated based on patient status, additional functional criteria and insurance authorization.  Follow Up Recommendations  Skilled nursing-short term rehab (<3 hours/day) Can patient physically be transported by private vehicle: No   Assistance Recommended at Discharge Frequent or constant Supervision/Assistance  Patient can return home with the following Two people to help with walking and/or transfers;A lot of help with bathing/dressing/bathroom;Assistance with cooking/housework;Assistance with feeding;Assist for  transportation   Equipment Recommendations  Other (comment) (defer to next venue of care)    Recommendations for Other Services       Precautions / Restrictions Precautions Precautions: Fall Restrictions Weight Bearing Restrictions: Yes RLE Weight Bearing: Weight bearing as tolerated     Mobility  Bed Mobility Overal bed mobility: Needs Assistance Bed Mobility: Rolling Rolling: Total assist         General bed mobility comments: pt unable to assist or participate in bed mobility; she required total assistance for repositioning to her L side in an attempt to make her more comfortable    Transfers                   General transfer comment: unable this date    Ambulation/Gait                   Stairs             Wheelchair Mobility    Modified Rankin (Stroke Patients Only)       Balance                                            Cognition Arousal/Alertness: Lethargic Behavior During Therapy: Flat affect, Anxious Overall Cognitive Status: Impaired/Different from baseline Area of Impairment: Orientation, Attention, Memory, Following commands, Safety/judgement, Awareness, Problem solving                 Orientation Level: Disoriented to, Time, Situation Current Attention Level: Sustained Memory: Decreased recall of precautions Following Commands: Follows one step commands inconsistently Safety/Judgement: Decreased awareness of safety, Decreased awareness of deficits Awareness: Intellectual Problem Solving: Slow processing, Decreased initiation, Difficulty sequencing,  Requires verbal cues, Requires tactile cues          Exercises      General Comments        Pertinent Vitals/Pain Pain Assessment Pain Assessment: Faces Faces Pain Scale: Hurts worst Pain Location: R LE and generalized Pain Descriptors / Indicators: Crying, Grimacing, Guarding Pain Intervention(s): Monitored during session,  Repositioned    Home Living                          Prior Function            PT Goals (current goals can now be found in the care plan section) Acute Rehab PT Goals PT Goal Formulation: Patient unable to participate in goal setting Time For Goal Achievement: 03/04/22 Potential to Achieve Goals: Fair Progress towards PT goals: Not progressing toward goals - comment (in significant pain, anxious, upset and crying throughout)    Frequency    Min 2X/week      PT Plan Current plan remains appropriate    Co-evaluation              AM-PAC PT "6 Clicks" Mobility   Outcome Measure  Help needed turning from your back to your side while in a flat bed without using bedrails?: Total Help needed moving from lying on your back to sitting on the side of a flat bed without using bedrails?: Total Help needed moving to and from a bed to a chair (including a wheelchair)?: Total Help needed standing up from a chair using your arms (e.g., wheelchair or bedside chair)?: Total Help needed to walk in hospital room?: Total Help needed climbing 3-5 steps with a railing? : Total 6 Click Score: 6    End of Session   Activity Tolerance: Patient limited by pain Patient left: in bed;with call bell/phone within reach;with bed alarm set Nurse Communication: Mobility status PT Visit Diagnosis: Muscle weakness (generalized) (M62.81);History of falling (Z91.81)     Time: 4765-4650 PT Time Calculation (min) (ACUTE ONLY): 14 min  Charges:  $Therapeutic Activity: 8-22 mins                     Anastasio Champion, DPT  Acute Rehabilitation Services Office [place] 02/18/2022, 9:30 AM

## 2022-02-18 NOTE — TOC Progression Note (Signed)
Transition of Care [facility]) - Progression Note    Patient Details  Name: Katie Bell MRN: [id number] Date of Birth: 1940-06-26  [facility] CM/SW Contact  Valente David, RN Phone Number: 02/18/2022, 1:58 PM  Clinical Narrative:     Per MD, patient will be medically ready for discharge within the next 1-2 days.  Call placed to son to discuss alternate facilities as bed request at [facility] is still pending.  He declines to have bed search expanded at this time, state he is still looking into other options.  Advised that [facility] team attempted to have secure plan for patient once she is ready, he again declines to expand search.  Call placed to [facility] to request patient review, message left.  Will have TOC team follow up tomorrow.  Expected Discharge Plan: [place] Barriers to Discharge: Continued Medical Work up  Expected Discharge Plan and Services Expected Discharge Plan: [place] Choice: [place] arrangements for the past 2 months: Single Family Home                                       Social Determinants of Health (SDOH) Interventions    Readmission Risk Interventions     No data to display

## 2022-02-19 DIAGNOSIS — G9341 Metabolic encephalopathy: Secondary | ICD-10-CM | POA: Diagnosis not present

## 2022-02-19 LAB — BASIC METABOLIC PANEL
Anion gap: 6 (ref 5–15)
BUN: 11 mg/dL (ref 8–23)
CO2: 24 mmol/L (ref 22–32)
Calcium: 8.5 mg/dL — ABNORMAL LOW (ref 8.9–10.3)
Chloride: 112 mmol/L — ABNORMAL HIGH (ref 98–111)
Creatinine, Ser: 0.89 mg/dL (ref 0.44–1.00)
GFR, Estimated: >60 mL/min (ref >60)
Glucose, Bld: 113 mg/dL — ABNORMAL HIGH (ref 70–99)
Potassium: 3.7 mmol/L (ref 3.5–5.1)
Sodium: 142 mmol/L (ref 135–145)

## 2022-02-19 LAB — CBC
HCT: 31.4 % — ABNORMAL LOW (ref 36.0–46.0)
Hemoglobin: 10 g/dL — ABNORMAL LOW (ref 12.0–15.0)
MCH: 31.3 pg (ref 26.0–34.0)
MCHC: 31.8 g/dL (ref 30.0–36.0)
MCV: 98.4 fL (ref 80.0–100.0)
Platelets: 183 10*3/uL (ref 150–400)
RBC: 3.19 MIL/uL — ABNORMAL LOW (ref 3.87–5.11)
RDW: 14 % (ref 11.5–15.5)
WBC: 4.9 10*3/uL (ref 4.0–10.5)
nRBC: 0 % (ref 0.0–0.2)

## 2022-02-19 LAB — MAGNESIUM: Magnesium: 1.9 mg/dL (ref 1.7–2.4)

## 2022-02-19 NOTE — Progress Notes (Signed)
PROGRESS NOTE    Katie Bell  VEL:[id number] DOB: 1941/01/27 DOA: 02/13/2022 PCP: Katie Carbon, MD  102A/102A-AA  LOS: 5 days   Brief hospital course:   Assessment & Plan: Katie Bell is a 81 y.o. female with medical history significant of hypertension, anxiety, previous nephrolithiasis and recent mechanical fall complicated by right intertrochanteric fracture s/p surgical intervention who presents to the ED with altered mental status.  Given patient's AMS, history obtained through chart review discussing with patient's son, Katie Bell.   Recently admitted from 10/05 - 10/20 after a mechanical fall that was complicated by right intertrochanteric fracture s/p surgical repair.  Hospital stay was complicated by Klebsiella pneumonia bacteremia, acute lower extremity DVT, acute embolic stroke and acute urinary retention.  She was discharged to SNF with plan to complete 6 weeks of IV ceftriaxone.   * Acute metabolic encephalopathy Patient presenting with acute encephalopathy of 1 day duration.  I suspect patient's encephalopathy secondary to urinary retention and hypernatremia and dehydration.   --seems to have baseline dementia or cognitive decline --mental status improving gradually  Acute urinary retention Recent hospitalization during which patient experienced urinary retention with Foley removal on day of discharge.  Presenting with approximately 1.2 L of output on catheter insertion.   Plan: --cont Foley for now  Hypernatremia Hypernatremia up to 150 on admission.  Per patient's son Katie Bell, patient has not been drinking fluids consistently at [facility].  Pt appeared dry on exam. - IV fluid resuscitation with LR --Na did not improve on 1/2 NS infusion, so switched to D5.  Na normalized on 10/29, D5 infusion d/c'ed. Plan: --encourage oral hydration  AKI (acute kidney injury) ([place]) --Cr 1.43 on presentation.  Baseline around 0.8.  Secondary to urinary retention and dehydration. --s/p  IVF, Cr back to baseline  Sinus tachycardia Low suspicion for new infection, suspect tachycardia secondary to dehydration and encephalopathy. Plan: --no need for tele  Leucocytosis Resolved the next day.  Bacteremia due to Klebsiella pneumoniae Recent klebsiella bacteremia for which she is getting  IV ceftriaxone until 03/12/22 as we are treating like possible endocarditis because of embolic stroke and not able to do TEE  Acute deep vein thrombosis (DVT) of tibial vein of both lower extremities (HCC) Provoked DVT in the setting of recent fracture and prolonged hospitalization. --heparin gtt while NPO Plan: --cont Eliquis  Recent cerebrovascular accident (CVA) --embolic strokes- cardiac source of thrombus/vegetaion questioned. 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   Objective: Vitals:   02/18/22 1613 02/19/22 0618 02/19/22 0700 02/19/22 1727  BP: (!) 131/97 (!) 129/58 (!) 160/85 121/86  Pulse: 98 95 90 98  Resp: '18 18 18 17  '$ Temp: 99.7 F (37.6 C) 99.8 F (37.7 C) 98.9 F (37.2 C) 98.5 F (36.9 C)  TempSrc:   Oral   SpO2: 97% 100% 97% 100%  Weight:      Height:        Intake/Output Summary (Last 24 hours) at 02/19/2022 1917 Last data filed at 02/19/2022 1333 Gross per 24 hour  Intake --  Output 800 ml  Net -800 ml  Filed Weights   02/13/22 2118  Weight: 46.1 kg    Examination:   Constitutional: NAD, sleeping but arousable CV: No cyanosis.   RESP: normal respiratory effort, on RA SKIN: warm, dry   Data  Reviewed: I have personally reviewed labs and imaging studies  Time spent: 35 minutes  Katie Bi, MD [facility] If 7PM-7AM, please contact night-coverage 02/19/2022, 7:17 PM

## 2022-02-19 NOTE — Progress Notes (Signed)
Occupational Therapy Treatment Patient Details Name: Katie Bell MRN: [id number] DOB: 1940-07-28 Today's Date: 02/19/2022   History of present illness Katie Bell is a 81 y.o. female admitted from SNF secondary to [place] and found to have acute metabolic encephalopathy. Of note, pt with recent mechanical fall with R femur fx s/p surgical repair (WBAT R LE). PMH including but not limited to hypertension, anxiety, previous nephrolithiasis. Recently admitted from 10/05 - 10/20 for the previously mentioned fall. Hospital stay was complicated by Klebsiella pneumonia bacteremia, acute lower extremity DVT, acute embolic stroke and acute urinary retention. She was discharged to SNF with plan to complete 6 weeks of IV ceftriaxone.   OT comments  Upon entering the room, pt in side lying fetal position and crying with reports of being cold. Pt had pulled blankets and hospital gown off. Pt needing total A to don hospital gown. She is resistive to all movement and with just touching her she begins to cry out. OT unable to safely attempt transfer or EOB secondary to pt's resistive behavior. Pt was able to be repositioned after dressing with total A and pillow for comfort. Pt is making limited progress secondary to decreased cognition and pain. OT to re-assess at next session.    Recommendations for follow up therapy are one component of a multi-disciplinary discharge planning process, led by the attending physician.  Recommendations may be updated based on patient status, additional functional criteria and insurance authorization.    Follow Up Recommendations  Skilled nursing-short term rehab (<3 hours/day)    Assistance Recommended at Discharge Frequent or constant Supervision/Assistance  Patient can return home with the following  A lot of help with walking and/or transfers;A lot of help with bathing/dressing/bathroom;Assistance with cooking/housework;Assist for transportation;Help with stairs or ramp  for entrance;Assistance with feeding;Direct supervision/assist for medications management;Direct supervision/assist for financial management   Equipment Recommendations  Other (comment) (defer to next venue of care)       Precautions / Restrictions Precautions Precautions: Fall Restrictions Weight Bearing Restrictions: Yes RLE Weight Bearing: Weight bearing as tolerated       Mobility Bed Mobility Overal bed mobility: Needs Assistance Bed Mobility: Rolling Rolling: Total assist         General bed mobility comments: Pt is resistive to all repositioning    Transfers                   General transfer comment: unable this date         ADL either performed or assessed with clinical judgement   ADL Overall ADL's : Needs assistance/impaired                                       General ADL Comments: max assist to don hospital gown     Vision Patient Visual Report: No change from baseline            Cognition Arousal/Alertness: Awake/alert Behavior During Therapy: Flat affect, Anxious Overall Cognitive Status: Impaired/Different from baseline Area of Impairment: Orientation, Attention, Memory, Following commands, Safety/judgement, Awareness, Problem solving                 Orientation Level: Disoriented to, Time, Situation Current Attention Level: Sustained Memory: Decreased recall of precautions Following Commands: Follows one step commands inconsistently Safety/Judgement: Decreased awareness of safety, Decreased awareness of deficits Awareness: Intellectual Problem Solving: Slow processing, Decreased initiation, Difficulty sequencing, Requires verbal  cues, Requires tactile cues General Comments: max multimodal cues                   Pertinent Vitals/ Pain       Pain Assessment Pain Assessment: Faces Faces Pain Scale: Hurts worst Pain Location: R LE and generalized Pain Descriptors / Indicators: Crying, Grimacing,  Guarding Pain Intervention(s): Monitored during session, Repositioned         Frequency  Min 2X/week        Progress Toward Goals  OT Goals(current goals can now be found in the care plan section)  Progress towards OT goals: Not progressing toward goals - comment (limited progress secondary cognition and pain)  Acute Rehab OT Goals Patient Stated Goal: none stated OT Goal Formulation: With patient Time For Goal Achievement: 03/01/22 Potential to Achieve Goals: [place] Discharge plan remains appropriate;Frequency remains appropriate       AM-PAC OT "6 Clicks" Daily Activity     Outcome Measure   Help from another person eating meals?: A Little Help from another person taking care of personal grooming?: A Lot Help from another person toileting, which includes using toliet, bedpan, or urinal?: Total Help from another person bathing (including washing, rinsing, drying)?: A Lot Help from another person to put on and taking off regular upper body clothing?: A Lot Help from another person to put on and taking off regular lower body clothing?: A Lot 6 Click Score: 12    End of Session    OT Visit Diagnosis: Other abnormalities of gait and mobility (R26.89);Muscle weakness (generalized) (M62.81);History of falling (Z91.81);Pain Pain - Right/Left: Right Pain - part of body: Hip   Activity Tolerance Patient limited by pain   Patient Left in bed;with call bell/phone within reach;with bed alarm set   Nurse Communication Mobility status        Time: 6606-3016 OT Time Calculation (min): 16 min  Charges: OT General Charges $OT Visit: 1 Visit OT Treatments $Self Care/Home Management : 8-22 mins  Darleen Crocker, MS, OTR/L , CBIS ascom [phone]  02/19/22, 11:08 AM

## 2022-02-19 NOTE — Progress Notes (Signed)
  Subjective:  Patient is confused.  Objective:   VITALS:   Vitals:   02/18/22 0950 02/18/22 1613 02/19/22 0618 02/19/22 0700  BP: 116/84 (!) 131/97 (!) 129/58 (!) 160/85  Pulse: 92 98 95 90  Resp: '17 18 18 18  '$ Temp: 98.7 F (37.1 C) 99.7 F (37.6 C) 99.8 F (37.7 C) 98.9 F (37.2 C)  TempSrc:    Oral  SpO2: 99% 97% 100% 97%  Weight:      Height:        PHYSICAL EXAM:  Right lower extremity: Incisions are clean dry and intact.  Staples have been removed.  LABS  Results for orders placed or performed during the hospital encounter of 02/13/22 (from the past 24 hour(s))  CBC     Status: Abnormal   Collection Time: 02/19/22  6:24 AM  Result Value Ref Range   WBC 4.9 4.0 - 10.5 K/uL   RBC 3.19 (L) 3.87 - 5.11 MIL/uL   Hemoglobin 10.0 (L) 12.0 - 15.0 g/dL   HCT 31.4 (L) 36.0 - 46.0 %   MCV 98.4 80.0 - 100.0 fL   MCH 31.3 26.0 - 34.0 pg   MCHC 31.8 30.0 - 36.0 g/dL   RDW 14.0 11.5 - 15.5 %   Platelets 183 150 - 400 K/uL   nRBC 0.0 0.0 - 0.2 %  Basic metabolic panel     Status: Abnormal   Collection Time: 02/19/22  6:24 AM  Result Value Ref Range   Sodium 142 135 - 145 mmol/L   Potassium 3.7 3.5 - 5.1 mmol/L   Chloride 112 (H) 98 - 111 mmol/L   CO2 24 22 - 32 mmol/L   Glucose, Bld 113 (H) 70 - 99 mg/dL   BUN 11 8 - 23 mg/dL   Creatinine, Ser 0.89 0.44 - 1.00 mg/dL   Calcium 8.5 (L) 8.9 - 10.3 mg/dL   GFR, Estimated >60 >60 mL/min   Anion gap 6 5 - 15  Magnesium     Status: None   Collection Time: 02/19/22  6:24 AM  Result Value Ref Range   Magnesium 1.9 1.7 - 2.4 mg/dL    No results found.  Assessment/Plan:    Status post right hip IM nail, 01/25/2022 -Appreciate multidisciplinary support -PT: WBAT RLE -Patient can follow-up in outpatient clinic in 4 weeks with repeat x-rays     Renee Harder , MD 02/19/2022, 12:26 PM

## 2022-02-19 NOTE — Progress Notes (Signed)
Patient with 3 healed surgical incisions in her right hip with staples.  All staples removed per order.  17 staples removed total from all 3 incisions.  Patient tolerated removal well.  No bleeding noted at the site upon removal.  Will continue to monitor.

## 2022-02-19 NOTE — Progress Notes (Signed)
Physical Therapy Treatment Patient Details Name: Katie Bell MRN: [id number] DOB: May 04, 1940 Today's Date: 02/19/2022   History of Present Illness Katie Bell is a 81 y.o. female admitted from SNF secondary to [place] and found to have acute metabolic encephalopathy. Of note, pt with recent mechanical fall with R femur fx s/p surgical repair (WBAT R LE). PMH including but not limited to hypertension, anxiety, previous nephrolithiasis. Recently admitted from 10/05 - 10/20 for the previously mentioned fall. Hospital stay was complicated by Klebsiella pneumonia bacteremia, acute lower extremity DVT, acute embolic stroke and acute urinary retention. She was discharged to SNF with plan to complete 6 weeks of IV ceftriaxone.    PT Comments    Limited session due to cognitive impairments and c/o pain resisting ROM or mobility. Pt received on right side in fetal position. Attempted passive stretching of B LE's and repositioning however pt struggles to comprehend verbal cues therefore limiting tolerance. Pt positioned to center of bed with MaxA and supported with pillows to facilitate skin integrity. Continue to recommend SNF upon d/c   Recommendations for follow up therapy are one component of a multi-disciplinary discharge planning process, led by the attending physician.  Recommendations may be updated based on patient status, additional functional criteria and insurance authorization.  Follow Up Recommendations  Skilled nursing-short term rehab (<3 hours/day) Can patient physically be transported by private vehicle: No   Assistance Recommended at Discharge Frequent or constant Supervision/Assistance  Patient can return home with the following Two people to help with walking and/or transfers;A lot of help with bathing/dressing/bathroom;Assistance with cooking/housework;Assistance with feeding;Assist for transportation   Equipment Recommendations       Recommendations for Other Services Speech  consult     Precautions / Restrictions Precautions Precautions: Fall Restrictions Weight Bearing Restrictions: Yes RLE Weight Bearing: Weight bearing as tolerated     Mobility  Bed Mobility Overal bed mobility: Needs Assistance Bed Mobility: Rolling Rolling: Total assist              Transfers                   General transfer comment: unable this date    Ambulation/Gait               General Gait Details: currently unable   Stairs             Wheelchair Mobility    Modified Rankin (Stroke Patients Only)       Balance                                            Cognition Arousal/Alertness: Awake/alert Behavior During Therapy: Flat affect, Anxious Overall Cognitive Status: Impaired/Different from baseline Area of Impairment: Orientation, Attention, Memory, Following commands, Safety/judgement, Awareness, Problem solving                 Orientation Level: Disoriented to, Time, Situation Current Attention Level: Sustained Memory: Decreased recall of precautions Following Commands: Follows one step commands inconsistently Safety/Judgement: Decreased awareness of safety, Decreased awareness of deficits Awareness: Intellectual            Exercises General Exercises - Lower Extremity Ankle Circles/Pumps: PROM, Both, 5 reps, Supine, Limitations Ankle Circles/Pumps Limitations: unable to follow cues Heel Slides Limitations: does well with a combination of verbal and tactile cues, Rt side is slightly guarded but pt able to  complete Hip Abduction/Adduction Limitations: does well with a combination of verbal and tactile cues, Rt side is slightly guarded but pt able to complete    General Comments        Pertinent Vitals/Pain Pain Assessment Pain Assessment: Faces Faces Pain Scale: Hurts whole lot    Home Living                          Prior Function            PT Goals (current goals can  now be found in the care plan section) Acute Rehab PT Goals Patient Stated Goal: unable    Frequency    Min 2X/week      PT Plan Current plan remains appropriate    Co-evaluation              AM-PAC PT "6 Clicks" Mobility   Outcome Measure  Help needed turning from your back to your side while in a flat bed without using bedrails?: Total Help needed moving from lying on your back to sitting on the side of a flat bed without using bedrails?: Total Help needed moving to and from a bed to a chair (including a wheelchair)?: Total Help needed standing up from a chair using your arms (e.g., wheelchair or bedside chair)?: Total Help needed to walk in hospital room?: Total Help needed climbing 3-5 steps with a railing? : Total 6 Click Score: 6    End of Session   Activity Tolerance: Patient limited by pain Patient left: in bed;with call bell/phone within reach;with bed alarm set Nurse Communication: Mobility status PT Visit Diagnosis: Muscle weakness (generalized) (M62.81);History of falling (Z91.81)     Time: 1200-1209 PT Time Calculation (min) (ACUTE ONLY): 9 min  Charges:  $Therapeutic Activity: 8-22 mins                    Mikel Cella, PTA   [name] 02/19/2022, 1:41 PM

## 2022-02-19 NOTE — Progress Notes (Signed)
Speech Language Pathology Treatment: Dysphagia  Patient Details Name: Katie Bell MRN: [id number] DOB: 04/10/1941 Today's Date: 02/19/2022 Time: 4481-8563 SLP Time Calculation (min) (ACUTE ONLY): 40 min  Assessment / Plan / Recommendation Clinical Impression  Pt appears to present w/ mild+ oropharyngeal phase dysphagia in setting of declined Cognitive status currently; unsure of pt's Baseline Cognitive status as she had a similar presentation last admit/assessment. ANY Cognitive decline can impact overall awareness/timing of swallow and safety during po tasks which increases risk for aspiration/aspiration pneumonia -- pt's risk for developing pulmonary decline is increased d/t a more sedentary status s/p hip fx/repair last admit. She was discharged to Rehab but has readmitted now to the hospital. She appears deconditioned also. Pt's risk for aspiration can be reduced when following general aspiration precautions, feeding support at meals d/t Confusion, and using a modified diet consistency of a dysphagia diet w/ thickened liquids.  Pt required Mod verbal/visual/tactile cues for follow through during po tasks and w/ feeding.        Pt consumed several trials of Nectar liquids and purees post oral care(dried debris noted on teeth). No immediate, overt s/s of aspiration noted the trials; no overt coughing, decline in respiratory status, nor wet vocal quality b/t trials. Pt exhibited decreased oral prep awareness and discoordination of self-feeding but overall bolus management once it was in her mouth was adequate.  Oral phase was adequate for bolus management and oral clearing was functional alternating foods/liquids to aid clearing -- intermittently checked for full clearing.   Pt required feeding and cues for guidance d/t the Cognitive decline. Hand over hand guidance and visual cues were helpful during tasks.    In setting of Chronic illness/hospitalization, weakness and deconditioning, and  Cognitive decline presentation, risk for aspiration is increased currently. Pt required full support w/ feeding also.    Recommend the dysphagia level 1(PUREED foods) diet moistened for ease of oral phase w/ NECTAR liquids via Cup; general aspiration precautions; reduce Distractions during meals and engage pt during meals for self-feeding as able to reduce risk for aspiration(liquids esp.). Pills Crushed vs Whole in Puree for safer swallowing as needed. Support w/ feeding at meals and ensure oral clearing. Frequent oral care. MD/NSG updated.    ST services will follow pt during admit for ongoing assessment. Pt may benefit from Cognitive screening/assessment to determine strengths/weaknesses to better support POC re: her ADLs, safety awareness. Recommend f/u w/ Palliative Care for [place] and support moving forward. Precautions posted in room.  Pt appears to best tolerate purees and Nectar liquids in setting of Cognitive decline and deconditioned status.   HPI HPI: Pt is a 81 y.o. female with medical history significant of essential hypertension, Anxiety disorder, previous history of basal cell carcinoma, Hiatal Hernia per chart, GERD and kidney stones.  She had a recent mechanical fall complicated by right intertrochanteric fracture s/p surgical intervention 01/24/2022 and was discharged to SNF for Rehab on 02/09/22.  She presents to the ED with altered mental status.  Recent hospital stay was complicated by Klebsiella pneumonia bacteremia, acute lower extremity DVT, acute embolic stroke and acute urinary retention.  She was discharged to SNF with plan to complete 6 weeks of IV ceftriaxone.     Per EMS report, SNF staff concerned for altered mental status and possible underlying infection of surgical staples.   Lst admit, pt found to have acute/subacute R parietal infarct per MRI.  Head CT this admit: stable atrophy and white matter disease. This likely reflects the  sequela of chronic microvascular ischemia.  No  acute intracranial abnormality or significant interval change.      SLP Plan  Goals updated (ST services to f/u at SNF as indicated)      Recommendations for follow up therapy are one component of a multi-disciplinary discharge planning process, led by the attending physician.  Recommendations may be updated based on patient status, additional functional criteria and insurance authorization.    Recommendations  Diet recommendations: Dysphagia 1 (puree);Nectar-thick liquid Liquids provided via: Cup;Straw (pinch and monitor) Medication Administration: Crushed with puree (for safer swallowing) Supervision: Staff to assist with self feeding;Full supervision/cueing for compensatory strategies Compensations: Minimize environmental distractions;Slow rate;Small sips/bites;Lingual sweep for clearance of pocketing;Follow solids with liquid Postural Changes and/or Swallow Maneuvers: Out of bed for meals;Seated upright 90 degrees;Upright 30-60 min after meal                General recommendations:  (Dietician f/u; Palliative Care f/u for [place]) Oral Care Recommendations: Oral care BID;Oral care before and after PO;Staff/trained caregiver to provide oral care Follow Up Recommendations: Skilled nursing-short term rehab (<3 hours/day) (at next venue of care) Assistance recommended at discharge: Frequent or constant Supervision/Assistance (d/t Cognitive decline and need for feeding) SLP Visit Diagnosis: Dysphagia, oropharyngeal phase (R13.12);Cognitive communication deficit (R41.841) (appears similar to previous admit - new baseline) Plan: Goals updated (ST services to f/u at SNF as indicated)             Orinda Kenner, [place], [place]; [place] [phone] (ascom) Alese Furniss  02/19/2022, 4:57 PM

## 2022-02-19 NOTE — Progress Notes (Signed)
Son [name] made aware that pts yellow band ring and white band ring placed in a clear specimen cup and placed on pts counter top, states that he will pick them up if he comes

## 2022-02-20 DIAGNOSIS — G9341 Metabolic encephalopathy: Secondary | ICD-10-CM | POA: Diagnosis not present

## 2022-02-20 DIAGNOSIS — S3993XA Unspecified injury of pelvis, initial encounter: Secondary | ICD-10-CM | POA: Diagnosis not present

## 2022-02-20 DIAGNOSIS — E870 Hyperosmolality and hypernatremia: Secondary | ICD-10-CM | POA: Diagnosis not present

## 2022-02-20 DIAGNOSIS — I6330 Cerebral infarction due to thrombosis of unspecified cerebral artery: Secondary | ICD-10-CM | POA: Diagnosis not present

## 2022-02-20 DIAGNOSIS — D649 Anemia, unspecified: Secondary | ICD-10-CM | POA: Diagnosis not present

## 2022-02-20 DIAGNOSIS — R41841 Cognitive communication deficit: Secondary | ICD-10-CM | POA: Diagnosis not present

## 2022-02-20 DIAGNOSIS — R7881 Bacteremia: Secondary | ICD-10-CM | POA: Diagnosis not present

## 2022-02-20 DIAGNOSIS — R2689 Other abnormalities of gait and mobility: Secondary | ICD-10-CM | POA: Diagnosis not present

## 2022-02-20 DIAGNOSIS — R338 Other retention of urine: Secondary | ICD-10-CM | POA: Diagnosis not present

## 2022-02-20 DIAGNOSIS — R58 Hemorrhage, not elsewhere classified: Secondary | ICD-10-CM | POA: Diagnosis not present

## 2022-02-20 DIAGNOSIS — S299XXA Unspecified injury of thorax, initial encounter: Secondary | ICD-10-CM | POA: Diagnosis not present

## 2022-02-20 DIAGNOSIS — R4182 Altered mental status, unspecified: Secondary | ICD-10-CM | POA: Diagnosis not present

## 2022-02-20 DIAGNOSIS — Z7401 Bed confinement status: Secondary | ICD-10-CM | POA: Diagnosis not present

## 2022-02-20 DIAGNOSIS — Z8673 Personal history of transient ischemic attack (TIA), and cerebral infarction without residual deficits: Secondary | ICD-10-CM | POA: Diagnosis not present

## 2022-02-20 DIAGNOSIS — R041 Hemorrhage from throat: Secondary | ICD-10-CM | POA: Diagnosis not present

## 2022-02-20 DIAGNOSIS — R296 Repeated falls: Secondary | ICD-10-CM | POA: Diagnosis not present

## 2022-02-20 DIAGNOSIS — S41102A Unspecified open wound of left upper arm, initial encounter: Secondary | ICD-10-CM | POA: Diagnosis not present

## 2022-02-20 DIAGNOSIS — R609 Edema, unspecified: Secondary | ICD-10-CM | POA: Diagnosis not present

## 2022-02-20 DIAGNOSIS — G9389 Other specified disorders of brain: Secondary | ICD-10-CM | POA: Diagnosis not present

## 2022-02-20 DIAGNOSIS — R1312 Dysphagia, oropharyngeal phase: Secondary | ICD-10-CM | POA: Diagnosis not present

## 2022-02-20 DIAGNOSIS — S72141A Displaced intertrochanteric fracture of right femur, initial encounter for closed fracture: Secondary | ICD-10-CM | POA: Diagnosis not present

## 2022-02-20 DIAGNOSIS — S0990XA Unspecified injury of head, initial encounter: Secondary | ICD-10-CM | POA: Diagnosis not present

## 2022-02-20 DIAGNOSIS — W19XXXA Unspecified fall, initial encounter: Secondary | ICD-10-CM | POA: Diagnosis not present

## 2022-02-20 DIAGNOSIS — J150 Pneumonia due to Klebsiella pneumoniae: Secondary | ICD-10-CM | POA: Diagnosis not present

## 2022-02-20 DIAGNOSIS — Z87891 Personal history of nicotine dependence: Secondary | ICD-10-CM | POA: Diagnosis not present

## 2022-02-20 DIAGNOSIS — B961 Klebsiella pneumoniae [K. pneumoniae] as the cause of diseases classified elsewhere: Secondary | ICD-10-CM | POA: Diagnosis not present

## 2022-02-20 DIAGNOSIS — R069 Unspecified abnormalities of breathing: Secondary | ICD-10-CM | POA: Diagnosis not present

## 2022-02-20 DIAGNOSIS — Y92129 Unspecified place in nursing home as the place of occurrence of the external cause: Secondary | ICD-10-CM | POA: Diagnosis not present

## 2022-02-20 DIAGNOSIS — S72001D Fracture of unspecified part of neck of right femur, subsequent encounter for closed fracture with routine healing: Secondary | ICD-10-CM | POA: Diagnosis not present

## 2022-02-20 DIAGNOSIS — B372 Candidiasis of skin and nail: Secondary | ICD-10-CM | POA: Diagnosis not present

## 2022-02-20 DIAGNOSIS — G319 Degenerative disease of nervous system, unspecified: Secondary | ICD-10-CM | POA: Diagnosis not present

## 2022-02-20 DIAGNOSIS — R278 Other lack of coordination: Secondary | ICD-10-CM | POA: Diagnosis not present

## 2022-02-20 DIAGNOSIS — R410 Disorientation, unspecified: Secondary | ICD-10-CM | POA: Diagnosis not present

## 2022-02-20 DIAGNOSIS — I6340 Cerebral infarction due to embolism of unspecified cerebral artery: Secondary | ICD-10-CM | POA: Diagnosis not present

## 2022-02-20 DIAGNOSIS — S72001A Fracture of unspecified part of neck of right femur, initial encounter for closed fracture: Secondary | ICD-10-CM | POA: Diagnosis not present

## 2022-02-20 DIAGNOSIS — F0390 Unspecified dementia without behavioral disturbance: Secondary | ICD-10-CM | POA: Diagnosis not present

## 2022-02-20 DIAGNOSIS — Z85828 Personal history of other malignant neoplasm of skin: Secondary | ICD-10-CM | POA: Diagnosis not present

## 2022-02-20 DIAGNOSIS — I959 Hypotension, unspecified: Secondary | ICD-10-CM | POA: Diagnosis not present

## 2022-02-20 DIAGNOSIS — I10 Essential (primary) hypertension: Secondary | ICD-10-CM | POA: Diagnosis not present

## 2022-02-20 DIAGNOSIS — Z86711 Personal history of pulmonary embolism: Secondary | ICD-10-CM | POA: Diagnosis not present

## 2022-02-20 DIAGNOSIS — R404 Transient alteration of awareness: Secondary | ICD-10-CM | POA: Diagnosis not present

## 2022-02-20 DIAGNOSIS — I82409 Acute embolism and thrombosis of unspecified deep veins of unspecified lower extremity: Secondary | ICD-10-CM | POA: Diagnosis not present

## 2022-02-20 DIAGNOSIS — L89153 Pressure ulcer of sacral region, stage 3: Secondary | ICD-10-CM | POA: Diagnosis not present

## 2022-02-20 DIAGNOSIS — L7622 Postprocedural hemorrhage and hematoma of skin and subcutaneous tissue following other procedure: Secondary | ICD-10-CM | POA: Diagnosis not present

## 2022-02-20 DIAGNOSIS — R339 Retention of urine, unspecified: Secondary | ICD-10-CM | POA: Diagnosis not present

## 2022-02-20 DIAGNOSIS — Z452 Encounter for adjustment and management of vascular access device: Secondary | ICD-10-CM | POA: Diagnosis not present

## 2022-02-20 DIAGNOSIS — F413 Other mixed anxiety disorders: Secondary | ICD-10-CM | POA: Diagnosis not present

## 2022-02-20 DIAGNOSIS — I700 Atherosclerosis of aorta: Secondary | ICD-10-CM | POA: Diagnosis not present

## 2022-02-20 DIAGNOSIS — M6281 Muscle weakness (generalized): Secondary | ICD-10-CM | POA: Diagnosis not present

## 2022-02-20 DIAGNOSIS — R8271 Bacteriuria: Secondary | ICD-10-CM | POA: Diagnosis not present

## 2022-02-20 LAB — BASIC METABOLIC PANEL
Anion gap: 4 — ABNORMAL LOW (ref 5–15)
BUN: 14 mg/dL (ref 8–23)
CO2: 25 mmol/L (ref 22–32)
Calcium: 8.3 mg/dL — ABNORMAL LOW (ref 8.9–10.3)
Chloride: 116 mmol/L — ABNORMAL HIGH (ref 98–111)
Creatinine, Ser: 0.79 mg/dL (ref 0.44–1.00)
GFR, Estimated: >60 mL/min (ref >60)
Glucose, Bld: 91 mg/dL (ref 70–99)
Potassium: 3.2 mmol/L — ABNORMAL LOW (ref 3.5–5.1)
Sodium: 145 mmol/L (ref 135–145)

## 2022-02-20 LAB — CBC
HCT: 30.2 % — ABNORMAL LOW (ref 36.0–46.0)
Hemoglobin: 9.4 g/dL — ABNORMAL LOW (ref 12.0–15.0)
MCH: 31.5 pg (ref 26.0–34.0)
MCHC: 31.1 g/dL (ref 30.0–36.0)
MCV: 101.3 fL — ABNORMAL HIGH (ref 80.0–100.0)
Platelets: 165 10*3/uL (ref 150–400)
RBC: 2.98 MIL/uL — ABNORMAL LOW (ref 3.87–5.11)
RDW: 13.9 % (ref 11.5–15.5)
WBC: 4.9 10*3/uL (ref 4.0–10.5)
nRBC: 0 % (ref 0.0–0.2)

## 2022-02-20 LAB — CULTURE, BLOOD (ROUTINE X 2)
Culture: NO GROWTH
Special Requests: ADEQUATE

## 2022-02-20 LAB — MAGNESIUM: Magnesium: 2 mg/dL (ref 1.7–2.4)

## 2022-02-20 MED ORDER — FOLIC ACID 1 MG PO TABS: 1.0000 mg | ORAL_TABLET | Freq: Every day | ORAL | Status: AC

## 2022-02-20 MED ORDER — POTASSIUM CHLORIDE 20 MEQ PO PACK
40.0000 meq | PACK | Freq: Once | ORAL | Status: AC
  Administered 2022-02-20: 40 meq via ORAL
  Filled 2022-02-20: qty 2

## 2022-02-20 MED ORDER — NEPRO/CARBSTEADY PO LIQD: 237.0000 mL | Freq: Three times a day (TID) | ORAL | 0 refills | Status: AC

## 2022-02-20 MED ORDER — VITAMIN B-1 100 MG PO TABS: 100.0000 mg | ORAL_TABLET | Freq: Every day | ORAL | Status: AC

## 2022-02-20 MED ORDER — ZINC SULFATE 220 (50 ZN) MG PO CAPS: 220.0000 mg | ORAL_CAPSULE | Freq: Every day | ORAL | Status: AC

## 2022-02-20 MED ORDER — ASCORBIC ACID 500 MG PO TABS: 500.0000 mg | ORAL_TABLET | Freq: Two times a day (BID) | ORAL | Status: AC

## 2022-02-20 MED ORDER — ADULT MULTIVITAMIN W/MINERALS CH: 1.0000 | ORAL_TABLET | Freq: Every day | ORAL | Status: AC

## 2022-02-20 NOTE — Progress Notes (Signed)
EVS found rings of patients, son called. Reports he will come tomorrow to pick up from nurses station.

## 2022-02-20 NOTE — Progress Notes (Deleted)
   02/20/22 1400  Medical Necessity for Transport Certificate --- IF THIS TRANSPORT IS ROUND TRIP OR SCHEDULED AND REPEATED, A PHYSICIAN MUST COMPLETE THIS FORM  Transport from: Teacher, English as a foreign language) Doctor, hospital to (Location) Other (Comment) ([place])  Did the patient arrive from a [place], [place] or Group Home? Yes  Care Facility Name [facility]  Is this the closest appropriate facility? Yes  Date of Transport Service 02/20/22  Name of [facility]  Round Trip Transport? No  Reason for Transport Discharge  Is this a hospice patient? No  Describe the Medical Condition hip fracture  Q1 Are ALL the following "true"? 1. Patient unable to get up from bed without assistance  AND  2. Unable to ambulate  AND  3. Unable to sit in a chair, including wheelchair. Yes  Q2 Could the patient be transported safely by other means of transportation (I.E., wheelchair van)? No  Q3 Please check any of the following conditions that apply at the time of transport: Altered mental status;Risk of injury to self and/or others;Requires immobilization of fracture or possible fracture;Requires IV maintenance;Other (Comment) (foley)  Electronic Signature Colen Darling, LMSW  Credentials DP  Date Signed 02/20/22

## 2022-02-20 NOTE — TOC Progression Note (Signed)
Transition of Care [facility]) - Progression Note    Patient Details  Name: Katie Bell MRN: [id number] Date of Birth: [birth year]  Transition of Care [facility]) CM/SW [place], [place] Phone Number: 02/20/2022, 1:05 PM  Clinical Narrative:     Patient has been accepted to [facility]. Authorization obtained and sent to SNF.  Expected Discharge Plan: [place] Barriers to Discharge: Continued Medical Work up  Expected Discharge Plan and Services Expected Discharge Plan: [place] Choice: [place] arrangements for the past 2 months: Single Family Home                                       Social Determinants of Health (SDOH) Interventions    Readmission Risk Interventions     No data to display

## 2022-02-20 NOTE — Progress Notes (Signed)
Patient discharged, Discharge instructions provided to staff at [facility]. Patient discharged with foley catheter and with single lumen PICC line for continued treatment.

## 2022-02-20 NOTE — TOC Transition Note (Signed)
Transition of Care [facility]) - CM/SW Discharge Note   Patient Details  Name: Katie Bell MRN: [id number] Date of Birth: May 18, 1940  [facility] CM/SW Contact:  Colen Darling, [place] Phone Number: 02/20/2022, 3:01 PM   Clinical Narrative:     SW spoke with the son Katie Bell to inform him that the patient is discharging to SNF today. RN given number for report. SNF is ordering IV ceftriaxone to start treatment tomorrow.  The patient resumed her IV ceftriaxone that goes until 11/20.     Barriers to Discharge: Barriers Resolved   Patient Goals and CMS Choice Patient states their goals for this hospitalization and ongoing recovery are:: SNF for rehab short term CMS Medicare.gov Compare Post Acute Care list provided to:: Patient Represenative (must comment) Choice offered to / list presented to : Adult Children  Discharge Placement PASRR number recieved: 02/20/22 Existing PASRR number confirmed : 02/20/22          Patient chooses bed at: [facility] Patient to be transferred to facility by: [facility] Name of family member notified: [name] Patient and family notified of of transfer: 02/20/22  Discharge Plan and Services     Post Acute Care Choice: [place]                               Social Determinants of Health (SDOH) Interventions     Readmission Risk Interventions     No data to display

## 2022-02-20 NOTE — Discharge Summary (Signed)
Physician Discharge Summary   Katie Bell  female DOB: 1940-12-23  VHQ:[id number]  PCP: Katie Carbon, MD  Admit date: 02/13/2022 Discharge date: 02/20/2022  Admitted From: SNF rehab Disposition:  different SNF rehab CODE STATUS: Full code  Discharge Instructions     No wound care   Complete by: As directed       Hospital Course:  For full details, please see H&P, progress notes, consult notes and ancillary notes.  Briefly,  Katie Bell is a 81 y.o. female with medical history significant of hypertension, anxiety, previous nephrolithiasis and recent mechanical fall complicated by right intertrochanteric fracture s/p surgical intervention who presented to the ED with altered mental status.     Recently admitted from 10/05 - 10/20 after a mechanical fall that was complicated by right intertrochanteric fracture s/p surgical repair.  Hospital stay was complicated by Klebsiella pneumonia bacteremia, acute lower extremity DVT, acute embolic stroke and acute urinary retention.  She was discharged to SNF with plan to complete 6 weeks of IV ceftriaxone.   * Acute metabolic encephalopathy Patient presenting with acute encephalopathy of 1 day duration.  Suspect patient's encephalopathy secondary to urinary retention and hypernatremia and dehydration.   --seems to have baseline dementia or cognitive decline --mental status improving gradually   Acute urinary retention Recent hospitalization during which patient experienced urinary retention with Foley removal on day of discharge.  Presenting with approximately 1.2 L of output on catheter insertion.   --cont Foley  --Given that this is the 2nd occurrence of urinary retention, pt will need outpatient urology followup.   Hypernatremia Hypernatremia up to 150 on admission.  Per patient's son Katie Bell, patient has not been drinking fluids consistently at [facility].  Pt appeared dry on exam. - IV fluid resuscitation with LR --Na did not  improve on 1/2 NS infusion, so switched to D5.  Na normalized on 10/29, D5 infusion d/c'ed. --encourage oral hydration   AKI (acute kidney injury) ([place]) --Cr 1.43 on presentation.  Baseline around 0.8.  Secondary to urinary retention and dehydration. --s/p IVF, Cr back to baseline   Leucocytosis Resolved the next day.   Bacteremia due to Klebsiella pneumoniae Recent klebsiella bacteremia for which she is getting  IV ceftriaxone until 03/12/22 as we are treating like possible endocarditis because of embolic stroke and not able to do TEE.   Acute deep vein thrombosis (DVT) of tibial vein of both lower extremities (HCC) Provoked DVT in the setting of recent fracture and prolonged hospitalization. --cont Eliquis   Recent cerebrovascular accident (CVA) --embolic strokes- cardiac source of thrombus/vegetaion questioned. --cont Eliquis   Bacteriuria VRE in urine is fecal contamination/colonization --Per ID, hold antibiotic as no evidence of systemic infection   Protein-calorie malnutrition, severe --supplements per dietician  Recent hx of right intertrochanteric fracture s/p INTRAMEDULLARY (IM) NAIL on 01/25/22 --ortho Dr. Sharlet Bell saw pt on 10/30 for followup.  Staples removed.  --WBAT RLE  --follow-up in outpatient clinic in 4 weeks with repeat x-rays   Discharge Diagnoses:  Principal Problem:   Acute metabolic encephalopathy Active Problems:   Acute urinary retention   Hypernatremia   AKI (acute kidney injury) ([place])   Sinus tachycardia   Leucocytosis   Bacteremia due to Klebsiella pneumoniae   Acute deep vein thrombosis (DVT) of tibial vein of both lower extremities (HCC)   Protein-calorie malnutrition, severe   AMS (altered mental status)   Bacteriuria   Recent cerebrovascular accident (CVA)   30 Day Unplanned Readmission Risk Score  Flowsheet Row ED to Hosp-Admission (Current) from 02/13/2022 in [place]  30 Day  Unplanned Readmission Risk Score (%) 17.63 Filed at 02/20/2022 1200       This score is the patient's risk of an unplanned readmission within 30 days of being discharged (0 -100%). The score is based on dignosis, age, lab data, medications, orders, and past utilization.   Low:  0-14.9   Medium: 15-21.9   High: 22-29.9   Extreme: 30 and above         Discharge Instructions:  Allergies as of 02/20/2022       Reactions   Sulfamethoxazole-trimethoprim Swelling   Angioedema and pruritis per 01/01/2017 documentation in care everywhere   Estradiol Other (See Comments)   Vaginal irritation Other reaction(s): Other (See Comments) Vaginal irritation   Morphine    agitation   Sertraline Hcl Other (See Comments)   Migraine headaches, severe   Morphine And Related Anxiety        Medication List     TAKE these medications    acetaminophen 325 MG tablet Commonly known as: TYLENOL Take 650 mg by mouth every 6 (six) hours as needed for mild pain.   amLODipine 5 MG tablet Commonly known as: NORVASC TAKE 1 TABLET BY MOUTH ONCE DAILY   apixaban 5 MG Tabs tablet Commonly known as: ELIQUIS Take 1 tablet (5 mg total) by mouth 2 (two) times daily.   ascorbic acid 500 MG tablet Commonly known as: VITAMIN C Take 1 tablet (500 mg total) by mouth 2 (two) times daily.   cefTRIAXone  IVPB Commonly known as: ROCEPHIN Inject 2 g into the vein daily. Indication:  Klebsiella bacteremia with possible endocarditis Last Day of Therapy:  03/12/2022 Labs weekly on Monday while on IV antibiotics: CBC with differential, CMP Fax weekly lab results promptly to [phone] Please pull PICC at completion of IV antibiotics Method of administration: IV Push Method of administration may be changed at the discretion of home infusion pharmacist based upon assessment of the patient and/or caregiver's ability to self-administer the medication ordered.   feeding supplement (NEPRO CARB STEADY) Liqd Take  237 mLs by mouth 3 (three) times daily between meals.   folic acid 1 MG tablet Commonly known as: FOLVITE Take 1 tablet (1 mg total) by mouth daily. Start taking on: February 21, 2022   multivitamin with minerals Tabs tablet Take 1 tablet by mouth daily. Start taking on: February 21, 2022   potassium chloride SA 20 MEQ tablet Commonly known as: KLOR-CON M TAKE 1 TABLET BY MOUTH ONCE DAILY   thiamine 100 MG tablet Commonly known as: Vitamin B-1 Take 1 tablet (100 mg total) by mouth daily. Start taking on: February 21, 2022   Vitamin D 400 units capsule Take 800 Units by mouth daily.   zinc sulfate 220 (50 Zn) MG capsule Take 1 capsule (220 mg total) by mouth daily. Start taking on: February 21, 2022         Contact information for follow-up providers     Katie Carbon, MD Follow up.   Specialties: Internal Medicine, Pediatrics Why: Facility will make follow up appt Contact information: [place] [address] [phone]         Renee Harder, MD Follow up in 4 week(s).   Specialty: Orthopedic Surgery Why: Facility will make follow up appt  outpatient clinic in 4 weeks with repeat x-rays Contact information: [place]  [phone]              Contact information for after-discharge care     Destination     [facility] .   Service: Skilled Nursing Contact information: [place] [address] [phone]                     Allergies  Allergen Reactions   Sulfamethoxazole-Trimethoprim Swelling    Angioedema and pruritis per 01/01/2017 documentation in care everywhere   Estradiol Other (See Comments)    Vaginal irritation Other reaction(s): Other (See Comments) Vaginal irritation   Morphine     agitation   Sertraline Hcl Other (See Comments)    Migraine headaches, severe   Morphine And Related Anxiety     The  results of significant diagnostics from this hospitalization (including imaging, microbiology, ancillary and laboratory) are listed below for reference.   Consultations:   Procedures/Studies: CT Renal Stone Study  Addendum Date: 02/13/2022   ADDENDUM REPORT: 02/13/2022 18:56 ADDENDUM: Additionally, there is a new right paramedian sacral decubitus ulcer without CT evidence of underlying osteomyelitis. No fluid collection. Electronically Signed   By: Titus Dubin M.D.   On: 02/13/2022 18:56   Result Date: 02/13/2022 CLINICAL DATA:  Altered mental status. EXAM: CT ABDOMEN AND PELVIS WITHOUT CONTRAST TECHNIQUE: Multidetector CT imaging of the abdomen and pelvis was performed following the standard protocol without IV contrast. RADIATION DOSE REDUCTION: This exam was performed according to the departmental dose-optimization program which includes automated exposure control, adjustment of the mA and/or kV according to patient size and/or use of iterative reconstruction technique. COMPARISON:  CT abdomen pelvis dated July 12, 2020. FINDINGS: Lower chest: No acute abnormality. Hepatobiliary: No focal liver abnormality is seen. Status post cholecystectomy. No biliary dilatation. Pancreas: Unremarkable. No pancreatic ductal dilatation or surrounding inflammatory changes. Spleen: Normal in size without focal abnormality. Adrenals/Urinary Tract: The adrenal glands are unremarkable. Unchanged large left renal simple cyst. No follow-up imaging is recommended. No renal calculi or hydronephrosis. The bladder is decompressed by Foley catheter. Stomach/Bowel: Stomach is within normal limits. Appendix appears normal. No evidence of bowel wall thickening, distention, or inflammatory changes. Vascular/Lymphatic: Aortic atherosclerosis. No enlarged abdominal or pelvic lymph nodes. Reproductive: Uterus and bilateral adnexa are unremarkable. Other: No free fluid or pneumoperitoneum. Musculoskeletal: No acute or significant  osseous findings. Partially healed right intertrochanteric femur fracture status post ORIF. 2.2 cm subcutaneous hematoma in the lateral proximal thigh at the level of the subtrochanteric femur. IMPRESSION: 1. No acute intra-abdominal process. 2. Partially healed right intertrochanteric femur fracture status post ORIF. 3.  Aortic Atherosclerosis (ICD10-I70.0). Electronically Signed: By: Titus Dubin M.D. On: 02/13/2022 18:45   CT Head Wo Contrast  Result Date: 02/13/2022 CLINICAL DATA:  Change in baseline altered mental status. Recent hip surgery. EXAM: CT HEAD WITHOUT CONTRAST TECHNIQUE: Contiguous axial images were obtained from the base of the skull through the vertex without intravenous contrast. RADIATION DOSE REDUCTION: This exam was performed according to the departmental dose-optimization program which includes automated exposure control, adjustment of the mA and/or kV according to patient size and/or use of iterative reconstruction technique. COMPARISON:  CT head without contrast 02/06/2022 and 02/04/2022. MR head without and with contrast 01/30/2022. FINDINGS: Brain: Expected evolution of a right parietal infarct is present. No residual blood products are present. Moderate atrophy and white matter disease is otherwise stable. No acute infarct or hemorrhage is present. No focal mass lesion is present. Basal ganglia  are within normal limits. No acute cortical abnormality is present. The brainstem and cerebellum are within normal limits. Vascular: Atherosclerotic calcifications are present within the cavernous internal carotid arteries bilaterally. No hyperdense vessel is present. Skull: Calvarium is intact. No focal lytic or blastic lesions are present. No significant extracranial soft tissue lesion is present. Sinuses/Orbits: The paranasal sinuses and mastoid air cells are clear. Bilateral lens replacements are noted. Globes and orbits are otherwise unremarkable. IMPRESSION: 1. Expected evolution of  a right parietal infarct. No residual blood products are present. 2. Stable atrophy and white matter disease. This likely reflects the sequela of chronic microvascular ischemia. 3. No acute intracranial abnormality or significant interval change. Electronically Signed   By: San Morelle M.D.   On: 02/13/2022 17:56   DG Pelvis Portable  Result Date: 02/13/2022 CLINICAL DATA:  R hip pain (post-surgery), eval for any obvious pathlogy EXAM: PORTABLE PELVIS 1-2 VIEWS COMPARISON:  None Available. FINDINGS: Status post intramedullary nail fixation of the right proximal femoral fracture that is partially visualized. There is no evidence of pelvic fracture or diastasis. Frontal view of the left hip unremarkable. No pelvic bone lesions are seen. Degenerative changes visualized lower lumbar spine. Associated severe soft tissue edema and emphysema consistent with postsurgical changes. Skin staples overlie the right hip. IMPRESSION: Status post intramedullary nail fixation of the right proximal femoral fracture. Electronically Signed   By: Iven Finn M.D.   On: 02/13/2022 16:54   DG Chest Port 1 View  Result Date: 02/13/2022 CLINICAL DATA:  Questionable sepsis - evaluate for abnormality EXAM: PORTABLE CHEST 1 VIEW COMPARISON:  Chest x-ray 02/09/2022 FINDINGS: Right PICC with tip overlying the superior cavoatrial junction. The heart and mediastinal contours are unchanged. Aortic calcification No focal consolidation. Chronic coarsened interstitial markings with no pulmonary edema. No pleural effusion. No pneumothorax. No acute osseous abnormality. Bilateral acromioclavicular joint degenerative changes. IMPRESSION: No active disease. Electronically Signed   By: Iven Finn M.D.   On: 02/13/2022 16:53   DG Chest 1 View  Result Date: 02/09/2022 CLINICAL DATA:  PICC line placement EXAM: CHEST  1 VIEW COMPARISON:  01/30/2022 FINDINGS: Status post right arm PICC line placement with tip approximating the  low SVC. Normal cardiac and mediastinal contours. No focal pulmonary opacity. No pleural effusion or pneumothorax. No acute osseous abnormality. IMPRESSION: Status post right arm PICC line placement with tip approximating the low SVC. No acute cardiopulmonary process. Electronically Signed   By: Merilyn Baba M.D.   On: 02/09/2022 13:40   [place] EKG SITE RITE  Result Date: 02/08/2022 If Site Rite image not attached, placement could not be confirmed due to current cardiac rhythm.  CT HEAD WO CONTRAST (5MM)  Result Date: 02/06/2022 CLINICAL DATA:  Stroke follow-up EXAM: CT HEAD WITHOUT CONTRAST TECHNIQUE: Contiguous axial images were obtained from the base of the skull through the vertex without intravenous contrast. RADIATION DOSE REDUCTION: This exam was performed according to the departmental dose-optimization program which includes automated exposure control, adjustment of the mA and/or kV according to patient size and/or use of iterative reconstruction technique. COMPARISON:  02/04/22 FINDINGS: Brain: Evolving right parietal infarct with associated petechial hemorrhage. No evidence of a parenchymal hematoma. Unchanged hypodensity in the external capsule on the right. Compared to prior exam there is slight interval increase in conspicuity of the hypodensity in the posterior limb of the internal capsule on left (series 100, image 24). No hydrocephalus. No extra-axial fluid collection. Vascular: No hyperdense vessel or unexpected calcification. Skull: Normal. Negative for  fracture or focal lesion. Sinuses/Orbits: Trace left mastoid effusion and mild mucosal thickening in the right sphenoid sinus. Bilateral lens replacement. Other: None. IMPRESSION: 1. Evolving right parietal infarct with associated petechial hemorrhage. No evidence of a parenchymal hematoma. 2. Possible new hypodensity in the posterior limb of the internal capsule on the left. This is nonspecific and may be artifactual, but if there is  clinical concern for new infarct, further evaluation with MRI could be considered. Electronically Signed   By: Marin Roberts M.D.   On: 02/06/2022 09:31   US Venous Img Lower Unilateral Left (DVT)  Result Date: 02/06/2022 CLINICAL DATA:  Prior study demonstrating thrombus in the right peroneal vein and left posterior tibial vein. EXAM: LEFT LOWER EXTREMITY VENOUS DOPPLER ULTRASOUND TECHNIQUE: Gray-scale sonography with graded compression, as well as color Doppler and duplex ultrasound were performed to evaluate the lower extremity deep venous systems from the level of the common femoral vein and including the common femoral, femoral, profunda femoral, popliteal and calf veins including the posterior tibial, peroneal and gastrocnemius veins when visible. The superficial great saphenous vein was also interrogated. Spectral Doppler was utilized to evaluate flow at rest and with distal augmentation maneuvers in the common femoral, femoral and popliteal veins. COMPARISON:  None Available. FINDINGS: Contralateral Common Femoral Vein: Respiratory phasicity is normal and symmetric with the symptomatic side. No evidence of thrombus. Normal compressibility. Common Femoral Vein: Nonocclusive thrombus is identified in the left common femoral vein extending up to the saphenofemoral junction but not occluding the SFJ. Saphenofemoral Junction: No evidence of thrombus. Normal compressibility and flow on color Doppler imaging. Profunda Femoral Vein: No evidence of thrombus. Normal compressibility and flow on color Doppler imaging. Femoral Vein: No evidence of thrombus. Normal compressibility, respiratory phasicity and response to augmentation. Popliteal Vein: No evidence of thrombus. Normal compressibility, respiratory phasicity and response to augmentation. Calf Veins: Peroneal vein remains normally patent. The main segment of the posterior tibial vein is now compressible but there appears to be some thrombus in a small branch  vein communicating with the posterior tibial vein. Normal compressibility and flow on color Doppler imaging. Superficial Great Saphenous Vein: No evidence of thrombus. Normal compressibility. Venous Reflux:  None. Other Findings: No evidence of superficial thrombophlebitis or abnormal fluid collection. After examination of the left lower extremity, attempt was made to study the right lower extremity but the patient refused continuation of the examination due to pain in the right lower extremity from recent fracture. Only a \ left lower extremity study was able to be accomplished. IMPRESSION: 1. Nonocclusive thrombus in the left common femoral vein extending up to the saphenofemoral junction. 2. Thrombus in a small branch of the left posterior tibial vein. 3. After examination of the right lower extremity, attempt was made to study the right lower extremity but the patient refused continuation of the examination due to pain in the right lower extremity. Only a left lower extremity study was able to be accomplished. Electronically Signed   By: Aletta Edouard M.D.   On: 02/06/2022 08:06   US Carotid Bilateral  Result Date: 02/05/2022 CLINICAL DATA:  Cerebral infarction and hypertension. EXAM: BILATERAL CAROTID DUPLEX ULTRASOUND TECHNIQUE: Pearline Cables scale imaging, color Doppler and duplex ultrasound were performed of bilateral carotid and vertebral arteries in the neck. COMPARISON:  None Available. FINDINGS: Criteria: Quantification of carotid stenosis is based on velocity parameters that correlate the residual internal carotid diameter with NASCET-based stenosis levels, using the diameter of the distal internal carotid lumen as the  denominator for stenosis measurement. The following velocity measurements were obtained: RIGHT ICA:  78/23 cm/sec CCA:  21/30 cm/sec SYSTOLIC ICA/CCA RATIO:  1.0 ECA:  73 cm/sec LEFT ICA:  87/28 cm/sec CCA:  86/57 cm/sec SYSTOLIC ICA/CCA RATIO:  1.1 ECA:  61 cm/sec RIGHT CAROTID ARTERY:  Mild calcified plaque at the level of the carotid bulb and proximal right ICA. Estimated right ICA stenosis is less than 50%. RIGHT VERTEBRAL ARTERY: Antegrade flow with normal waveform and velocity. LEFT CAROTID ARTERY: Mild calcified plaque at the level of the left carotid bulb and proximal left ICA. Estimated left ICA stenosis is less than 50%. LEFT VERTEBRAL ARTERY: Antegrade flow with normal waveform and velocity. IMPRESSION: Mild calcified plaque at the level of both carotid bulbs and proximal internal carotid arteries. No significant carotid stenosis identified with estimated bilateral ICA stenoses of less than 50%. Electronically Signed   By: Aletta Edouard M.D.   On: 02/05/2022 11:32   CT HEAD WO CONTRAST (5MM)  Result Date: 02/04/2022 CLINICAL DATA:  Stroke follow-up.  Rule out hemorrhagic conversion. EXAM: CT HEAD WITHOUT CONTRAST TECHNIQUE: Contiguous axial images were obtained from the base of the skull through the vertex without intravenous contrast. RADIATION DOSE REDUCTION: This exam was performed according to the departmental dose-optimization program which includes automated exposure control, adjustment of the mA and/or kV according to patient size and/or use of iterative reconstruction technique. COMPARISON:  CT head 01/30/2022, MR head 01/30/2022 FINDINGS: Brain: Again seen is the evolving infarct in the right parietal lobe. There are small areas of hyperdensity within the infarct suspicious for tiny petechial hemorrhage common but without malignant hemorrhage hemorrhagic transformation or hematoma formation. There is no mass effect. The other acute infarcts seen on the recent brain MRI are not well seen on the current study; however, there is no evidence of hemorrhagic transformation of the other infarcts. There is no evidence of new acute territorial infarct. There is no other evidence of acute intracranial hemorrhage or extra-axial fluid collection. The ventricles are stable in size.  Background chronic small-vessel ischemic change is stable. There is no mass lesion. There is no mass effect or midline shift. Vascular: There is calcification of the bilateral carotid siphons. Skull: Normal. Negative for fracture or focal lesion. Sinuses/Orbits: The imaged paranasal sinuses are clear. Bilateral lens implants are in place. The globes and orbits are otherwise unremarkable. Other: None. IMPRESSION: 1. Evolving right parietal infarct with probable tiny petechial hemorrhage but no hematoma formation or mass effect. The other smaller infarcts seen on the recent MRI are not well seen on the current study. 2. No new acute intracranial pathology. Electronically Signed   By: Valetta Mole M.D.   On: 02/04/2022 14:05   ECHOCARDIOGRAM COMPLETE BUBBLE STUDY  Result Date: 01/31/2022    ECHOCARDIOGRAM REPORT   Patient Name:   Katie Bell Date of Exam: 01/31/2022 Medical Rec #:  [id number]        Height:       60.0 in Accession #:    [id number]       Weight:       110.0 lb Date of Birth:  [birth year]         BSA:          1.448 m Patient Age:    67 years         BP:           117/58 mmHg Patient Gender: F  HR:           89 bpm. Exam Location:  [facility] Procedure: 2D Echo, Cardiac Doppler, Color Doppler and Saline Contrast Bubble            Study Indications:     Stroke 434.91 / I63.9  History:         Patient has no prior history of Echocardiogram examinations.                  Risk Factors:Hypertension.  Sonographer:     Sherrie Sport Referring Phys:  [id number] Sidney Ace Diagnosing Phys: Kate Sable MD IMPRESSIONS  1. Left ventricular ejection fraction, by estimation, is 55 to 60%. The left ventricle has normal function. The left ventricle has no regional wall motion abnormalities. Left ventricular diastolic parameters are indeterminate.  2. Right ventricular systolic function is normal. The right ventricular size is normal.  3. The mitral valve is normal in structure. No evidence of  mitral valve regurgitation.  4. The aortic valve is tricuspid. Aortic valve regurgitation is not visualized. Aortic valve sclerosis is present, with no evidence of aortic valve stenosis.  5. The inferior vena cava is normal in size with greater than 50% respiratory variability, suggesting right atrial pressure of 3 mmHg.  6. Agitated saline contrast bubble study was negative, with no evidence of any interatrial shunt. FINDINGS  Left Ventricle: Left ventricular ejection fraction, by estimation, is 55 to 60%. The left ventricle has normal function. The left ventricle has no regional wall motion abnormalities. The left ventricular internal cavity size was normal in size. There is  no left ventricular hypertrophy. Left ventricular diastolic parameters are indeterminate. Right Ventricle: The right ventricular size is normal. No increase in right ventricular wall thickness. Right ventricular systolic function is normal. Left Atrium: Left atrial size was normal in size. Right Atrium: Right atrial size was normal in size. Pericardium: There is no evidence of pericardial effusion. Mitral Valve: The mitral valve is normal in structure. No evidence of mitral valve regurgitation. Tricuspid Valve: The tricuspid valve is normal in structure. Tricuspid valve regurgitation is trivial. Aortic Valve: The aortic valve is tricuspid. Aortic valve regurgitation is not visualized. Aortic valve sclerosis is present, with no evidence of aortic valve stenosis. Aortic valve mean gradient measures 3.0 mmHg. Aortic valve peak gradient measures 5.0  mmHg. Aortic valve area, by VTI measures 1.87 cm. Pulmonic Valve: The pulmonic valve was not well visualized. Pulmonic valve regurgitation is not visualized. Aorta: The aortic root is normal in size and structure. Venous: The inferior vena cava is normal in size with greater than 50% respiratory variability, suggesting right atrial pressure of 3 mmHg. IAS/Shunts: The interatrial septum appears to be  lipomatous. No atrial level shunt detected by color flow Doppler. Agitated saline contrast was given intravenously to evaluate for intracardiac shunting. Agitated saline contrast bubble study was negative,  with no evidence of any interatrial shunt.  LEFT VENTRICLE PLAX 2D LVIDd:         3.60 cm   Diastology LVIDs:         2.20 cm   LV e' medial:    10.70 cm/s LV PW:         1.00 cm   LV E/e' medial:  6.7 LV IVS:        1.10 cm   LV e' lateral:   10.80 cm/s LVOT diam:     2.00 cm   LV E/e' lateral: 6.6 LV SV:  36 LV SV Index:   25 LVOT Area:     3.14 cm  LEFT ATRIUM             Index        RIGHT ATRIUM           Index LA diam:        3.60 cm 2.49 cm/m   RA Area:     12.90 cm LA Vol (A2C):   23.9 ml 16.51 ml/m  RA Volume:   31.90 ml  22.03 ml/m LA Vol (A4C):   17.1 ml 11.81 ml/m LA Biplane Vol: 21.9 ml 15.13 ml/m  AORTIC VALVE AV Area (Vmax):    1.81 cm AV Area (Vmean):   1.77 cm AV Area (VTI):     1.87 cm AV Vmax:           112.00 cm/s AV Vmean:          77.000 cm/s AV VTI:            0.192 m AV Peak Grad:      5.0 mmHg AV Mean Grad:      3.0 mmHg LVOT Vmax:         64.50 cm/s LVOT Vmean:        43.500 cm/s LVOT VTI:          0.114 m LVOT/AV VTI ratio: 0.59  AORTA Ao Root diam: 2.90 cm MITRAL VALVE                TRICUSPID VALVE MV Area (PHT): 4.86 cm     TR Peak grad:   17.6 mmHg MV Decel Time: 156 msec     TR Vmax:        210.00 cm/s MV E velocity: 71.30 cm/s MV A velocity: 126.00 cm/s  SHUNTS MV E/A ratio:  0.57         Systemic VTI:  0.11 m                             Systemic Diam: 2.00 cm Kate Sable MD Electronically signed by Kate Sable MD Signature Date/Time: 01/31/2022/5:56:50 PM    Final    US Venous Img Lower Bilateral (DVT)  Result Date: 01/31/2022 CLINICAL DATA:  Recent surgery.  Elevated D-dimer. EXAM: BILATERAL LOWER EXTREMITY VENOUS DOPPLER ULTRASOUND TECHNIQUE: Gray-scale sonography with graded compression, as well as color Doppler and duplex ultrasound were  performed to evaluate the lower extremity deep venous systems from the level of the common femoral vein and including the common femoral, femoral, profunda femoral, popliteal and calf veins including the posterior tibial, peroneal and gastrocnemius veins when visible. The superficial great saphenous vein was also interrogated. Spectral Doppler was utilized to evaluate flow at rest and with distal augmentation maneuvers in the common femoral, femoral and popliteal veins. COMPARISON:  None Available. FINDINGS: RIGHT LOWER EXTREMITY Common Femoral Vein: No evidence of thrombus. Normal compressibility, respiratory phasicity and response to augmentation. Saphenofemoral Junction: No evidence of thrombus. Normal compressibility and flow on color Doppler imaging. Profunda Femoral Vein: No evidence of thrombus. Normal compressibility and flow on color Doppler imaging. Femoral Vein: No evidence of thrombus. Normal compressibility, respiratory phasicity and response to augmentation. Popliteal Vein: No evidence of thrombus. Normal compressibility, respiratory phasicity and response to augmentation. Calf Veins: Positive for thrombus. There is 1 peroneal vein that is not compressible and contains thrombus. The posterior tibial veins appear to be patent with normal compressibility.  Other Findings:  None. LEFT LOWER EXTREMITY Common Femoral Vein: No evidence of thrombus. Normal compressibility, respiratory phasicity and response to augmentation. Saphenofemoral Junction: No evidence of thrombus. Normal compressibility and flow on color Doppler imaging. Profunda Femoral Vein: No evidence of thrombus. Normal compressibility and flow on color Doppler imaging. Femoral Vein: No evidence of thrombus. Normal compressibility, respiratory phasicity and response to augmentation. Popliteal Vein: No evidence of thrombus. Normal compressibility, respiratory phasicity and response to augmentation. Calf Veins: Positive for thrombus. There is a non  compressible posterior tibial vein compatible with DVT. The second posterior tibial vein appears to be patent with normal compressibility. Left peroneal veins demonstrate normal compressibility and color Doppler flow. Other Findings:  None. IMPRESSION: 1. Positive for bilateral calf deep vein thrombosis. There is thrombus involving 1 right peroneal vein and 1 left posterior tibial vein. Electronically Signed   By: Markus Daft M.D.   On: 01/31/2022 09:45   MR ANGIO HEAD WO CONTRAST  Result Date: 01/31/2022 CLINICAL DATA:  81 year old female with neurologic deficit and patchy right greater than left hemisphere infarcts on MRI yesterday. EXAM: MRA HEAD WITHOUT CONTRAST TECHNIQUE: Angiographic images of the Circle of Willis were acquired using MRA technique without intravenous contrast. COMPARISON:  Brain MRI 01/30/2022. Neck MRA today reported separately. FINDINGS: Study is moderately degraded by motion artifact despite repeated imaging attempts. Anterior circulation: Antegrade flow in both ICA siphons, and both carotid termini appear patent, but supraclinoid ICA detail is degraded. MCA and ACA origins appear patent, but otherwise the branch detail is degraded. Posterior circulation: Antegrade flow in the posterior circulation with dominant appearing distal left vertebral artery. No distal vertebral or vertebrobasilar junction stenosis. Patent basilar artery without evidence of stenosis, although there is decreased detail of the basilar tip. PCA origins are patent. A right posterior communicating artery is visible. P1 and P2 segments appear patent but otherwise PCA branch detail is degraded. Anatomic variants: None significant are apparent. Other: No intracranial mass effect or ventriculomegaly is evident. IMPRESSION: Limited intracranial MRA. Patent ICA siphons, and no evidence of a posterior circulation large vessel occlusion. But no other circle-of-Willis branch detail. Electronically Signed   By: Genevie Ann M.D.    On: 01/31/2022 09:20   MR ANGIO NECK WO CONTRAST  Result Date: 01/31/2022 CLINICAL DATA:  81 year old female with neurologic deficit and patchy right greater than left hemisphere infarcts on MRI yesterday. EXAM: MRA NECK WITHOUT CONTRAST TECHNIQUE: Angiographic images of the neck were acquired using MRA technique without intravenous contrast. Carotid stenosis measurements (when applicable) are obtained utilizing NASCET criteria, using the distal internal carotid diameter as the denominator. COMPARISON:  Brain MRI 01/30/2022. FINDINGS: Study is moderately degraded by motion artifact despite repeated imaging attempts. Evidence of a 3 vessel arch configuration. Antegrade flow in both cervical carotid arteries throughout the neck and to the skull base. Degraded detail of both carotid bifurcations, but no carotid flow gaps to suggest high-grade stenosis. Similarly, degraded detail of both cervical vertebral arteries which demonstrate maintained antegrade flow signal to the skull base. IMPRESSION: Limited noncontrast neck MRA demonstrating patent cervical carotid and vertebral arteries. No high-grade stenosis is apparent. Electronically Signed   By: Genevie Ann M.D.   On: 01/31/2022 09:11   MR BRAIN W WO CONTRAST  Result Date: 01/30/2022 CLINICAL DATA:  Neuro deficit, acute, stroke suspected. EXAM: MRI HEAD WITHOUT AND WITH CONTRAST TECHNIQUE: Multiplanar, multiecho pulse sequences of the brain and surrounding structures were obtained without and with intravenous contrast. CONTRAST:  81m GADAVIST GADOBUTROL  1 MMOL/ML IV SOLN COMPARISON:  Head CT January 31, 2019. FINDINGS: The study is significantly degraded by motion. Brain: Areas of restricted diffusion are seen in the bilateral frontoparietal regions and right temporal lobe. The largest area of restricted diffusion is in the right parietal lobe where there is also interspersed areas of vasogenic edema with sulcal effacement. These areas show prominent  hyperintensity on T2/FLAIR and are suggestive of subacute infarcts. No significant mass effect or midline shift. No evidence of hemorrhagic transformation. No acute hemorrhage, hydrocephalus, extra-axial collection or mass lesion. No evidence of abnormal focus of contrast enhancement, although postcontrast images are significantly degraded by motion. Vascular: Normal flow voids. Skull and upper cervical spine: Normal marrow signal. Sinuses/Orbits: Negative. Other: None. IMPRESSION: Areas of restricted diffusion in the bilateral frontoparietal regions and right temporal lobe consistent with subacute infarcts. No evidence of hemorrhagic transformation or significant mass effect. Findings are concerning for embolic etiology. Electronically Signed   By: Pedro Earls M.D.   On: 01/30/2022 16:42   NM Pulmonary Perfusion  Result Date: 01/30/2022 CLINICAL DATA:  Chest pain, fall EXAM: NUCLEAR MEDICINE PERFUSION LUNG SCAN TECHNIQUE: Perfusion images were obtained in multiple projections after intravenous injection of radiopharmaceutical. Ventilation scans intentionally deferred if perfusion scan and chest x-ray adequate for interpretation during COVID 19 epidemic. RADIOPHARMACEUTICALS:  4.24 mCi Tc-13mMAA IV COMPARISON:  Chest radiographs done earlier today FINDINGS: There are no discrete wedge-shaped perfusion defects. There are ill-defined areas of decreased uptake in mid and lower lung fields, more so in the left side. IMPRESSION: Low probability for pulmonary embolism. Electronically Signed   By: PElmer PickerM.D.   On: 01/30/2022 16:30   CT HEAD WO CONTRAST (5MM)  Result Date: 01/30/2022 CLINICAL DATA:  Mental status change. EXAM: CT HEAD WITHOUT CONTRAST TECHNIQUE: Contiguous axial images were obtained from the base of the skull through the vertex without intravenous contrast. RADIATION DOSE REDUCTION: This exam was performed according to the departmental dose-optimization program  which includes automated exposure control, adjustment of the mA and/or kV according to patient size and/or use of iterative reconstruction technique. COMPARISON:  01/24/22 CT Brain FINDINGS: Limitations: Motion degraded exam. Brain: Compared to 01/24/2022 CT head, there is a new infarct in the right parietal lobe. No evidence of hemorrhage. No extra-axial fluid collection. No hydrocephalus. Vascular: No hyperdense vessel or unexpected calcification. Skull: Normal. Negative for fracture or focal lesion. Sinuses/Orbits: No acute finding. Other: None. IMPRESSION: New right parietal lobe infarct. No evidence of hemorrhage. These results will be called to the ordering clinician or representative by the Radiologist Assistant, and communication documented in the PACS or [employer] Electronically Signed   By: HMarin RobertsM.D.   On: 01/30/2022 09:51   DG Chest Port 1 View  Result Date: 01/30/2022 CLINICAL DATA:  81year old female with shortness of breath. EXAM: PORTABLE CHEST 1 VIEW COMPARISON:  Portable chest 01/24/2022 and earlier. FINDINGS: Portable AP upright view at 0547 hours. Larger lung volumes. Mediastinal contours are within normal limits. Visualized tracheal air column is within normal limits. Allowing for portable technique the lungs are clear. No pneumothorax or pleural effusion. No acute osseous abnormality identified. Negative visible bowel gas. IMPRESSION: No acute cardiopulmonary abnormality. Electronically Signed   By: HGenevie AnnM.D.   On: 01/30/2022 06:56   DG HIP UNILAT WITH PELVIS 2-3 VIEWS RIGHT  Result Date: 01/25/2022 CLINICAL DATA:  Intramedullary nail of right hip. Intraoperative fluoroscopy. EXAM: DG HIP (WITH OR WITHOUT PELVIS) 2-3V RIGHT COMPARISON:  Right hip radiographs 01/24/2022 FINDINGS: Images were performed intraoperatively without the presence of a radiologist. Interval cephalomedullary nail fixation of the previously seen right intertrochanteric fracture. Improved now  normal alignment. No evidence of hardware complication. Total fluoroscopy images: 2 Total fluoroscopy time: 35 seconds Total dose: Radiation Exposure Index (as provided by the fluoroscopic device): 3.189 mGy air Kerma Please see intraoperative findings for further detail. IMPRESSION: Intraoperative fluoroscopy for right intertrochanteric fracture fixation. Electronically Signed   By: Yvonne Kendall M.D.   On: 01/25/2022 18:34   DG C-Arm 1-60 Min-No Report  Result Date: 01/25/2022 Fluoroscopy was utilized by the requesting physician.  No radiographic interpretation.   CT Head Wo Contrast  Result Date: 01/24/2022 CLINICAL DATA:  Fall EXAM: CT HEAD WITHOUT CONTRAST CT CERVICAL SPINE WITHOUT CONTRAST TECHNIQUE: Multidetector CT imaging of the head and cervical spine was performed following the standard protocol without intravenous contrast. Multiplanar CT image reconstructions of the cervical spine were also generated. RADIATION DOSE REDUCTION: This exam was performed according to the departmental dose-optimization program which includes automated exposure control, adjustment of the mA and/or kV according to patient size and/or use of iterative reconstruction technique. COMPARISON:  None Available. FINDINGS: CT HEAD FINDINGS Brain: No evidence of acute infarction, hemorrhage, hydrocephalus, extra-axial collection or mass lesion/mass effect. Subcortical white matter and periventricular small vessel ischemic changes. Vascular: Intracranial atherosclerosis. Skull: Normal. Negative for fracture or focal lesion. Sinuses/Orbits: The visualized paranasal sinuses are essentially clear. The mastoid air cells are unopacified. Other: None. CT CERVICAL SPINE FINDINGS Alignment: Normal cervical lordosis. Skull base and vertebrae: No acute fracture. No primary bone lesion or focal pathologic process. Soft tissues and spinal canal: No prevertebral fluid or swelling. No visible canal hematoma. Disc levels: Very mild degenerative  changes of the mid/lower cervical spine. Spinal canal is patent. Upper chest: Visualized lung apices are clear. Other: Visualized thyroid is unremarkable. IMPRESSION: No evidence of acute intracranial abnormality. Small vessel ischemic changes. No traumatic injury to the cervical spine. Electronically Signed   By: Julian Hy M.D.   On: 01/24/2022 20:30   CT Cervical Spine Wo Contrast  Result Date: 01/24/2022 CLINICAL DATA:  Fall EXAM: CT HEAD WITHOUT CONTRAST CT CERVICAL SPINE WITHOUT CONTRAST TECHNIQUE: Multidetector CT imaging of the head and cervical spine was performed following the standard protocol without intravenous contrast. Multiplanar CT image reconstructions of the cervical spine were also generated. RADIATION DOSE REDUCTION: This exam was performed according to the departmental dose-optimization program which includes automated exposure control, adjustment of the mA and/or kV according to patient size and/or use of iterative reconstruction technique. COMPARISON:  None Available. FINDINGS: CT HEAD FINDINGS Brain: No evidence of acute infarction, hemorrhage, hydrocephalus, extra-axial collection or mass lesion/mass effect. Subcortical white matter and periventricular small vessel ischemic changes. Vascular: Intracranial atherosclerosis. Skull: Normal. Negative for fracture or focal lesion. Sinuses/Orbits: The visualized paranasal sinuses are essentially clear. The mastoid air cells are unopacified. Other: None. CT CERVICAL SPINE FINDINGS Alignment: Normal cervical lordosis. Skull base and vertebrae: No acute fracture. No primary bone lesion or focal pathologic process. Soft tissues and spinal canal: No prevertebral fluid or swelling. No visible canal hematoma. Disc levels: Very mild degenerative changes of the mid/lower cervical spine. Spinal canal is patent. Upper chest: Visualized lung apices are clear. Other: Visualized thyroid is unremarkable. IMPRESSION: No evidence of acute intracranial  abnormality. Small vessel ischemic changes. No traumatic injury to the cervical spine. Electronically Signed   By: Julian Hy M.D.   On: 01/24/2022 20:30  DG Chest 1 View  Result Date: 01/24/2022 CLINICAL DATA:  Pt fell backwards off of first stair.Pt complaining of right hip pain and refuses any attempt to adjust for positioning. Best images obtainable EXAM: CHEST  1 VIEW COMPARISON:  12/18/2019 FINDINGS: Relatively low lung volumes. No confluent airspace infiltrate or overt edema. Heart size and mediastinal contours are within normal limits. Aortic Atherosclerosis (ICD10-170.0). No effusion. Visualized bones unremarkable. IMPRESSION: No acute cardiopulmonary disease. Electronically Signed   By: Lucrezia Europe M.D.   On: 01/24/2022 17:01   DG Hip Unilat  With Pelvis 2-3 Views Right  Result Date: 01/24/2022 CLINICAL DATA:  Pain post fall EXAM: DG HIP (WITH OR WITHOUT PELVIS) 2-3V RIGHT COMPARISON:  CT 07/12/2020 FINDINGS: Mildly comminuted intertrochanteric fracture of the proximal right femur with varus deformity and foreshortening. No dislocation. Bony pelvis intact. Surgical clips in the left pelvis. Mild spondylitic changes in the visualized lower lumbar spine. IMPRESSION: Mildly comminuted intertrochanteric right femur fracture. Electronically Signed   By: Lucrezia Europe M.D.   On: 01/24/2022 16:50      Labs: BNP (last 3 results) No results for input(s): "BNP" in the last 8760 hours. Basic Metabolic Panel: Recent Labs  Lab 02/16/22 0352 02/17/22 0459 02/18/22 0532 02/19/22 0624 02/20/22 0540  NA 149* 146* 141 142 145  K 3.7 3.5 3.7 3.7 3.2*  CL 116* 116* 111 112* 116*  CO2 '25 23 23 24 25  '$ GLUCOSE 105* 108* 141* 113* 91  BUN '17 15 13 11 14  '$ CREATININE 0.73 0.89 0.81 0.89 0.79  CALCIUM 8.9 8.7* 8.6* 8.5* 8.3*  MG 2.1 2.1 1.9 1.9 2.0   Liver Function Tests: Recent Labs  Lab 02/13/22 1606  AST 24  ALT 18  ALKPHOS 102  BILITOT 1.0  PROT 7.5  ALBUMIN 3.3*   No results for  input(s): "LIPASE", "AMYLASE" in the last 168 hours. No results for input(s): "AMMONIA" in the last 168 hours. CBC: Recent Labs  Lab 02/13/22 1606 02/14/22 0513 02/16/22 0352 02/17/22 0459 02/18/22 0532 02/19/22 0624 02/20/22 0540  WBC 13.5*   < > 6.8 6.4 8.1 4.9 4.9  NEUTROABS 11.0*  --   --   --   --   --   --   HGB 11.2*   < > 9.6* 9.8* 9.9* 10.0* 9.4*  HCT 36.7   < > 30.7* 31.5* 31.9* 31.4* 30.2*  MCV 101.7*   < > 102.3* 100.0 101.6* 98.4 101.3*  PLT 517*   < > 281 240 221 183 165   < > = values in this interval not displayed.   Cardiac Enzymes: No results for input(s): "CKTOTAL", "CKMB", "CKMBINDEX", "TROPONINI" in the last 168 hours. BNP: Invalid input(s): "POCBNP" CBG: No results for input(s): "GLUCAP" in the last 168 hours. D-Dimer No results for input(s): "DDIMER" in the last 72 hours. Hgb A1c No results for input(s): "HGBA1C" in the last 72 hours. Lipid Profile No results for input(s): "CHOL", "HDL", "LDLCALC", "TRIG", "CHOLHDL", "LDLDIRECT" in the last 72 hours. Thyroid function studies No results for input(s): "TSH", "T4TOTAL", "T3FREE", "THYROIDAB" in the last 72 hours.  Invalid input(s): "FREET3" Anemia work up No results for input(s): "VITAMINB12", "FOLATE", "FERRITIN", "TIBC", "IRON", "RETICCTPCT" in the last 72 hours. Urinalysis    Component Value Date/Time   COLORURINE YELLOW (A) 02/13/2022 1617   APPEARANCEUR HAZY (A) 02/13/2022 1617   LABSPEC 1.016 02/13/2022 1617   PHURINE 5.0 02/13/2022 1617   GLUCOSEU NEGATIVE 02/13/2022 1617   HGBUR NEGATIVE 02/13/2022 1617  BILIRUBINUR NEGATIVE 02/13/2022 1617   BILIRUBINUR negative 12/14/2019 1159   BILIRUBINUR negative 11/18/2018 1023   KETONESUR 5 (A) 02/13/2022 1617   PROTEINUR 30 (A) 02/13/2022 1617   UROBILINOGEN 0.2 12/14/2019 1159   NITRITE NEGATIVE 02/13/2022 1617   LEUKOCYTESUR SMALL (A) 02/13/2022 1617   Sepsis Labs Recent Labs  Lab 02/17/22 0459 02/18/22 0532 02/19/22 0624  02/20/22 0540  WBC 6.4 8.1 4.9 4.9   Microbiology Recent Results (from the past 240 hour(s))  Urine Culture     Status: Abnormal   Collection Time: 02/13/22  4:17 PM   Specimen: Urine, Random  Result Value Ref Range Status   Specimen Description   Final    URINE, RANDOM Performed at [facility], [address] [place], [place] [address]    Special Requests   Final    NONE Performed at [facility], [address] [place], [place] [address]    Culture (A)  Final    >=100,000 COLONIES/mL ENTEROCOCCUS FAECIUM VANCOMYCIN RESISTANT ENTEROCOCCUS ISOLATED    Report Status 02/15/2022 FINAL  Final   Organism ID, Bacteria ENTEROCOCCUS FAECIUM (A)  Final      Susceptibility   Enterococcus faecium - MIC*    AMPICILLIN >=32 RESISTANT Resistant     NITROFURANTOIN 32 SENSITIVE Sensitive     VANCOMYCIN >=32 RESISTANT Resistant     LINEZOLID 2 SENSITIVE Sensitive     * >=100,000 COLONIES/mL ENTEROCOCCUS FAECIUM  Blood Culture (routine x 2)     Status: None   Collection Time: 02/13/22  4:59 PM   Specimen: BLOOD  Result Value Ref Range Status   Specimen Description BLOOD LEFT ANTECUBITAL  Final   Special Requests   Final    BOTTLES DRAWN AEROBIC AND ANAEROBIC Blood Culture adequate volume   Culture   Final    NO GROWTH 5 DAYS Performed at [facility], [address] [place], [place] [address]    Report Status 02/18/2022 FINAL  Final  Culture, blood (Routine X 2) w Reflex to ID Panel     Status: None   Collection Time: 02/15/22 12:42 AM   Specimen: BLOOD  Result Value Ref Range Status   Specimen Description BLOOD BLOOD LEFT HAND  Final   Special Requests   Final    BOTTLES DRAWN AEROBIC AND ANAEROBIC Blood Culture adequate volume   Culture   Final    NO GROWTH 5 DAYS Performed at [facility], [address], [place], [place] [address]    Report Status 02/20/2022 FINAL  Final     Total time spend on discharging this patient, including  the last patient exam, discussing the hospital stay, instructions for ongoing care as it relates to all pertinent caregivers, as well as preparing the medical discharge records, prescriptions, and/or referrals as applicable, is 35 minutes.    Enzo Bi, MD  [facility] 02/20/2022, 2:07 PM

## 2022-02-20 NOTE — Progress Notes (Signed)
   02/20/22 1400  Medical Necessity for Transport Certificate --- IF THIS TRANSPORT IS ROUND TRIP OR SCHEDULED AND REPEATED, A PHYSICIAN MUST COMPLETE THIS FORM  Transport from: Teacher, English as a foreign language) Doctor, hospital to (Location) Other (Comment) ([place])  Did the patient arrive from a [place], [place] or Group Home? Yes  Care Facility Name [facility]  Is this the closest appropriate facility? Yes  Date of Transport Service 02/20/22  Name of [facility]  Round Trip Transport? No  Reason for Transport Discharge  Is this a hospice patient? No  Describe the Medical Condition hip fracture  Q1 Are ALL the following "true"? 1. Patient unable to get up from bed without assistance  AND  2. Unable to ambulate  AND  3. Unable to sit in a chair, including wheelchair. Yes  Q2 Could the patient be transported safely by other means of transportation (I.E., wheelchair van)? No  Q3 Please check any of the following conditions that apply at the time of transport: Altered mental status;Risk of injury to self and/or others;Requires immobilization of fracture or possible fracture  Electronic Signature Colen Darling, LMSW  Credentials DP  Date Signed 02/20/22

## 2022-02-20 NOTE — Care Management Important Message (Signed)
Important Message  Patient Details  Name: Katie Bell MRN: [id number] Date of Birth: [birth year]   Medicare Important Message Given:  Yes     Juliann Pulse A Natanel Snavely 02/20/2022, 11:15 AM

## 2022-02-21 ENCOUNTER — Emergency Department (HOSPITAL_COMMUNITY)
Admission: EM | Admit: 2022-02-21 | Discharge: 2022-02-21 | Disposition: A | Discharge status: Skilled Nursing Facility | Payer: Medicare HMO | Attending: Emergency Medicine | Admitting: Emergency Medicine

## 2022-02-21 ENCOUNTER — Encounter (HOSPITAL_COMMUNITY): Payer: Self-pay

## 2022-02-21 ENCOUNTER — Other Ambulatory Visit: Payer: Self-pay

## 2022-02-21 ENCOUNTER — Emergency Department (HOSPITAL_COMMUNITY): Payer: Medicare HMO

## 2022-02-21 DIAGNOSIS — R041 Hemorrhage from throat: Secondary | ICD-10-CM | POA: Insufficient documentation

## 2022-02-21 DIAGNOSIS — R339 Retention of urine, unspecified: Secondary | ICD-10-CM | POA: Diagnosis not present

## 2022-02-21 DIAGNOSIS — I700 Atherosclerosis of aorta: Secondary | ICD-10-CM | POA: Diagnosis not present

## 2022-02-21 DIAGNOSIS — Z85828 Personal history of other malignant neoplasm of skin: Secondary | ICD-10-CM | POA: Insufficient documentation

## 2022-02-21 DIAGNOSIS — I6330 Cerebral infarction due to thrombosis of unspecified cerebral artery: Secondary | ICD-10-CM | POA: Diagnosis not present

## 2022-02-21 DIAGNOSIS — K1379 Other lesions of oral mucosa: Secondary | ICD-10-CM

## 2022-02-21 DIAGNOSIS — E870 Hyperosmolality and hypernatremia: Secondary | ICD-10-CM | POA: Insufficient documentation

## 2022-02-21 DIAGNOSIS — G319 Degenerative disease of nervous system, unspecified: Secondary | ICD-10-CM | POA: Diagnosis not present

## 2022-02-21 DIAGNOSIS — S72001A Fracture of unspecified part of neck of right femur, initial encounter for closed fracture: Secondary | ICD-10-CM | POA: Diagnosis not present

## 2022-02-21 DIAGNOSIS — G9341 Metabolic encephalopathy: Secondary | ICD-10-CM | POA: Diagnosis not present

## 2022-02-21 DIAGNOSIS — I10 Essential (primary) hypertension: Secondary | ICD-10-CM | POA: Diagnosis not present

## 2022-02-21 DIAGNOSIS — S72141A Displaced intertrochanteric fracture of right femur, initial encounter for closed fracture: Secondary | ICD-10-CM | POA: Diagnosis not present

## 2022-02-21 DIAGNOSIS — W19XXXA Unspecified fall, initial encounter: Secondary | ICD-10-CM | POA: Insufficient documentation

## 2022-02-21 DIAGNOSIS — Z87891 Personal history of nicotine dependence: Secondary | ICD-10-CM | POA: Diagnosis not present

## 2022-02-21 DIAGNOSIS — M6281 Muscle weakness (generalized): Secondary | ICD-10-CM | POA: Diagnosis not present

## 2022-02-21 DIAGNOSIS — Y92129 Unspecified place in nursing home as the place of occurrence of the external cause: Secondary | ICD-10-CM | POA: Diagnosis not present

## 2022-02-21 DIAGNOSIS — L7622 Postprocedural hemorrhage and hematoma of skin and subcutaneous tissue following other procedure: Secondary | ICD-10-CM | POA: Diagnosis not present

## 2022-02-21 DIAGNOSIS — F0390 Unspecified dementia without behavioral disturbance: Secondary | ICD-10-CM | POA: Diagnosis not present

## 2022-02-21 DIAGNOSIS — Z8673 Personal history of transient ischemic attack (TIA), and cerebral infarction without residual deficits: Secondary | ICD-10-CM | POA: Diagnosis not present

## 2022-02-21 DIAGNOSIS — R7881 Bacteremia: Secondary | ICD-10-CM | POA: Diagnosis not present

## 2022-02-21 DIAGNOSIS — S0990XA Unspecified injury of head, initial encounter: Secondary | ICD-10-CM | POA: Diagnosis not present

## 2022-02-21 DIAGNOSIS — Z452 Encounter for adjustment and management of vascular access device: Secondary | ICD-10-CM | POA: Diagnosis not present

## 2022-02-21 DIAGNOSIS — I82409 Acute embolism and thrombosis of unspecified deep veins of unspecified lower extremity: Secondary | ICD-10-CM | POA: Diagnosis not present

## 2022-02-21 DIAGNOSIS — S3993XA Unspecified injury of pelvis, initial encounter: Secondary | ICD-10-CM | POA: Diagnosis not present

## 2022-02-21 DIAGNOSIS — D649 Anemia, unspecified: Secondary | ICD-10-CM | POA: Diagnosis not present

## 2022-02-21 DIAGNOSIS — R069 Unspecified abnormalities of breathing: Secondary | ICD-10-CM | POA: Diagnosis not present

## 2022-02-21 DIAGNOSIS — R58 Hemorrhage, not elsewhere classified: Secondary | ICD-10-CM | POA: Diagnosis not present

## 2022-02-21 DIAGNOSIS — G9389 Other specified disorders of brain: Secondary | ICD-10-CM | POA: Diagnosis not present

## 2022-02-21 DIAGNOSIS — S299XXA Unspecified injury of thorax, initial encounter: Secondary | ICD-10-CM | POA: Diagnosis not present

## 2022-02-21 DIAGNOSIS — R609 Edema, unspecified: Secondary | ICD-10-CM | POA: Diagnosis not present

## 2022-02-21 DIAGNOSIS — R296 Repeated falls: Secondary | ICD-10-CM | POA: Diagnosis not present

## 2022-02-21 LAB — CBC WITH DIFFERENTIAL/PLATELET
Abs Immature Granulocytes: 0.02 10*3/uL (ref 0.00–0.07)
Basophils Absolute: 0 10*3/uL (ref 0.0–0.1)
Basophils Relative: 0 %
Eosinophils Absolute: 0 10*3/uL (ref 0.0–0.5)
Eosinophils Relative: 0 %
HCT: 34.7 % — ABNORMAL LOW (ref 36.0–46.0)
Hemoglobin: 10.9 g/dL — ABNORMAL LOW (ref 12.0–15.0)
Immature Granulocytes: 0 %
Lymphocytes Relative: 23 %
Lymphs Abs: 1.2 10*3/uL (ref 0.7–4.0)
MCH: 32.2 pg (ref 26.0–34.0)
MCHC: 31.4 g/dL (ref 30.0–36.0)
MCV: 102.4 fL — ABNORMAL HIGH (ref 80.0–100.0)
Monocytes Absolute: 0.7 10*3/uL (ref 0.1–1.0)
Monocytes Relative: 13 %
Neutro Abs: 3.4 10*3/uL (ref 1.7–7.7)
Neutrophils Relative %: 64 %
Platelets: 212 10*3/uL (ref 150–400)
RBC: 3.39 MIL/uL — ABNORMAL LOW (ref 3.87–5.11)
RDW: 14.4 % (ref 11.5–15.5)
WBC: 5.2 10*3/uL (ref 4.0–10.5)
nRBC: 0 % (ref 0.0–0.2)

## 2022-02-21 LAB — BASIC METABOLIC PANEL
Anion gap: 11 (ref 5–15)
BUN: 16 mg/dL (ref 8–23)
CO2: 22 mmol/L (ref 22–32)
Calcium: 8.6 mg/dL — ABNORMAL LOW (ref 8.9–10.3)
Chloride: 115 mmol/L — ABNORMAL HIGH (ref 98–111)
Creatinine, Ser: 0.89 mg/dL (ref 0.44–1.00)
GFR, Estimated: >60 mL/min (ref >60)
Glucose, Bld: 121 mg/dL — ABNORMAL HIGH (ref 70–99)
Potassium: 3.5 mmol/L (ref 3.5–5.1)
Sodium: 148 mmol/L — ABNORMAL HIGH (ref 135–145)

## 2022-02-21 MED ORDER — SODIUM CHLORIDE 0.9 % IV BOLUS
500.0000 mL | Freq: Once | INTRAVENOUS | Status: AC
  Administered 2022-02-21: 500 mL via INTRAVENOUS

## 2022-02-21 NOTE — ED Notes (Signed)
[facility] called for transport.  

## 2022-02-21 NOTE — ED Notes (Addendum)
Attempted to call report to [facility] with no answer x3, called unit manager shanekia to give report and unit manager states "I am calling administrator to make sure we can accept patient back and will call back".

## 2022-02-21 NOTE — ED Triage Notes (Addendum)
Patient BIB [facility] from [facility]. New resident as of last night there. Earlier today staff noted blood around and in the patients mouth. Unknown cause. No fall or injuries reported. Patient is on Eloquis. Patient has a foley catheter in place. Patient is alert and oriented x1 only to self.

## 2022-02-21 NOTE — Discharge Instructions (Signed)
You are seen in the emergency room today with bleeding from the mouth. There is no active bleeding.  Please use mouth swabs to keep the inside of the mouth moist.  I suspect that the blood occurred due to significant dryness.  The CT scan of the head and x-rays look normal.  The lab work does not appear to have changed much since yesterday.  I have given a small amount of IV fluids to help with rehydration.  Please continue all home medications and follow closely with your primary care physician.

## 2022-02-21 NOTE — ED Provider Notes (Signed)
Emergency Department Provider Note   I have reviewed the triage vital signs and the nursing notes.   HISTORY  Chief Complaint Blood in Mouth   HPI Katie Bell is a 81 y.o. female with past medical history reviewed below presents to the emergency department with bleeding from the mouth.  Patient was discharged yesterday to a skilled nursing facility after admit for metabolic encephalopathy.  Earlier in the month she had a mechanical fall with a right intertrochanteric hip fracture which was repaired surgically.  This was complicated by Klebsiella pneumonia and DVT along with stroke.  She was ultimately discharged to skilled nursing facility.  She does have baseline dementia.  EMS report that staff noticed blood coming from the mouth today and so called for medical evaluation.  Patient does not recall falling but history is limited by her dementia.  Level 5 caveat applies.   Past Medical History:  Diagnosis Date   Anxiety    Cancer ([place]) 2015   basal cell removed from leg   Depression    Episodic mood disorder ([place])    mostly anxiety--depression in the past   Gastric ulcer    History   GERD (gastroesophageal reflux disease)    History of blood transfusion    At [facility] appro 15 yrs ago   History of hiatal hernia    History of kidney stones 1960   Hypertension    Malignant melanoma ([place]) 07/14/2014   Left proximal post lat thigh. Breslow's 0.80m, Anatomic level II. Excised 08/03/2014   Osteoarthritis, hand    hand   Postmenopausal disorder    SVD (spontaneous vaginal delivery)    x 3    Review of Systems  Level 5 caveat: Dementia   ____________________________________________   PHYSICAL EXAM:  VITAL SIGNS: ED Triage Vitals  Enc Vitals Group     BP 02/21/22 1400 100/82     Pulse Rate 02/21/22 1400 97     Resp 02/21/22 1400 18     Temp 02/21/22 1402 98.6 F (37 C)     Temp Source 02/21/22 1402 Oral     SpO2 02/21/22 1400 100 %    Constitutional: Alert, talkative, and confused.  Eyes: Conjunctivae are normal.  Head: Atraumatic. Nose: No congestion/rhinnorhea. Mouth/Throat: Mucous membranes are dry. Some dried blood in the oropharynx.  Neck: No stridor.  Cardiovascular: Normal rate, regular rhythm. Good peripheral circulation. Grossly normal heart sounds.   Respiratory: Normal respiratory effort.  No retractions. Lungs CTAB. Gastrointestinal: Soft and nontender. No distention.  Musculoskeletal: No lower extremity tenderness nor edema. No gross deformities of extremities. Pain with ROM of the right hip.  Neurologic:  Normal speech and language but confused at baseline.  Skin:  Skin is warm, dry and intact. No rash noted.  ____________________________________________   LABS (all labs ordered are listed, but only abnormal results are displayed)  Labs Reviewed  BASIC METABOLIC PANEL - Abnormal; Notable for the following components:      Result Value   Sodium 148 (*)    Chloride 115 (*)    Glucose, Bld 121 (*)    Calcium 8.6 (*)    All other components within normal limits  CBC WITH DIFFERENTIAL/PLATELET - Abnormal; Notable for the following components:   RBC 3.39 (*)    Hemoglobin 10.9 (*)    HCT 34.7 (*)    MCV 102.4 (*)    All other components within normal limits   ____________________________________________  RADIOLOGY  DG Pelvis 1-2 Views  Result Date: 02/21/2022 CLINICAL DATA:  Recent fall EXAM: PELVIS - 1-2 VIEW COMPARISON:  02/13/2022 FINDINGS: Visualized pelvis intact. No diastasis. Recent right hip ORIF for an intertrochanteric fracture. Fracture lines remain visible. Stable left hip appearance. No significant interval change or acute finding by plain radiography IMPRESSION: Stable right hip ORIF for a recent right hip intertrochanteric fracture. No additional acute finding by plain radiography Electronically Signed   By: Jerilynn Mages.  Shick M.D.   On: 02/21/2022 15:42   CT Head Wo Contrast  Result  Date: 02/21/2022 CLINICAL DATA:  Head trauma, minor (Age >= 65y) EXAM: CT HEAD WITHOUT CONTRAST TECHNIQUE: Contiguous axial images were obtained from the base of the skull through the vertex without intravenous contrast. RADIATION DOSE REDUCTION: This exam was performed according to the departmental dose-optimization program which includes automated exposure control, adjustment of the mA and/or kV according to patient size and/or use of iterative reconstruction technique. COMPARISON:  CT head 02/13/2022 BRAIN: BRAIN Cerebral ventricle sizes are concordant with the degree of cerebral volume loss. Patchy and confluent areas of decreased attenuation are noted throughout the deep and periventricular white matter of the cerebral hemispheres bilaterally, compatible with chronic microvascular ischemic disease. Chronic right parietal infarction. No evidence of large-territorial acute infarction. No parenchymal hemorrhage. No mass lesion. No extra-axial collection. No mass effect or midline shift. No hydrocephalus. Basilar cisterns are patent. Vascular: No hyperdense vessel. Skull: No acute fracture or focal lesion. Sinuses/Orbits: Mild mucosal thickening of the right sphenoid sinus. Otherwise paranasal sinuses and mastoid air cells are clear. Bilateral lens replacement. Otherwise the orbits are unremarkable. Other: None. IMPRESSION: No acute intracranial abnormality. Electronically Signed   By: Iven Finn M.D.   On: 02/21/2022 15:41   DG Chest 2 View  Result Date: 02/21/2022 CLINICAL DATA:  Fall, trauma EXAM: CHEST - 2 VIEW COMPARISON:  02/13/2022 FINDINGS: Stable heart size and vascularity. Exam is rotated to the left. Right upper extremity PICC line tip projects at the lower SVC level. Lungs remain clear. No definite focal pneumonia, collapse or consolidation. Negative for edema, large effusion or pneumothorax. Aorta atherosclerotic. Degenerative changes of the spine. Bones are osteopenic. IMPRESSION: Stable  chest exam. No acute process by plain radiography. Electronically Signed   By: Jerilynn Mages.  Shick M.D.   On: 02/21/2022 15:40    ____________________________________________   PROCEDURES  Procedure(s) performed:   Procedures  None  ____________________________________________   INITIAL IMPRESSION / ASSESSMENT AND PLAN / ED COURSE  Pertinent labs & imaging results that were available during my care of the patient were reviewed by me and considered in my medical decision making (see chart for details).   This patient is Presenting for Evaluation of oral bleeding, which does require a range of treatment options, and is a complaint that involves a high risk of morbidity and mortality.  The Differential Diagnoses include anemia, AKI, anticoagulation, fall, oral injury, seizure, etc.  Critical Interventions-    Medications  sodium chloride 0.9 % bolus 500 mL (500 mLs Intravenous New Bag/Given 02/21/22 1553)    Reassessment after intervention: Symptoms improved.    I did obtain Additional Historical Information from EMS.   I decided to review pertinent External Data, and in summary patient discharged from the ED.    Clinical Laboratory Tests Ordered, included Mild hypernatremia with no AKI. Mild anemia at 10.9.   Radiologic Tests Ordered, included CT head, CXR, and pelvis x-ray. I independently interpreted the images and agree with radiology interpretation.   Cardiac Monitor Tracing which shows NSR.  Social Determinants of Health Risk patient is a former smoker.   Medical Decision Making: Summary:  Presents emergency department with dried blood in the mouth.  She has dry mucous membranes and I suspect this is causing her symptoms.  I do not see a sign of oral injury, active bleeding, sign of head injury or fall.  Patient unable to provide significant history due to her dementia and so do plan for work-up including basic labs, CT imaging, x-rays, IV fluids.  Reevaluation with update  and discussion with patient. She remains confused but cooperative and pleasant. No acute findings. Na of 148 up-trending slightly from yesterday. Given IVF and stable for d/c. Advised nursing facility to provide plenty of fluids and/or mouth swabs to keep the mucous membranes moist and prevent re-bleeding.   Disposition: discharge  ____________________________________________  FINAL CLINICAL IMPRESSION(S) / ED DIAGNOSES  Final diagnoses:  Oral bleeding    Note:  This document was prepared using Dragon voice recognition software and may include unintentional dictation errors.  Nanda Quinton, MD, [facility] Emergency Medicine    Nasirah Sachs, W[facility], MD 02/21/22 [phone]

## 2022-02-21 NOTE — ED Notes (Signed)
Unit manager called back and said okay to call [facility] for tansport

## 2022-02-22 ENCOUNTER — Ambulatory Visit: Payer: Medicare HMO | Attending: Infectious Diseases | Admitting: Infectious Diseases

## 2022-02-22 DIAGNOSIS — I82409 Acute embolism and thrombosis of unspecified deep veins of unspecified lower extremity: Secondary | ICD-10-CM | POA: Diagnosis not present

## 2022-02-22 DIAGNOSIS — R7881 Bacteremia: Secondary | ICD-10-CM | POA: Insufficient documentation

## 2022-02-22 DIAGNOSIS — E870 Hyperosmolality and hypernatremia: Secondary | ICD-10-CM | POA: Diagnosis not present

## 2022-02-22 DIAGNOSIS — R339 Retention of urine, unspecified: Secondary | ICD-10-CM | POA: Diagnosis not present

## 2022-02-22 DIAGNOSIS — I6340 Cerebral infarction due to embolism of unspecified cerebral artery: Secondary | ICD-10-CM | POA: Diagnosis not present

## 2022-02-22 DIAGNOSIS — S72001A Fracture of unspecified part of neck of right femur, initial encounter for closed fracture: Secondary | ICD-10-CM | POA: Diagnosis not present

## 2022-02-22 DIAGNOSIS — G9341 Metabolic encephalopathy: Secondary | ICD-10-CM | POA: Diagnosis not present

## 2022-02-22 DIAGNOSIS — B961 Klebsiella pneumoniae [K. pneumoniae] as the cause of diseases classified elsewhere: Secondary | ICD-10-CM

## 2022-02-22 DIAGNOSIS — I6330 Cerebral infarction due to thrombosis of unspecified cerebral artery: Secondary | ICD-10-CM | POA: Diagnosis not present

## 2022-02-22 DIAGNOSIS — R296 Repeated falls: Secondary | ICD-10-CM | POA: Diagnosis not present

## 2022-02-22 DIAGNOSIS — M6281 Muscle weakness (generalized): Secondary | ICD-10-CM | POA: Diagnosis not present

## 2022-02-22 NOTE — Progress Notes (Signed)
The purpose of this virtual visit is to provide medical care while limiting exposure to the novel coronavirus (COVID19) for both patient and office staff.   Consent was obtained for phone visit:  Yes.   Answered questions that patient had about telehealth interaction:  Yes.   I discussed the limitations, risks, security and privacy concerns of performing an evaluation and management service by telephone. I also discussed with the patient that there may be a patient responsible charge related to this service. The patient expressed understanding and agreed to proceed.   Patient Location: Home Provider Location:The purpose of this virtual visit is to provide medical care while limiting exposure to the novel coronavirus (COVID19) for both patient and office staff.   Consent was obtained for phone visit:  Yes.  ( video visit was changed to phone) Answered questions that patient had about telehealth interaction:  Yes.   I discussed the limitations, risks, security and privacy concerns of performing an evaluation and management service by telephone. I also discussed with the patient that there may be a patient responsible charge related to this service. The patient expressed understanding and agreed to proceed.   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

## 2022-02-23 DIAGNOSIS — J150 Pneumonia due to Klebsiella pneumoniae: Secondary | ICD-10-CM | POA: Diagnosis not present

## 2022-02-23 DIAGNOSIS — R4182 Altered mental status, unspecified: Secondary | ICD-10-CM | POA: Diagnosis not present

## 2022-02-23 DIAGNOSIS — R339 Retention of urine, unspecified: Secondary | ICD-10-CM | POA: Diagnosis not present

## 2022-02-23 DIAGNOSIS — R8271 Bacteriuria: Secondary | ICD-10-CM | POA: Diagnosis not present

## 2022-02-23 DIAGNOSIS — E870 Hyperosmolality and hypernatremia: Secondary | ICD-10-CM | POA: Diagnosis not present

## 2022-02-23 DIAGNOSIS — R7881 Bacteremia: Secondary | ICD-10-CM | POA: Diagnosis not present

## 2022-02-23 DIAGNOSIS — I10 Essential (primary) hypertension: Secondary | ICD-10-CM | POA: Diagnosis not present

## 2022-02-23 DIAGNOSIS — R338 Other retention of urine: Secondary | ICD-10-CM | POA: Diagnosis not present

## 2022-02-23 DIAGNOSIS — I82409 Acute embolism and thrombosis of unspecified deep veins of unspecified lower extremity: Secondary | ICD-10-CM | POA: Diagnosis not present

## 2022-02-23 DIAGNOSIS — G9341 Metabolic encephalopathy: Secondary | ICD-10-CM | POA: Diagnosis not present

## 2022-02-23 DIAGNOSIS — S72001A Fracture of unspecified part of neck of right femur, initial encounter for closed fracture: Secondary | ICD-10-CM | POA: Diagnosis not present

## 2022-02-23 DIAGNOSIS — I6330 Cerebral infarction due to thrombosis of unspecified cerebral artery: Secondary | ICD-10-CM | POA: Diagnosis not present

## 2022-02-23 DIAGNOSIS — Z86711 Personal history of pulmonary embolism: Secondary | ICD-10-CM | POA: Diagnosis not present

## 2022-02-23 DIAGNOSIS — Z8673 Personal history of transient ischemic attack (TIA), and cerebral infarction without residual deficits: Secondary | ICD-10-CM | POA: Diagnosis not present

## 2022-02-23 DIAGNOSIS — R296 Repeated falls: Secondary | ICD-10-CM | POA: Diagnosis not present

## 2022-02-23 DIAGNOSIS — B372 Candidiasis of skin and nail: Secondary | ICD-10-CM | POA: Diagnosis not present

## 2022-02-26 DIAGNOSIS — E870 Hyperosmolality and hypernatremia: Secondary | ICD-10-CM | POA: Diagnosis not present

## 2022-02-26 DIAGNOSIS — I6330 Cerebral infarction due to thrombosis of unspecified cerebral artery: Secondary | ICD-10-CM | POA: Diagnosis not present

## 2022-02-26 DIAGNOSIS — R339 Retention of urine, unspecified: Secondary | ICD-10-CM | POA: Diagnosis not present

## 2022-02-26 DIAGNOSIS — R7881 Bacteremia: Secondary | ICD-10-CM | POA: Diagnosis not present

## 2022-02-26 DIAGNOSIS — R296 Repeated falls: Secondary | ICD-10-CM | POA: Diagnosis not present

## 2022-02-26 DIAGNOSIS — I82409 Acute embolism and thrombosis of unspecified deep veins of unspecified lower extremity: Secondary | ICD-10-CM | POA: Diagnosis not present

## 2022-02-26 DIAGNOSIS — S72001A Fracture of unspecified part of neck of right femur, initial encounter for closed fracture: Secondary | ICD-10-CM | POA: Diagnosis not present

## 2022-02-26 DIAGNOSIS — R4182 Altered mental status, unspecified: Secondary | ICD-10-CM | POA: Diagnosis not present

## 2022-02-26 DIAGNOSIS — B372 Candidiasis of skin and nail: Secondary | ICD-10-CM | POA: Diagnosis not present

## 2022-02-27 DIAGNOSIS — S72001A Fracture of unspecified part of neck of right femur, initial encounter for closed fracture: Secondary | ICD-10-CM | POA: Diagnosis not present

## 2022-02-27 DIAGNOSIS — R338 Other retention of urine: Secondary | ICD-10-CM | POA: Diagnosis not present

## 2022-02-27 DIAGNOSIS — R7881 Bacteremia: Secondary | ICD-10-CM | POA: Diagnosis not present

## 2022-02-27 DIAGNOSIS — R4182 Altered mental status, unspecified: Secondary | ICD-10-CM | POA: Diagnosis not present

## 2022-02-27 DIAGNOSIS — E870 Hyperosmolality and hypernatremia: Secondary | ICD-10-CM | POA: Diagnosis not present

## 2022-02-27 DIAGNOSIS — I6330 Cerebral infarction due to thrombosis of unspecified cerebral artery: Secondary | ICD-10-CM | POA: Diagnosis not present

## 2022-02-27 DIAGNOSIS — G9341 Metabolic encephalopathy: Secondary | ICD-10-CM | POA: Diagnosis not present

## 2022-02-27 DIAGNOSIS — I82409 Acute embolism and thrombosis of unspecified deep veins of unspecified lower extremity: Secondary | ICD-10-CM | POA: Diagnosis not present

## 2022-02-27 DIAGNOSIS — Z8673 Personal history of transient ischemic attack (TIA), and cerebral infarction without residual deficits: Secondary | ICD-10-CM | POA: Diagnosis not present

## 2022-02-27 DIAGNOSIS — R8271 Bacteriuria: Secondary | ICD-10-CM | POA: Diagnosis not present

## 2022-02-27 DIAGNOSIS — B372 Candidiasis of skin and nail: Secondary | ICD-10-CM | POA: Diagnosis not present

## 2022-02-27 DIAGNOSIS — L89153 Pressure ulcer of sacral region, stage 3: Secondary | ICD-10-CM | POA: Diagnosis not present

## 2022-02-27 DIAGNOSIS — Z86711 Personal history of pulmonary embolism: Secondary | ICD-10-CM | POA: Diagnosis not present

## 2022-02-27 DIAGNOSIS — J150 Pneumonia due to Klebsiella pneumoniae: Secondary | ICD-10-CM | POA: Diagnosis not present

## 2022-02-27 DIAGNOSIS — R296 Repeated falls: Secondary | ICD-10-CM | POA: Diagnosis not present

## 2022-02-27 DIAGNOSIS — S41102A Unspecified open wound of left upper arm, initial encounter: Secondary | ICD-10-CM | POA: Diagnosis not present

## 2022-02-27 DIAGNOSIS — I10 Essential (primary) hypertension: Secondary | ICD-10-CM | POA: Diagnosis not present

## 2022-02-28 DIAGNOSIS — R4182 Altered mental status, unspecified: Secondary | ICD-10-CM | POA: Diagnosis not present

## 2022-02-28 DIAGNOSIS — B372 Candidiasis of skin and nail: Secondary | ICD-10-CM | POA: Diagnosis not present

## 2022-02-28 DIAGNOSIS — E870 Hyperosmolality and hypernatremia: Secondary | ICD-10-CM | POA: Diagnosis not present

## 2022-02-28 DIAGNOSIS — R296 Repeated falls: Secondary | ICD-10-CM | POA: Diagnosis not present

## 2022-02-28 DIAGNOSIS — I82409 Acute embolism and thrombosis of unspecified deep veins of unspecified lower extremity: Secondary | ICD-10-CM | POA: Diagnosis not present

## 2022-02-28 DIAGNOSIS — R339 Retention of urine, unspecified: Secondary | ICD-10-CM | POA: Diagnosis not present

## 2022-02-28 DIAGNOSIS — I6330 Cerebral infarction due to thrombosis of unspecified cerebral artery: Secondary | ICD-10-CM | POA: Diagnosis not present

## 2022-02-28 DIAGNOSIS — R7881 Bacteremia: Secondary | ICD-10-CM | POA: Diagnosis not present

## 2022-02-28 DIAGNOSIS — S72001A Fracture of unspecified part of neck of right femur, initial encounter for closed fracture: Secondary | ICD-10-CM | POA: Diagnosis not present

## 2022-03-02 DIAGNOSIS — R296 Repeated falls: Secondary | ICD-10-CM | POA: Diagnosis not present

## 2022-03-02 DIAGNOSIS — I82409 Acute embolism and thrombosis of unspecified deep veins of unspecified lower extremity: Secondary | ICD-10-CM | POA: Diagnosis not present

## 2022-03-02 DIAGNOSIS — R4182 Altered mental status, unspecified: Secondary | ICD-10-CM | POA: Diagnosis not present

## 2022-03-02 DIAGNOSIS — R339 Retention of urine, unspecified: Secondary | ICD-10-CM | POA: Diagnosis not present

## 2022-03-02 DIAGNOSIS — R7881 Bacteremia: Secondary | ICD-10-CM | POA: Diagnosis not present

## 2022-03-02 DIAGNOSIS — I6330 Cerebral infarction due to thrombosis of unspecified cerebral artery: Secondary | ICD-10-CM | POA: Diagnosis not present

## 2022-03-02 DIAGNOSIS — S72001A Fracture of unspecified part of neck of right femur, initial encounter for closed fracture: Secondary | ICD-10-CM | POA: Diagnosis not present

## 2022-03-02 DIAGNOSIS — E870 Hyperosmolality and hypernatremia: Secondary | ICD-10-CM | POA: Diagnosis not present

## 2022-03-02 DIAGNOSIS — B372 Candidiasis of skin and nail: Secondary | ICD-10-CM | POA: Diagnosis not present

## 2022-03-05 DIAGNOSIS — B372 Candidiasis of skin and nail: Secondary | ICD-10-CM | POA: Diagnosis not present

## 2022-03-05 DIAGNOSIS — R7881 Bacteremia: Secondary | ICD-10-CM | POA: Diagnosis not present

## 2022-03-05 DIAGNOSIS — R296 Repeated falls: Secondary | ICD-10-CM | POA: Diagnosis not present

## 2022-03-05 DIAGNOSIS — S72001A Fracture of unspecified part of neck of right femur, initial encounter for closed fracture: Secondary | ICD-10-CM | POA: Diagnosis not present

## 2022-03-05 DIAGNOSIS — E870 Hyperosmolality and hypernatremia: Secondary | ICD-10-CM | POA: Diagnosis not present

## 2022-03-05 DIAGNOSIS — I6330 Cerebral infarction due to thrombosis of unspecified cerebral artery: Secondary | ICD-10-CM | POA: Diagnosis not present

## 2022-03-05 DIAGNOSIS — G9341 Metabolic encephalopathy: Secondary | ICD-10-CM | POA: Diagnosis not present

## 2022-03-05 DIAGNOSIS — R4182 Altered mental status, unspecified: Secondary | ICD-10-CM | POA: Diagnosis not present

## 2022-03-05 DIAGNOSIS — I82409 Acute embolism and thrombosis of unspecified deep veins of unspecified lower extremity: Secondary | ICD-10-CM | POA: Diagnosis not present

## 2022-03-27 ENCOUNTER — Telehealth: Payer: Self-pay

## 2022-03-27 NOTE — Telephone Encounter (Signed)
-----   Message from Tsosie Billing, MD sent at 03/26/2022 11:10 AM EST ----- Can you check to see whether IV antibiotic was completed on 03/12/22 and PICC removed? thx

## 2022-03-27 NOTE — Telephone Encounter (Signed)
I called patient's son [name] to verify the SNF that patient is at and patient's son stated patient is now deceased.  Narayan Scull T Brooks Sailors
